# Patient Record
Sex: Female | Born: 1935 | Race: White | Hispanic: No | Smoking: Former smoker
Health system: Southern US, Community
[De-identification: ages and names within clinical notes are randomized; demographics above are authoritative.]

## PROBLEM LIST (undated history)

## (undated) DIAGNOSIS — E119 Type 2 diabetes mellitus without complications: Secondary | ICD-10-CM

## (undated) DIAGNOSIS — I739 Peripheral vascular disease, unspecified: Secondary | ICD-10-CM

## (undated) DIAGNOSIS — K219 Gastro-esophageal reflux disease without esophagitis: Secondary | ICD-10-CM

## (undated) DIAGNOSIS — G459 Transient cerebral ischemic attack, unspecified: Secondary | ICD-10-CM

## (undated) DIAGNOSIS — I10 Essential (primary) hypertension: Secondary | ICD-10-CM

## (undated) DIAGNOSIS — E785 Hyperlipidemia, unspecified: Secondary | ICD-10-CM

## (undated) DIAGNOSIS — N189 Chronic kidney disease, unspecified: Secondary | ICD-10-CM

## (undated) DIAGNOSIS — Z9989 Dependence on other enabling machines and devices: Secondary | ICD-10-CM

## (undated) DIAGNOSIS — M86671 Other chronic osteomyelitis, right ankle and foot: Secondary | ICD-10-CM

## (undated) DIAGNOSIS — G2581 Restless legs syndrome: Secondary | ICD-10-CM

## (undated) DIAGNOSIS — D509 Iron deficiency anemia, unspecified: Secondary | ICD-10-CM

## (undated) DIAGNOSIS — E559 Vitamin D deficiency, unspecified: Secondary | ICD-10-CM

## (undated) HISTORY — DX: Other chronic osteomyelitis, right ankle and foot: M86.671

## (undated) HISTORY — PX: ABDOMINAL HYSTERECTOMY: SHX81

## (undated) HISTORY — PX: ROTATOR CUFF REPAIR: SHX139

## (undated) HISTORY — PX: OTHER SURGICAL HISTORY: SHX169

## (undated) HISTORY — PX: CARPAL TUNNEL RELEASE: SHX101

## (undated) HISTORY — PX: DILATION AND CURETTAGE OF UTERUS: SHX78

## (undated) HISTORY — PX: COLONOSCOPY: SHX174

## (undated) HISTORY — PX: CERVICAL DISC SURGERY: SHX588

## (undated) HISTORY — PX: APPENDECTOMY: SHX54

## (undated) HISTORY — PX: CATARACT EXTRACTION, BILATERAL: SHX1313

---

## 2016-05-08 DIAGNOSIS — K219 Gastro-esophageal reflux disease without esophagitis: Secondary | ICD-10-CM | POA: Diagnosis not present

## 2016-05-08 DIAGNOSIS — G2581 Restless legs syndrome: Secondary | ICD-10-CM | POA: Diagnosis not present

## 2016-05-08 DIAGNOSIS — D509 Iron deficiency anemia, unspecified: Secondary | ICD-10-CM | POA: Diagnosis not present

## 2016-05-08 DIAGNOSIS — I10 Essential (primary) hypertension: Secondary | ICD-10-CM | POA: Diagnosis not present

## 2016-05-08 DIAGNOSIS — E1149 Type 2 diabetes mellitus with other diabetic neurological complication: Secondary | ICD-10-CM | POA: Diagnosis not present

## 2016-05-08 DIAGNOSIS — E785 Hyperlipidemia, unspecified: Secondary | ICD-10-CM | POA: Diagnosis not present

## 2016-05-08 DIAGNOSIS — I739 Peripheral vascular disease, unspecified: Secondary | ICD-10-CM | POA: Diagnosis not present

## 2016-05-13 DIAGNOSIS — E1042 Type 1 diabetes mellitus with diabetic polyneuropathy: Secondary | ICD-10-CM | POA: Diagnosis not present

## 2016-06-13 DIAGNOSIS — I129 Hypertensive chronic kidney disease with stage 1 through stage 4 chronic kidney disease, or unspecified chronic kidney disease: Secondary | ICD-10-CM | POA: Diagnosis not present

## 2016-06-13 DIAGNOSIS — E1151 Type 2 diabetes mellitus with diabetic peripheral angiopathy without gangrene: Secondary | ICD-10-CM | POA: Diagnosis not present

## 2016-06-13 DIAGNOSIS — E1140 Type 2 diabetes mellitus with diabetic neuropathy, unspecified: Secondary | ICD-10-CM | POA: Diagnosis not present

## 2016-06-13 DIAGNOSIS — E11621 Type 2 diabetes mellitus with foot ulcer: Secondary | ICD-10-CM | POA: Diagnosis not present

## 2016-06-13 DIAGNOSIS — L97521 Non-pressure chronic ulcer of other part of left foot limited to breakdown of skin: Secondary | ICD-10-CM | POA: Diagnosis not present

## 2016-06-16 DIAGNOSIS — L97521 Non-pressure chronic ulcer of other part of left foot limited to breakdown of skin: Secondary | ICD-10-CM | POA: Diagnosis not present

## 2016-06-16 DIAGNOSIS — E11621 Type 2 diabetes mellitus with foot ulcer: Secondary | ICD-10-CM | POA: Diagnosis not present

## 2016-06-16 DIAGNOSIS — E1140 Type 2 diabetes mellitus with diabetic neuropathy, unspecified: Secondary | ICD-10-CM | POA: Diagnosis not present

## 2016-06-16 DIAGNOSIS — I129 Hypertensive chronic kidney disease with stage 1 through stage 4 chronic kidney disease, or unspecified chronic kidney disease: Secondary | ICD-10-CM | POA: Diagnosis not present

## 2016-06-16 DIAGNOSIS — E1151 Type 2 diabetes mellitus with diabetic peripheral angiopathy without gangrene: Secondary | ICD-10-CM | POA: Diagnosis not present

## 2016-06-17 DIAGNOSIS — E1149 Type 2 diabetes mellitus with other diabetic neurological complication: Secondary | ICD-10-CM | POA: Diagnosis not present

## 2016-06-17 DIAGNOSIS — N189 Chronic kidney disease, unspecified: Secondary | ICD-10-CM | POA: Diagnosis not present

## 2016-06-17 DIAGNOSIS — G629 Polyneuropathy, unspecified: Secondary | ICD-10-CM | POA: Diagnosis not present

## 2016-06-17 DIAGNOSIS — Z79899 Other long term (current) drug therapy: Secondary | ICD-10-CM | POA: Diagnosis not present

## 2016-06-17 DIAGNOSIS — R296 Repeated falls: Secondary | ICD-10-CM | POA: Diagnosis not present

## 2016-06-17 DIAGNOSIS — L98499 Non-pressure chronic ulcer of skin of other sites with unspecified severity: Secondary | ICD-10-CM | POA: Diagnosis not present

## 2016-06-17 DIAGNOSIS — E785 Hyperlipidemia, unspecified: Secondary | ICD-10-CM | POA: Diagnosis not present

## 2016-06-19 DIAGNOSIS — E1140 Type 2 diabetes mellitus with diabetic neuropathy, unspecified: Secondary | ICD-10-CM | POA: Diagnosis not present

## 2016-06-19 DIAGNOSIS — L97521 Non-pressure chronic ulcer of other part of left foot limited to breakdown of skin: Secondary | ICD-10-CM | POA: Diagnosis not present

## 2016-06-19 DIAGNOSIS — I129 Hypertensive chronic kidney disease with stage 1 through stage 4 chronic kidney disease, or unspecified chronic kidney disease: Secondary | ICD-10-CM | POA: Diagnosis not present

## 2016-06-19 DIAGNOSIS — E1151 Type 2 diabetes mellitus with diabetic peripheral angiopathy without gangrene: Secondary | ICD-10-CM | POA: Diagnosis not present

## 2016-06-19 DIAGNOSIS — E11621 Type 2 diabetes mellitus with foot ulcer: Secondary | ICD-10-CM | POA: Diagnosis not present

## 2016-06-20 DIAGNOSIS — E1151 Type 2 diabetes mellitus with diabetic peripheral angiopathy without gangrene: Secondary | ICD-10-CM | POA: Diagnosis not present

## 2016-06-20 DIAGNOSIS — I129 Hypertensive chronic kidney disease with stage 1 through stage 4 chronic kidney disease, or unspecified chronic kidney disease: Secondary | ICD-10-CM | POA: Diagnosis not present

## 2016-06-20 DIAGNOSIS — E11621 Type 2 diabetes mellitus with foot ulcer: Secondary | ICD-10-CM | POA: Diagnosis not present

## 2016-06-20 DIAGNOSIS — L97521 Non-pressure chronic ulcer of other part of left foot limited to breakdown of skin: Secondary | ICD-10-CM | POA: Diagnosis not present

## 2016-06-20 DIAGNOSIS — E1140 Type 2 diabetes mellitus with diabetic neuropathy, unspecified: Secondary | ICD-10-CM | POA: Diagnosis not present

## 2016-06-23 DIAGNOSIS — L97521 Non-pressure chronic ulcer of other part of left foot limited to breakdown of skin: Secondary | ICD-10-CM | POA: Diagnosis not present

## 2016-06-23 DIAGNOSIS — E11621 Type 2 diabetes mellitus with foot ulcer: Secondary | ICD-10-CM | POA: Diagnosis not present

## 2016-06-23 DIAGNOSIS — E1151 Type 2 diabetes mellitus with diabetic peripheral angiopathy without gangrene: Secondary | ICD-10-CM | POA: Diagnosis not present

## 2016-06-23 DIAGNOSIS — E1140 Type 2 diabetes mellitus with diabetic neuropathy, unspecified: Secondary | ICD-10-CM | POA: Diagnosis not present

## 2016-06-23 DIAGNOSIS — I129 Hypertensive chronic kidney disease with stage 1 through stage 4 chronic kidney disease, or unspecified chronic kidney disease: Secondary | ICD-10-CM | POA: Diagnosis not present

## 2016-06-24 DIAGNOSIS — E1140 Type 2 diabetes mellitus with diabetic neuropathy, unspecified: Secondary | ICD-10-CM | POA: Diagnosis not present

## 2016-06-24 DIAGNOSIS — I129 Hypertensive chronic kidney disease with stage 1 through stage 4 chronic kidney disease, or unspecified chronic kidney disease: Secondary | ICD-10-CM | POA: Diagnosis not present

## 2016-06-24 DIAGNOSIS — E11621 Type 2 diabetes mellitus with foot ulcer: Secondary | ICD-10-CM | POA: Diagnosis not present

## 2016-06-24 DIAGNOSIS — E1151 Type 2 diabetes mellitus with diabetic peripheral angiopathy without gangrene: Secondary | ICD-10-CM | POA: Diagnosis not present

## 2016-06-24 DIAGNOSIS — L97521 Non-pressure chronic ulcer of other part of left foot limited to breakdown of skin: Secondary | ICD-10-CM | POA: Diagnosis not present

## 2016-06-26 DIAGNOSIS — M62838 Other muscle spasm: Secondary | ICD-10-CM | POA: Diagnosis not present

## 2016-06-26 DIAGNOSIS — I10 Essential (primary) hypertension: Secondary | ICD-10-CM | POA: Diagnosis not present

## 2016-06-26 DIAGNOSIS — L84 Corns and callosities: Secondary | ICD-10-CM | POA: Diagnosis not present

## 2016-06-27 DIAGNOSIS — E1151 Type 2 diabetes mellitus with diabetic peripheral angiopathy without gangrene: Secondary | ICD-10-CM | POA: Diagnosis not present

## 2016-06-27 DIAGNOSIS — L97521 Non-pressure chronic ulcer of other part of left foot limited to breakdown of skin: Secondary | ICD-10-CM | POA: Diagnosis not present

## 2016-06-27 DIAGNOSIS — E11621 Type 2 diabetes mellitus with foot ulcer: Secondary | ICD-10-CM | POA: Diagnosis not present

## 2016-06-27 DIAGNOSIS — E1140 Type 2 diabetes mellitus with diabetic neuropathy, unspecified: Secondary | ICD-10-CM | POA: Diagnosis not present

## 2016-06-27 DIAGNOSIS — I129 Hypertensive chronic kidney disease with stage 1 through stage 4 chronic kidney disease, or unspecified chronic kidney disease: Secondary | ICD-10-CM | POA: Diagnosis not present

## 2016-07-01 ENCOUNTER — Inpatient Hospital Stay (HOSPITAL_COMMUNITY)
Admission: EM | Admit: 2016-07-01 | Discharge: 2016-07-04 | DRG: 816 | Disposition: A | Discharge status: Home / Self Care | Payer: Commercial Managed Care - HMO | Attending: Internal Medicine | Admitting: Internal Medicine

## 2016-07-01 ENCOUNTER — Emergency Department (HOSPITAL_COMMUNITY): Payer: Commercial Managed Care - HMO

## 2016-07-01 ENCOUNTER — Encounter (HOSPITAL_COMMUNITY): Payer: Self-pay

## 2016-07-01 DIAGNOSIS — Z7982 Long term (current) use of aspirin: Secondary | ICD-10-CM

## 2016-07-01 DIAGNOSIS — R221 Localized swelling, mass and lump, neck: Secondary | ICD-10-CM | POA: Diagnosis not present

## 2016-07-01 DIAGNOSIS — K219 Gastro-esophageal reflux disease without esophagitis: Secondary | ICD-10-CM | POA: Diagnosis present

## 2016-07-01 DIAGNOSIS — E1151 Type 2 diabetes mellitus with diabetic peripheral angiopathy without gangrene: Secondary | ICD-10-CM | POA: Diagnosis present

## 2016-07-01 DIAGNOSIS — Z87891 Personal history of nicotine dependence: Secondary | ICD-10-CM | POA: Diagnosis not present

## 2016-07-01 DIAGNOSIS — N189 Chronic kidney disease, unspecified: Secondary | ICD-10-CM | POA: Diagnosis present

## 2016-07-01 DIAGNOSIS — Z794 Long term (current) use of insulin: Secondary | ICD-10-CM

## 2016-07-01 DIAGNOSIS — Z89412 Acquired absence of left great toe: Secondary | ICD-10-CM

## 2016-07-01 DIAGNOSIS — E785 Hyperlipidemia, unspecified: Secondary | ICD-10-CM | POA: Diagnosis not present

## 2016-07-01 DIAGNOSIS — R591 Generalized enlarged lymph nodes: Secondary | ICD-10-CM

## 2016-07-01 DIAGNOSIS — I10 Essential (primary) hypertension: Secondary | ICD-10-CM | POA: Diagnosis not present

## 2016-07-01 DIAGNOSIS — Z9842 Cataract extraction status, left eye: Secondary | ICD-10-CM | POA: Diagnosis not present

## 2016-07-01 DIAGNOSIS — Z8673 Personal history of transient ischemic attack (TIA), and cerebral infarction without residual deficits: Secondary | ICD-10-CM

## 2016-07-01 DIAGNOSIS — I129 Hypertensive chronic kidney disease with stage 1 through stage 4 chronic kidney disease, or unspecified chronic kidney disease: Secondary | ICD-10-CM | POA: Diagnosis present

## 2016-07-01 DIAGNOSIS — E1122 Type 2 diabetes mellitus with diabetic chronic kidney disease: Secondary | ICD-10-CM | POA: Diagnosis not present

## 2016-07-01 DIAGNOSIS — Z9841 Cataract extraction status, right eye: Secondary | ICD-10-CM

## 2016-07-01 DIAGNOSIS — R030 Elevated blood-pressure reading, without diagnosis of hypertension: Secondary | ICD-10-CM | POA: Diagnosis not present

## 2016-07-01 DIAGNOSIS — G2581 Restless legs syndrome: Secondary | ICD-10-CM | POA: Diagnosis not present

## 2016-07-01 DIAGNOSIS — Z66 Do not resuscitate: Secondary | ICD-10-CM | POA: Diagnosis present

## 2016-07-01 DIAGNOSIS — I889 Nonspecific lymphadenitis, unspecified: Principal | ICD-10-CM | POA: Diagnosis present

## 2016-07-01 DIAGNOSIS — K1120 Sialoadenitis, unspecified: Secondary | ICD-10-CM | POA: Diagnosis not present

## 2016-07-01 DIAGNOSIS — Z79899 Other long term (current) drug therapy: Secondary | ICD-10-CM

## 2016-07-01 DIAGNOSIS — E119 Type 2 diabetes mellitus without complications: Secondary | ICD-10-CM

## 2016-07-01 DIAGNOSIS — R590 Localized enlarged lymph nodes: Secondary | ICD-10-CM | POA: Diagnosis not present

## 2016-07-01 DIAGNOSIS — R229 Localized swelling, mass and lump, unspecified: Secondary | ICD-10-CM | POA: Diagnosis not present

## 2016-07-01 HISTORY — DX: Vitamin D deficiency, unspecified: E55.9

## 2016-07-01 HISTORY — DX: Restless legs syndrome: G25.81

## 2016-07-01 HISTORY — DX: Iron deficiency anemia, unspecified: D50.9

## 2016-07-01 HISTORY — DX: Chronic kidney disease, unspecified: N18.9

## 2016-07-01 HISTORY — DX: Transient cerebral ischemic attack, unspecified: G45.9

## 2016-07-01 HISTORY — DX: Gastro-esophageal reflux disease without esophagitis: K21.9

## 2016-07-01 HISTORY — DX: Type 2 diabetes mellitus without complications: E11.9

## 2016-07-01 HISTORY — DX: Hyperlipidemia, unspecified: E78.5

## 2016-07-01 HISTORY — DX: Essential (primary) hypertension: I10

## 2016-07-01 HISTORY — DX: Peripheral vascular disease, unspecified: I73.9

## 2016-07-01 HISTORY — DX: Dependence on other enabling machines and devices: Z99.89

## 2016-07-01 LAB — CBC WITH DIFFERENTIAL/PLATELET
Basophils Absolute: 0 10*3/uL (ref 0.0–0.1)
Basophils Relative: 0 %
Eosinophils Absolute: 0.1 10*3/uL (ref 0.0–0.7)
Eosinophils Relative: 1 %
HEMATOCRIT: 37.2 % (ref 36.0–46.0)
HEMOGLOBIN: 12.4 g/dL (ref 12.0–15.0)
LYMPHS ABS: 2.1 10*3/uL (ref 0.7–4.0)
Lymphocytes Relative: 20 %
MCH: 30 pg (ref 26.0–34.0)
MCHC: 33.3 g/dL (ref 30.0–36.0)
MCV: 90.1 fL (ref 78.0–100.0)
MONOS PCT: 9 %
Monocytes Absolute: 0.9 10*3/uL (ref 0.1–1.0)
NEUTROS ABS: 7.2 10*3/uL (ref 1.7–7.7)
NEUTROS PCT: 70 %
Platelets: 226 10*3/uL (ref 150–400)
RBC: 4.13 MIL/uL (ref 3.87–5.11)
RDW: 13.1 % (ref 11.5–15.5)
WBC: 10.4 10*3/uL (ref 4.0–10.5)

## 2016-07-01 LAB — BASIC METABOLIC PANEL
ANION GAP: 8 (ref 5–15)
BUN: 17 mg/dL (ref 6–20)
CHLORIDE: 102 mmol/L (ref 101–111)
CO2: 27 mmol/L (ref 22–32)
Calcium: 9 mg/dL (ref 8.9–10.3)
Creatinine, Ser: 1.18 mg/dL — ABNORMAL HIGH (ref 0.44–1.00)
GFR calc non Af Amer: 43 mL/min — ABNORMAL LOW (ref >60)
GFR, EST AFRICAN AMERICAN: 50 mL/min — AB (ref >60)
Glucose, Bld: 145 mg/dL — ABNORMAL HIGH (ref 65–99)
POTASSIUM: 3.8 mmol/L (ref 3.5–5.1)
Sodium: 137 mmol/L (ref 135–145)

## 2016-07-01 LAB — GLUCOSE, CAPILLARY: GLUCOSE-CAPILLARY: 174 mg/dL — AB (ref 65–99)

## 2016-07-01 MED ORDER — METOPROLOL TARTRATE 25 MG PO TABS
25.0000 mg | ORAL_TABLET | Freq: Two times a day (BID) | ORAL | Status: DC
  Administered 2016-07-02 – 2016-07-03 (×4): 25 mg via ORAL
  Filled 2016-07-01 (×4): qty 1

## 2016-07-01 MED ORDER — NAFCILLIN SODIUM 1 G IJ SOLR
1.5000 g | INTRAMUSCULAR | Status: DC
  Filled 2016-07-01 (×3): qty 2000

## 2016-07-01 MED ORDER — NAFCILLIN SODIUM 1 G IJ SOLR
1500.0000 mg | INTRAVENOUS | Status: DC
  Administered 2016-07-02 (×3): 1500 mg via INTRAVENOUS
  Filled 2016-07-01 (×7): qty 1500

## 2016-07-01 MED ORDER — OMEGA-3-ACID ETHYL ESTERS 1 G PO CAPS
1000.0000 mg | ORAL_CAPSULE | Freq: Three times a day (TID) | ORAL | Status: DC
  Administered 2016-07-02 – 2016-07-04 (×9): 1000 mg via ORAL
  Filled 2016-07-01 (×9): qty 1

## 2016-07-01 MED ORDER — SODIUM CHLORIDE 0.9 % IV SOLN
INTRAVENOUS | Status: DC
  Administered 2016-07-02 – 2016-07-04 (×2): via INTRAVENOUS

## 2016-07-01 MED ORDER — HYDROCODONE-ACETAMINOPHEN 5-325 MG PO TABS
1.0000 | ORAL_TABLET | ORAL | Status: DC | PRN
  Administered 2016-07-03 – 2016-07-04 (×2): 2 via ORAL
  Filled 2016-07-01 (×2): qty 2

## 2016-07-01 MED ORDER — METRONIDAZOLE IN NACL 5-0.79 MG/ML-% IV SOLN
500.0000 mg | Freq: Once | INTRAVENOUS | Status: AC
  Administered 2016-07-01: 500 mg via INTRAVENOUS
  Filled 2016-07-01: qty 100

## 2016-07-01 MED ORDER — METRONIDAZOLE IN NACL 5-0.79 MG/ML-% IV SOLN
500.0000 mg | Freq: Three times a day (TID) | INTRAVENOUS | Status: DC
  Administered 2016-07-02 (×2): 500 mg via INTRAVENOUS
  Filled 2016-07-01 (×2): qty 100

## 2016-07-01 MED ORDER — PANTOPRAZOLE SODIUM 40 MG PO TBEC
40.0000 mg | DELAYED_RELEASE_TABLET | Freq: Every day | ORAL | Status: DC
  Administered 2016-07-02 – 2016-07-04 (×4): 40 mg via ORAL
  Filled 2016-07-01 (×4): qty 1

## 2016-07-01 MED ORDER — ROPINIROLE HCL 1 MG PO TABS
2.0000 mg | ORAL_TABLET | Freq: Three times a day (TID) | ORAL | Status: DC
  Administered 2016-07-02 – 2016-07-04 (×7): 2 mg via ORAL
  Filled 2016-07-01 (×8): qty 2

## 2016-07-01 MED ORDER — INSULIN ASPART 100 UNIT/ML ~~LOC~~ SOLN
0.0000 [IU] | Freq: Three times a day (TID) | SUBCUTANEOUS | Status: DC
  Administered 2016-07-02: 2 [IU] via SUBCUTANEOUS
  Administered 2016-07-02 – 2016-07-03 (×2): 1 [IU] via SUBCUTANEOUS

## 2016-07-01 MED ORDER — FOLIC ACID 400 MCG PO TABS: 400.0000 ug | ORAL_TABLET | Freq: Every day | ORAL | Status: DC

## 2016-07-01 MED ORDER — HEPARIN SODIUM (PORCINE) 5000 UNIT/ML IJ SOLN
5000.0000 [IU] | Freq: Three times a day (TID) | INTRAMUSCULAR | Status: DC
  Administered 2016-07-02 – 2016-07-04 (×9): 5000 [IU] via SUBCUTANEOUS
  Filled 2016-07-01 (×7): qty 1

## 2016-07-01 MED ORDER — VITAMIN D 1000 UNITS PO TABS
1000.0000 [IU] | ORAL_TABLET | Freq: Every day | ORAL | Status: DC
  Administered 2016-07-02 – 2016-07-04 (×3): 1000 [IU] via ORAL
  Filled 2016-07-01 (×5): qty 1

## 2016-07-01 MED ORDER — VITAMIN E 180 MG (400 UNIT) PO CAPS
400.0000 [IU] | ORAL_CAPSULE | Freq: Every day | ORAL | Status: DC
  Administered 2016-07-02 – 2016-07-04 (×3): 400 [IU] via ORAL
  Filled 2016-07-01 (×4): qty 1

## 2016-07-01 MED ORDER — IOPAMIDOL (ISOVUE-370) INJECTION 76%: 75.0000 mL | Freq: Once | INTRAVENOUS | Status: DC | PRN

## 2016-07-01 MED ORDER — SIMVASTATIN 40 MG PO TABS
40.0000 mg | ORAL_TABLET | Freq: Every day | ORAL | Status: DC
  Administered 2016-07-02 – 2016-07-03 (×3): 40 mg via ORAL
  Filled 2016-07-01 (×3): qty 1

## 2016-07-01 MED ORDER — LOSARTAN POTASSIUM 50 MG PO TABS
100.0000 mg | ORAL_TABLET | Freq: Every day | ORAL | Status: DC
  Administered 2016-07-02 – 2016-07-04 (×3): 100 mg via ORAL
  Filled 2016-07-01 (×4): qty 2

## 2016-07-01 MED ORDER — RISAQUAD PO CAPS
2.0000 | ORAL_CAPSULE | Freq: Every day | ORAL | Status: DC
  Administered 2016-07-02 – 2016-07-04 (×3): 2 via ORAL
  Filled 2016-07-01 (×4): qty 2

## 2016-07-01 MED ORDER — CLINDAMYCIN PHOSPHATE 600 MG/50ML IV SOLN
600.0000 mg | Freq: Once | INTRAVENOUS | Status: AC
  Administered 2016-07-01: 600 mg via INTRAVENOUS
  Filled 2016-07-01: qty 50

## 2016-07-01 MED ORDER — ROPINIROLE HCL ER 6 MG PO TB24: 6.0000 mg | ORAL_TABLET | Freq: Every day | ORAL | Status: DC

## 2016-07-01 MED ORDER — FOLIC ACID 1 MG PO TABS
0.5000 mg | ORAL_TABLET | Freq: Every day | ORAL | Status: DC
  Administered 2016-07-02 – 2016-07-04 (×3): 0.5 mg via ORAL
  Filled 2016-07-01 (×4): qty 1

## 2016-07-01 MED ORDER — FERROUS SULFATE 325 (65 FE) MG PO TABS
325.0000 mg | ORAL_TABLET | Freq: Two times a day (BID) | ORAL | Status: DC
  Administered 2016-07-02 – 2016-07-04 (×6): 325 mg via ORAL
  Filled 2016-07-01 (×6): qty 1

## 2016-07-01 MED ORDER — TRAMADOL HCL 50 MG PO TABS
50.0000 mg | ORAL_TABLET | Freq: Three times a day (TID) | ORAL | Status: DC
  Administered 2016-07-02 – 2016-07-04 (×9): 50 mg via ORAL
  Filled 2016-07-01 (×9): qty 1

## 2016-07-01 MED ORDER — IOPAMIDOL (ISOVUE-300) INJECTION 61%
75.0000 mL | Freq: Once | INTRAVENOUS | Status: AC | PRN
  Administered 2016-07-01: 60 mL via INTRAVENOUS

## 2016-07-01 NOTE — ED Notes (Signed)
Gave report to [place], Therapist, sports on [address]

## 2016-07-01 NOTE — ED Provider Notes (Signed)
[facility] DEPT Provider Note   CSN: [id number] Arrival date & time: 07/01/16  1214     History   Chief Complaint Chief Complaint  Patient presents with  . Lymphadenopathy    HPI Sara Griffith is a 80 y.o. female.  The history is provided by the patient and medical records. No language interpreter was used.   Sara Griffith is a 80 y.o. female  with a PMH of DM, HTN, CKD, prior TIA, PVD, GERD and restless leg syndrome who was sent to Emergency Department by PCP for further evaluation of left neck swelling. Patient initially noticed swelling and pain under the left aspect of her jaw yesterday. Throughout the day, symptoms worsened and the skin overlying area of pain became red. Pain is worse with head movement left/right. She took tylenol yesterday which provided adequate pain relief. She denies difficulty breathing or swallowing. No fevers, sore throat, dental pain, posterior neck pain, shortness of breath, chest pain, abdominal pain, n/v or any other associated symptoms. No history of similar sxs.   Past Medical History:  Diagnosis Date  . CKD (chronic kidney disease)   . Diabetes mellitus without complication ([place])   . GERD (gastroesophageal reflux disease)   . HLD (hyperlipidemia)   . Hypertension   . IDA (iron deficiency anemia)   . PVD (peripheral vascular disease) ([place])   . RLS (restless legs syndrome)   . TIA (transient ischemic attack)   . Uses walker   . Vitamin D deficiency     There are no active problems to display for this patient.   Past Surgical History:  Procedure Laterality Date  . ABDOMINAL HYSTERECTOMY     with bladder tac  . APPENDECTOMY    . CARPAL TUNNEL RELEASE Right   . CATARACT EXTRACTION, BILATERAL    . CERVICAL DISC SURGERY    . COLONOSCOPY    . DILATION AND CURETTAGE OF UTERUS     x 2  . left great toe amputation    . ROTATOR CUFF REPAIR Left     OB History    No data available       Home Medications    Prior to Admission  medications   Medication Sig Start Date End Date Taking? Authorizing Provider  aspirin EC 81 MG tablet Take 81 mg by mouth daily.   Yes Historical Provider, MD  cholecalciferol (VITAMIN D) 1000 units tablet Take 1,000 Units by mouth daily.   Yes Historical Provider, MD  ferrous sulfate 325 (65 FE) MG tablet Take 325 mg by mouth 2 (two) times daily. 06/28/16  Yes Historical Provider, MD  folic acid (FOLVITE) A999333 MCG tablet Take 400 mcg by mouth daily.   Yes Historical Provider, MD  losartan (COZAAR) 100 MG tablet Take 100 mg by mouth daily. 05/30/16  Yes Historical Provider, MD  metoprolol tartrate (LOPRESSOR) 25 MG tablet Take 25 mg by mouth 2 (two) times daily with a meal. 05/30/16  Yes Historical Provider, MD  omega-3 acid ethyl esters (LOVAZA) 1 g capsule Take 1,000 mg by mouth 3 (three) times daily. 06/28/16  Yes Historical Provider, MD  omeprazole (PRILOSEC) 40 MG capsule Take 40 mg by mouth daily. 06/28/16  Yes Historical Provider, MD  Ropinirole HCl 6 MG TB24 Take 6 mg by mouth daily. 05/09/16  Yes Historical Provider, MD  simvastatin (ZOCOR) 40 MG tablet Take 40 mg by mouth every evening. 05/03/16  Yes Historical Provider, MD  TRADJENTA 5 MG TABS tablet Take 5 mg by mouth daily. 06/06/16  Yes Historical Provider, MD  traMADol (ULTRAM) 50 MG tablet Take 50 mg by mouth 3 (three) times daily. 06/05/16  Yes Historical Provider, MD  vitamin E 400 UNIT capsule Take 400 Units by mouth daily.   Yes Historical Provider, MD    Family History History reviewed. No pertinent family history.  Social History Social History  Substance Use Topics  . Smoking status: Former Smoker    Years: 30.00    Types: Cigarettes  . Smokeless tobacco: Former Systems developer    Types: Snuff    Quit date: 07/01/1954  . Alcohol use No     Allergies   Cymbalta [duloxetine hcl]; Librax [chlordiazepoxide-clidinium]; Lyrica [pregabalin]; and Naproxen   Review of Systems Review of Systems  Constitutional: Negative for fever.    HENT: Negative for congestion, sore throat and trouble swallowing.   Eyes: Negative for visual disturbance.  Respiratory: Negative for cough and shortness of breath.   Cardiovascular: Negative.   Gastrointestinal: Negative for abdominal pain, nausea and vomiting.  Genitourinary: Negative for dysuria.  Musculoskeletal: Positive for neck pain.  Skin: Positive for color change. Negative for wound.  Neurological: Negative for headaches.     Physical Exam Updated Vital Signs BP 193/80   Pulse 77   Temp 98 F (36.7 C) (Oral)   Resp 16   Ht 4\' 11"  (1.499 m)   Wt 62.6 kg   LMP  (LMP Unknown)   SpO2 96%   BMI 27.87 kg/m   Physical Exam  Constitutional: She is oriented to person, place, and time. She appears well-developed and well-nourished. No distress.  HENT:  Head: Normocephalic and atraumatic.  Nose: Nose normal.  Mouth/Throat: Oropharynx is clear and moist. No oropharyngeal exudate.  Neck:    Full ROM but pain with right/left movement.   Cardiovascular: Normal rate, regular rhythm and normal heart sounds.   No murmur heard. Pulmonary/Chest: Effort normal and breath sounds normal. No respiratory distress. She has no wheezes. She has no rales. She exhibits no tenderness.  Abdominal: Soft. She exhibits no distension. There is no tenderness.  Neurological: She is alert and oriented to person, place, and time.  Skin: Skin is warm and dry.  Nursing note and vitals reviewed.    ED Treatments / Results  Labs (all labs ordered are listed, but only abnormal results are displayed) Labs Reviewed  BASIC METABOLIC PANEL - Abnormal; Notable for the following:       Result Value   Glucose, Bld 145 (*)    Creatinine, Ser 1.18 (*)    GFR calc non Af Amer 43 (*)    GFR calc Af Amer 50 (*)    All other components within normal limits  CBC WITH DIFFERENTIAL/PLATELET  QUANTIFERON TB GOLD ASSAY (BLOOD)    EKG  EKG Interpretation None       Radiology Ct Soft Tissue Neck W  Contrast  Result Date: 07/01/2016 CLINICAL DATA:  80 year old diabetic hypertensive female with swollen left-sided lymph nodes since last week. Initial encounter. EXAM: CT NECK WITH CONTRAST TECHNIQUE: Multidetector CT imaging of the neck was performed using the standard protocol following the bolus administration of intravenous contrast. CONTRAST:  51mL ISOVUE-300 IOPAMIDOL (ISOVUE-300) INJECTION 61% COMPARISON:  None. FINDINGS: Pharynx and larynx: Asymmetric appearance of the pharynx may be secondary to tortuous left carotid artery impression upon the left aspect of the posterior pharynx rather primary mass although direct visualization would be necessary to exclude mucosa abnormality. Salivary glands: At the junction of the inferior aspect of the parotid  gland and the left sternocleidomastoid muscle is a 2 cm necrotic/ inflammatory process. Difficult to determine if this represents a primary parotid mass with invasion of the sternocleidomastoid muscle versus adenopathy with invasion of the sternocleidomastoid muscle and parotid gland. Thyroid: No thyroid mass. Lymph nodes: As above. Additionally, there are scattered small lymph nodes throughout the neck bilaterally slightly greater on the left. Vascular: Negative for acute abnormality.Atherosclerotic changes aorta, great vessels and carotid arteries which are ectatic. Narrowing without No hemodynamically significant stenosis of either carotid bifurcation. Limited intracranial: Negative for acute abnormality. Visualized orbits: Negative for acute abnormality. Mastoids and visualized paranasal sinuses: Clear. Skeleton: Posterior decompression C3-C7 cervical spondylotic changes with kyphosis centered at the C5-6 level. Upper chest: Coarse calcification right lung apex suggestive of prior granulomatous exposure. Mild pulmonary vascular prominence. Other: Stranding of fat planes diffusely which may represent presence of cellulitis. IMPRESSION: At the junction of  the inferior aspect of the left parotid gland and the left sternocleidomastoid muscle is a 2 cm necrotic/ inflammatory process. Difficult to determine if this represents a primary parotid mass with invasion of the sternocleidomastoid muscle versus adenopathy (necrotic metastatic lymphadenopathy versus infectious adenopathy) with invasion of the sternocleidomastoid muscle and parotid gland. As the patient has had prior granulomatous exposure as indicated by coarse calcification right upper lobe, tuberculosis or other granulomatous process is a possibility. Stranding of fat planes lower face/ neck may represent cellulitis or third spacing of fluid. Asymmetric appearance of the pharynx may be secondary to tortuous left carotid artery impression upon the left aspect of the posterior pharynx rather primary mass although direct visualization would be necessary to exclude mucosa abnormality. Postsurgical changes cervical spine with cervical spondylotic changes. Electronically Signed   By: Genia Del M.D.   On: 07/01/2016 14:59    Procedures Procedures (including critical care time)  Medications Ordered in ED Medications  clindamycin (CLEOCIN) IVPB 600 mg (not administered)  iopamidol (ISOVUE-300) 61 % injection 75 mL (60 mLs Intravenous Contrast Given 07/01/16 1424)     Initial Impression / Assessment and Plan / ED Course  I have reviewed the triage vital signs and the nursing notes.  Pertinent labs & imaging results that were available during my care of the patient were reviewed by me and considered in my medical decision making (see chart for details).  Clinical Course   Yesena Salvesen is a 80 y.o. female who presents to ED from PCP for further evaluation of left neck swelling. On exam, patient is afebrile with patent airway, clear lungs and no respiratory complaints. No fevers/chills. She does have submandibular swelling with a firm 2x2 nodule on the left. Labs reviewed. White count wdl. CT soft  tissue reviewed:  IMPRESSION: At the junction of the inferior aspect of the left parotid gland and the left sternocleidomastoid muscle is a 2 cm necrotic/ inflammatory process. Difficult to determine if this represents a primary parotid mass with invasion of the sternocleidomastoid muscle versus adenopathy (necrotic metastatic lymphadenopathy versus infectious adenopathy) with invasion of the sternocleidomastoid muscle and parotid gland. As the patient has had prior granulomatous exposure as indicated by coarse calcification right upper lobe, tuberculosis or other granulomatous process is a possibility.  Stranding of fat planes lower face/ neck may represent cellulitis or third spacing of fluid.  Asymmetric appearance of the pharynx may be secondary to tortuous left carotid artery impression upon the left aspect of the posterior pharynx rather primary mass although direct visualization would be necessary to exclude mucosa abnormality.  Consulted ENT, Dr. Janace Hoard, who has reviewed imaging. He recommends medical admission for IV antibiotics. Will start clina. ENT will see patient in consultation. Possibly will need needle biopsy.   Hospitalist consulted who will admit.   Patient seen by and discussed with Dr. Lita Mains who agrees with treatment plan.   Final Clinical Impressions(s) / ED Diagnoses   Final diagnoses:  None    New Prescriptions New Prescriptions   No medications on file     [facility] Jeany Seville, PA-C 07/01/16 1627    Julianne Rice, MD 07/04/16 [phone]

## 2016-07-01 NOTE — ED Notes (Signed)
Contacted [facility] for transfer

## 2016-07-01 NOTE — H&P (Signed)
TRH H&P   Patient Demographics:    Sara Griffith, is a 80 y.o. female  MRN: [id number]   DOB - May 08, 1936  Admit Date - 07/01/2016  Outpatient Primary MD for the patient is No primary care provider on file.  Referring MD/NP/PA: PA Wards  Patient coming from: Home  Chief Complaint  Patient presents with  . Lymphadenopathy      HPI:    Sara Griffith  is a 80 y.o. female, With past medical history of hypertension, diabetes mellitus, GERD, restless leg, she daily, TIA, PVD, RLS, sent by her PCP to evaluate left neck swelling, patient reports she noticed symptoms last week, which went to her PCP, where she thought it was a muscle spasm, where she sent her for PT, patient reports worsening of symptoms including worsening pain, swelling, erythema, as well reports feeling feverish, pain worsened by movement, she denies any problems swallowing or breathing, no sore throat, no dental pain, no neck pain, no chest pain, no cough, no hemoptysis. - in ED CT neck with contrast was obtained, significant for necrotizing mass between prostatic gland and sternocleidomastoid, possibly lymph node versus peritoneal infection, ED discussed with ENT Dr. Janace Hoard, who requested patient to be transferred to [facility] for further ENT evaluation and workup.    Review of systems:    In addition to the HPI above,  Reports feeling feverish last week. No Headache, No changes with Vision or hearing, No problems swallowing food or Liquids, No Chest pain, Cough or Shortness of Breath, No Abdominal pain, No Nausea or Vommitting, Bowel movements are regular, No Blood in stool or Urine, No dysuria, Reports left neck erythema, swelling and pain No new joints pains-aches,  No new weakness, tingling, numbness in any extremity, No recent weight gain or loss, No polyuria, polydypsia or polyphagia, No significant Mental  Stressors.  A full 10 point Review of Systems was done, except as stated above, all other Review of Systems were negative.   With Past History of the following :    Past Medical History:  Diagnosis Date  . CKD (chronic kidney disease)   . Diabetes mellitus without complication ([place])   . GERD (gastroesophageal reflux disease)   . HLD (hyperlipidemia)   . Hypertension   . IDA (iron deficiency anemia)   . PVD (peripheral vascular disease) ([place])   . RLS (restless legs syndrome)   . TIA (transient ischemic attack)   . Uses walker   . Vitamin D deficiency       Past Surgical History:  Procedure Laterality Date  . ABDOMINAL HYSTERECTOMY     with bladder tac  . APPENDECTOMY    . CARPAL TUNNEL RELEASE Right   . CATARACT EXTRACTION, BILATERAL    . CERVICAL DISC SURGERY    . COLONOSCOPY    . DILATION AND CURETTAGE OF UTERUS     x 2  . left  great toe amputation    . ROTATOR CUFF REPAIR Left       Social History:     Social History  Substance Use Topics  . Smoking status: Former Smoker    Years: 30.00    Types: Cigarettes  . Smokeless tobacco: Former Systems developer    Types: Snuff    Quit date: 07/01/1954  . Alcohol use No     Lives - Home  Mobility - with walker     Family History :    History reviewed. No pertinent family history.    Home Medications:   Prior to Admission medications   Medication Sig Start Date End Date Taking? Authorizing Provider  aspirin EC 81 MG tablet Take 81 mg by mouth daily.   Yes Historical Provider, MD  cholecalciferol (VITAMIN D) 1000 units tablet Take 1,000 Units by mouth daily.   Yes Historical Provider, MD  ferrous sulfate 325 (65 FE) MG tablet Take 325 mg by mouth 2 (two) times daily. 06/28/16  Yes Historical Provider, MD  folic acid (FOLVITE) A999333 MCG tablet Take 400 mcg by mouth daily.   Yes Historical Provider, MD  losartan (COZAAR) 100 MG tablet Take 100 mg by mouth daily. 05/30/16  Yes Historical Provider, MD  metoprolol tartrate  (LOPRESSOR) 25 MG tablet Take 25 mg by mouth 2 (two) times daily with a meal. 05/30/16  Yes Historical Provider, MD  omega-3 acid ethyl esters (LOVAZA) 1 g capsule Take 1,000 mg by mouth 3 (three) times daily. 06/28/16  Yes Historical Provider, MD  omeprazole (PRILOSEC) 40 MG capsule Take 40 mg by mouth daily. 06/28/16  Yes Historical Provider, MD  OVER THE COUNTER MEDICATION Place 1 drop into both eyes as needed (dry eyes). Over the counter dry eye drops.   Yes Historical Provider, MD  Ropinirole HCl 6 MG TB24 Take 6 mg by mouth daily. 05/09/16  Yes Historical Provider, MD  simvastatin (ZOCOR) 40 MG tablet Take 40 mg by mouth every evening. 05/03/16  Yes Historical Provider, MD  TRADJENTA 5 MG TABS tablet Take 5 mg by mouth daily. 06/06/16  Yes Historical Provider, MD  traMADol (ULTRAM) 50 MG tablet Take 50 mg by mouth 3 (three) times daily. 06/05/16  Yes Historical Provider, MD  vitamin E 400 UNIT capsule Take 400 Units by mouth daily.   Yes Historical Provider, MD     Allergies:     Allergies  Allergen Reactions  . Cymbalta [Duloxetine Hcl] Swelling  . Librax [Chlordiazepoxide-Clidinium] Swelling  . Lyrica [Pregabalin] Swelling  . Naproxen Swelling     Physical Exam:   Vitals  Blood pressure 193/80, pulse 77, temperature 98 F (36.7 C), temperature source Oral, resp. rate 16, height 4\' 11"  (1.499 m), weight 62.6 kg (138 lb), SpO2 96 %.   1. General Elderly female lying in bed in NAD,    2. Normal affect and insight, Not Suicidal or Homicidal, Awake Alert, Oriented X 3.  3. No F.N deficits, ALL C.Nerves Intact, Strength 5/5 all 4 extremities, Sensation intact all 4 extremities, Plantars down going.  4. Ears and Eyes appear Normal, Conjunctivae clear, PERRLA. Moist Oral Mucosa.  5. Patient with left upper neck swelling, significantly tender to palpation warmth and erythema, No Carotid Bruits.  6. Symmetrical Chest wall movement, Good air movement bilaterally, CTAB.  7. RRR, No  Gallops, Rubs or Murmurs, No Parasternal Heave, +1 edema bilaterally  8. Positive Bowel Sounds, Abdomen Soft, No tenderness, No organomegaly appriciated,No rebound -guarding or rigidity.  9.  No Cyanosis, Normal Skin Turgor, No Skin Rash or Bruise.  10. Good muscle tone,  joints appear normal , no effusions, Normal ROM.     Data Review:    CBC  Recent Labs Lab 07/01/16 1302  WBC 10.4  HGB 12.4  HCT 37.2  PLT 226  MCV 90.1  MCH 30.0  MCHC 33.3  RDW 13.1  LYMPHSABS 2.1  MONOABS 0.9  EOSABS 0.1  BASOSABS 0.0   ------------------------------------------------------------------------------------------------------------------  Chemistries   Recent Labs Lab 07/01/16 1302  NA 137  K 3.8  CL 102  CO2 27  GLUCOSE 145*  BUN 17  CREATININE 1.18*  CALCIUM 9.0   ------------------------------------------------------------------------------------------------------------------ estimated creatinine clearance is 31.6 mL/min (by C-G formula based on SCr of 1.18 mg/dL (H)). ------------------------------------------------------------------------------------------------------------------ No results for input(s): TSH, T4TOTAL, T3FREE, THYROIDAB in the last 72 hours.  Invalid input(s): FREET3  Coagulation profile No results for input(s): INR, PROTIME in the last 168 hours. ------------------------------------------------------------------------------------------------------------------- No results for input(s): DDIMER in the last 72 hours. -------------------------------------------------------------------------------------------------------------------  Cardiac Enzymes No results for input(s): CKMB, TROPONINI, MYOGLOBIN in the last 168 hours.  Invalid input(s): CK ------------------------------------------------------------------------------------------------------------------ No results found for:  BNP   ---------------------------------------------------------------------------------------------------------------  Urinalysis No results found for: COLORURINE, APPEARANCEUR, LABSPEC, PHURINE, GLUCOSEU, HGBUR, BILIRUBINUR, KETONESUR, PROTEINUR, UROBILINOGEN, NITRITE, LEUKOCYTESUR  ----------------------------------------------------------------------------------------------------------------   Imaging Results:    Ct Soft Tissue Neck W Contrast  Result Date: 07/01/2016 CLINICAL DATA:  80 year old diabetic hypertensive female with swollen left-sided lymph nodes since last week. Initial encounter. EXAM: CT NECK WITH CONTRAST TECHNIQUE: Multidetector CT imaging of the neck was performed using the standard protocol following the bolus administration of intravenous contrast. CONTRAST:  57mL ISOVUE-300 IOPAMIDOL (ISOVUE-300) INJECTION 61% COMPARISON:  None. FINDINGS: Pharynx and larynx: Asymmetric appearance of the pharynx may be secondary to tortuous left carotid artery impression upon the left aspect of the posterior pharynx rather primary mass although direct visualization would be necessary to exclude mucosa abnormality. Salivary glands: At the junction of the inferior aspect of the parotid gland and the left sternocleidomastoid muscle is a 2 cm necrotic/ inflammatory process. Difficult to determine if this represents a primary parotid mass with invasion of the sternocleidomastoid muscle versus adenopathy with invasion of the sternocleidomastoid muscle and parotid gland. Thyroid: No thyroid mass. Lymph nodes: As above. Additionally, there are scattered small lymph nodes throughout the neck bilaterally slightly greater on the left. Vascular: Negative for acute abnormality.Atherosclerotic changes aorta, great vessels and carotid arteries which are ectatic. Narrowing without No hemodynamically significant stenosis of either carotid bifurcation. Limited intracranial: Negative for acute abnormality.  Visualized orbits: Negative for acute abnormality. Mastoids and visualized paranasal sinuses: Clear. Skeleton: Posterior decompression C3-C7 cervical spondylotic changes with kyphosis centered at the C5-6 level. Upper chest: Coarse calcification right lung apex suggestive of prior granulomatous exposure. Mild pulmonary vascular prominence. Other: Stranding of fat planes diffusely which may represent presence of cellulitis. IMPRESSION: At the junction of the inferior aspect of the left parotid gland and the left sternocleidomastoid muscle is a 2 cm necrotic/ inflammatory process. Difficult to determine if this represents a primary parotid mass with invasion of the sternocleidomastoid muscle versus adenopathy (necrotic metastatic lymphadenopathy versus infectious adenopathy) with invasion of the sternocleidomastoid muscle and parotid gland. As the patient has had prior granulomatous exposure as indicated by coarse calcification right upper lobe, tuberculosis or other granulomatous process is a possibility. Stranding of fat planes lower face/ neck may represent cellulitis or third spacing of fluid. Asymmetric appearance of the pharynx may be secondary  to tortuous left carotid artery impression upon the left aspect of the posterior pharynx rather primary mass although direct visualization would be necessary to exclude mucosa abnormality. Postsurgical changes cervical spine with cervical spondylotic changes. Electronically Signed   By: Genia Del M.D.   On: 07/01/2016 14:59       Assessment & Plan:    Principal Problem:   Cervical lymphadenitis Active Problems:   RLS (restless legs syndrome)   Diabetes mellitus (HCC)   GERD (gastroesophageal reflux disease)   HLD (hyperlipidemia)   HTN (hypertension)  Left neck pain and swelling  - secondary to infectious versus metastatic lymphadenopathy, versus parotitis - Patient will be admitted to [facility], to be seen by ENT Dr. Janace Hoard - We'll start  on IVinsulin and Flagyl to cover for infectious process including proctitis/lymphadenitis - Further workup per ENT, may need biopsy - Patient with evidence of right upper lung granulomatous process, so this might be granulomatous adenopathy, will obtain QuantiFERON gold to rule out TB, especially she reports history of husband in multiple family members treated for TB.  Diabetes mellitus - Hold oral hypoglycemic agent and continue with insulin sliding scale  Hypertension - Continue with losartan and metoprolol  Hyperlipidemia - Continue with fish oil and simvastatin  GERD -Continue with PPI  History of TIA and PVD - Continue with aspirin and statin  DVT Prophylaxis Heparin -SCDs   AM Labs Ordered, also please review Full Orders  Family Communication: Admission, patients condition and plan of care including tests being ordered have been discussed with the patient and Daughter who indicate understanding and agree with the plan and Code Status.  Code Status DNR,Confirmed by patient and daughter at bedside  Likely DC to  Home  Condition GUARDED    Consults called: ED called ENT Dr Vassie Loll  Admission status: Observation  Time spent in minutes : 55 minutes   Clinton Dragone M.D on 07/01/2016 at 4:56 PM  Between 7am to 7pm - Pager - [phone]. After 7pm go to [email or web address] - password [facility]  [facility] - Office  [phone]

## 2016-07-01 NOTE — ED Notes (Signed)
Pt's daughter Darlyne Russian gave her phone number so she can be contacted about mother's care/transfer to [facility]. [phone]

## 2016-07-01 NOTE — ED Triage Notes (Signed)
Pt is a resident of [place].  She has noticed swollen LT sided lymph nodes to her neck since last Thursday.  She was transported to [facility] today and was sent her here for further eval.  EMS VS: 172/84, 64, 16, 97% RA, CBG 175.  EMS reports she is AxOx4 and she hasn't had her meds today and hasn't eaten anything today.

## 2016-07-02 DIAGNOSIS — Z794 Long term (current) use of insulin: Secondary | ICD-10-CM | POA: Diagnosis not present

## 2016-07-02 DIAGNOSIS — Z9841 Cataract extraction status, right eye: Secondary | ICD-10-CM | POA: Diagnosis not present

## 2016-07-02 DIAGNOSIS — Z8673 Personal history of transient ischemic attack (TIA), and cerebral infarction without residual deficits: Secondary | ICD-10-CM | POA: Diagnosis not present

## 2016-07-02 DIAGNOSIS — Z66 Do not resuscitate: Secondary | ICD-10-CM | POA: Diagnosis present

## 2016-07-02 DIAGNOSIS — Z89412 Acquired absence of left great toe: Secondary | ICD-10-CM | POA: Diagnosis not present

## 2016-07-02 DIAGNOSIS — I10 Essential (primary) hypertension: Secondary | ICD-10-CM | POA: Diagnosis not present

## 2016-07-02 DIAGNOSIS — E1151 Type 2 diabetes mellitus with diabetic peripheral angiopathy without gangrene: Secondary | ICD-10-CM | POA: Diagnosis present

## 2016-07-02 DIAGNOSIS — Z7982 Long term (current) use of aspirin: Secondary | ICD-10-CM | POA: Diagnosis not present

## 2016-07-02 DIAGNOSIS — E785 Hyperlipidemia, unspecified: Secondary | ICD-10-CM | POA: Diagnosis present

## 2016-07-02 DIAGNOSIS — Z87891 Personal history of nicotine dependence: Secondary | ICD-10-CM | POA: Diagnosis not present

## 2016-07-02 DIAGNOSIS — I889 Nonspecific lymphadenitis, unspecified: Secondary | ICD-10-CM | POA: Diagnosis present

## 2016-07-02 DIAGNOSIS — K219 Gastro-esophageal reflux disease without esophagitis: Secondary | ICD-10-CM | POA: Diagnosis present

## 2016-07-02 DIAGNOSIS — E119 Type 2 diabetes mellitus without complications: Secondary | ICD-10-CM | POA: Diagnosis not present

## 2016-07-02 DIAGNOSIS — Z79899 Other long term (current) drug therapy: Secondary | ICD-10-CM | POA: Diagnosis not present

## 2016-07-02 DIAGNOSIS — R221 Localized swelling, mass and lump, neck: Secondary | ICD-10-CM | POA: Diagnosis present

## 2016-07-02 DIAGNOSIS — Z9842 Cataract extraction status, left eye: Secondary | ICD-10-CM | POA: Diagnosis not present

## 2016-07-02 DIAGNOSIS — G2581 Restless legs syndrome: Secondary | ICD-10-CM | POA: Diagnosis present

## 2016-07-02 DIAGNOSIS — I129 Hypertensive chronic kidney disease with stage 1 through stage 4 chronic kidney disease, or unspecified chronic kidney disease: Secondary | ICD-10-CM | POA: Diagnosis present

## 2016-07-02 DIAGNOSIS — E1122 Type 2 diabetes mellitus with diabetic chronic kidney disease: Secondary | ICD-10-CM | POA: Diagnosis present

## 2016-07-02 DIAGNOSIS — N189 Chronic kidney disease, unspecified: Secondary | ICD-10-CM | POA: Diagnosis present

## 2016-07-02 LAB — GLUCOSE, CAPILLARY
GLUCOSE-CAPILLARY: 136 mg/dL — AB (ref 65–99)
GLUCOSE-CAPILLARY: 117 mg/dL — AB (ref 65–99)
Glucose-Capillary: 181 mg/dL — ABNORMAL HIGH (ref 65–99)
Glucose-Capillary: 127 mg/dL — ABNORMAL HIGH (ref 65–99)

## 2016-07-02 MED ORDER — SODIUM CHLORIDE 0.9 % IV SOLN
3.0000 g | Freq: Four times a day (QID) | INTRAVENOUS | Status: DC
  Administered 2016-07-02 – 2016-07-04 (×8): 3 g via INTRAVENOUS
  Filled 2016-07-02 (×10): qty 3

## 2016-07-02 MED ORDER — INSULIN ASPART 100 UNIT/ML ~~LOC~~ SOLN
0.0000 [IU] | Freq: Three times a day (TID) | SUBCUTANEOUS | Status: DC
  Administered 2016-07-03: 3 [IU] via SUBCUTANEOUS
  Administered 2016-07-03 – 2016-07-04 (×2): 1 [IU] via SUBCUTANEOUS
  Administered 2016-07-04: 3 [IU] via SUBCUTANEOUS

## 2016-07-02 NOTE — Care Management Obs Status (Signed)
[facility] NOTIFICATION   Patient Details  Name: Rosaria Thomsen MRN: [id number] Date of Birth: 1936/05/16   Medicare Observation Status Notification Given:  Yes    Lazariah Savard, Rory Percy, RN 07/02/2016, 3:31 PM

## 2016-07-02 NOTE — Progress Notes (Signed)
PROGRESS NOTE    Sara Griffith  [id number] DOB: 03-Sep-1935 DOA: 07/01/2016 PCP: No primary care provider on file.   Brief Narrative:  80 year old [place] history of hypertension, diabetes, restless leg syndrome, gastroesophageal reflux disease, peripheral vascular disease presented to the ED from PCPs office with left neck swelling with parotid gland enlargement. Concern for necrotizing mass per CT scan. Patient has been seen in consultation by Dr. Doyle Askew of ENT. Patient currently on IV antibiotics.   Assessment & Plan:   Principal Problem:   Cervical lymphadenitis Active Problems:   RLS (restless legs syndrome)   Diabetes mellitus (HCC)   GERD (gastroesophageal reflux disease)   HLD (hyperlipidemia)   HTN (hypertension)   Lymphadenitis  #1 cervical lymphadenitis/left parotid swelling/mass Patient presented with a cervical lymphadenitis/mass. Patient had a CT scan of the neck which showed a left parotid mass which is likely inflammatory. Patient denies any shortness of breath. No problems swallowing. Patient tolerating current diet. Patient has been assessed by ENT were recommending IV antibiotics and once symptoms have improved for discharge on oral antibiotics with close follow-up in the clinic for FNA and fibrotic exam of the pharynx and needle biopsy. QuantiFERON gold has been ordered and currently pending once negative then patient may be discharged. AFB sputum 3. Change IV nafcillin and Flagyl to IV Unasyn. On discharge will transition to either oral clindamycin no oral Augmentin.  #2 hypertension Continue current regimen of losartan and metoprolol.  #3 diabetes mellitus CBG of 145. Oral hypoglycemic agents on hold. Place on Sliding scale insulin.  #4 gastroesophageal reflux disease  PPI.  #5 history of TIA/PVD  Continue aspirin and statin. Recheck to modification.  #6 hyperlipidemia Continue fish oil and statin.   DVT prophylaxis: Heparin Code Status: Full Family  Communication: Updated patient and daughter at bedside. Disposition Plan: Home once quantiferon gold has resulted and is negative.   Consultants:   ENT: Dr. Janace Hoard 07/02/2016  Procedures:   CT soft tissue neck 07/01/2016  Antimicrobials:   IV Unasyn 07/02/2016  IV nafcillin 07/01/2016>>>>>> 07/02/2016  IV Flagyl 07/01/2016>>>>>> 07/02/2016   Subjective: Patient states she's feeling better. No chest pain. No shortness of breath.  Objective: Vitals:   07/01/16 2300 07/02/16 0510 07/02/16 0839 07/02/16 1100  BP: (!) 157/76 (!) 144/66  (!) 142/69  Pulse: 62 82 92 85  Resp: 16 16  17   Temp: 98.4 F (36.9 C) 98.4 F (36.9 C)  98.6 F (37 C)  TempSrc: Oral Oral  Oral  SpO2: 97% 96%  96%  Weight:      Height:        Intake/Output Summary (Last 24 hours) at 07/02/16 1239 Last data filed at 07/02/16 1000  Gross per 24 hour  Intake              600 ml  Output                0 ml  Net              600 ml   Filed Weights   07/01/16 1246  Weight: 62.6 kg (138 lb)    Examination:  General exam: Appears calm and comfortable  Respiratory system: Clear to auscultation. Respiratory effort normal. Cardiovascular system: S1 & S2 heard, RRR. No JVD, murmurs, rubs, gallops or clicks. No pedal edema. Gastrointestinal system: Abdomen is nondistended, soft and nontender. No organomegaly or masses felt. Normal bowel sounds heard. Central nervous system: Alert and oriented. No focal neurological deficits. Extremities: Symmetric 5  x 5 power. Skin: Left parotid is firm, enlarged with tenderness to palpation and some mobility.  Psychiatry: Judgement and insight appear normal. Mood & affect appropriate.     Data Reviewed: I have personally reviewed following labs and imaging studies  CBC:  Recent Labs Lab 07/01/16 1302  WBC 10.4  NEUTROABS 7.2  HGB 12.4  HCT 37.2  MCV 90.1  PLT A999333   Basic Metabolic Panel:  Recent Labs Lab 07/01/16 1302  NA 137  K 3.8  CL 102    CO2 27  GLUCOSE 145*  BUN 17  CREATININE 1.18*  CALCIUM 9.0   GFR: Estimated Creatinine Clearance: 31.6 mL/min (by C-G formula based on SCr of 1.18 mg/dL (H)). Liver Function Tests: No results for input(s): AST, ALT, ALKPHOS, BILITOT, PROT, ALBUMIN in the last 168 hours. No results for input(s): LIPASE, AMYLASE in the last 168 hours. No results for input(s): AMMONIA in the last 168 hours. Coagulation Profile: No results for input(s): INR, PROTIME in the last 168 hours. Cardiac Enzymes: No results for input(s): CKTOTAL, CKMB, CKMBINDEX, TROPONINI in the last 168 hours. BNP (last 3 results) No results for input(s): PROBNP in the last 8760 hours. HbA1C: No results for input(s): HGBA1C in the last 72 hours. CBG:  Recent Labs Lab 07/01/16 2101 07/02/16 0813 07/02/16 1215  GLUCAP 174* 181* 136*   Lipid Profile: No results for input(s): CHOL, HDL, LDLCALC, TRIG, CHOLHDL, LDLDIRECT in the last 72 hours. Thyroid Function Tests: No results for input(s): TSH, T4TOTAL, FREET4, T3FREE, THYROIDAB in the last 72 hours. Anemia Panel: No results for input(s): VITAMINB12, FOLATE, FERRITIN, TIBC, IRON, RETICCTPCT in the last 72 hours. Sepsis Labs: No results for input(s): PROCALCITON, LATICACIDVEN in the last 168 hours.  No results found for this or any previous visit (from the past 240 hour(s)).       Radiology Studies: Ct Soft Tissue Neck W Contrast  Result Date: 07/01/2016 CLINICAL DATA:  80 year old diabetic hypertensive female with swollen left-sided lymph nodes since last week. Initial encounter. EXAM: CT NECK WITH CONTRAST TECHNIQUE: Multidetector CT imaging of the neck was performed using the standard protocol following the bolus administration of intravenous contrast. CONTRAST:  17mL ISOVUE-300 IOPAMIDOL (ISOVUE-300) INJECTION 61% COMPARISON:  None. FINDINGS: Pharynx and larynx: Asymmetric appearance of the pharynx may be secondary to tortuous left carotid artery impression  upon the left aspect of the posterior pharynx rather primary mass although direct visualization would be necessary to exclude mucosa abnormality. Salivary glands: At the junction of the inferior aspect of the parotid gland and the left sternocleidomastoid muscle is a 2 cm necrotic/ inflammatory process. Difficult to determine if this represents a primary parotid mass with invasion of the sternocleidomastoid muscle versus adenopathy with invasion of the sternocleidomastoid muscle and parotid gland. Thyroid: No thyroid mass. Lymph nodes: As above. Additionally, there are scattered small lymph nodes throughout the neck bilaterally slightly greater on the left. Vascular: Negative for acute abnormality.Atherosclerotic changes aorta, great vessels and carotid arteries which are ectatic. Narrowing without No hemodynamically significant stenosis of either carotid bifurcation. Limited intracranial: Negative for acute abnormality. Visualized orbits: Negative for acute abnormality. Mastoids and visualized paranasal sinuses: Clear. Skeleton: Posterior decompression C3-C7 cervical spondylotic changes with kyphosis centered at the C5-6 level. Upper chest: Coarse calcification right lung apex suggestive of prior granulomatous exposure. Mild pulmonary vascular prominence. Other: Stranding of fat planes diffusely which may represent presence of cellulitis. IMPRESSION: At the junction of the inferior aspect of the left parotid gland and the left  sternocleidomastoid muscle is a 2 cm necrotic/ inflammatory process. Difficult to determine if this represents a primary parotid mass with invasion of the sternocleidomastoid muscle versus adenopathy (necrotic metastatic lymphadenopathy versus infectious adenopathy) with invasion of the sternocleidomastoid muscle and parotid gland. As the patient has had prior granulomatous exposure as indicated by coarse calcification right upper lobe, tuberculosis or other granulomatous process is a  possibility. Stranding of fat planes lower face/ neck may represent cellulitis or third spacing of fluid. Asymmetric appearance of the pharynx may be secondary to tortuous left carotid artery impression upon the left aspect of the posterior pharynx rather primary mass although direct visualization would be necessary to exclude mucosa abnormality. Postsurgical changes cervical spine with cervical spondylotic changes. Electronically Signed   By: Genia Del M.D.   On: 07/01/2016 14:59        Scheduled Meds: . acidophilus  2 capsule Oral Daily  . ampicillin-sulbactam (UNASYN) IV  3 g Intravenous Q6H  . cholecalciferol  1,000 Units Oral Daily  . ferrous sulfate  325 mg Oral BID  . folic acid  0.5 mg Oral Daily  . heparin  5,000 Units Subcutaneous Q8H  . insulin aspart  0-9 Units Subcutaneous TID WC  . losartan  100 mg Oral Daily  . metoprolol tartrate  25 mg Oral BID WC  . omega-3 acid ethyl esters  1,000 mg Oral TID  . pantoprazole  40 mg Oral Daily  . rOPINIRole  2 mg Oral TID  . simvastatin  40 mg Oral q1800  . traMADol  50 mg Oral TID  . vitamin E  400 Units Oral Daily   Continuous Infusions: . sodium chloride 50 mL/hr at 07/02/16 0030     LOS: 0 days    Time spent: 61 minutes    Preethi Scantlebury, MD [facility] Pager [phone]  If 7PM-7AM, please contact night-coverage [email or web address] Password [facility] 07/02/2016, 12:39 PM

## 2016-07-02 NOTE — Consult Note (Signed)
Reason for Consult:left neck swelling Referring Physician: hospitalist  Sara Griffith is an 80 y.o. female.  HPI: hx of 1-2 weeks of discomfort in the left neck area. She has not had this previous. She noticed increased erythema and pain and seen at [place]. She was admitted. She had CT scan with some left [parotid mass that was possibly inflammatory. She has no breathing problems. No swallowing issues. No sore throat.   Past Medical History:  Diagnosis Date  . CKD (chronic kidney disease)   . Diabetes mellitus without complication ([place])   . GERD (gastroesophageal reflux disease)   . HLD (hyperlipidemia)   . Hypertension   . IDA (iron deficiency anemia)   . PVD (peripheral vascular disease) ([place])   . RLS (restless legs syndrome)   . TIA (transient ischemic attack)   . Uses walker   . Vitamin D deficiency     Past Surgical History:  Procedure Laterality Date  . ABDOMINAL HYSTERECTOMY     with bladder tac  . APPENDECTOMY    . CARPAL TUNNEL RELEASE Right   . CATARACT EXTRACTION, BILATERAL    . CERVICAL DISC SURGERY    . COLONOSCOPY    . DILATION AND CURETTAGE OF UTERUS     x 2  . left great toe amputation    . ROTATOR CUFF REPAIR Left     History reviewed. No pertinent family history.  Social History:  reports that she has quit smoking. Her smoking use included Cigarettes. She quit after 30.00 years of use. She quit smokeless tobacco use about 62 years ago. Her smokeless tobacco use included Snuff. She reports that she does not drink alcohol or use drugs.  Allergies:  Allergies  Allergen Reactions  . Cymbalta [Duloxetine Hcl] Swelling  . Librax [Chlordiazepoxide-Clidinium] Swelling  . Lyrica [Pregabalin] Swelling  . Naproxen Swelling    Medications: I have reviewed the patient's current medications.  Results for orders placed or performed during the hospital encounter of 07/01/16 (from the past 48 hour(s))  Basic metabolic panel     Status: Abnormal   Collection  Time: 07/01/16  1:02 PM  Result Value Ref Range   Sodium 137 135 - 145 mmol/L   Potassium 3.8 3.5 - 5.1 mmol/L   Chloride 102 101 - 111 mmol/L   CO2 27 22 - 32 mmol/L   Glucose, Bld 145 (H) 65 - 99 mg/dL   BUN 17 6 - 20 mg/dL   Creatinine, Ser 1.18 (H) 0.44 - 1.00 mg/dL   Calcium 9.0 8.9 - 10.3 mg/dL   GFR calc non Af Amer 43 (L) >60 mL/min   GFR calc Af Amer 50 (L) >60 mL/min    Comment: (NOTE) The eGFR has been calculated using the CKD EPI equation. This calculation has not been validated in all clinical situations. eGFR's persistently <60 mL/min signify possible Chronic Kidney Disease.    Anion gap 8 5 - 15  CBC with Differential     Status: None   Collection Time: 07/01/16  1:02 PM  Result Value Ref Range   WBC 10.4 4.0 - 10.5 K/uL   RBC 4.13 3.87 - 5.11 MIL/uL   Hemoglobin 12.4 12.0 - 15.0 g/dL   HCT 37.2 36.0 - 46.0 %   MCV 90.1 78.0 - 100.0 fL   MCH 30.0 26.0 - 34.0 pg   MCHC 33.3 30.0 - 36.0 g/dL   RDW 13.1 11.5 - 15.5 %   Platelets 226 150 - 400 K/uL   Neutrophils Relative %  70 %   Neutro Abs 7.2 1.7 - 7.7 K/uL   Lymphocytes Relative 20 %   Lymphs Abs 2.1 0.7 - 4.0 K/uL   Monocytes Relative 9 %   Monocytes Absolute 0.9 0.1 - 1.0 K/uL   Eosinophils Relative 1 %   Eosinophils Absolute 0.1 0.0 - 0.7 K/uL   Basophils Relative 0 %   Basophils Absolute 0.0 0.0 - 0.1 K/uL  Glucose, capillary     Status: Abnormal   Collection Time: 07/01/16  9:01 PM  Result Value Ref Range   Glucose-Capillary 174 (H) 65 - 99 mg/dL    Ct Soft Tissue Neck W Contrast  Result Date: 07/01/2016 CLINICAL DATA:  80 year old diabetic hypertensive female with swollen left-sided lymph nodes since last week. Initial encounter. EXAM: CT NECK WITH CONTRAST TECHNIQUE: Multidetector CT imaging of the neck was performed using the standard protocol following the bolus administration of intravenous contrast. CONTRAST:  65m ISOVUE-300 IOPAMIDOL (ISOVUE-300) INJECTION 61% COMPARISON:  None. FINDINGS:  Pharynx and larynx: Asymmetric appearance of the pharynx may be secondary to tortuous left carotid artery impression upon the left aspect of the posterior pharynx rather primary mass although direct visualization would be necessary to exclude mucosa abnormality. Salivary glands: At the junction of the inferior aspect of the parotid gland and the left sternocleidomastoid muscle is a 2 cm necrotic/ inflammatory process. Difficult to determine if this represents a primary parotid mass with invasion of the sternocleidomastoid muscle versus adenopathy with invasion of the sternocleidomastoid muscle and parotid gland. Thyroid: No thyroid mass. Lymph nodes: As above. Additionally, there are scattered small lymph nodes throughout the neck bilaterally slightly greater on the left. Vascular: Negative for acute abnormality.Atherosclerotic changes aorta, great vessels and carotid arteries which are ectatic. Narrowing without No hemodynamically significant stenosis of either carotid bifurcation. Limited intracranial: Negative for acute abnormality. Visualized orbits: Negative for acute abnormality. Mastoids and visualized paranasal sinuses: Clear. Skeleton: Posterior decompression C3-C7 cervical spondylotic changes with kyphosis centered at the C5-6 level. Upper chest: Coarse calcification right lung apex suggestive of prior granulomatous exposure. Mild pulmonary vascular prominence. Other: Stranding of fat planes diffusely which may represent presence of cellulitis. IMPRESSION: At the junction of the inferior aspect of the left parotid gland and the left sternocleidomastoid muscle is a 2 cm necrotic/ inflammatory process. Difficult to determine if this represents a primary parotid mass with invasion of the sternocleidomastoid muscle versus adenopathy (necrotic metastatic lymphadenopathy versus infectious adenopathy) with invasion of the sternocleidomastoid muscle and parotid gland. As the patient has had prior granulomatous  exposure as indicated by coarse calcification right upper lobe, tuberculosis or other granulomatous process is a possibility. Stranding of fat planes lower face/ neck may represent cellulitis or third spacing of fluid. Asymmetric appearance of the pharynx may be secondary to tortuous left carotid artery impression upon the left aspect of the posterior pharynx rather primary mass although direct visualization would be necessary to exclude mucosa abnormality. Postsurgical changes cervical spine with cervical spondylotic changes. Electronically Signed   By: SGenia DelM.D.   On: 07/01/2016 14:59    Review of Systems  Constitutional: Negative.   HENT: Negative.   Eyes: Negative.   Skin: Negative.    Blood pressure (!) 144/66, pulse 82, temperature 98.4 F (36.9 C), temperature source Oral, resp. rate 16, height '4\' 11"'$  (1.499 m), weight 62.6 kg (138 lb), SpO2 96 %. Physical Exam  Constitutional: She appears well-developed and well-nourished.  HENT:  Head: Normocephalic and atraumatic.  Nose: Nose normal.  Left  parotid is firm and enlarged mostly in the tail portion. It is tender to palpation. It is mobile. No other adenopathy palpable. The left marginal mandibular nerve looks weak on the left with the lip turned inward.   Eyes: Conjunctivae are normal. Pupils are equal, round, and reactive to light.  Neck: Normal range of motion. Neck supple.    Assessment/Plan: Left parotid swelling- this possibly is a inflammed parotid with a mass present. She will get IV abx for today and once decreased symptoms discharge on po antibiotics. She has noticed for several months that her left lower lip has been weak. 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 She will need a FNA and will follow  up in my office to perform fibrotic exam of the pharynx and the needle biopsy. She has no pharyngeal swelling, breathing problem or swallowing issues. Follow up in 1 week. Discharge on clindamycin would be good choice.    Melissa Montane 07/02/2016, 7:58 AM

## 2016-07-02 NOTE — Progress Notes (Signed)
Pharmacy Antibiotic Note  Sara Griffith is a 80 y.o. female admitted on 07/01/2016 with left neck swelling with CT of the neck revealing a necrotizing mass between the prostatic gland and sternocleidomastoid. The patient was initially started on Nafcillin + Flagyl - now to be transitioned to Unasyn. ENT has been consulted and noted plans to likely change to po soon. Afebrile, WBC wnl, SCr 1.18, CrCl~30-35 ml/min.   Plan: 1. Start Unasyn 3g IV every 6 hours 2. Will continue to follow renal function, culture results, LOT, and antibiotic de-escalation plans   Height: 4\' 11"  (149.9 cm) Weight: 138 lb (62.6 kg) IBW/kg (Calculated) : 43.2  Temp (24hrs), Avg:98.4 F (36.9 C), Min:98 F (36.7 C), Max:98.6 F (37 C)   Recent Labs Lab 07/01/16 1302  WBC 10.4  CREATININE 1.18*    Estimated Creatinine Clearance: 31.6 mL/min (by C-G formula based on SCr of 1.18 mg/dL (H)).    Allergies  Allergen Reactions  . Cymbalta [Duloxetine Hcl] Swelling  . Librax [Chlordiazepoxide-Clidinium] Swelling  . Lyrica [Pregabalin] Swelling  . Naproxen Swelling    Antimicrobials this admission:  Nafcillin 10/31 >> 11/1 Flagyl 10/31 >> 11/1 Unasyn 11/1 >>  Dose adjustments this admission:  N/a  Microbiology results:  10/31 AFB x 3 >> 10/31 Quantiferon gold >>  Thank you for allowing pharmacy to be a part of this patient's care.  Thank you for allowing pharmacy to be a part of this patient's care.  Alycia Rossetti, PharmD, BCPS Clinical Pharmacist Pager: [phone] Clinical phone for 07/02/2016 from 7a-3:30p: [phone] If after 3:30p, please call main pharmacy at: [phone] 07/02/2016 11:32 AM

## 2016-07-03 DIAGNOSIS — G2581 Restless legs syndrome: Secondary | ICD-10-CM

## 2016-07-03 LAB — CBC
HCT: 37.8 % (ref 36.0–46.0)
HEMOGLOBIN: 12.2 g/dL (ref 12.0–15.0)
MCH: 29 pg (ref 26.0–34.0)
MCHC: 32.3 g/dL (ref 30.0–36.0)
MCV: 89.8 fL (ref 78.0–100.0)
PLATELETS: 243 10*3/uL (ref 150–400)
RBC: 4.21 MIL/uL (ref 3.87–5.11)
RDW: 13 % (ref 11.5–15.5)
WBC: 9.8 10*3/uL (ref 4.0–10.5)

## 2016-07-03 LAB — GLUCOSE, CAPILLARY
GLUCOSE-CAPILLARY: 146 mg/dL — AB (ref 65–99)
GLUCOSE-CAPILLARY: 148 mg/dL — AB (ref 65–99)
GLUCOSE-CAPILLARY: 166 mg/dL — AB (ref 65–99)
Glucose-Capillary: 228 mg/dL — ABNORMAL HIGH (ref 65–99)

## 2016-07-03 LAB — BASIC METABOLIC PANEL
ANION GAP: 8 (ref 5–15)
BUN: 12 mg/dL (ref 6–20)
CHLORIDE: 102 mmol/L (ref 101–111)
CO2: 27 mmol/L (ref 22–32)
Calcium: 8.3 mg/dL — ABNORMAL LOW (ref 8.9–10.3)
Creatinine, Ser: 1.2 mg/dL — ABNORMAL HIGH (ref 0.44–1.00)
GFR calc Af Amer: 49 mL/min — ABNORMAL LOW (ref >60)
GFR, EST NON AFRICAN AMERICAN: 42 mL/min — AB (ref >60)
GLUCOSE: 157 mg/dL — AB (ref 65–99)
POTASSIUM: 4.1 mmol/L (ref 3.5–5.1)
Sodium: 137 mmol/L (ref 135–145)

## 2016-07-03 MED ORDER — METOPROLOL TARTRATE 25 MG PO TABS
25.0000 mg | ORAL_TABLET | Freq: Once | ORAL | Status: AC
  Administered 2016-07-03: 25 mg via ORAL
  Filled 2016-07-03: qty 1

## 2016-07-03 MED ORDER — ONDANSETRON HCL 4 MG/2ML IJ SOLN
4.0000 mg | Freq: Four times a day (QID) | INTRAMUSCULAR | Status: DC | PRN
  Administered 2016-07-03: 4 mg via INTRAVENOUS
  Filled 2016-07-03: qty 2

## 2016-07-03 MED ORDER — METOPROLOL TARTRATE 50 MG PO TABS
50.0000 mg | ORAL_TABLET | Freq: Two times a day (BID) | ORAL | Status: DC
  Administered 2016-07-03 – 2016-07-04 (×2): 50 mg via ORAL
  Filled 2016-07-03 (×2): qty 1

## 2016-07-03 NOTE — Progress Notes (Signed)
PROGRESS NOTE    Sara Griffith  [id number] DOB: 08-08-1936 DOA: 07/01/2016 PCP: No primary care provider on file.   Brief Narrative:  80 year old [place] history of hypertension, diabetes, restless leg syndrome, gastroesophageal reflux disease, peripheral vascular disease presented to the ED from PCPs office with left neck swelling with parotid gland enlargement. Concern for necrotizing mass per CT scan. Patient has been seen in consultation by Dr. Doyle Askew of ENT. Patient currently on IV antibiotics.   Assessment & Plan:   Principal Problem:   Cervical lymphadenitis Active Problems:   RLS (restless legs syndrome)   Diabetes mellitus ([facility])   GERD (gastroesophageal reflux disease)   HLD (hyperlipidemia)   HTN (hypertension)   Lymphadenitis  #1 cervical lymphadenitis/left parotid swelling/mass Patient presented with a cervical lymphadenitis/mass. Patient had a CT scan of the neck which showed a left parotid mass which is likely inflammatory. Patient denies any shortness of breath. No problems swallowing. Patient tolerating current diet. Patient has been assessed by ENT were recommending IV antibiotics and once symptoms have improved for discharge on oral antibiotics with close follow-up in the clinic for FNA and fibrotic exam of the pharynx and needle biopsy. QuantiFERON gold has been ordered and currently pending once negative then patient may be discharged. AFB sputum 3. Changed IV nafcillin and Flagyl to IV Unasyn. On discharge will transition to either oral clindamycin or oral Augmentin.  #2 hypertension Continue current regimen of losartan and metoprolol.  #3 diabetes mellitus CBG of 145. Oral hypoglycemic agents on hold. Place on Sliding scale insulin.  #4 gastroesophageal reflux disease  PPI.  #5 history of TIA/PVD  Continue aspirin and statin. Recheck to modification.  #6 hyperlipidemia Continue fish oil and statin.   DVT prophylaxis: Heparin Code Status:  Full Family Communication: Updated patient and daughter at bedside. Disposition Plan: Home once quantiferon gold has resulted and is negative.   Consultants:   ENT: Dr. Janace Hoard 07/02/2016  Procedures:   CT soft tissue neck 07/01/2016  Antimicrobials:   IV Unasyn 07/02/2016  IV nafcillin 07/01/2016>>>>>> 07/02/2016  IV Flagyl 07/01/2016>>>>>> 07/02/2016   Subjective: Patient states she's feeling better. No chest pain. No shortness of breath.  Objective: Vitals:   07/02/16 2120 07/03/16 0728 07/03/16 1007 07/03/16 1711  BP: (!) 162/77 (!) 171/81 (!) 180/80 (!) 145/77  Pulse: 75 71 75 72  Resp: 18 20 19 18   Temp: 98.5 F (36.9 C) 97.9 F (36.6 C) 98 F (36.7 C) 98.5 F (36.9 C)  TempSrc:   Oral Oral  SpO2: 93% 94% 95% 95%  Weight: 61.2 kg (135 lb)     Height:        Intake/Output Summary (Last 24 hours) at 07/03/16 2104 Last data filed at 07/03/16 1800  Gross per 24 hour  Intake              620 ml  Output                0 ml  Net              620 ml   Filed Weights   07/01/16 1246 07/02/16 2120  Weight: 62.6 kg (138 lb) 61.2 kg (135 lb)    Examination:  General exam: Appears calm and comfortable  Respiratory system: Clear to auscultation. Respiratory effort normal. Cardiovascular system: S1 & S2 heard, RRR. No JVD, murmurs, rubs, gallops or clicks. No pedal edema. Gastrointestinal system: Abdomen is nondistended, soft and nontender. No organomegaly or masses felt. Normal bowel sounds heard.  Central nervous system: Alert and oriented. No focal neurological deficits. Extremities: Symmetric 5 x 5 power. Skin: Left parotid is firm, enlarged with tenderness to palpation and some mobility. Decreased erythema. Psychiatry: Judgement and insight appear normal. Mood & affect appropriate.     Data Reviewed: I have personally reviewed following labs and imaging studies  CBC:  Recent Labs Lab 07/01/16 1302 07/03/16 0620  WBC 10.4 9.8  NEUTROABS 7.2  --    HGB 12.4 12.2  HCT 37.2 37.8  MCV 90.1 89.8  PLT 226 0000000   Basic Metabolic Panel:  Recent Labs Lab 07/01/16 1302 07/03/16 0620  NA 137 137  K 3.8 4.1  CL 102 102  CO2 27 27  GLUCOSE 145* 157*  BUN 17 12  CREATININE 1.18* 1.20*  CALCIUM 9.0 8.3*   GFR: Estimated Creatinine Clearance: 30.7 mL/min (by C-G formula based on SCr of 1.2 mg/dL (H)). Liver Function Tests: No results for input(s): AST, ALT, ALKPHOS, BILITOT, PROT, ALBUMIN in the last 168 hours. No results for input(s): LIPASE, AMYLASE in the last 168 hours. No results for input(s): AMMONIA in the last 168 hours. Coagulation Profile: No results for input(s): INR, PROTIME in the last 168 hours. Cardiac Enzymes: No results for input(s): CKTOTAL, CKMB, CKMBINDEX, TROPONINI in the last 168 hours. BNP (last 3 results) No results for input(s): PROBNP in the last 8760 hours. HbA1C: No results for input(s): HGBA1C in the last 72 hours. CBG:  Recent Labs Lab 07/02/16 1714 07/02/16 2106 07/03/16 0725 07/03/16 1223 07/03/16 1649  GLUCAP 117* 127* 146* 228* 148*   Lipid Profile: No results for input(s): CHOL, HDL, LDLCALC, TRIG, CHOLHDL, LDLDIRECT in the last 72 hours. Thyroid Function Tests: No results for input(s): TSH, T4TOTAL, FREET4, T3FREE, THYROIDAB in the last 72 hours. Anemia Panel: No results for input(s): VITAMINB12, FOLATE, FERRITIN, TIBC, IRON, RETICCTPCT in the last 72 hours. Sepsis Labs: No results for input(s): PROCALCITON, LATICACIDVEN in the last 168 hours.  No results found for this or any previous visit (from the past 240 hour(s)).       Radiology Studies: No results found.      Scheduled Meds: . acidophilus  2 capsule Oral Daily  . ampicillin-sulbactam (UNASYN) IV  3 g Intravenous Q6H  . cholecalciferol  1,000 Units Oral Daily  . ferrous sulfate  325 mg Oral BID  . folic acid  0.5 mg Oral Daily  . heparin  5,000 Units Subcutaneous Q8H  . insulin aspart  0-9 Units Subcutaneous  TID WC  . insulin aspart  0-9 Units Subcutaneous TID WC  . losartan  100 mg Oral Daily  . metoprolol tartrate  50 mg Oral BID WC  . omega-3 acid ethyl esters  1,000 mg Oral TID  . pantoprazole  40 mg Oral Daily  . rOPINIRole  2 mg Oral TID  . simvastatin  40 mg Oral q1800  . traMADol  50 mg Oral TID  . vitamin E  400 Units Oral Daily   Continuous Infusions: . sodium chloride 50 mL/hr at 07/02/16 0030     LOS: 1 day    Time spent: 61 minutes    THOMPSON,DANIEL, MD [facility] Pager [phone]  If 7PM-7AM, please contact night-coverage [email or web address] Password [email or web address] 07/03/2016, 9:04 PM

## 2016-07-04 LAB — QUANTIFERON IN TUBE
QFT TB AG MINUS NIL VALUE: 0.2 [IU]/mL
QUANTIFERON MITOGEN VALUE: 8.46 [IU]/mL
QUANTIFERON TB AG VALUE: 0.22 IU/mL
QUANTIFERON TB GOLD: NEGATIVE
Quantiferon Nil Value: 0.02 IU/mL

## 2016-07-04 LAB — HEMOGLOBIN A1C
HEMOGLOBIN A1C: 7.9 % — AB (ref 4.8–5.6)
MEAN PLASMA GLUCOSE: 180 mg/dL

## 2016-07-04 LAB — GLUCOSE, CAPILLARY
GLUCOSE-CAPILLARY: 143 mg/dL — AB (ref 65–99)
Glucose-Capillary: 209 mg/dL — ABNORMAL HIGH (ref 65–99)

## 2016-07-04 LAB — QUANTIFERON TB GOLD ASSAY (BLOOD)

## 2016-07-04 MED ORDER — METOPROLOL TARTRATE 50 MG PO TABS: 50.0000 mg | ORAL_TABLET | Freq: Two times a day (BID) | ORAL | 2 refills | Status: AC

## 2016-07-04 MED ORDER — CLINDAMYCIN HCL 300 MG PO CAPS: 600.0000 mg | ORAL_CAPSULE | Freq: Three times a day (TID) | ORAL | Status: DC

## 2016-07-04 MED ORDER — CLINDAMYCIN HCL 150 MG PO CAPS: 450.0000 mg | ORAL_CAPSULE | Freq: Three times a day (TID) | ORAL | 0 refills | Status: AC

## 2016-07-04 MED ORDER — HYDROCODONE-ACETAMINOPHEN 5-325 MG PO TABS: 1.0000 | ORAL_TABLET | ORAL | 0 refills | Status: DC | PRN

## 2016-07-04 MED ORDER — FOLIC ACID 1 MG PO TABS: 0.5000 mg | ORAL_TABLET | Freq: Every day | ORAL | 1 refills | Status: DC

## 2016-07-04 MED ORDER — CLINDAMYCIN HCL 300 MG PO CAPS
450.0000 mg | ORAL_CAPSULE | Freq: Three times a day (TID) | ORAL | Status: DC
  Administered 2016-07-04: 450 mg via ORAL
  Filled 2016-07-04 (×2): qty 1

## 2016-07-04 NOTE — Progress Notes (Signed)
Pt was provided with discharge education. Family at bedside.   Pt d/c to assisted living- [facility].   Removed peripheral IV- catheter intact, site was clean, no bleeding.   Pt belongings gathered in bag and taken with pt.  Addressed the pt's questions and concerns about pending test results.   Antibiotic education was given to family and pt. Both demonstrated understanding of education.  Pt was escorted off the unit in wheelchair by staff member and family.  No concerns were voiced at the time.   Paulla Fore, RN

## 2016-07-04 NOTE — Discharge Summary (Signed)
Physician Discharge Summary  Sara Griffith [id number] DOB: [birth year] DOA: 07/01/2016  PCP: Tawanna Solo, MD  Admit date: 07/01/2016 Discharge date: 07/04/2016  Time spent: 60 minutes  Recommendations for Outpatient Follow-up:  1. Follow-up with Dr. Janace Hoard of ENT in one week, for further evaluation of neck mass and FNA with biopsy. 2. Follow-up with Tawanna Solo, MD in 2 weeks.   Discharge Diagnoses:  Principal Problem:   Cervical lymphadenitis Active Problems:   RLS (restless legs syndrome)   Diabetes mellitus (HCC)   GERD (gastroesophageal reflux disease)   HLD (hyperlipidemia)   HTN (hypertension)   Lymphadenitis   Discharge Condition: Stable and improved.  Diet recommendation: Carb modified  Filed Weights   07/01/16 1246 07/02/16 2120  Weight: 62.6 kg (138 lb) 61.2 kg (135 lb)    History of present illness:  Per Dr [facility]ron Labs Sara Griffith  is a 80 y.o. female, With past medical history of hypertension, diabetes mellitus, GERD, restless leg, she daily, TIA, PVD, RLS, sent by her PCP to evaluate left neck swelling, patient reported she noticed symptoms last week, which went to her PCP, where she thought it was a muscle spasm, where she sent her for PT, patient reported worsening of symptoms including worsening pain, swelling, erythema, as well reported feeling feverish, pain worsened by movement, she denied any problems swallowing or breathing, no sore throat, no dental pain, no neck pain, no chest pain, no cough, no hemoptysis. - in ED CT neck with contrast was obtained, significant for necrotizing mass between prostatic gland and sternocleidomastoid, possibly lymph node versus peritoneal infection, ED discussed with ENT Dr. Janace Hoard, who requested patient to be transferred to [facility] for further ENT evaluation and workup.  Hospital Course:  #1 cervical lymphadenitis/left parotid swelling/mass Patient presented with a cervical lymphadenitis/mass. Patient had a CT  scan of the neck which showed a left parotid mass which is likely inflammatory. Patient denied any shortness of breath. No problems swallowing. No stridor. Patient was assessed by ENT who were recommending IV antibiotics and once symptoms have improved for discharge on oral antibiotics with close follow-up in the clinic for FNA and fibrotic exam of the pharynx and needle biopsy. Patient was initially placed on IV nafcillin and IV Flagyl and subsequently antibiotics narrowed down to IV Unasyn which she tolerated. Patient did not have any respiratory symptoms. QuantiFERON gold was ordered and was pending at the time of discharge. AFB sputum was ordered however unable to be collected as patient did not have a productive cough. Patient improved clinically with improvement in neck pain and swelling and was subsequently discharged home on oral clindamycin to complete a course of antibiotic treatment. Patient is to follow-up with Dr. Janace Hoard of ENT in one week.  #2 hypertension Patient was maintained on home regimen of losartan and metoprolol.  #3 diabetes mellitus Patient's oral hypoglycemic agents were held during the hospitalization. Patient was maintained on a sliding scale insulin.  #4 gastroesophageal reflux disease  Patient was maintained on a PPI.  #5 history of TIA/PVD  Continued on home regimen of aspirin and statin.  #6 hyperlipidemia Continued on home regimen of fish oil and statin.   Procedures:  CT soft tissue neck 07/01/2016  Consultations:  ENT: Dr. Janace Hoard 07/02/2016  Discharge Exam: Vitals:   07/04/16 0437 07/04/16 0820  BP: 136/76 (!) 166/81  Pulse: 62 65  Resp: 18 18  Temp: 98.4 F (36.9 C) 97.8 F (36.6 C)    General: NAD Cardiovascular: RRR Respiratory: CTAB  Discharge  Instructions   Discharge Instructions    Diet Carb Modified    Complete by:  As directed    Increase activity slowly    Complete by:  As directed      Current Discharge Medication  List    START taking these medications   Details  clindamycin (CLEOCIN) 150 MG capsule Take 3 capsules (450 mg total) by mouth every 8 (eight) hours. Qty: 63 capsule, Refills: 0    HYDROcodone-acetaminophen (NORCO/VICODIN) 5-325 MG tablet Take 1-2 tablets by mouth every 4 (four) hours as needed for moderate pain. Qty: 15 tablet, Refills: 0      CONTINUE these medications which have CHANGED   Details  folic acid (FOLVITE) 1 MG tablet Take 0.5 tablets (0.5 mg total) by mouth daily. Qty: 30 tablet, Refills: 1    metoprolol tartrate (LOPRESSOR) 50 MG tablet Take 1 tablet (50 mg total) by mouth 2 (two) times daily with a meal. Qty: 60 tablet, Refills: 2      CONTINUE these medications which have NOT CHANGED   Details  aspirin EC 81 MG tablet Take 81 mg by mouth daily.    cholecalciferol (VITAMIN D) 1000 units tablet Take 1,000 Units by mouth daily.    ferrous sulfate 325 (65 FE) MG tablet Take 325 mg by mouth 2 (two) times daily. Refills: 0    losartan (COZAAR) 100 MG tablet Take 100 mg by mouth daily. Refills: 0    omega-3 acid ethyl esters (LOVAZA) 1 g capsule Take 1,000 mg by mouth 3 (three) times daily. Refills: 0    omeprazole (PRILOSEC) 40 MG capsule Take 40 mg by mouth daily. Refills: 0    OVER THE COUNTER MEDICATION Place 1 drop into both eyes as needed (dry eyes). Over the counter dry eye drops.    Ropinirole HCl 6 MG TB24 Take 6 mg by mouth daily. Refills: 1    simvastatin (ZOCOR) 40 MG tablet Take 40 mg by mouth every evening. Refills: 0    TRADJENTA 5 MG TABS tablet Take 5 mg by mouth daily. Refills: 0    traMADol (ULTRAM) 50 MG tablet Take 50 mg by mouth 3 (three) times daily. Refills: 0    vitamin E 400 UNIT capsule Take 400 Units by mouth daily.       Allergies  Allergen Reactions  . Cymbalta [Duloxetine Hcl] Swelling  . Librax [Chlordiazepoxide-Clidinium] Swelling  . Lyrica [Pregabalin] Swelling  . Naproxen Swelling   Follow-up Information     Melissa Montane, MD. Schedule an appointment as soon as possible for a visit in 1 week(s).   Specialty:  Otolaryngology Why:  f/u next week. Contact information: [address] [place] [address] [phone]        Tawanna Solo, MD. Schedule an appointment as soon as possible for a visit in 2 week(s).   Specialty:  Family Medicine Contact information: [place]  [address] [phone]            The results of significant diagnostics from this hospitalization (including imaging, microbiology, ancillary and laboratory) are listed below for reference.    Significant Diagnostic Studies: Ct Soft Tissue Neck W Contrast  Result Date: 07/01/2016 CLINICAL DATA:  80 year old diabetic hypertensive female with swollen left-sided lymph nodes since last week. Initial encounter. EXAM: CT NECK WITH CONTRAST TECHNIQUE: Multidetector CT imaging of the neck was performed using the standard protocol following the bolus administration of intravenous contrast. CONTRAST:  40mL ISOVUE-300 IOPAMIDOL (ISOVUE-300) INJECTION 61%  COMPARISON:  None. FINDINGS: Pharynx and larynx: Asymmetric appearance of the pharynx may be secondary to tortuous left carotid artery impression upon the left aspect of the posterior pharynx rather primary mass although direct visualization would be necessary to exclude mucosa abnormality. Salivary glands: At the junction of the inferior aspect of the parotid gland and the left sternocleidomastoid muscle is a 2 cm necrotic/ inflammatory process. Difficult to determine if this represents a primary parotid mass with invasion of the sternocleidomastoid muscle versus adenopathy with invasion of the sternocleidomastoid muscle and parotid gland. Thyroid: No thyroid mass. Lymph nodes: As above. Additionally, there are scattered small lymph nodes throughout the neck bilaterally slightly greater on the left. Vascular: Negative for acute  abnormality.Atherosclerotic changes aorta, great vessels and carotid arteries which are ectatic. Narrowing without No hemodynamically significant stenosis of either carotid bifurcation. Limited intracranial: Negative for acute abnormality. Visualized orbits: Negative for acute abnormality. Mastoids and visualized paranasal sinuses: Clear. Skeleton: Posterior decompression C3-C7 cervical spondylotic changes with kyphosis centered at the C5-6 level. Upper chest: Coarse calcification right lung apex suggestive of prior granulomatous exposure. Mild pulmonary vascular prominence. Other: Stranding of fat planes diffusely which may represent presence of cellulitis. IMPRESSION: At the junction of the inferior aspect of the left parotid gland and the left sternocleidomastoid muscle is a 2 cm necrotic/ inflammatory process. Difficult to determine if this represents a primary parotid mass with invasion of the sternocleidomastoid muscle versus adenopathy (necrotic metastatic lymphadenopathy versus infectious adenopathy) with invasion of the sternocleidomastoid muscle and parotid gland. As the patient has had prior granulomatous exposure as indicated by coarse calcification right upper lobe, tuberculosis or other granulomatous process is a possibility. Stranding of fat planes lower face/ neck may represent cellulitis or third spacing of fluid. Asymmetric appearance of the pharynx may be secondary to tortuous left carotid artery impression upon the left aspect of the posterior pharynx rather primary mass although direct visualization would be necessary to exclude mucosa abnormality. Postsurgical changes cervical spine with cervical spondylotic changes. Electronically Signed   By: Genia Del M.D.   On: 07/01/2016 14:59    Microbiology: No results found for this or any previous visit (from the past 240 hour(s)).   Labs: Basic Metabolic Panel:  Recent Labs Lab 07/01/16 1302 07/03/16 0620  NA 137 137  K 3.8 4.1  CL  102 102  CO2 27 27  GLUCOSE 145* 157*  BUN 17 12  CREATININE 1.18* 1.20*  CALCIUM 9.0 8.3*   Liver Function Tests: No results for input(s): AST, ALT, ALKPHOS, BILITOT, PROT, ALBUMIN in the last 168 hours. No results for input(s): LIPASE, AMYLASE in the last 168 hours. No results for input(s): AMMONIA in the last 168 hours. CBC:  Recent Labs Lab 07/01/16 1302 07/03/16 0620  WBC 10.4 9.8  NEUTROABS 7.2  --   HGB 12.4 12.2  HCT 37.2 37.8  MCV 90.1 89.8  PLT 226 243   Cardiac Enzymes: No results for input(s): CKTOTAL, CKMB, CKMBINDEX, TROPONINI in the last 168 hours. BNP: BNP (last 3 results) No results for input(s): BNP in the last 8760 hours.  ProBNP (last 3 results) No results for input(s): PROBNP in the last 8760 hours.  CBG:  Recent Labs Lab 07/03/16 1223 07/03/16 1649 07/03/16 2206 07/04/16 0757 07/04/16 1139  GLUCAP 228* 148* 166* 143* 209*       Signed:  THOMPSON,DANIEL MD.  [facility] 07/04/2016, 3:14 PM

## 2016-07-04 NOTE — Progress Notes (Signed)
Pharmacy Antibiotic Note  Sara Griffith is a 80 y.o. female admitted on 07/01/2016 with left neck swelling with CT of the neck revealing a necrotizing mass between the prostatic gland and sternocleidomastoid. The patient was initially started on Nafcillin + Flagyl - then transitioned to Unasyn on 11/1. ENT has been consulted and noted plans to likely change to po antibiotics at discharge. Discharge is pending Quantiferon gold results. Tmax/24h: 99.2, WBC wnl, SCr 1.2, CrCl~30-35 ml/min. Dose remains appropriate  Plan: 1. Continue Unasyn 3g IV every 6 hours 2. Will continue to follow renal function, culture results, LOT, and antibiotic de-escalation plans   Height: 4\' 11"  (149.9 cm) Weight: 135 lb (61.2 kg) IBW/kg (Calculated) : 43.2  Temp (24hrs), Avg:98.5 F (36.9 C), Min:97.8 F (36.6 C), Max:99.2 F (37.3 C)   Recent Labs Lab 07/01/16 1302 07/03/16 0620  WBC 10.4 9.8  CREATININE 1.18* 1.20*    Estimated Creatinine Clearance: 30.7 mL/min (by C-G formula based on SCr of 1.2 mg/dL (H)).    Allergies  Allergen Reactions  . Cymbalta [Duloxetine Hcl] Swelling  . Librax [Chlordiazepoxide-Clidinium] Swelling  . Lyrica [Pregabalin] Swelling  . Naproxen Swelling    Antimicrobials this admission:  Nafcillin 10/31 >> 11/1 Flagyl 10/31 >> 11/1 Unasyn 11/1 >>  Dose adjustments this admission:  N/a  Microbiology results:  10/31 AFB x 3 >> canceled 10/31 Quantiferon gold >> in process  Thank you for allowing pharmacy to be a part of this patient's care.  Alycia Rossetti, PharmD, BCPS Clinical Pharmacist Pager: [phone] Clinical phone for 07/04/2016 from 7a-3:30p: [phone] If after 3:30p, please call main pharmacy at: [phone] 07/04/2016 10:53 AM

## 2016-07-08 DIAGNOSIS — E1151 Type 2 diabetes mellitus with diabetic peripheral angiopathy without gangrene: Secondary | ICD-10-CM | POA: Diagnosis not present

## 2016-07-08 DIAGNOSIS — I129 Hypertensive chronic kidney disease with stage 1 through stage 4 chronic kidney disease, or unspecified chronic kidney disease: Secondary | ICD-10-CM | POA: Diagnosis not present

## 2016-07-08 DIAGNOSIS — L97521 Non-pressure chronic ulcer of other part of left foot limited to breakdown of skin: Secondary | ICD-10-CM | POA: Diagnosis not present

## 2016-07-08 DIAGNOSIS — E1140 Type 2 diabetes mellitus with diabetic neuropathy, unspecified: Secondary | ICD-10-CM | POA: Diagnosis not present

## 2016-07-08 DIAGNOSIS — E11621 Type 2 diabetes mellitus with foot ulcer: Secondary | ICD-10-CM | POA: Diagnosis not present

## 2016-07-08 DIAGNOSIS — E119 Type 2 diabetes mellitus without complications: Secondary | ICD-10-CM

## 2016-07-14 DIAGNOSIS — R221 Localized swelling, mass and lump, neck: Secondary | ICD-10-CM | POA: Diagnosis not present

## 2016-07-14 DIAGNOSIS — H93291 Other abnormal auditory perceptions, right ear: Secondary | ICD-10-CM | POA: Diagnosis not present

## 2016-07-14 DIAGNOSIS — D37030 Neoplasm of uncertain behavior of the parotid salivary glands: Secondary | ICD-10-CM | POA: Diagnosis not present

## 2016-07-14 DIAGNOSIS — R29810 Facial weakness: Secondary | ICD-10-CM | POA: Diagnosis not present

## 2016-07-14 DIAGNOSIS — K1120 Sialoadenitis, unspecified: Secondary | ICD-10-CM | POA: Diagnosis not present

## 2016-07-14 DIAGNOSIS — H6121 Impacted cerumen, right ear: Secondary | ICD-10-CM | POA: Diagnosis not present

## 2016-07-16 DIAGNOSIS — E1151 Type 2 diabetes mellitus with diabetic peripheral angiopathy without gangrene: Secondary | ICD-10-CM | POA: Diagnosis not present

## 2016-07-16 DIAGNOSIS — L97521 Non-pressure chronic ulcer of other part of left foot limited to breakdown of skin: Secondary | ICD-10-CM | POA: Diagnosis not present

## 2016-07-16 DIAGNOSIS — I129 Hypertensive chronic kidney disease with stage 1 through stage 4 chronic kidney disease, or unspecified chronic kidney disease: Secondary | ICD-10-CM | POA: Diagnosis not present

## 2016-07-16 DIAGNOSIS — E1140 Type 2 diabetes mellitus with diabetic neuropathy, unspecified: Secondary | ICD-10-CM | POA: Diagnosis not present

## 2016-07-16 DIAGNOSIS — E11621 Type 2 diabetes mellitus with foot ulcer: Secondary | ICD-10-CM | POA: Diagnosis not present

## 2016-07-17 DIAGNOSIS — E1151 Type 2 diabetes mellitus with diabetic peripheral angiopathy without gangrene: Secondary | ICD-10-CM | POA: Diagnosis not present

## 2016-07-17 DIAGNOSIS — E11621 Type 2 diabetes mellitus with foot ulcer: Secondary | ICD-10-CM | POA: Diagnosis not present

## 2016-07-17 DIAGNOSIS — L97521 Non-pressure chronic ulcer of other part of left foot limited to breakdown of skin: Secondary | ICD-10-CM | POA: Diagnosis not present

## 2016-07-17 DIAGNOSIS — I129 Hypertensive chronic kidney disease with stage 1 through stage 4 chronic kidney disease, or unspecified chronic kidney disease: Secondary | ICD-10-CM | POA: Diagnosis not present

## 2016-07-17 DIAGNOSIS — E1140 Type 2 diabetes mellitus with diabetic neuropathy, unspecified: Secondary | ICD-10-CM | POA: Diagnosis not present

## 2016-07-19 DIAGNOSIS — E1140 Type 2 diabetes mellitus with diabetic neuropathy, unspecified: Secondary | ICD-10-CM | POA: Diagnosis not present

## 2016-07-19 DIAGNOSIS — E11621 Type 2 diabetes mellitus with foot ulcer: Secondary | ICD-10-CM | POA: Diagnosis not present

## 2016-07-19 DIAGNOSIS — I129 Hypertensive chronic kidney disease with stage 1 through stage 4 chronic kidney disease, or unspecified chronic kidney disease: Secondary | ICD-10-CM | POA: Diagnosis not present

## 2016-07-19 DIAGNOSIS — L97521 Non-pressure chronic ulcer of other part of left foot limited to breakdown of skin: Secondary | ICD-10-CM | POA: Diagnosis not present

## 2016-07-19 DIAGNOSIS — E1151 Type 2 diabetes mellitus with diabetic peripheral angiopathy without gangrene: Secondary | ICD-10-CM | POA: Diagnosis not present

## 2016-07-21 DIAGNOSIS — E11621 Type 2 diabetes mellitus with foot ulcer: Secondary | ICD-10-CM | POA: Diagnosis not present

## 2016-07-21 DIAGNOSIS — E1140 Type 2 diabetes mellitus with diabetic neuropathy, unspecified: Secondary | ICD-10-CM | POA: Diagnosis not present

## 2016-07-21 DIAGNOSIS — E1151 Type 2 diabetes mellitus with diabetic peripheral angiopathy without gangrene: Secondary | ICD-10-CM | POA: Diagnosis not present

## 2016-07-21 DIAGNOSIS — I129 Hypertensive chronic kidney disease with stage 1 through stage 4 chronic kidney disease, or unspecified chronic kidney disease: Secondary | ICD-10-CM | POA: Diagnosis not present

## 2016-07-21 DIAGNOSIS — L97521 Non-pressure chronic ulcer of other part of left foot limited to breakdown of skin: Secondary | ICD-10-CM | POA: Diagnosis not present

## 2016-07-28 DIAGNOSIS — L97521 Non-pressure chronic ulcer of other part of left foot limited to breakdown of skin: Secondary | ICD-10-CM | POA: Diagnosis not present

## 2016-07-28 DIAGNOSIS — E1140 Type 2 diabetes mellitus with diabetic neuropathy, unspecified: Secondary | ICD-10-CM | POA: Diagnosis not present

## 2016-07-28 DIAGNOSIS — E1151 Type 2 diabetes mellitus with diabetic peripheral angiopathy without gangrene: Secondary | ICD-10-CM | POA: Diagnosis not present

## 2016-07-28 DIAGNOSIS — I129 Hypertensive chronic kidney disease with stage 1 through stage 4 chronic kidney disease, or unspecified chronic kidney disease: Secondary | ICD-10-CM | POA: Diagnosis not present

## 2016-07-28 DIAGNOSIS — E11621 Type 2 diabetes mellitus with foot ulcer: Secondary | ICD-10-CM | POA: Diagnosis not present

## 2016-07-29 DIAGNOSIS — K119 Disease of salivary gland, unspecified: Secondary | ICD-10-CM | POA: Diagnosis not present

## 2016-07-30 DIAGNOSIS — E1140 Type 2 diabetes mellitus with diabetic neuropathy, unspecified: Secondary | ICD-10-CM | POA: Diagnosis not present

## 2016-07-30 DIAGNOSIS — E11621 Type 2 diabetes mellitus with foot ulcer: Secondary | ICD-10-CM | POA: Diagnosis not present

## 2016-07-30 DIAGNOSIS — I129 Hypertensive chronic kidney disease with stage 1 through stage 4 chronic kidney disease, or unspecified chronic kidney disease: Secondary | ICD-10-CM | POA: Diagnosis not present

## 2016-07-30 DIAGNOSIS — E1151 Type 2 diabetes mellitus with diabetic peripheral angiopathy without gangrene: Secondary | ICD-10-CM | POA: Diagnosis not present

## 2016-07-30 DIAGNOSIS — L97521 Non-pressure chronic ulcer of other part of left foot limited to breakdown of skin: Secondary | ICD-10-CM | POA: Diagnosis not present

## 2016-07-31 DIAGNOSIS — E1140 Type 2 diabetes mellitus with diabetic neuropathy, unspecified: Secondary | ICD-10-CM | POA: Diagnosis not present

## 2016-07-31 DIAGNOSIS — E11621 Type 2 diabetes mellitus with foot ulcer: Secondary | ICD-10-CM | POA: Diagnosis not present

## 2016-07-31 DIAGNOSIS — I129 Hypertensive chronic kidney disease with stage 1 through stage 4 chronic kidney disease, or unspecified chronic kidney disease: Secondary | ICD-10-CM | POA: Diagnosis not present

## 2016-07-31 DIAGNOSIS — L97521 Non-pressure chronic ulcer of other part of left foot limited to breakdown of skin: Secondary | ICD-10-CM | POA: Diagnosis not present

## 2016-07-31 DIAGNOSIS — E1151 Type 2 diabetes mellitus with diabetic peripheral angiopathy without gangrene: Secondary | ICD-10-CM | POA: Diagnosis not present

## 2016-08-01 ENCOUNTER — Other Ambulatory Visit: Payer: Self-pay | Admitting: Otolaryngology

## 2016-08-01 DIAGNOSIS — K118 Other diseases of salivary glands: Secondary | ICD-10-CM

## 2016-08-05 DIAGNOSIS — I129 Hypertensive chronic kidney disease with stage 1 through stage 4 chronic kidney disease, or unspecified chronic kidney disease: Secondary | ICD-10-CM | POA: Diagnosis not present

## 2016-08-05 DIAGNOSIS — E11621 Type 2 diabetes mellitus with foot ulcer: Secondary | ICD-10-CM | POA: Diagnosis not present

## 2016-08-05 DIAGNOSIS — L97521 Non-pressure chronic ulcer of other part of left foot limited to breakdown of skin: Secondary | ICD-10-CM | POA: Diagnosis not present

## 2016-08-05 DIAGNOSIS — E1140 Type 2 diabetes mellitus with diabetic neuropathy, unspecified: Secondary | ICD-10-CM | POA: Diagnosis not present

## 2016-08-05 DIAGNOSIS — E1151 Type 2 diabetes mellitus with diabetic peripheral angiopathy without gangrene: Secondary | ICD-10-CM | POA: Diagnosis not present

## 2016-08-06 ENCOUNTER — Ambulatory Visit
Admission: RE | Admit: 2016-08-06 | Discharge: 2016-08-06 | Disposition: A | Discharge status: Home / Self Care | Payer: Commercial Managed Care - HMO | Source: Ambulatory Visit | Attending: Otolaryngology | Admitting: Otolaryngology

## 2016-08-06 DIAGNOSIS — K118 Other diseases of salivary glands: Secondary | ICD-10-CM

## 2016-08-06 DIAGNOSIS — R221 Localized swelling, mass and lump, neck: Secondary | ICD-10-CM | POA: Diagnosis not present

## 2016-08-06 MED ORDER — IOPAMIDOL (ISOVUE-300) INJECTION 61%
60.0000 mL | Freq: Once | INTRAVENOUS | Status: AC | PRN
  Administered 2016-08-06: 60 mL via INTRAVENOUS

## 2016-08-07 DIAGNOSIS — E1151 Type 2 diabetes mellitus with diabetic peripheral angiopathy without gangrene: Secondary | ICD-10-CM | POA: Diagnosis not present

## 2016-08-07 DIAGNOSIS — E11621 Type 2 diabetes mellitus with foot ulcer: Secondary | ICD-10-CM | POA: Diagnosis not present

## 2016-08-07 DIAGNOSIS — E1140 Type 2 diabetes mellitus with diabetic neuropathy, unspecified: Secondary | ICD-10-CM | POA: Diagnosis not present

## 2016-08-07 DIAGNOSIS — I129 Hypertensive chronic kidney disease with stage 1 through stage 4 chronic kidney disease, or unspecified chronic kidney disease: Secondary | ICD-10-CM | POA: Diagnosis not present

## 2016-08-07 DIAGNOSIS — L97521 Non-pressure chronic ulcer of other part of left foot limited to breakdown of skin: Secondary | ICD-10-CM | POA: Diagnosis not present

## 2016-08-12 DIAGNOSIS — L97521 Non-pressure chronic ulcer of other part of left foot limited to breakdown of skin: Secondary | ICD-10-CM | POA: Diagnosis not present

## 2016-08-12 DIAGNOSIS — E1151 Type 2 diabetes mellitus with diabetic peripheral angiopathy without gangrene: Secondary | ICD-10-CM | POA: Diagnosis not present

## 2016-08-12 DIAGNOSIS — E11621 Type 2 diabetes mellitus with foot ulcer: Secondary | ICD-10-CM | POA: Diagnosis not present

## 2016-08-12 DIAGNOSIS — L03129 Acute lymphangitis of unspecified part of limb: Secondary | ICD-10-CM | POA: Diagnosis not present

## 2016-08-12 DIAGNOSIS — E1140 Type 2 diabetes mellitus with diabetic neuropathy, unspecified: Secondary | ICD-10-CM | POA: Diagnosis not present

## 2016-08-14 DIAGNOSIS — E1140 Type 2 diabetes mellitus with diabetic neuropathy, unspecified: Secondary | ICD-10-CM | POA: Diagnosis not present

## 2016-08-14 DIAGNOSIS — L97521 Non-pressure chronic ulcer of other part of left foot limited to breakdown of skin: Secondary | ICD-10-CM | POA: Diagnosis not present

## 2016-08-14 DIAGNOSIS — E11621 Type 2 diabetes mellitus with foot ulcer: Secondary | ICD-10-CM | POA: Diagnosis not present

## 2016-08-14 DIAGNOSIS — E1151 Type 2 diabetes mellitus with diabetic peripheral angiopathy without gangrene: Secondary | ICD-10-CM | POA: Diagnosis not present

## 2016-08-14 DIAGNOSIS — L03129 Acute lymphangitis of unspecified part of limb: Secondary | ICD-10-CM | POA: Diagnosis not present

## 2016-08-19 DIAGNOSIS — L603 Nail dystrophy: Secondary | ICD-10-CM | POA: Diagnosis not present

## 2016-08-19 DIAGNOSIS — I739 Peripheral vascular disease, unspecified: Secondary | ICD-10-CM | POA: Diagnosis not present

## 2016-08-19 DIAGNOSIS — L97522 Non-pressure chronic ulcer of other part of left foot with fat layer exposed: Secondary | ICD-10-CM | POA: Diagnosis not present

## 2016-08-19 DIAGNOSIS — E1069 Type 1 diabetes mellitus with other specified complication: Secondary | ICD-10-CM | POA: Diagnosis not present

## 2016-08-21 DIAGNOSIS — S91119A Laceration without foreign body of unspecified toe without damage to nail, initial encounter: Secondary | ICD-10-CM | POA: Diagnosis not present

## 2016-08-21 DIAGNOSIS — L03129 Acute lymphangitis of unspecified part of limb: Secondary | ICD-10-CM | POA: Diagnosis not present

## 2016-08-21 DIAGNOSIS — E119 Type 2 diabetes mellitus without complications: Secondary | ICD-10-CM | POA: Diagnosis not present

## 2016-08-21 DIAGNOSIS — E1140 Type 2 diabetes mellitus with diabetic neuropathy, unspecified: Secondary | ICD-10-CM | POA: Diagnosis not present

## 2016-08-21 DIAGNOSIS — E11621 Type 2 diabetes mellitus with foot ulcer: Secondary | ICD-10-CM | POA: Diagnosis not present

## 2016-08-21 DIAGNOSIS — E1151 Type 2 diabetes mellitus with diabetic peripheral angiopathy without gangrene: Secondary | ICD-10-CM | POA: Diagnosis not present

## 2016-08-21 DIAGNOSIS — L97521 Non-pressure chronic ulcer of other part of left foot limited to breakdown of skin: Secondary | ICD-10-CM | POA: Diagnosis not present

## 2016-08-27 DIAGNOSIS — E1140 Type 2 diabetes mellitus with diabetic neuropathy, unspecified: Secondary | ICD-10-CM | POA: Diagnosis not present

## 2016-08-27 DIAGNOSIS — L97521 Non-pressure chronic ulcer of other part of left foot limited to breakdown of skin: Secondary | ICD-10-CM | POA: Diagnosis not present

## 2016-08-27 DIAGNOSIS — L03129 Acute lymphangitis of unspecified part of limb: Secondary | ICD-10-CM | POA: Diagnosis not present

## 2016-08-27 DIAGNOSIS — E11621 Type 2 diabetes mellitus with foot ulcer: Secondary | ICD-10-CM | POA: Diagnosis not present

## 2016-08-27 DIAGNOSIS — E1151 Type 2 diabetes mellitus with diabetic peripheral angiopathy without gangrene: Secondary | ICD-10-CM | POA: Diagnosis not present

## 2016-08-28 DIAGNOSIS — L97521 Non-pressure chronic ulcer of other part of left foot limited to breakdown of skin: Secondary | ICD-10-CM | POA: Diagnosis not present

## 2016-08-28 DIAGNOSIS — L03129 Acute lymphangitis of unspecified part of limb: Secondary | ICD-10-CM | POA: Diagnosis not present

## 2016-08-28 DIAGNOSIS — E11621 Type 2 diabetes mellitus with foot ulcer: Secondary | ICD-10-CM | POA: Diagnosis not present

## 2016-08-28 DIAGNOSIS — E1151 Type 2 diabetes mellitus with diabetic peripheral angiopathy without gangrene: Secondary | ICD-10-CM | POA: Diagnosis not present

## 2016-08-28 DIAGNOSIS — E1140 Type 2 diabetes mellitus with diabetic neuropathy, unspecified: Secondary | ICD-10-CM | POA: Diagnosis not present

## 2016-09-02 DIAGNOSIS — N189 Chronic kidney disease, unspecified: Secondary | ICD-10-CM | POA: Diagnosis not present

## 2016-09-02 DIAGNOSIS — E1151 Type 2 diabetes mellitus with diabetic peripheral angiopathy without gangrene: Secondary | ICD-10-CM | POA: Diagnosis not present

## 2016-09-02 DIAGNOSIS — L98499 Non-pressure chronic ulcer of skin of other sites with unspecified severity: Secondary | ICD-10-CM | POA: Diagnosis not present

## 2016-09-02 DIAGNOSIS — Z7984 Long term (current) use of oral hypoglycemic drugs: Secondary | ICD-10-CM | POA: Diagnosis not present

## 2016-09-02 DIAGNOSIS — E11621 Type 2 diabetes mellitus with foot ulcer: Secondary | ICD-10-CM | POA: Diagnosis not present

## 2016-09-02 DIAGNOSIS — E1140 Type 2 diabetes mellitus with diabetic neuropathy, unspecified: Secondary | ICD-10-CM | POA: Diagnosis not present

## 2016-09-02 DIAGNOSIS — L03129 Acute lymphangitis of unspecified part of limb: Secondary | ICD-10-CM | POA: Diagnosis not present

## 2016-09-02 DIAGNOSIS — L97521 Non-pressure chronic ulcer of other part of left foot limited to breakdown of skin: Secondary | ICD-10-CM | POA: Diagnosis not present

## 2016-09-02 DIAGNOSIS — E1149 Type 2 diabetes mellitus with other diabetic neurological complication: Secondary | ICD-10-CM | POA: Diagnosis not present

## 2016-09-02 DIAGNOSIS — Z79899 Other long term (current) drug therapy: Secondary | ICD-10-CM | POA: Diagnosis not present

## 2016-09-02 DIAGNOSIS — E785 Hyperlipidemia, unspecified: Secondary | ICD-10-CM | POA: Diagnosis not present

## 2016-09-02 DIAGNOSIS — G629 Polyneuropathy, unspecified: Secondary | ICD-10-CM | POA: Diagnosis not present

## 2016-09-05 DIAGNOSIS — L03129 Acute lymphangitis of unspecified part of limb: Secondary | ICD-10-CM | POA: Diagnosis not present

## 2016-09-05 DIAGNOSIS — E11621 Type 2 diabetes mellitus with foot ulcer: Secondary | ICD-10-CM | POA: Diagnosis not present

## 2016-09-05 DIAGNOSIS — E1140 Type 2 diabetes mellitus with diabetic neuropathy, unspecified: Secondary | ICD-10-CM | POA: Diagnosis not present

## 2016-09-05 DIAGNOSIS — E1151 Type 2 diabetes mellitus with diabetic peripheral angiopathy without gangrene: Secondary | ICD-10-CM | POA: Diagnosis not present

## 2016-09-05 DIAGNOSIS — L97521 Non-pressure chronic ulcer of other part of left foot limited to breakdown of skin: Secondary | ICD-10-CM | POA: Diagnosis not present

## 2016-09-08 DIAGNOSIS — N189 Chronic kidney disease, unspecified: Secondary | ICD-10-CM | POA: Diagnosis not present

## 2016-09-08 DIAGNOSIS — G2581 Restless legs syndrome: Secondary | ICD-10-CM | POA: Diagnosis not present

## 2016-09-08 DIAGNOSIS — E1149 Type 2 diabetes mellitus with other diabetic neurological complication: Secondary | ICD-10-CM | POA: Diagnosis not present

## 2016-09-08 DIAGNOSIS — I10 Essential (primary) hypertension: Secondary | ICD-10-CM | POA: Diagnosis not present

## 2016-09-08 DIAGNOSIS — Z7984 Long term (current) use of oral hypoglycemic drugs: Secondary | ICD-10-CM | POA: Diagnosis not present

## 2016-09-08 DIAGNOSIS — E785 Hyperlipidemia, unspecified: Secondary | ICD-10-CM | POA: Diagnosis not present

## 2016-09-08 DIAGNOSIS — K219 Gastro-esophageal reflux disease without esophagitis: Secondary | ICD-10-CM | POA: Diagnosis not present

## 2016-09-08 DIAGNOSIS — Z23 Encounter for immunization: Secondary | ICD-10-CM | POA: Diagnosis not present

## 2016-09-08 DIAGNOSIS — Z0000 Encounter for general adult medical examination without abnormal findings: Secondary | ICD-10-CM | POA: Diagnosis not present

## 2016-09-11 DIAGNOSIS — E1151 Type 2 diabetes mellitus with diabetic peripheral angiopathy without gangrene: Secondary | ICD-10-CM | POA: Diagnosis not present

## 2016-09-11 DIAGNOSIS — L03129 Acute lymphangitis of unspecified part of limb: Secondary | ICD-10-CM | POA: Diagnosis not present

## 2016-09-11 DIAGNOSIS — E1140 Type 2 diabetes mellitus with diabetic neuropathy, unspecified: Secondary | ICD-10-CM | POA: Diagnosis not present

## 2016-09-11 DIAGNOSIS — L97521 Non-pressure chronic ulcer of other part of left foot limited to breakdown of skin: Secondary | ICD-10-CM | POA: Diagnosis not present

## 2016-09-11 DIAGNOSIS — E11621 Type 2 diabetes mellitus with foot ulcer: Secondary | ICD-10-CM | POA: Diagnosis not present

## 2016-09-16 DIAGNOSIS — L97521 Non-pressure chronic ulcer of other part of left foot limited to breakdown of skin: Secondary | ICD-10-CM | POA: Diagnosis not present

## 2016-09-16 DIAGNOSIS — E11621 Type 2 diabetes mellitus with foot ulcer: Secondary | ICD-10-CM | POA: Diagnosis not present

## 2016-09-16 DIAGNOSIS — N189 Chronic kidney disease, unspecified: Secondary | ICD-10-CM | POA: Diagnosis not present

## 2016-09-16 DIAGNOSIS — E1151 Type 2 diabetes mellitus with diabetic peripheral angiopathy without gangrene: Secondary | ICD-10-CM | POA: Diagnosis not present

## 2016-09-16 DIAGNOSIS — E1142 Type 2 diabetes mellitus with diabetic polyneuropathy: Secondary | ICD-10-CM | POA: Diagnosis not present

## 2016-09-16 DIAGNOSIS — I129 Hypertensive chronic kidney disease with stage 1 through stage 4 chronic kidney disease, or unspecified chronic kidney disease: Secondary | ICD-10-CM | POA: Diagnosis not present

## 2016-09-19 DIAGNOSIS — L97521 Non-pressure chronic ulcer of other part of left foot limited to breakdown of skin: Secondary | ICD-10-CM | POA: Diagnosis not present

## 2016-09-19 DIAGNOSIS — I129 Hypertensive chronic kidney disease with stage 1 through stage 4 chronic kidney disease, or unspecified chronic kidney disease: Secondary | ICD-10-CM | POA: Diagnosis not present

## 2016-09-19 DIAGNOSIS — E1142 Type 2 diabetes mellitus with diabetic polyneuropathy: Secondary | ICD-10-CM | POA: Diagnosis not present

## 2016-09-19 DIAGNOSIS — E11621 Type 2 diabetes mellitus with foot ulcer: Secondary | ICD-10-CM | POA: Diagnosis not present

## 2016-09-19 DIAGNOSIS — E1151 Type 2 diabetes mellitus with diabetic peripheral angiopathy without gangrene: Secondary | ICD-10-CM | POA: Diagnosis not present

## 2016-09-19 DIAGNOSIS — N189 Chronic kidney disease, unspecified: Secondary | ICD-10-CM | POA: Diagnosis not present

## 2016-09-23 DIAGNOSIS — E1151 Type 2 diabetes mellitus with diabetic peripheral angiopathy without gangrene: Secondary | ICD-10-CM | POA: Diagnosis not present

## 2016-09-23 DIAGNOSIS — E1142 Type 2 diabetes mellitus with diabetic polyneuropathy: Secondary | ICD-10-CM | POA: Diagnosis not present

## 2016-09-23 DIAGNOSIS — I129 Hypertensive chronic kidney disease with stage 1 through stage 4 chronic kidney disease, or unspecified chronic kidney disease: Secondary | ICD-10-CM | POA: Diagnosis not present

## 2016-09-23 DIAGNOSIS — L97521 Non-pressure chronic ulcer of other part of left foot limited to breakdown of skin: Secondary | ICD-10-CM | POA: Diagnosis not present

## 2016-09-23 DIAGNOSIS — N189 Chronic kidney disease, unspecified: Secondary | ICD-10-CM | POA: Diagnosis not present

## 2016-09-23 DIAGNOSIS — E11621 Type 2 diabetes mellitus with foot ulcer: Secondary | ICD-10-CM | POA: Diagnosis not present

## 2016-09-24 DIAGNOSIS — E11621 Type 2 diabetes mellitus with foot ulcer: Secondary | ICD-10-CM | POA: Diagnosis not present

## 2016-09-24 DIAGNOSIS — N189 Chronic kidney disease, unspecified: Secondary | ICD-10-CM | POA: Diagnosis not present

## 2016-09-24 DIAGNOSIS — L97521 Non-pressure chronic ulcer of other part of left foot limited to breakdown of skin: Secondary | ICD-10-CM | POA: Diagnosis not present

## 2016-09-24 DIAGNOSIS — E1142 Type 2 diabetes mellitus with diabetic polyneuropathy: Secondary | ICD-10-CM | POA: Diagnosis not present

## 2016-09-24 DIAGNOSIS — I129 Hypertensive chronic kidney disease with stage 1 through stage 4 chronic kidney disease, or unspecified chronic kidney disease: Secondary | ICD-10-CM | POA: Diagnosis not present

## 2016-09-24 DIAGNOSIS — E1151 Type 2 diabetes mellitus with diabetic peripheral angiopathy without gangrene: Secondary | ICD-10-CM | POA: Diagnosis not present

## 2016-09-30 DIAGNOSIS — E11621 Type 2 diabetes mellitus with foot ulcer: Secondary | ICD-10-CM | POA: Diagnosis not present

## 2016-09-30 DIAGNOSIS — N189 Chronic kidney disease, unspecified: Secondary | ICD-10-CM | POA: Diagnosis not present

## 2016-09-30 DIAGNOSIS — L97521 Non-pressure chronic ulcer of other part of left foot limited to breakdown of skin: Secondary | ICD-10-CM | POA: Diagnosis not present

## 2016-09-30 DIAGNOSIS — E1142 Type 2 diabetes mellitus with diabetic polyneuropathy: Secondary | ICD-10-CM | POA: Diagnosis not present

## 2016-09-30 DIAGNOSIS — I129 Hypertensive chronic kidney disease with stage 1 through stage 4 chronic kidney disease, or unspecified chronic kidney disease: Secondary | ICD-10-CM | POA: Diagnosis not present

## 2016-09-30 DIAGNOSIS — E1151 Type 2 diabetes mellitus with diabetic peripheral angiopathy without gangrene: Secondary | ICD-10-CM | POA: Diagnosis not present

## 2016-10-01 DIAGNOSIS — N189 Chronic kidney disease, unspecified: Secondary | ICD-10-CM | POA: Diagnosis not present

## 2016-10-01 DIAGNOSIS — I129 Hypertensive chronic kidney disease with stage 1 through stage 4 chronic kidney disease, or unspecified chronic kidney disease: Secondary | ICD-10-CM | POA: Diagnosis not present

## 2016-10-01 DIAGNOSIS — E1151 Type 2 diabetes mellitus with diabetic peripheral angiopathy without gangrene: Secondary | ICD-10-CM | POA: Diagnosis not present

## 2016-10-01 DIAGNOSIS — E1142 Type 2 diabetes mellitus with diabetic polyneuropathy: Secondary | ICD-10-CM | POA: Diagnosis not present

## 2016-10-01 DIAGNOSIS — E11621 Type 2 diabetes mellitus with foot ulcer: Secondary | ICD-10-CM | POA: Diagnosis not present

## 2016-10-01 DIAGNOSIS — L97521 Non-pressure chronic ulcer of other part of left foot limited to breakdown of skin: Secondary | ICD-10-CM | POA: Diagnosis not present

## 2016-10-03 DIAGNOSIS — E1151 Type 2 diabetes mellitus with diabetic peripheral angiopathy without gangrene: Secondary | ICD-10-CM | POA: Diagnosis not present

## 2016-10-03 DIAGNOSIS — E1142 Type 2 diabetes mellitus with diabetic polyneuropathy: Secondary | ICD-10-CM | POA: Diagnosis not present

## 2016-10-03 DIAGNOSIS — I129 Hypertensive chronic kidney disease with stage 1 through stage 4 chronic kidney disease, or unspecified chronic kidney disease: Secondary | ICD-10-CM | POA: Diagnosis not present

## 2016-10-03 DIAGNOSIS — N189 Chronic kidney disease, unspecified: Secondary | ICD-10-CM | POA: Diagnosis not present

## 2016-10-03 DIAGNOSIS — E11621 Type 2 diabetes mellitus with foot ulcer: Secondary | ICD-10-CM | POA: Diagnosis not present

## 2016-10-03 DIAGNOSIS — L97521 Non-pressure chronic ulcer of other part of left foot limited to breakdown of skin: Secondary | ICD-10-CM | POA: Diagnosis not present

## 2016-10-06 DIAGNOSIS — I129 Hypertensive chronic kidney disease with stage 1 through stage 4 chronic kidney disease, or unspecified chronic kidney disease: Secondary | ICD-10-CM | POA: Diagnosis not present

## 2016-10-06 DIAGNOSIS — E1142 Type 2 diabetes mellitus with diabetic polyneuropathy: Secondary | ICD-10-CM | POA: Diagnosis not present

## 2016-10-06 DIAGNOSIS — N189 Chronic kidney disease, unspecified: Secondary | ICD-10-CM | POA: Diagnosis not present

## 2016-10-06 DIAGNOSIS — E1151 Type 2 diabetes mellitus with diabetic peripheral angiopathy without gangrene: Secondary | ICD-10-CM | POA: Diagnosis not present

## 2016-10-06 DIAGNOSIS — E11621 Type 2 diabetes mellitus with foot ulcer: Secondary | ICD-10-CM | POA: Diagnosis not present

## 2016-10-06 DIAGNOSIS — L97521 Non-pressure chronic ulcer of other part of left foot limited to breakdown of skin: Secondary | ICD-10-CM | POA: Diagnosis not present

## 2016-10-08 DIAGNOSIS — I129 Hypertensive chronic kidney disease with stage 1 through stage 4 chronic kidney disease, or unspecified chronic kidney disease: Secondary | ICD-10-CM | POA: Diagnosis not present

## 2016-10-08 DIAGNOSIS — E11621 Type 2 diabetes mellitus with foot ulcer: Secondary | ICD-10-CM | POA: Diagnosis not present

## 2016-10-08 DIAGNOSIS — L97521 Non-pressure chronic ulcer of other part of left foot limited to breakdown of skin: Secondary | ICD-10-CM | POA: Diagnosis not present

## 2016-10-08 DIAGNOSIS — E1142 Type 2 diabetes mellitus with diabetic polyneuropathy: Secondary | ICD-10-CM | POA: Diagnosis not present

## 2016-10-08 DIAGNOSIS — E1151 Type 2 diabetes mellitus with diabetic peripheral angiopathy without gangrene: Secondary | ICD-10-CM | POA: Diagnosis not present

## 2016-10-08 DIAGNOSIS — N189 Chronic kidney disease, unspecified: Secondary | ICD-10-CM | POA: Diagnosis not present

## 2016-10-09 DIAGNOSIS — N189 Chronic kidney disease, unspecified: Secondary | ICD-10-CM | POA: Diagnosis not present

## 2016-10-09 DIAGNOSIS — E1142 Type 2 diabetes mellitus with diabetic polyneuropathy: Secondary | ICD-10-CM | POA: Diagnosis not present

## 2016-10-09 DIAGNOSIS — E11621 Type 2 diabetes mellitus with foot ulcer: Secondary | ICD-10-CM | POA: Diagnosis not present

## 2016-10-09 DIAGNOSIS — E1151 Type 2 diabetes mellitus with diabetic peripheral angiopathy without gangrene: Secondary | ICD-10-CM | POA: Diagnosis not present

## 2016-10-09 DIAGNOSIS — L97521 Non-pressure chronic ulcer of other part of left foot limited to breakdown of skin: Secondary | ICD-10-CM | POA: Diagnosis not present

## 2016-10-09 DIAGNOSIS — I129 Hypertensive chronic kidney disease with stage 1 through stage 4 chronic kidney disease, or unspecified chronic kidney disease: Secondary | ICD-10-CM | POA: Diagnosis not present

## 2016-10-10 DIAGNOSIS — L97521 Non-pressure chronic ulcer of other part of left foot limited to breakdown of skin: Secondary | ICD-10-CM | POA: Diagnosis not present

## 2016-10-10 DIAGNOSIS — N189 Chronic kidney disease, unspecified: Secondary | ICD-10-CM | POA: Diagnosis not present

## 2016-10-10 DIAGNOSIS — E1142 Type 2 diabetes mellitus with diabetic polyneuropathy: Secondary | ICD-10-CM | POA: Diagnosis not present

## 2016-10-10 DIAGNOSIS — I129 Hypertensive chronic kidney disease with stage 1 through stage 4 chronic kidney disease, or unspecified chronic kidney disease: Secondary | ICD-10-CM | POA: Diagnosis not present

## 2016-10-10 DIAGNOSIS — E1151 Type 2 diabetes mellitus with diabetic peripheral angiopathy without gangrene: Secondary | ICD-10-CM | POA: Diagnosis not present

## 2016-10-10 DIAGNOSIS — E11621 Type 2 diabetes mellitus with foot ulcer: Secondary | ICD-10-CM | POA: Diagnosis not present

## 2016-10-14 DIAGNOSIS — I129 Hypertensive chronic kidney disease with stage 1 through stage 4 chronic kidney disease, or unspecified chronic kidney disease: Secondary | ICD-10-CM | POA: Diagnosis not present

## 2016-10-14 DIAGNOSIS — E11621 Type 2 diabetes mellitus with foot ulcer: Secondary | ICD-10-CM | POA: Diagnosis not present

## 2016-10-14 DIAGNOSIS — E1142 Type 2 diabetes mellitus with diabetic polyneuropathy: Secondary | ICD-10-CM | POA: Diagnosis not present

## 2016-10-14 DIAGNOSIS — E1151 Type 2 diabetes mellitus with diabetic peripheral angiopathy without gangrene: Secondary | ICD-10-CM | POA: Diagnosis not present

## 2016-10-14 DIAGNOSIS — N189 Chronic kidney disease, unspecified: Secondary | ICD-10-CM | POA: Diagnosis not present

## 2016-10-14 DIAGNOSIS — L97521 Non-pressure chronic ulcer of other part of left foot limited to breakdown of skin: Secondary | ICD-10-CM | POA: Diagnosis not present

## 2016-10-15 DIAGNOSIS — L97521 Non-pressure chronic ulcer of other part of left foot limited to breakdown of skin: Secondary | ICD-10-CM | POA: Diagnosis not present

## 2016-10-15 DIAGNOSIS — E11621 Type 2 diabetes mellitus with foot ulcer: Secondary | ICD-10-CM | POA: Diagnosis not present

## 2016-10-15 DIAGNOSIS — I129 Hypertensive chronic kidney disease with stage 1 through stage 4 chronic kidney disease, or unspecified chronic kidney disease: Secondary | ICD-10-CM | POA: Diagnosis not present

## 2016-10-15 DIAGNOSIS — E1142 Type 2 diabetes mellitus with diabetic polyneuropathy: Secondary | ICD-10-CM | POA: Diagnosis not present

## 2016-10-15 DIAGNOSIS — N189 Chronic kidney disease, unspecified: Secondary | ICD-10-CM | POA: Diagnosis not present

## 2016-10-15 DIAGNOSIS — E1151 Type 2 diabetes mellitus with diabetic peripheral angiopathy without gangrene: Secondary | ICD-10-CM | POA: Diagnosis not present

## 2016-10-17 DIAGNOSIS — E1142 Type 2 diabetes mellitus with diabetic polyneuropathy: Secondary | ICD-10-CM | POA: Diagnosis not present

## 2016-10-17 DIAGNOSIS — N189 Chronic kidney disease, unspecified: Secondary | ICD-10-CM | POA: Diagnosis not present

## 2016-10-17 DIAGNOSIS — I129 Hypertensive chronic kidney disease with stage 1 through stage 4 chronic kidney disease, or unspecified chronic kidney disease: Secondary | ICD-10-CM | POA: Diagnosis not present

## 2016-10-17 DIAGNOSIS — E1151 Type 2 diabetes mellitus with diabetic peripheral angiopathy without gangrene: Secondary | ICD-10-CM | POA: Diagnosis not present

## 2016-10-17 DIAGNOSIS — E11621 Type 2 diabetes mellitus with foot ulcer: Secondary | ICD-10-CM | POA: Diagnosis not present

## 2016-10-17 DIAGNOSIS — L97521 Non-pressure chronic ulcer of other part of left foot limited to breakdown of skin: Secondary | ICD-10-CM | POA: Diagnosis not present

## 2016-10-21 ENCOUNTER — Observation Stay (HOSPITAL_COMMUNITY)
Admission: EM | Admit: 2016-10-21 | Discharge: 2016-10-22 | Disposition: A | Discharge status: Home / Self Care | Payer: Medicare HMO | Attending: Cardiovascular Disease | Admitting: Cardiovascular Disease

## 2016-10-21 ENCOUNTER — Encounter (HOSPITAL_COMMUNITY): Payer: Self-pay

## 2016-10-21 ENCOUNTER — Emergency Department (HOSPITAL_COMMUNITY): Payer: Medicare HMO

## 2016-10-21 DIAGNOSIS — R079 Chest pain, unspecified: Secondary | ICD-10-CM

## 2016-10-21 DIAGNOSIS — I161 Hypertensive emergency: Secondary | ICD-10-CM

## 2016-10-21 DIAGNOSIS — K219 Gastro-esophageal reflux disease without esophagitis: Secondary | ICD-10-CM | POA: Diagnosis not present

## 2016-10-21 DIAGNOSIS — Z87891 Personal history of nicotine dependence: Secondary | ICD-10-CM | POA: Diagnosis not present

## 2016-10-21 DIAGNOSIS — G2581 Restless legs syndrome: Secondary | ICD-10-CM | POA: Insufficient documentation

## 2016-10-21 DIAGNOSIS — I491 Atrial premature depolarization: Secondary | ICD-10-CM | POA: Insufficient documentation

## 2016-10-21 DIAGNOSIS — Z794 Long term (current) use of insulin: Secondary | ICD-10-CM | POA: Insufficient documentation

## 2016-10-21 DIAGNOSIS — N189 Chronic kidney disease, unspecified: Secondary | ICD-10-CM | POA: Insufficient documentation

## 2016-10-21 DIAGNOSIS — E1122 Type 2 diabetes mellitus with diabetic chronic kidney disease: Secondary | ICD-10-CM | POA: Insufficient documentation

## 2016-10-21 DIAGNOSIS — I160 Hypertensive urgency: Secondary | ICD-10-CM | POA: Diagnosis not present

## 2016-10-21 DIAGNOSIS — E559 Vitamin D deficiency, unspecified: Secondary | ICD-10-CM | POA: Insufficient documentation

## 2016-10-21 DIAGNOSIS — Z8249 Family history of ischemic heart disease and other diseases of the circulatory system: Secondary | ICD-10-CM | POA: Insufficient documentation

## 2016-10-21 DIAGNOSIS — I129 Hypertensive chronic kidney disease with stage 1 through stage 4 chronic kidney disease, or unspecified chronic kidney disease: Secondary | ICD-10-CM | POA: Insufficient documentation

## 2016-10-21 DIAGNOSIS — X58XXXA Exposure to other specified factors, initial encounter: Secondary | ICD-10-CM | POA: Insufficient documentation

## 2016-10-21 DIAGNOSIS — Z9841 Cataract extraction status, right eye: Secondary | ICD-10-CM | POA: Diagnosis not present

## 2016-10-21 DIAGNOSIS — Z8673 Personal history of transient ischemic attack (TIA), and cerebral infarction without residual deficits: Secondary | ICD-10-CM | POA: Diagnosis not present

## 2016-10-21 DIAGNOSIS — R0789 Other chest pain: Secondary | ICD-10-CM | POA: Diagnosis present

## 2016-10-21 DIAGNOSIS — I4891 Unspecified atrial fibrillation: Secondary | ICD-10-CM | POA: Diagnosis not present

## 2016-10-21 DIAGNOSIS — Z809 Family history of malignant neoplasm, unspecified: Secondary | ICD-10-CM | POA: Insufficient documentation

## 2016-10-21 DIAGNOSIS — R531 Weakness: Secondary | ICD-10-CM | POA: Diagnosis not present

## 2016-10-21 DIAGNOSIS — I739 Peripheral vascular disease, unspecified: Secondary | ICD-10-CM | POA: Diagnosis not present

## 2016-10-21 DIAGNOSIS — E785 Hyperlipidemia, unspecified: Secondary | ICD-10-CM | POA: Diagnosis not present

## 2016-10-21 DIAGNOSIS — Z90710 Acquired absence of both cervix and uterus: Secondary | ICD-10-CM | POA: Diagnosis not present

## 2016-10-21 DIAGNOSIS — I889 Nonspecific lymphadenitis, unspecified: Secondary | ICD-10-CM | POA: Diagnosis not present

## 2016-10-21 DIAGNOSIS — Z888 Allergy status to other drugs, medicaments and biological substances status: Secondary | ICD-10-CM | POA: Insufficient documentation

## 2016-10-21 DIAGNOSIS — I10 Essential (primary) hypertension: Secondary | ICD-10-CM | POA: Diagnosis not present

## 2016-10-21 DIAGNOSIS — S91302A Unspecified open wound, left foot, initial encounter: Secondary | ICD-10-CM | POA: Insufficient documentation

## 2016-10-21 DIAGNOSIS — D509 Iron deficiency anemia, unspecified: Secondary | ICD-10-CM | POA: Insufficient documentation

## 2016-10-21 DIAGNOSIS — I700 Atherosclerosis of aorta: Secondary | ICD-10-CM | POA: Insufficient documentation

## 2016-10-21 DIAGNOSIS — Z79899 Other long term (current) drug therapy: Secondary | ICD-10-CM | POA: Insufficient documentation

## 2016-10-21 DIAGNOSIS — I480 Paroxysmal atrial fibrillation: Secondary | ICD-10-CM | POA: Diagnosis not present

## 2016-10-21 DIAGNOSIS — E1151 Type 2 diabetes mellitus with diabetic peripheral angiopathy without gangrene: Secondary | ICD-10-CM | POA: Insufficient documentation

## 2016-10-21 DIAGNOSIS — G629 Polyneuropathy, unspecified: Secondary | ICD-10-CM | POA: Diagnosis not present

## 2016-10-21 DIAGNOSIS — Z9842 Cataract extraction status, left eye: Secondary | ICD-10-CM | POA: Diagnosis not present

## 2016-10-21 DIAGNOSIS — E1149 Type 2 diabetes mellitus with other diabetic neurological complication: Secondary | ICD-10-CM | POA: Diagnosis not present

## 2016-10-21 DIAGNOSIS — Z89412 Acquired absence of left great toe: Secondary | ICD-10-CM | POA: Insufficient documentation

## 2016-10-21 LAB — CBC
HEMATOCRIT: 40.8 % (ref 36.0–46.0)
Hemoglobin: 13.2 g/dL (ref 12.0–15.0)
MCH: 28.8 pg (ref 26.0–34.0)
MCHC: 32.4 g/dL (ref 30.0–36.0)
MCV: 89.1 fL (ref 78.0–100.0)
PLATELETS: 245 10*3/uL (ref 150–400)
RBC: 4.58 MIL/uL (ref 3.87–5.11)
RDW: 13.5 % (ref 11.5–15.5)
WBC: 10.1 10*3/uL (ref 4.0–10.5)

## 2016-10-21 LAB — HEPATIC FUNCTION PANEL
ALBUMIN: 2.9 g/dL — AB (ref 3.5–5.0)
ALK PHOS: 66 U/L (ref 38–126)
ALT: 15 U/L (ref 14–54)
AST: 20 U/L (ref 15–41)
BILIRUBIN TOTAL: 0.5 mg/dL (ref 0.3–1.2)
Bilirubin, Direct: <0.1 mg/dL — ABNORMAL LOW (ref 0.1–0.5)
TOTAL PROTEIN: 6.7 g/dL (ref 6.5–8.1)

## 2016-10-21 LAB — URINALYSIS, ROUTINE W REFLEX MICROSCOPIC
Bacteria, UA: NONE SEEN
Bilirubin Urine: NEGATIVE
GLUCOSE, UA: NEGATIVE mg/dL
HGB URINE DIPSTICK: NEGATIVE
Ketones, ur: NEGATIVE mg/dL
Leukocytes, UA: NEGATIVE
Nitrite: NEGATIVE
PH: 6 (ref 5.0–8.0)
PROTEIN: 30 mg/dL — AB
Specific Gravity, Urine: 1.009 (ref 1.005–1.030)

## 2016-10-21 LAB — BASIC METABOLIC PANEL
Anion gap: 8 (ref 5–15)
BUN: 22 mg/dL — AB (ref 6–20)
CHLORIDE: 102 mmol/L (ref 101–111)
CO2: 27 mmol/L (ref 22–32)
CREATININE: 1.18 mg/dL — AB (ref 0.44–1.00)
Calcium: 9.6 mg/dL (ref 8.9–10.3)
GFR calc Af Amer: 50 mL/min — ABNORMAL LOW (ref >60)
GFR calc non Af Amer: 43 mL/min — ABNORMAL LOW (ref >60)
Glucose, Bld: 135 mg/dL — ABNORMAL HIGH (ref 65–99)
POTASSIUM: 4.3 mmol/L (ref 3.5–5.1)
SODIUM: 137 mmol/L (ref 135–145)

## 2016-10-21 LAB — I-STAT TROPONIN, ED: TROPONIN I, POC: 0 ng/mL (ref 0.00–0.08)

## 2016-10-21 LAB — GLUCOSE, CAPILLARY
GLUCOSE-CAPILLARY: 171 mg/dL — AB (ref 65–99)
Glucose-Capillary: 234 mg/dL — ABNORMAL HIGH (ref 65–99)

## 2016-10-21 LAB — PROTIME-INR
INR: 0.96
INR: 0.97
PROTHROMBIN TIME: 12.9 s (ref 11.4–15.2)
Prothrombin Time: 12.8 seconds (ref 11.4–15.2)

## 2016-10-21 LAB — TSH: TSH: 6.254 u[IU]/mL — ABNORMAL HIGH (ref 0.350–4.500)

## 2016-10-21 LAB — T4, FREE: FREE T4: 0.93 ng/dL (ref 0.61–1.12)

## 2016-10-21 LAB — TROPONIN I: Troponin I: <0.03 ng/mL (ref <0.03)

## 2016-10-21 LAB — MAGNESIUM: Magnesium: 1.6 mg/dL — ABNORMAL LOW (ref 1.7–2.4)

## 2016-10-21 LAB — BRAIN NATRIURETIC PEPTIDE: B Natriuretic Peptide: 295.2 pg/mL — ABNORMAL HIGH (ref 0.0–100.0)

## 2016-10-21 MED ORDER — INSULIN ASPART 100 UNIT/ML ~~LOC~~ SOLN
0.0000 [IU] | Freq: Three times a day (TID) | SUBCUTANEOUS | Status: DC
  Administered 2016-10-22: 1 [IU] via SUBCUTANEOUS

## 2016-10-21 MED ORDER — ASPIRIN EC 81 MG PO TBEC
81.0000 mg | DELAYED_RELEASE_TABLET | Freq: Every day | ORAL | Status: DC
  Administered 2016-10-22: 81 mg via ORAL
  Filled 2016-10-21: qty 1

## 2016-10-21 MED ORDER — SODIUM CHLORIDE 0.9% FLUSH
3.0000 mL | Freq: Two times a day (BID) | INTRAVENOUS | Status: DC
  Administered 2016-10-21: 3 mL via INTRAVENOUS

## 2016-10-21 MED ORDER — HYDROCODONE-ACETAMINOPHEN 5-325 MG PO TABS: 1.0000 | ORAL_TABLET | ORAL | Status: DC | PRN

## 2016-10-21 MED ORDER — ATORVASTATIN CALCIUM 20 MG PO TABS
20.0000 mg | ORAL_TABLET | Freq: Every day | ORAL | Status: DC
  Administered 2016-10-21: 20 mg via ORAL
  Filled 2016-10-21 (×2): qty 1

## 2016-10-21 MED ORDER — LOSARTAN POTASSIUM 50 MG PO TABS
100.0000 mg | ORAL_TABLET | Freq: Every day | ORAL | Status: DC
  Administered 2016-10-22: 100 mg via ORAL
  Filled 2016-10-21: qty 2

## 2016-10-21 MED ORDER — ASPIRIN EC 81 MG PO TBEC: 81.0000 mg | DELAYED_RELEASE_TABLET | Freq: Every day | ORAL | Status: DC

## 2016-10-21 MED ORDER — SIMVASTATIN 40 MG PO TABS: 40.0000 mg | ORAL_TABLET | Freq: Every evening | ORAL | Status: DC

## 2016-10-21 MED ORDER — METOPROLOL TARTRATE 50 MG PO TABS
50.0000 mg | ORAL_TABLET | Freq: Two times a day (BID) | ORAL | Status: DC
  Administered 2016-10-22 (×2): 50 mg via ORAL
  Filled 2016-10-21 (×2): qty 1

## 2016-10-21 MED ORDER — SODIUM CHLORIDE 0.9% FLUSH: 3.0000 mL | INTRAVENOUS | Status: DC | PRN

## 2016-10-21 MED ORDER — ASPIRIN 81 MG PO CHEW
324.0000 mg | CHEWABLE_TABLET | ORAL | Status: AC
  Filled 2016-10-21: qty 4

## 2016-10-21 MED ORDER — NITROGLYCERIN 0.4 MG SL SUBL
0.4000 mg | SUBLINGUAL_TABLET | SUBLINGUAL | Status: DC | PRN
Start: 2016-10-21 — End: 2016-10-22

## 2016-10-21 MED ORDER — HEPARIN SODIUM (PORCINE) 5000 UNIT/ML IJ SOLN
5000.0000 [IU] | Freq: Three times a day (TID) | INTRAMUSCULAR | Status: DC
  Administered 2016-10-21 – 2016-10-22 (×3): 5000 [IU] via SUBCUTANEOUS
  Filled 2016-10-21 (×3): qty 1

## 2016-10-21 MED ORDER — FERROUS SULFATE 325 (65 FE) MG PO TABS
325.0000 mg | ORAL_TABLET | Freq: Two times a day (BID) | ORAL | Status: DC
  Administered 2016-10-21 – 2016-10-22 (×2): 325 mg via ORAL
  Filled 2016-10-21 (×2): qty 1

## 2016-10-21 MED ORDER — TRAMADOL HCL 50 MG PO TABS
50.0000 mg | ORAL_TABLET | Freq: Three times a day (TID) | ORAL | Status: DC
  Administered 2016-10-21 – 2016-10-22 (×2): 50 mg via ORAL
  Filled 2016-10-21 (×2): qty 1

## 2016-10-21 MED ORDER — LINAGLIPTIN 5 MG PO TABS
5.0000 mg | ORAL_TABLET | Freq: Every day | ORAL | Status: DC
  Administered 2016-10-22: 5 mg via ORAL
  Filled 2016-10-21 (×2): qty 1

## 2016-10-21 MED ORDER — PANTOPRAZOLE SODIUM 40 MG PO TBEC
40.0000 mg | DELAYED_RELEASE_TABLET | Freq: Every day | ORAL | Status: DC
  Administered 2016-10-22: 40 mg via ORAL
  Filled 2016-10-21: qty 1

## 2016-10-21 MED ORDER — AMLODIPINE BESYLATE 5 MG PO TABS
5.0000 mg | ORAL_TABLET | Freq: Every day | ORAL | Status: DC
  Administered 2016-10-22: 5 mg via ORAL
  Filled 2016-10-21: qty 1

## 2016-10-21 MED ORDER — ACETAMINOPHEN 325 MG PO TABS: 650.0000 mg | ORAL_TABLET | ORAL | Status: DC | PRN

## 2016-10-21 MED ORDER — AMLODIPINE BESYLATE 5 MG PO TABS
2.5000 mg | ORAL_TABLET | Freq: Every day | ORAL | Status: DC
Start: 2016-10-21 — End: 2016-10-21

## 2016-10-21 MED ORDER — FOLIC ACID 1 MG PO TABS
0.5000 mg | ORAL_TABLET | Freq: Every day | ORAL | Status: DC
  Administered 2016-10-21 – 2016-10-22 (×2): 0.5 mg via ORAL
  Filled 2016-10-21 (×2): qty 1

## 2016-10-21 MED ORDER — VITAMIN D 1000 UNITS PO TABS
1000.0000 [IU] | ORAL_TABLET | Freq: Every day | ORAL | Status: DC
  Administered 2016-10-21 – 2016-10-22 (×2): 1000 [IU] via ORAL
  Filled 2016-10-21 (×2): qty 1

## 2016-10-21 MED ORDER — OMEGA-3-ACID ETHYL ESTERS 1 G PO CAPS
1000.0000 mg | ORAL_CAPSULE | Freq: Three times a day (TID) | ORAL | Status: DC
  Administered 2016-10-21 – 2016-10-22 (×2): 1000 mg via ORAL
  Filled 2016-10-21 (×2): qty 1

## 2016-10-21 MED ORDER — SODIUM CHLORIDE 0.9 % IV SOLN: 250.0000 mL | INTRAVENOUS | Status: DC | PRN

## 2016-10-21 MED ORDER — ASPIRIN 300 MG RE SUPP: 300.0000 mg | RECTAL | Status: AC

## 2016-10-21 MED ORDER — METOPROLOL TARTRATE 25 MG PO TABS
50.0000 mg | ORAL_TABLET | Freq: Once | ORAL | Status: AC
  Administered 2016-10-21: 50 mg via ORAL
  Filled 2016-10-21 (×2): qty 2

## 2016-10-21 MED ORDER — ROPINIROLE HCL ER 6 MG PO TB24: 6.0000 mg | ORAL_TABLET | Freq: Every day | ORAL | Status: DC

## 2016-10-21 MED ORDER — ONDANSETRON HCL 4 MG/2ML IJ SOLN: 4.0000 mg | Freq: Four times a day (QID) | INTRAMUSCULAR | Status: DC | PRN

## 2016-10-21 NOTE — ED Notes (Signed)
PT taken a meal. PT assisted to bedside toilet by Saralyn Pilar, ED tech

## 2016-10-21 NOTE — ED Triage Notes (Signed)
Patient sent from doctors office for new onset atrial fib. States that she went there for foot pain initially. Describes the fluttering as an intermittent discomfort, no shortness of breath

## 2016-10-21 NOTE — ED Provider Notes (Signed)
[facility] DEPT Provider Note   CSN: [id number] Arrival date & time: 10/21/16  1107     History   Chief Complaint No chief complaint on file.   HPI Nnenna Huppe is a 81 y.o. female with history of type II diabetes, hypertension and CKD that presents to the ED with Chest discomfort.  She first noticed the chest discomfort last night while fixing her bandage on her foot and again this morning.  She visited her primary care doctor this morning and was told she had new onset Afib and was sent to the ED.  She has associated symptoms of heart palpitations.  Patient denies shortness of breath, dizziness, nausea or vomiting.    HPI  Past Medical History:  Diagnosis Date  . CKD (chronic kidney disease)   . Diabetes mellitus without complication ([place])   . GERD (gastroesophageal reflux disease)   . HLD (hyperlipidemia)   . Hypertension   . IDA (iron deficiency anemia)   . PVD (peripheral vascular disease) ([place])   . RLS (restless legs syndrome)   . TIA (transient ischemic attack)   . Uses walker   . Vitamin D deficiency     Patient Active Problem List   Diagnosis Date Noted  . Type 2 diabetes mellitus without complication, without long-term current use of insulin ([place])   . RLS (restless legs syndrome) 07/01/2016  . Cervical lymphadenitis 07/01/2016  . Diabetes mellitus ([place]) 07/01/2016  . GERD (gastroesophageal reflux disease) 07/01/2016  . HLD (hyperlipidemia) 07/01/2016  . HTN (hypertension) 07/01/2016  . Lymphadenitis 07/01/2016    Past Surgical History:  Procedure Laterality Date  . ABDOMINAL HYSTERECTOMY     with bladder tac  . APPENDECTOMY    . CARPAL TUNNEL RELEASE Right   . CATARACT EXTRACTION, BILATERAL    . CERVICAL DISC SURGERY    . COLONOSCOPY    . DILATION AND CURETTAGE OF UTERUS     x 2  . left great toe amputation    . ROTATOR CUFF REPAIR Left     OB History    No data available       Home Medications    Prior to Admission medications     Medication Sig Start Date End Date Taking? Authorizing Provider  aspirin EC 81 MG tablet Take 81 mg by mouth daily.    Historical Provider, MD  cholecalciferol (VITAMIN D) 1000 units tablet Take 1,000 Units by mouth daily.    Historical Provider, MD  ferrous sulfate 325 (65 FE) MG tablet Take 325 mg by mouth 2 (two) times daily. 06/28/16   Historical Provider, MD  folic acid (FOLVITE) 1 MG tablet Take 0.5 tablets (0.5 mg total) by mouth daily. 07/05/16   Eugenie Filler, MD  HYDROcodone-acetaminophen (NORCO/VICODIN) 5-325 MG tablet Take 1-2 tablets by mouth every 4 (four) hours as needed for moderate pain. 07/04/16   Eugenie Filler, MD  losartan (COZAAR) 100 MG tablet Take 100 mg by mouth daily. 05/30/16   Historical Provider, MD  metoprolol tartrate (LOPRESSOR) 50 MG tablet Take 1 tablet (50 mg total) by mouth 2 (two) times daily with a meal. 07/04/16   Eugenie Filler, MD  omega-3 acid ethyl esters (LOVAZA) 1 g capsule Take 1,000 mg by mouth 3 (three) times daily. 06/28/16   Historical Provider, MD  omeprazole (PRILOSEC) 40 MG capsule Take 40 mg by mouth daily. 06/28/16   Historical Provider, MD  OVER THE COUNTER MEDICATION Place 1 drop into both eyes as needed (dry eyes). Over  the counter dry eye drops.    Historical Provider, MD  Ropinirole HCl 6 MG TB24 Take 6 mg by mouth daily. 05/09/16   Historical Provider, MD  simvastatin (ZOCOR) 40 MG tablet Take 40 mg by mouth every evening. 05/03/16   Historical Provider, MD  TRADJENTA 5 MG TABS tablet Take 5 mg by mouth daily. 06/06/16   Historical Provider, MD  traMADol (ULTRAM) 50 MG tablet Take 50 mg by mouth 3 (three) times daily. 06/05/16   Historical Provider, MD  vitamin E 400 UNIT capsule Take 400 Units by mouth daily.    Historical Provider, MD    Family History No family history on file.  Social History Social History  Substance Use Topics  . Smoking status: Former Smoker    Years: 30.00    Types: Cigarettes  . Smokeless tobacco:  Former Systems developer    Types: Snuff    Quit date: 07/01/1954  . Alcohol use No     Allergies   Cymbalta [duloxetine hcl]; Librax [chlordiazepoxide-clidinium]; Lyrica [pregabalin]; and Naproxen   Review of Systems Review of Systems  Respiratory: Negative for shortness of breath.   Cardiovascular: Positive for chest pain.  Gastrointestinal: Negative for abdominal pain.  Musculoskeletal: Negative for back pain.  Neurological: Negative for dizziness.     Physical Exam Updated Vital Signs BP (!) 186/107   Pulse 76   Temp 98 F (36.7 C) (Oral)   Resp 18   Ht 4\' 10"  (1.473 m)   Wt 61.1 kg   LMP  (LMP Unknown)   SpO2 98%   BMI 28.13 kg/m   Physical Exam  Constitutional: No distress.  Cardiovascular: Normal rate and normal heart sounds.  Exam reveals no gallop and no friction rub.   No murmur heard. Irregular rhythm  Pulmonary/Chest: Effort normal and breath sounds normal. No respiratory distress. She has no wheezes. She has no rales.  Abdominal: Soft. She exhibits no distension. There is no tenderness.  Skin: Skin is warm and dry.     ED Treatments / Results  Labs (all labs ordered are listed, but only abnormal results are displayed) Labs Reviewed  BASIC METABOLIC PANEL - Abnormal; Notable for the following:       Result Value   Glucose, Bld 135 (*)    BUN 22 (*)    Creatinine, Ser 1.18 (*)    GFR calc non Af Amer 43 (*)    GFR calc Af Amer 50 (*)    All other components within normal limits  URINALYSIS, ROUTINE W REFLEX MICROSCOPIC - Abnormal; Notable for the following:    Color, Urine STRAW (*)    Protein, ur 30 (*)    Squamous Epithelial / LPF 0-5 (*)    All other components within normal limits  BRAIN NATRIURETIC PEPTIDE - Abnormal; Notable for the following:    B Natriuretic Peptide 295.2 (*)    All other components within normal limits  CBC  PROTIME-INR  I-STAT TROPOININ, ED    EKG  EKG Interpretation  Date/Time:  Tuesday October 21 2016 11:10:53  EST Ventricular Rate:  70 PR Interval:  118 QRS Duration: 82 QT Interval:  370 QTC Calculation: 399 R Axis:   76 Text Interpretation:  Sinus rhythm with Premature atrial complexes Otherwise normal ECG No previous ECGs available Confirmed by [facility] MD, ERIN ([id number]) on 10/21/2016 12:06:22 PM       Radiology Dg Chest 2 View  Result Date: 10/21/2016 CLINICAL DATA:  Worsening weakness.  Atrial fibrillation. EXAM: CHEST  2 VIEW COMPARISON:  None. FINDINGS: The heart size and mediastinal contours are within normal limits. Aortic atherosclerosis. Both lungs are clear. No evidence of pleural effusion or pneumothorax. Mild thoracic spine degenerative changes noted. IMPRESSION: No active cardiopulmonary disease. Electronically Signed   By: Earle Gell M.D.   On: 10/21/2016 11:40    Procedures Procedures (including critical care time)  Medications Ordered in ED Medications - No data to display   Initial Impression / Assessment and Plan / ED Course  I have reviewed the triage vital signs and the nursing notes.  Pertinent labs & imaging results that were available during my care of the patient were reviewed by me and considered in my medical decision making (see chart for details).   Patient presents to the ED with chest discomfort and questionable new onset afib.  The ED EKG shows sinus rhythm with premature atrial complexes.  Patient is on metoprolol and losartan for blood pressure control.  Her heart rates are currently controlled.  Concerned about starting anticoagulation if this is not afib. Patient is also a high fall risk.  She has a healing ulcer on her left foot with previous great toe amputation and has fallen twice this week.   [facility].  Final Clinical Impressions(s) / ED Diagnoses   Final diagnoses:  None    New Prescriptions New Prescriptions   No medications on file     Valinda Party, DO 10/21/16 1600    Merrily Pew, MD 10/22/16 [phone]

## 2016-10-21 NOTE — ED Notes (Signed)
Pt daughter would like to be contacted for disposition. Darlyne Russian at [phone]

## 2016-10-21 NOTE — ED Notes (Signed)
Care handoff to Casey, RN 

## 2016-10-21 NOTE — Consult Note (Signed)
Reason for Consult: a fib   Referring Physician: DrMarland Kitchen [facility]- ER provider   PCP:  Tawanna Solo, MD  Primary Cardiologist: new  Sara Griffith is an 81 y.o. female.    Chief Complaint:  Sent from MD office with new onset of A fib.  HPI: 29 yof with no prior cardiac hx but + DM-2, HTN, TIA and CKD was seen at PCP and found to be in a fib.  She had presented with chest discomfort.  Pt was aware of heart palpitations.    She had been in her normal health until last week.  She had 2 falls due to weakness.  She has stayed in bed which is unusal for her.  She went to PCP today and EKG was noted a fib, but on my reading it is SR there is artifact but it is SR.  Pt does state that last pm she had chest pressure lt lateral and again this AM - both times lasted about 10 min and resolved with no associated symptoms.  Since she has ben in ER she has had one episode that lasted 5 min.  She does have HTN that her daughter tells me she has not had for years. Has been well contolled    On arrival here she had SR with PACs.  Troponin 0.00 BNP 295; Cr 1.18, CBC normal;  CXR with no acute process.   Currently stable except for BP.  Lives at [facility].  Past Medical History:  Diagnosis Date  . CKD (chronic kidney disease)   . Diabetes mellitus without complication ([place])   . GERD (gastroesophageal reflux disease)   . HLD (hyperlipidemia)   . Hypertension   . IDA (iron deficiency anemia)   . PVD (peripheral vascular disease) ([place])   . RLS (restless legs syndrome)   . TIA (transient ischemic attack)   . Uses walker   . Vitamin D deficiency     Past Surgical History:  Procedure Laterality Date  . ABDOMINAL HYSTERECTOMY     with bladder tac  . APPENDECTOMY    . CARPAL TUNNEL RELEASE Right   . CATARACT EXTRACTION, BILATERAL    . CERVICAL DISC SURGERY    . COLONOSCOPY    . DILATION AND CURETTAGE OF UTERUS     x 2  . left great toe amputation    . ROTATOR CUFF REPAIR  Left     Family History  Problem Relation Age of Onset  . Hypertension Mother   . Cancer Father   . Arrhythmia Sister     ppm  . Arrhythmia Brother     ppm   Social History:  reports that she has quit smoking. Her smoking use included Cigarettes. She quit after 30.00 years of use. She quit smokeless tobacco use about 62 years ago. Her smokeless tobacco use included Snuff. She reports that she does not drink alcohol or use drugs.  Allergies:  Allergies  Allergen Reactions  . Cymbalta [Duloxetine Hcl] Swelling  . Librax [Chlordiazepoxide-Clidinium] Swelling  . Lyrica [Pregabalin] Swelling  . Naproxen Swelling   OUTPATIENT medications: No current facility-administered medications on file prior to encounter.    Current Outpatient Prescriptions on File Prior to Encounter  Medication Sig Dispense Refill  . aspirin EC 81 MG tablet Take 81 mg by mouth daily.    . cholecalciferol (VITAMIN D) 1000 units tablet Take 1,000 Units by mouth daily.    . ferrous sulfate 325 (65 FE) MG  tablet Take 325 mg by mouth 2 (two) times daily.  0  . folic acid (FOLVITE) 1 MG tablet Take 0.5 tablets (0.5 mg total) by mouth daily. 30 tablet 1  . HYDROcodone-acetaminophen (NORCO/VICODIN) 5-325 MG tablet Take 1-2 tablets by mouth every 4 (four) hours as needed for moderate pain. 15 tablet 0  . losartan (COZAAR) 100 MG tablet Take 100 mg by mouth daily.  0  . metoprolol tartrate (LOPRESSOR) 50 MG tablet Take 1 tablet (50 mg total) by mouth 2 (two) times daily with a meal. 60 tablet 2  . omega-3 acid ethyl esters (LOVAZA) 1 g capsule Take 1,000 mg by mouth 3 (three) times daily.  0  . omeprazole (PRILOSEC) 40 MG capsule Take 40 mg by mouth daily.  0  . OVER THE COUNTER MEDICATION Place 1 drop into both eyes as needed (dry eyes). Over the counter dry eye drops.    . Ropinirole HCl 6 MG TB24 Take 6 mg by mouth daily.  1  . simvastatin (ZOCOR) 40 MG tablet Take 40 mg by mouth every evening.  0  . TRADJENTA 5 MG  TABS tablet Take 5 mg by mouth daily.  0  . traMADol (ULTRAM) 50 MG tablet Take 50 mg by mouth 3 (three) times daily.  0  . vitamin E 400 UNIT capsule Take 400 Units by mouth daily.        Results for orders placed or performed during the hospital encounter of 10/21/16 (from the past 48 hour(s))  Basic metabolic panel     Status: Abnormal   Collection Time: 10/21/16 11:15 AM  Result Value Ref Range   Sodium 137 135 - 145 mmol/L   Potassium 4.3 3.5 - 5.1 mmol/L   Chloride 102 101 - 111 mmol/L   CO2 27 22 - 32 mmol/L   Glucose, Bld 135 (H) 65 - 99 mg/dL   BUN 22 (H) 6 - 20 mg/dL   Creatinine, Ser 3.66 (H) 0.44 - 1.00 mg/dL   Calcium 9.6 8.9 - 29.4 mg/dL   GFR calc non Af Amer 43 (L) >60 mL/min   GFR calc Af Amer 50 (L) >60 mL/min    Comment: (NOTE) The eGFR has been calculated using the CKD EPI equation. This calculation has not been validated in all clinical situations. eGFR's persistently <60 mL/min signify possible Chronic Kidney Disease.    Anion gap 8 5 - 15  CBC     Status: None   Collection Time: 10/21/16 11:15 AM  Result Value Ref Range   WBC 10.1 4.0 - 10.5 K/uL   RBC 4.58 3.87 - 5.11 MIL/uL   Hemoglobin 13.2 12.0 - 15.0 g/dL   HCT 76.5 46.5 - 03.5 %   MCV 89.1 78.0 - 100.0 fL   MCH 28.8 26.0 - 34.0 pg   MCHC 32.4 30.0 - 36.0 g/dL   RDW 46.5 68.1 - 27.5 %   Platelets 245 150 - 400 K/uL  Protime-INR- (order if Patient is taking Coumadin / Warfarin)     Status: None   Collection Time: 10/21/16 11:15 AM  Result Value Ref Range   Prothrombin Time 12.8 11.4 - 15.2 seconds   INR 0.96   I-Stat Troponin, ED (not at [facility])     Status: None   Collection Time: 10/21/16 11:40 AM  Result Value Ref Range   Troponin i, poc 0.00 0.00 - 0.08 ng/mL   Comment 3            Comment: Due  to the release kinetics of cTnI, a negative result within the first hours of the onset of symptoms does not rule out myocardial infarction with certainty. If myocardial infarction is still  suspected, repeat the test at appropriate intervals.   Brain natriuretic peptide     Status: Abnormal   Collection Time: 10/21/16  2:37 PM  Result Value Ref Range   B Natriuretic Peptide 295.2 (H) 0.0 - 100.0 pg/mL  Urinalysis, Routine w reflex microscopic     Status: Abnormal   Collection Time: 10/21/16  3:11 PM  Result Value Ref Range   Color, Urine STRAW (A) YELLOW   APPearance CLEAR CLEAR   Specific Gravity, Urine 1.009 1.005 - 1.030   pH 6.0 5.0 - 8.0   Glucose, UA NEGATIVE NEGATIVE mg/dL   Hgb urine dipstick NEGATIVE NEGATIVE   Bilirubin Urine NEGATIVE NEGATIVE   Ketones, ur NEGATIVE NEGATIVE mg/dL   Protein, ur 30 (A) NEGATIVE mg/dL   Nitrite NEGATIVE NEGATIVE   Leukocytes, UA NEGATIVE NEGATIVE   RBC / HPF 0-5 0 - 5 RBC/hpf   WBC, UA 0-5 0 - 5 WBC/hpf   Bacteria, UA NONE SEEN NONE SEEN   Squamous Epithelial / LPF 0-5 (A) NONE SEEN   Dg Chest 2 View  Result Date: 10/21/2016 CLINICAL DATA:  Worsening weakness.  Atrial fibrillation. EXAM: CHEST  2 VIEW COMPARISON:  None. FINDINGS: The heart size and mediastinal contours are within normal limits. Aortic atherosclerosis. Both lungs are clear. No evidence of pleural effusion or pneumothorax. Mild thoracic spine degenerative changes noted. IMPRESSION: No active cardiopulmonary disease. Electronically Signed   By: Earle Gell M.D.   On: 10/21/2016 11:40    ROS: General:no colds or fevers, no weight changes, + weakness Skin:no rashes or ulcers, + bruise on Lt arm from fall, ulcer on Lt foot HEENT:no blurred vision, no congestion CV:see HPI PUL:see HPI GI:no diarrhea constipation or melena, no indigestion GU:no hematuria, no dysuria MS:no joint pain, no claudication, lt Gt toe amputation in [place] Neuro:no syncope, no lightheadedness Endo:+ diabetes, no thyroid disease   Blood pressure (!) 186/107, pulse 76, temperature 98 F (36.7 C), temperature source Oral, resp. rate 18, height '4\' 10"'$  (1.473 m), weight 134 lb 9.6 oz  (61.1 kg), SpO2 98 %.  Wt Readings from Last 3 Encounters:  10/21/16 134 lb 9.6 oz (61.1 kg)  07/02/16 135 lb (61.2 kg)    PE: General:Pleasant affect, NAD Skin:Warm and dry, brisk capillary refill, ulcer on Lt foot and bruise on Lt arm, Lt gt toe amputation HEENT:normocephalic, sclera clear, mucus membranes moist Neck:supple, no JVD, no bruits  Heart:S1S2 RRR without murmur, gallup, rub or click Lungs:clear without rales, rhonchi, or wheezes XNT:ZGYF, non tender, + BS, do not palpate liver spleen or masses Ext:no lower ext edema, 1+ pedal pulses, 2+ radial pulses Neuro:alert and oriented X 3, MAE, follows commands, + facial symmetry    Assessment/Plan Chest pain initial troponin neg but with her HTN would keep overnight to control BP and serial troponin.  Would hold heparin unless troponin elevated.  No prior cardiac hx  MD to see.  A fib - on EKG that is read as a fib it is SR, she is having freq PACs - with her elevated BP I have given her her pm dose of lopressor.  Monitor overnight for PAF.  Has hx of TIA but daughter stated it was due to HTN.  HTN poorly controlled giving lopressor 50 now this is time she takes at home.  DM-2 continue home meds  Lt foot wound.  Needs wound nurse consult pt has it followed by podiatrist, with hx of toe amputation and PVD.       Cecilie Kicks  Nurse Practitioner Certified [place] Pager [phone] or after 5pm or weekends call [phone] 10/21/2016, 5:18 PM  Changed to H&P - see tht note.   Sherren Mocha 10/21/2016 7:05 PM

## 2016-10-21 NOTE — H&P (Addendum)
Electrophysiology Editor: Isaiah Serge, NP (Nurse Practitioner) Status: Cosign Needed  _0 Hide copied text _1 Hover for attribution information         Reason for Consult: a fib   Referring Physician: DrMarland Kitchen [facility]- ER provider   PCP:  Tawanna Solo, MD  Primary Cardiologist: new  Sara Griffith is an 81 y.o. female.    Chief Complaint:  Sent from MD office with new onset of A fib.  HPI: 49 yof with no prior cardiac hx but + DM-2, HTN, TIA and CKD was seen at PCP and found to be in a fib.  She had presented with chest discomfort.  Pt was aware of heart palpitations.    She had been in her normal health until last week.  She had 2 falls due to weakness.  She has stayed in bed which is unusal for her.  She went to PCP today and EKG was noted a fib, but on my reading it is SR there is artifact but it is SR.  Pt does state that last pm she had chest pressure lt lateral and again this AM - both times lasted about 10 min and resolved with no associated symptoms.  Since she has ben in ER she has had one episode that lasted 5 min.  She does have HTN that her daughter tells me she has not had for years. Has been well contolled    On arrival here she had SR with PACs.  Troponin 0.00 BNP 295; Cr 1.18, CBC normal;  CXR with no acute process.   Currently stable except for BP.  Lives at [facility].      Past Medical History:  Diagnosis Date  . CKD (chronic kidney disease)   . Diabetes mellitus without complication ([place])   . GERD (gastroesophageal reflux disease)   . HLD (hyperlipidemia)   . Hypertension   . IDA (iron deficiency anemia)   . PVD (peripheral vascular disease) ([place])   . RLS (restless legs syndrome)   . TIA (transient ischemic attack)   . Uses walker   . Vitamin D deficiency          Past Surgical History:  Procedure Laterality Date  . ABDOMINAL HYSTERECTOMY     with bladder tac  . APPENDECTOMY    . CARPAL TUNNEL RELEASE  Right   . CATARACT EXTRACTION, BILATERAL    . CERVICAL DISC SURGERY    . COLONOSCOPY    . DILATION AND CURETTAGE OF UTERUS     x 2  . left great toe amputation    . ROTATOR CUFF REPAIR Left           Family History  Problem Relation Age of Onset  . Hypertension Mother   . Cancer Father   . Arrhythmia Sister     ppm  . Arrhythmia Brother     ppm   Social History:  reports that she has quit smoking. Her smoking use included Cigarettes. She quit after 30.00 years of use. She quit smokeless tobacco use about 62 years ago. Her smokeless tobacco use included Snuff. She reports that she does not drink alcohol or use drugs.  Allergies:      Allergies  Allergen Reactions  . Cymbalta [Duloxetine Hcl] Swelling  . Librax [Chlordiazepoxide-Clidinium] Swelling  . Lyrica [Pregabalin] Swelling  . Naproxen Swelling   OUTPATIENT medications: No current facility-administered medications on file prior to encounter.          Current Outpatient Prescriptions on File Prior to  Encounter  Medication Sig Dispense Refill  . aspirin EC 81 MG tablet Take 81 mg by mouth daily.    . cholecalciferol (VITAMIN D) 1000 units tablet Take 1,000 Units by mouth daily.    . ferrous sulfate 325 (65 FE) MG tablet Take 325 mg by mouth 2 (two) times daily.  0  . folic acid (FOLVITE) 1 MG tablet Take 0.5 tablets (0.5 mg total) by mouth daily. 30 tablet 1  . HYDROcodone-acetaminophen (NORCO/VICODIN) 5-325 MG tablet Take 1-2 tablets by mouth every 4 (four) hours as needed for moderate pain. 15 tablet 0  . losartan (COZAAR) 100 MG tablet Take 100 mg by mouth daily.  0  . metoprolol tartrate (LOPRESSOR) 50 MG tablet Take 1 tablet (50 mg total) by mouth 2 (two) times daily with a meal. 60 tablet 2  . omega-3 acid ethyl esters (LOVAZA) 1 g capsule Take 1,000 mg by mouth 3 (three) times daily.  0  . omeprazole (PRILOSEC) 40 MG capsule Take 40 mg by mouth daily.  0  . OVER THE COUNTER  MEDICATION Place 1 drop into both eyes as needed (dry eyes). Over the counter dry eye drops.    . Ropinirole HCl 6 MG TB24 Take 6 mg by mouth daily.  1  . simvastatin (ZOCOR) 40 MG tablet Take 40 mg by mouth every evening.  0  . TRADJENTA 5 MG TABS tablet Take 5 mg by mouth daily.  0  . traMADol (ULTRAM) 50 MG tablet Take 50 mg by mouth 3 (three) times daily.  0  . vitamin E 400 UNIT capsule Take 400 Units by mouth daily.              Results for orders placed or performed during the hospital encounter of 10/21/16 (from the past 48 hour(s))  Basic metabolic panel     Status: Abnormal   Collection Time: 10/21/16 11:15 AM  Result Value Ref Range   Sodium 137 135 - 145 mmol/L   Potassium 4.3 3.5 - 5.1 mmol/L   Chloride 102 101 - 111 mmol/L   CO2 27 22 - 32 mmol/L   Glucose, Bld 135 (H) 65 - 99 mg/dL   BUN 22 (H) 6 - 20 mg/dL   Creatinine, Ser 1.18 (H) 0.44 - 1.00 mg/dL   Calcium 9.6 8.9 - 10.3 mg/dL   GFR calc non Af Amer 43 (L) >60 mL/min   GFR calc Af Amer 50 (L) >60 mL/min    Comment: (NOTE) The eGFR has been calculated using the CKD EPI equation. This calculation has not been validated in all clinical situations. eGFR's persistently <60 mL/min signify possible Chronic Kidney Disease.    Anion gap 8 5 - 15  CBC     Status: None   Collection Time: 10/21/16 11:15 AM  Result Value Ref Range   WBC 10.1 4.0 - 10.5 K/uL   RBC 4.58 3.87 - 5.11 MIL/uL   Hemoglobin 13.2 12.0 - 15.0 g/dL   HCT 40.8 36.0 - 46.0 %   MCV 89.1 78.0 - 100.0 fL   MCH 28.8 26.0 - 34.0 pg   MCHC 32.4 30.0 - 36.0 g/dL   RDW 13.5 11.5 - 15.5 %   Platelets 245 150 - 400 K/uL  Protime-INR- (order if Patient is taking Coumadin / Warfarin)     Status: None   Collection Time: 10/21/16 11:15 AM  Result Value Ref Range   Prothrombin Time 12.8 11.4 - 15.2 seconds   INR 0.96  I-Stat Troponin, ED (not at [facility])     Status: None   Collection Time: 10/21/16 11:40 AM  Result  Value Ref Range   Troponin i, poc 0.00 0.00 - 0.08 ng/mL   Comment 3            Comment: Due to the release kinetics of cTnI, a negative result within the first hours of the onset of symptoms does not rule out myocardial infarction with certainty. If myocardial infarction is still suspected, repeat the test at appropriate intervals.   Brain natriuretic peptide     Status: Abnormal   Collection Time: 10/21/16  2:37 PM  Result Value Ref Range   B Natriuretic Peptide 295.2 (H) 0.0 - 100.0 pg/mL  Urinalysis, Routine w reflex microscopic     Status: Abnormal   Collection Time: 10/21/16  3:11 PM  Result Value Ref Range   Color, Urine STRAW (A) YELLOW   APPearance CLEAR CLEAR   Specific Gravity, Urine 1.009 1.005 - 1.030   pH 6.0 5.0 - 8.0   Glucose, UA NEGATIVE NEGATIVE mg/dL   Hgb urine dipstick NEGATIVE NEGATIVE   Bilirubin Urine NEGATIVE NEGATIVE   Ketones, ur NEGATIVE NEGATIVE mg/dL   Protein, ur 30 (A) NEGATIVE mg/dL   Nitrite NEGATIVE NEGATIVE   Leukocytes, UA NEGATIVE NEGATIVE   RBC / HPF 0-5 0 - 5 RBC/hpf   WBC, UA 0-5 0 - 5 WBC/hpf   Bacteria, UA NONE SEEN NONE SEEN   Squamous Epithelial / LPF 0-5 (A) NONE SEEN   Dg Chest 2 View  Result Date: 10/21/2016 CLINICAL DATA:  Worsening weakness.  Atrial fibrillation. EXAM: CHEST  2 VIEW COMPARISON:  None. FINDINGS: The heart size and mediastinal contours are within normal limits. Aortic atherosclerosis. Both lungs are clear. No evidence of pleural effusion or pneumothorax. Mild thoracic spine degenerative changes noted. IMPRESSION: No active cardiopulmonary disease. Electronically Signed   By: Earle Gell M.D.   On: 10/21/2016 11:40    ROS: General:no colds or fevers, no weight changes, + weakness Skin:no rashes or ulcers, + bruise on Lt arm from fall, ulcer on Lt foot HEENT:no blurred vision, no congestion CV:see HPI PUL:see HPI GI:no diarrhea constipation or melena, no indigestion GU:no hematuria,  no dysuria MS:no joint pain, no claudication, lt Gt toe amputation in [place] Neuro:no syncope, no lightheadedness Endo:+ diabetes, no thyroid disease   Blood pressure (!) 186/107, pulse 76, temperature 98 F (36.7 C), temperature source Oral, resp. rate 18, height _0  (1.473 m), weight 134 lb 9.6 oz (61.1 kg), SpO2 98 %.     Wt Readings from Last 3 Encounters:  10/21/16 134 lb 9.6 oz (61.1 kg)  07/02/16 135 lb (61.2 kg)    PE: General:Pleasant affect, NAD Skin:Warm and dry, brisk capillary refill, ulcer on Lt foot and bruise on Lt arm, Lt gt toe amputation HEENT:normocephalic, sclera clear, mucus membranes moist Neck:supple, no JVD, no bruits  Heart:S1S2 RRR without murmur, gallup, rub or click Lungs:clear without rales, rhonchi, or wheezes ENI:DPOE, non tender, + BS, do not palpate liver spleen or masses Ext:no lower ext edema, 1+ pedal pulses, 2+ radial pulses Neuro:alert and oriented X 3, MAE, follows commands, + facial symmetry    Assessment/Plan Chest pain initial troponin neg but with her HTN would keep overnight to control BP and serial troponin.  Would hold heparin unless troponin elevated.  No prior cardiac hx  MD to see.  A fib - on EKG that is read as a fib it is  SR, she is having freq PACs - with her elevated BP I have given her her pm dose of lopressor.  Monitor overnight for PAF.  Has hx of TIA but daughter stated it was due to HTN.  HTN poorly controlled giving lopressor 50 now this is time she takes at home.  DM-2 continue home meds  Lt foot wound.  Needs wound nurse consult pt has it followed by podiatrist, with hx of toe amputation and PVD.    Cecilie Kicks  Nurse Practitioner Certified [place] Pager [phone] or after 5pm or weekends call [phone] 10/21/2016, 5:18 PM          Patient seen, examined. Available data reviewed. Agree with findings, assessment, and plan as outlined by Cecilie Kicks, NP. The  patient is independently interviewed and examined. She is a delightful elderly woman in no distress. JVP is normal. There are no carotid bruits. Lungs are clear to auscultation bilaterally. Heart is regular rate and rhythm with no murmur or gallop. Abdomen is soft and nontender. There is no pretibial edema.  All available data is reviewed. I suspect the patient has had chest pressure related to hypertensive emergency. We will continue her on metoprolol and losartan. Will add amlodipine for better blood pressure control. As long as her blood pressure can be controlled overnight, I would anticipate moving forward with a Lexiscan Myoview stress test in the morning to evaluate for ischemic heart disease. Will cycle enzymes overnight.  I have reviewed the patient's EKG and there is no evidence of atrial fibrillation. She has had sinus rhythm. There is artifact present that gives an appearance of atrial fibrillation based on changes on her baseline. However, she clearly has P waves in sinus rhythm with PACs. There are no significant ST segment changes. Will continue home medications for diabetes control and will consult the wound care nurse to evaluate her left foot wound.  With a mildly elevated BNP, she should have an echo - likely will do as an outpatient as there are no clinical signs of CHF.  If the patient's stress test is low risk and blood pressure is controlled, it would be reasonable to discharge her home tomorrow afternoon.  Sherren Mocha, M.D. 10/21/2016 6:59 PM

## 2016-10-21 NOTE — ED Notes (Signed)
Attempted report x1. 

## 2016-10-21 NOTE — ED Notes (Signed)
Pt is hungry and wanted to know if they can eat

## 2016-10-21 NOTE — ED Notes (Signed)
ED Provider at bedside. 

## 2016-10-22 ENCOUNTER — Observation Stay (HOSPITAL_BASED_OUTPATIENT_CLINIC_OR_DEPARTMENT_OTHER): Payer: Medicare HMO

## 2016-10-22 DIAGNOSIS — E1151 Type 2 diabetes mellitus with diabetic peripheral angiopathy without gangrene: Secondary | ICD-10-CM | POA: Diagnosis not present

## 2016-10-22 DIAGNOSIS — I160 Hypertensive urgency: Secondary | ICD-10-CM | POA: Diagnosis not present

## 2016-10-22 DIAGNOSIS — G2581 Restless legs syndrome: Secondary | ICD-10-CM | POA: Diagnosis not present

## 2016-10-22 DIAGNOSIS — E785 Hyperlipidemia, unspecified: Secondary | ICD-10-CM | POA: Diagnosis not present

## 2016-10-22 DIAGNOSIS — R079 Chest pain, unspecified: Secondary | ICD-10-CM

## 2016-10-22 DIAGNOSIS — E1122 Type 2 diabetes mellitus with diabetic chronic kidney disease: Secondary | ICD-10-CM | POA: Diagnosis not present

## 2016-10-22 DIAGNOSIS — N189 Chronic kidney disease, unspecified: Secondary | ICD-10-CM | POA: Diagnosis not present

## 2016-10-22 DIAGNOSIS — I161 Hypertensive emergency: Secondary | ICD-10-CM | POA: Diagnosis not present

## 2016-10-22 DIAGNOSIS — K219 Gastro-esophageal reflux disease without esophagitis: Secondary | ICD-10-CM | POA: Diagnosis not present

## 2016-10-22 DIAGNOSIS — I129 Hypertensive chronic kidney disease with stage 1 through stage 4 chronic kidney disease, or unspecified chronic kidney disease: Secondary | ICD-10-CM | POA: Diagnosis not present

## 2016-10-22 DIAGNOSIS — D509 Iron deficiency anemia, unspecified: Secondary | ICD-10-CM | POA: Diagnosis not present

## 2016-10-22 LAB — GLUCOSE, CAPILLARY
GLUCOSE-CAPILLARY: 131 mg/dL — AB (ref 65–99)
Glucose-Capillary: 216 mg/dL — ABNORMAL HIGH (ref 65–99)

## 2016-10-22 LAB — TROPONIN I
Troponin I: <0.03 ng/mL (ref <0.03)
Troponin I: <0.03 ng/mL (ref <0.03)

## 2016-10-22 LAB — CBC
HCT: 38.3 % (ref 36.0–46.0)
Hemoglobin: 12.5 g/dL (ref 12.0–15.0)
MCH: 28.7 pg (ref 26.0–34.0)
MCHC: 32.6 g/dL (ref 30.0–36.0)
MCV: 87.8 fL (ref 78.0–100.0)
Platelets: 232 10*3/uL (ref 150–400)
RBC: 4.36 MIL/uL (ref 3.87–5.11)
RDW: 13 % (ref 11.5–15.5)
WBC: 8.6 10*3/uL (ref 4.0–10.5)

## 2016-10-22 LAB — LIPID PANEL
Cholesterol: 145 mg/dL (ref 0–200)
HDL: 40 mg/dL — AB (ref >40)
LDL CALC: 75 mg/dL (ref 0–99)
Total CHOL/HDL Ratio: 3.6 RATIO
Triglycerides: 151 mg/dL — ABNORMAL HIGH (ref <150)
VLDL: 30 mg/dL (ref 0–40)

## 2016-10-22 LAB — NM MYOCAR MULTI W/SPECT W/WALL MOTION / EF
CSEPED: 0 min
CSEPEW: 1 METS
CSEPHR: 63 %
Exercise duration (sec): 0 s
MPHR: 142 {beats}/min
Peak HR: 90 {beats}/min
RPE: 0
Rest HR: 61 {beats}/min

## 2016-10-22 LAB — BASIC METABOLIC PANEL
Anion gap: 10 (ref 5–15)
BUN: 19 mg/dL (ref 6–20)
CHLORIDE: 102 mmol/L (ref 101–111)
CO2: 29 mmol/L (ref 22–32)
CREATININE: 1.2 mg/dL — AB (ref 0.44–1.00)
Calcium: 9.2 mg/dL (ref 8.9–10.3)
GFR calc Af Amer: 49 mL/min — ABNORMAL LOW (ref >60)
GFR calc non Af Amer: 42 mL/min — ABNORMAL LOW (ref >60)
GLUCOSE: 127 mg/dL — AB (ref 65–99)
Potassium: 4 mmol/L (ref 3.5–5.1)
SODIUM: 141 mmol/L (ref 135–145)

## 2016-10-22 MED ORDER — REGADENOSON 0.4 MG/5ML IV SOLN
0.4000 mg | Freq: Once | INTRAVENOUS | Status: AC
  Administered 2016-10-22: 0.4 mg via INTRAVENOUS

## 2016-10-22 MED ORDER — AMLODIPINE BESYLATE 5 MG PO TABS: 5.0000 mg | ORAL_TABLET | Freq: Every day | ORAL | 5 refills | Status: DC

## 2016-10-22 MED ORDER — TECHNETIUM TC 99M TETROFOSMIN IV KIT
30.0000 | PACK | Freq: Once | INTRAVENOUS | Status: AC | PRN
  Administered 2016-10-22: 30 via INTRAVENOUS

## 2016-10-22 MED ORDER — TECHNETIUM TC 99M TETROFOSMIN IV KIT
10.0000 | PACK | Freq: Once | INTRAVENOUS | Status: AC | PRN
  Administered 2016-10-22: 10 via INTRAVENOUS

## 2016-10-22 MED ORDER — REGADENOSON 0.4 MG/5ML IV SOLN
INTRAVENOUS | Status: AC
  Filled 2016-10-22: qty 5
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
## 2016-10-22 NOTE — Discharge Summary (Signed)
Discharge Summary    Patient ID: Sara Griffith,  MRN: [id number], DOB/AGE: 79/27/1937 79 y.o.  Admit date: 10/21/2016 Discharge date: 10/22/2016  Primary Care Provider: Tawanna Solo Primary Cardiologist: Dr. Burt Knack   Discharge Diagnoses    Active Problems:   Chest pressure   Chest pain at rest   Hypertensive emergency   Allergies Allergies  Allergen Reactions  . Cymbalta [Duloxetine Hcl] Swelling  . Librax [Chlordiazepoxide-Clidinium] Swelling  . Lyrica [Pregabalin] Swelling  . Naproxen Swelling    Diagnostic Studies/Procedures    NST 10/22/16   FINDINGS: Perfusion: No decreased activity in the left ventricle on stress imaging to suggest reversible ischemia or infarction.  Wall Motion: Normal left ventricular wall motion. No left ventricular dilation.  Left Ventricular Ejection Fraction: 83 %  End diastolic volume 45 ml  End systolic volume 8 ml  IMPRESSION: 1. No reversible ischemia or infarction.  2. Normal left ventricular wall motion.  3. Left ventricular ejection fraction 83%  4. Non invasive risk stratification*: Low  History of Present Illness     81 y/o female with no prior cardiac hx but + DM-2, HTN, TIA and CKD who was admitted for chest pain and HTN urgency. There was ? regarding new onset atrial fibrillation, however EKGs were reviewed and it was determined that her underlying rhythm was actually SR with PACs. No afib was observed during her hospitalization.   Hospital Course     Pt was admitted to telemetry. Her medical regimen was adjusted. Amlodipine was added to her metoprolol and losartan and her BP improved. Her CP resolved with improvement in BP. Cardiac enzymes were cycled and negative x 3. She underwent a NST that was low risk for ischemia. It was felt that her CP was 2/2 to HTN urgency. She was last seen by Dr. Burt Knack who determined she was stable for discharge home. She will f/u with an APP in 1-2 weeks.   Consultants:  none  Discharge Vitals Blood pressure (!) 163/69, pulse 77, temperature 97.6 F (36.4 C), temperature source Oral, resp. rate 16, height 5' (1.524 m), weight 132 lb (59.9 kg), SpO2 99 %.  Filed Weights   10/21/16 1113 10/21/16 1845 10/22/16 0323  Weight: 134 lb 9.6 oz (61.1 kg) 131 lb 4.8 oz (59.6 kg) 132 lb (59.9 kg)    Labs & Radiologic Studies    CBC  Recent Labs  10/21/16 1115 10/22/16 0600  WBC 10.1 8.6  HGB 13.2 12.5  HCT 40.8 38.3  MCV 89.1 87.8  PLT 245 [id number]   Basic Metabolic Panel  Recent Labs  10/21/16 1115 10/21/16 1903 10/22/16 0600  NA 137  --  141  K 4.3  --  4.0  CL 102  --  102  CO2 27  --  29  GLUCOSE 135*  --  127*  BUN 22*  --  19  CREATININE 1.18*  --  1.20*  CALCIUM 9.6  --  9.2  MG  --  1.6*  --    Liver Function Tests  Recent Labs  10/21/16 1903  AST 20  ALT 15  ALKPHOS 66  BILITOT 0.5  PROT 6.7  ALBUMIN 2.9*   No results for input(s): LIPASE, AMYLASE in the last 72 hours. Cardiac Enzymes  Recent Labs  10/21/16 1903 10/22/16 0011 10/22/16 0600  TROPONINI <0.03 <0.03 <0.03   BNP Invalid input(s): POCBNP D-Dimer No results for input(s): DDIMER in the last 72 hours. Hemoglobin A1C No results for input(s): HGBA1C in  the last 72 hours. Fasting Lipid Panel  Recent Labs  10/22/16 0600  CHOL 145  HDL 40*  LDLCALC 75  TRIG 151*  CHOLHDL 3.6   Thyroid Function Tests  Recent Labs  10/21/16 1903  TSH 6.254*   _____________  Dg Chest 2 View  Result Date: 10/21/2016 CLINICAL DATA:  Worsening weakness.  Atrial fibrillation. EXAM: CHEST  2 VIEW COMPARISON:  None. FINDINGS: The heart size and mediastinal contours are within normal limits. Aortic atherosclerosis. Both lungs are clear. No evidence of pleural effusion or pneumothorax. Mild thoracic spine degenerative changes noted. IMPRESSION: No active cardiopulmonary disease. Electronically Signed   By: Earle Gell M.D.   On: 10/21/2016 11:40   Nm Myocar Multi W/spect  W/wall Motion / Ef  Result Date: 10/22/2016 CLINICAL DATA:  Chest pain, weakness EXAM: MYOCARDIAL IMAGING WITH SPECT (REST AND PHARMACOLOGIC-STRESS) GATED LEFT VENTRICULAR WALL MOTION STUDY LEFT VENTRICULAR EJECTION FRACTION TECHNIQUE: Standard myocardial SPECT imaging was performed after resting intravenous injection of 10 mCi Tc-7m tetrofosmin. Subsequently, intravenous infusion of Lexiscan was performed under the supervision of the Cardiology staff. At peak effect of the drug, 30 mCi Tc-45m tetrofosmin was injected intravenously and standard myocardial SPECT imaging was performed. Quantitative gated imaging was also performed to evaluate left ventricular wall motion, and estimate left ventricular ejection fraction. COMPARISON:  None. FINDINGS: Perfusion: No decreased activity in the left ventricle on stress imaging to suggest reversible ischemia or infarction. Wall Motion: Normal left ventricular wall motion. No left ventricular dilation. Left Ventricular Ejection Fraction: 83 % End diastolic volume 45 ml End systolic volume 8 ml IMPRESSION: 1. No reversible ischemia or infarction. 2. Normal left ventricular wall motion. 3. Left ventricular ejection fraction 83% 4. Non invasive risk stratification*: Low *2012 Appropriate Use Criteria for Coronary Revascularization Focused Update: J Am Coll Cardiol. N6492421. http://content.airportbarriers.com.aspx?articleid=1201161 Electronically Signed   By: Lucrezia Europe M.D.   On: 10/22/2016 17:12   Disposition   Pt is being discharged home today in good condition.  Follow-up Plans & Appointments     Discharge Instructions    Diet - low sodium heart healthy    Complete by:  As directed    Increase activity slowly    Complete by:  As directed       Discharge Medications   Current Discharge Medication List    START taking these medications   Details  amLODipine (NORVASC) 5 MG tablet Take 1 tablet (5 mg total) by mouth daily. Qty: 30 tablet,  Refills: 5      CONTINUE these medications which have NOT CHANGED   Details  aspirin EC 81 MG tablet Take 81 mg by mouth daily.    cholecalciferol (VITAMIN D) 1000 units tablet Take 1,000 Units by mouth daily.    ferrous sulfate 325 (65 FE) MG tablet Take 325 mg by mouth 2 (two) times daily. Refills: 0    folic acid (FOLVITE) 1 MG tablet Take 0.5 tablets (0.5 mg total) by mouth daily. Qty: 30 tablet, Refills: 1    losartan (COZAAR) 100 MG tablet Take 100 mg by mouth daily. Refills: 0    metoprolol tartrate (LOPRESSOR) 50 MG tablet Take 1 tablet (50 mg total) by mouth 2 (two) times daily with a meal. Qty: 60 tablet, Refills: 2    omega-3 acid ethyl esters (LOVAZA) 1 g capsule Take 1 g by mouth 3 (three) times daily.  Refills: 0    omeprazole (PRILOSEC) 40 MG capsule Take 40 mg by mouth daily. Refills: 0  Ropinirole HCl 6 MG TB24 Take 6 mg by mouth at bedtime.  Refills: 1    simvastatin (ZOCOR) 40 MG tablet Take 40 mg by mouth at bedtime.  Refills: 0    TRADJENTA 5 MG TABS tablet Take 5 mg by mouth daily. Refills: 0    traMADol (ULTRAM) 50 MG tablet Take 50 mg by mouth 3 (three) times daily. Refills: 0    vitamin E 400 UNIT capsule Take 400 Units by mouth daily.    HYDROcodone-acetaminophen (NORCO/VICODIN) 5-325 MG tablet Take 1-2 tablets by mouth every 4 (four) hours as needed for moderate pain. Qty: 15 tablet, Refills: 0    OVER THE COUNTER MEDICATION Place 1 drop into both eyes as needed (dry eyes). Over the counter dry eye drops.           Outstanding Labs/Studies   Outpatient 2D echo   Duration of Discharge Encounter   Greater than 30 minutes including physician time.  Signed, Lyda Jester PA-C 10/22/2016, 5:42 PM

## 2016-10-22 NOTE — Progress Notes (Signed)
Progress Note  Patient Name: Sara Griffith Date of Encounter: 10/22/2016  Primary Cardiologist: new Dr. Burt Knack   Subjective   No complaints except weakness  Inpatient Medications    Scheduled Meds: . amLODipine  5 mg Oral Daily  . aspirin  324 mg Oral NOW   Or  . aspirin  300 mg Rectal NOW  . aspirin EC  81 mg Oral Daily  . atorvastatin  20 mg Oral q1800  . cholecalciferol  1,000 Units Oral Daily  . ferrous sulfate  325 mg Oral BID  . folic acid  0.5 mg Oral Daily  . heparin  5,000 Units Subcutaneous Q8H  . insulin aspart  0-9 Units Subcutaneous TID WC  . linagliptin  5 mg Oral Daily  . losartan  100 mg Oral Daily  . metoprolol  50 mg Oral BID WC  . omega-3 acid ethyl esters  1,000 mg Oral TID  . pantoprazole  40 mg Oral Daily  . regadenoson      . Ropinirole HCl  6 mg Oral Daily  . sodium chloride flush  3 mL Intravenous Q12H  . traMADol  50 mg Oral TID   Continuous Infusions:  PRN Meds: sodium chloride, acetaminophen, HYDROcodone-acetaminophen, nitroGLYCERIN, ondansetron (ZOFRAN) IV, sodium chloride flush   Vital Signs    Vitals:   10/22/16 1316 10/22/16 1319 10/22/16 1321 10/22/16 1323  BP: (!) 149/71 (!) 161/76 (!) 163/75 (!) 157/77  Pulse: 83 86 85 84  Resp:      Temp:      TempSrc:      SpO2:      Weight:      Height:        Intake/Output Summary (Last 24 hours) at 10/22/16 1324 Last data filed at 10/22/16 1300  Gross per 24 hour  Intake                0 ml  Output             1000 ml  Net            -1000 ml   Filed Weights   10/21/16 1113 10/21/16 1845 10/22/16 0323  Weight: 134 lb 9.6 oz (61.1 kg) 131 lb 4.8 oz (59.6 kg) 132 lb (59.9 kg)    Telemetry    SR - Personally Reviewed  ECG    SR with PACs - Personally Reviewed  Physical Exam   GEN: No acute distress.   Neck: No JVD Cardiac: RRR, no murmurs, rubs, or gallops.  Respiratory: Clear to auscultation bilaterally. GI: Soft, nontender, non-distended  MS: No edema; No  deformity. Neuro:  Nonfocal  Psych: Normal affect   Labs    Chemistry Recent Labs Lab 10/21/16 1115 10/21/16 1903 10/22/16 0600  NA 137  --  141  K 4.3  --  4.0  CL 102  --  102  CO2 27  --  29  GLUCOSE 135*  --  127*  BUN 22*  --  19  CREATININE 1.18*  --  1.20*  CALCIUM 9.6  --  9.2  PROT  --  6.7  --   ALBUMIN  --  2.9*  --   AST  --  20  --   ALT  --  15  --   ALKPHOS  --  66  --   BILITOT  --  0.5  --   GFRNONAA 43*  --  42*  GFRAA 50*  --  49*  ANIONGAP 8  --  10     Hematology Recent Labs Lab 10/21/16 1115 10/22/16 0600  WBC 10.1 8.6  RBC 4.58 4.36  HGB 13.2 12.5  HCT 40.8 38.3  MCV 89.1 87.8  MCH 28.8 28.7  MCHC 32.4 32.6  RDW 13.5 13.0  PLT 245 232    Cardiac Enzymes Recent Labs Lab 10/21/16 1903 10/22/16 0011 10/22/16 0600  TROPONINI <0.03 <0.03 <0.03    Recent Labs Lab 10/21/16 1140  TROPIPOC 0.00     BNP Recent Labs Lab 10/21/16 1437  BNP 295.2*     DDimer No results for input(s): DDIMER in the last 168 hours.   Radiology    Dg Chest 2 View  Result Date: 10/21/2016 CLINICAL DATA:  Worsening weakness.  Atrial fibrillation. EXAM: CHEST  2 VIEW COMPARISON:  None. FINDINGS: The heart size and mediastinal contours are within normal limits. Aortic atherosclerosis. Both lungs are clear. No evidence of pleural effusion or pneumothorax. Mild thoracic spine degenerative changes noted. IMPRESSION: No active cardiopulmonary disease. Electronically Signed   By: Earle Gell M.D.   On: 10/21/2016 11:40    Cardiac Studies   nuc pending  Patient Profile     81 y.o. female with HTN and no cardiac hx with PACs, no a fib and uncontrolled HTN and significant weakness admitted.  No chest pain.   Assessment & Plan    Chest pain initial troponin neg but with her HTN elevation kept overnight to control BP , troponins all neg. No prior cardiac hx MD to see.  nuc today and Echo as outpt for HTN unless + Nuc.    A fib - on EKG that is read  as a fib it is SR, she is having freq PACs - with her elevated BP I have given her her pm dose of lopressor. Monitor overnight for PAF. Has hx of TIA but daughter stated it was due to HTN.  HTN poorly controlled giving lopressor 50 now this is time she takes at home. We added amlodipine as well   DM-2 continue home meds  Lt foot wound.  wound nurse consult has been obtained. pt has it followed by podiatrist, with hx of toe amputation and PVD.   Signed, Cecilie Kicks, NP  10/22/2016, 1:24 PM    Please see my separate note. Agree with note as outlined.   Sherren Mocha 10/22/2016 6:45 PM

## 2016-10-22 NOTE — Progress Notes (Signed)
10/22/2016 6:55 PM Discharge AVS meds taken today and those due this evening reviewed.  Follow-up appointments and when to call md reviewed.  D/C IV and TELE.  Questions and concerns addressed.   D/C home per orders. [place]

## 2016-10-22 NOTE — Progress Notes (Signed)
10/21/2016 1845 Received pt to 2W02 from ED.  Pt is A&O and is without C/O.  Tele monitor applied and CCMD notified.  Oriented to room, call light and bed.  Call bell in reach. [place]ney [place]

## 2016-10-22 NOTE — Progress Notes (Signed)
    Subjective:  No further chest pain or shortness of breath  Objective:  Vital Signs in the last 24 hours: Temp:  [97.5 F (36.4 C)-98 F (36.7 C)] 97.6 F (36.4 C) (02/21 1443) Pulse Rate:  [61-86] 77 (02/21 1556) Resp:  [16-25] 16 (02/21 1443) BP: (148-204)/(65-90) 163/69 (02/21 1555) SpO2:  [97 %-99 %] 99 % (02/21 1443) Weight:  [131 lb 4.8 oz (59.6 kg)-132 lb (59.9 kg)] 132 lb (59.9 kg) (02/21 0323)  Intake/Output from previous day: No intake/output data recorded.  Physical Exam: Pt is alert and oriented, NAD HEENT: normal Neck: JVP - normal Lungs: CTA bilaterally CV: RRR without murmur or gallop Abd: soft, NT, Positive BS, no hepatomegaly Ext: no C/C/E, distal pulses intact and equal Skin: warm/dry no rash   Lab Results:  Recent Labs  10/21/16 1115 10/22/16 0600  WBC 10.1 8.6  HGB 13.2 12.5  PLT 245 232    Recent Labs  10/21/16 1115 10/22/16 0600  NA 137 141  K 4.3 4.0  CL 102 102  CO2 27 29  GLUCOSE 135* 127*  BUN 22* 19  CREATININE 1.18* 1.20*    Recent Labs  10/22/16 0011 10/22/16 0600  TROPONINI <0.03 <0.03    Cardiac Studies: Nuclear stress test 10/22/2016:  IMPRESSION: 1. No reversible ischemia or infarction.  2. Normal left ventricular wall motion.  3. Left ventricular ejection fraction 83%  4. Non invasive risk stratification*: Low  Tele: Normal sinus rhythm  Assessment/Plan:  1. Hypertensive emergency: Blood pressure better controlled after addition of amlodipine. Continue combination of metoprolol, losartan, and amlodipine. The patient is currently asymptomatic and stable for discharge.  2. Chest pain at rest: Likely related to problem #1. Nuclear stress scan reviewed with no ischemia. Continue medical management of hypertension and other cardiovascular risk factors.  Disposition: The patient appears stable for discharge. She can follow-up with her primary care physician for ongoing management of hypertension and  diabetes.   Sherren Mocha, M.D. 10/22/2016, 5:51 PM

## 2016-10-22 NOTE — Care Management Obs Status (Signed)
[facility] NOTIFICATION   Patient Details  Name: Sara Griffith MRN: [id number] Date of Birth: 20-Apr-1936   Medicare Observation Status Notification Given:  Yes    Carles Collet, RN 10/22/2016, 5:44 PM

## 2016-10-23 LAB — HEMOGLOBIN A1C
Hgb A1c MFr Bld: 8 % — ABNORMAL HIGH (ref 4.8–5.6)
Mean Plasma Glucose: 183

## 2016-10-24 DIAGNOSIS — E1151 Type 2 diabetes mellitus with diabetic peripheral angiopathy without gangrene: Secondary | ICD-10-CM | POA: Diagnosis not present

## 2016-10-24 DIAGNOSIS — E11621 Type 2 diabetes mellitus with foot ulcer: Secondary | ICD-10-CM | POA: Diagnosis not present

## 2016-10-24 DIAGNOSIS — L97521 Non-pressure chronic ulcer of other part of left foot limited to breakdown of skin: Secondary | ICD-10-CM | POA: Diagnosis not present

## 2016-10-24 DIAGNOSIS — N189 Chronic kidney disease, unspecified: Secondary | ICD-10-CM | POA: Diagnosis not present

## 2016-10-24 DIAGNOSIS — I129 Hypertensive chronic kidney disease with stage 1 through stage 4 chronic kidney disease, or unspecified chronic kidney disease: Secondary | ICD-10-CM | POA: Diagnosis not present

## 2016-10-24 DIAGNOSIS — E1142 Type 2 diabetes mellitus with diabetic polyneuropathy: Secondary | ICD-10-CM | POA: Diagnosis not present

## 2016-10-27 DIAGNOSIS — E1142 Type 2 diabetes mellitus with diabetic polyneuropathy: Secondary | ICD-10-CM | POA: Diagnosis not present

## 2016-10-27 DIAGNOSIS — N189 Chronic kidney disease, unspecified: Secondary | ICD-10-CM | POA: Diagnosis not present

## 2016-10-27 DIAGNOSIS — L97521 Non-pressure chronic ulcer of other part of left foot limited to breakdown of skin: Secondary | ICD-10-CM | POA: Diagnosis not present

## 2016-10-27 DIAGNOSIS — I129 Hypertensive chronic kidney disease with stage 1 through stage 4 chronic kidney disease, or unspecified chronic kidney disease: Secondary | ICD-10-CM | POA: Diagnosis not present

## 2016-10-27 DIAGNOSIS — E1151 Type 2 diabetes mellitus with diabetic peripheral angiopathy without gangrene: Secondary | ICD-10-CM | POA: Diagnosis not present

## 2016-10-27 DIAGNOSIS — E11621 Type 2 diabetes mellitus with foot ulcer: Secondary | ICD-10-CM | POA: Diagnosis not present

## 2016-10-29 DIAGNOSIS — E11621 Type 2 diabetes mellitus with foot ulcer: Secondary | ICD-10-CM | POA: Diagnosis not present

## 2016-10-29 DIAGNOSIS — L97521 Non-pressure chronic ulcer of other part of left foot limited to breakdown of skin: Secondary | ICD-10-CM | POA: Diagnosis not present

## 2016-10-29 DIAGNOSIS — E1142 Type 2 diabetes mellitus with diabetic polyneuropathy: Secondary | ICD-10-CM | POA: Diagnosis not present

## 2016-10-29 DIAGNOSIS — E1151 Type 2 diabetes mellitus with diabetic peripheral angiopathy without gangrene: Secondary | ICD-10-CM | POA: Diagnosis not present

## 2016-10-29 DIAGNOSIS — N189 Chronic kidney disease, unspecified: Secondary | ICD-10-CM | POA: Diagnosis not present

## 2016-10-29 DIAGNOSIS — I129 Hypertensive chronic kidney disease with stage 1 through stage 4 chronic kidney disease, or unspecified chronic kidney disease: Secondary | ICD-10-CM | POA: Diagnosis not present

## 2016-10-30 DIAGNOSIS — E1142 Type 2 diabetes mellitus with diabetic polyneuropathy: Secondary | ICD-10-CM | POA: Diagnosis not present

## 2016-10-30 DIAGNOSIS — E1151 Type 2 diabetes mellitus with diabetic peripheral angiopathy without gangrene: Secondary | ICD-10-CM | POA: Diagnosis not present

## 2016-10-30 DIAGNOSIS — L97521 Non-pressure chronic ulcer of other part of left foot limited to breakdown of skin: Secondary | ICD-10-CM | POA: Diagnosis not present

## 2016-10-30 DIAGNOSIS — N189 Chronic kidney disease, unspecified: Secondary | ICD-10-CM | POA: Diagnosis not present

## 2016-10-30 DIAGNOSIS — I129 Hypertensive chronic kidney disease with stage 1 through stage 4 chronic kidney disease, or unspecified chronic kidney disease: Secondary | ICD-10-CM | POA: Diagnosis not present

## 2016-10-30 DIAGNOSIS — E11621 Type 2 diabetes mellitus with foot ulcer: Secondary | ICD-10-CM | POA: Diagnosis not present

## 2016-10-31 DIAGNOSIS — N189 Chronic kidney disease, unspecified: Secondary | ICD-10-CM | POA: Diagnosis not present

## 2016-10-31 DIAGNOSIS — E11621 Type 2 diabetes mellitus with foot ulcer: Secondary | ICD-10-CM | POA: Diagnosis not present

## 2016-10-31 DIAGNOSIS — I129 Hypertensive chronic kidney disease with stage 1 through stage 4 chronic kidney disease, or unspecified chronic kidney disease: Secondary | ICD-10-CM | POA: Diagnosis not present

## 2016-10-31 DIAGNOSIS — L97521 Non-pressure chronic ulcer of other part of left foot limited to breakdown of skin: Secondary | ICD-10-CM | POA: Diagnosis not present

## 2016-10-31 DIAGNOSIS — E1151 Type 2 diabetes mellitus with diabetic peripheral angiopathy without gangrene: Secondary | ICD-10-CM | POA: Diagnosis not present

## 2016-10-31 DIAGNOSIS — E1142 Type 2 diabetes mellitus with diabetic polyneuropathy: Secondary | ICD-10-CM | POA: Diagnosis not present

## 2016-11-03 DIAGNOSIS — L97521 Non-pressure chronic ulcer of other part of left foot limited to breakdown of skin: Secondary | ICD-10-CM | POA: Diagnosis not present

## 2016-11-03 DIAGNOSIS — E1151 Type 2 diabetes mellitus with diabetic peripheral angiopathy without gangrene: Secondary | ICD-10-CM | POA: Diagnosis not present

## 2016-11-03 DIAGNOSIS — I129 Hypertensive chronic kidney disease with stage 1 through stage 4 chronic kidney disease, or unspecified chronic kidney disease: Secondary | ICD-10-CM | POA: Diagnosis not present

## 2016-11-03 DIAGNOSIS — N189 Chronic kidney disease, unspecified: Secondary | ICD-10-CM | POA: Diagnosis not present

## 2016-11-03 DIAGNOSIS — E11621 Type 2 diabetes mellitus with foot ulcer: Secondary | ICD-10-CM | POA: Diagnosis not present

## 2016-11-03 DIAGNOSIS — E1142 Type 2 diabetes mellitus with diabetic polyneuropathy: Secondary | ICD-10-CM | POA: Diagnosis not present

## 2016-11-04 DIAGNOSIS — E11621 Type 2 diabetes mellitus with foot ulcer: Secondary | ICD-10-CM | POA: Diagnosis not present

## 2016-11-04 DIAGNOSIS — E1151 Type 2 diabetes mellitus with diabetic peripheral angiopathy without gangrene: Secondary | ICD-10-CM | POA: Diagnosis not present

## 2016-11-04 DIAGNOSIS — N189 Chronic kidney disease, unspecified: Secondary | ICD-10-CM | POA: Diagnosis not present

## 2016-11-04 DIAGNOSIS — E1142 Type 2 diabetes mellitus with diabetic polyneuropathy: Secondary | ICD-10-CM | POA: Diagnosis not present

## 2016-11-04 DIAGNOSIS — L97521 Non-pressure chronic ulcer of other part of left foot limited to breakdown of skin: Secondary | ICD-10-CM | POA: Diagnosis not present

## 2016-11-04 DIAGNOSIS — I129 Hypertensive chronic kidney disease with stage 1 through stage 4 chronic kidney disease, or unspecified chronic kidney disease: Secondary | ICD-10-CM | POA: Diagnosis not present

## 2016-11-05 ENCOUNTER — Ambulatory Visit: Payer: Medicare HMO | Admitting: Physician Assistant

## 2016-11-05 DIAGNOSIS — N189 Chronic kidney disease, unspecified: Secondary | ICD-10-CM | POA: Diagnosis not present

## 2016-11-05 DIAGNOSIS — E1151 Type 2 diabetes mellitus with diabetic peripheral angiopathy without gangrene: Secondary | ICD-10-CM | POA: Diagnosis not present

## 2016-11-05 DIAGNOSIS — L97521 Non-pressure chronic ulcer of other part of left foot limited to breakdown of skin: Secondary | ICD-10-CM | POA: Diagnosis not present

## 2016-11-05 DIAGNOSIS — E11621 Type 2 diabetes mellitus with foot ulcer: Secondary | ICD-10-CM | POA: Diagnosis not present

## 2016-11-05 DIAGNOSIS — I129 Hypertensive chronic kidney disease with stage 1 through stage 4 chronic kidney disease, or unspecified chronic kidney disease: Secondary | ICD-10-CM | POA: Diagnosis not present

## 2016-11-05 DIAGNOSIS — E1142 Type 2 diabetes mellitus with diabetic polyneuropathy: Secondary | ICD-10-CM | POA: Diagnosis not present

## 2016-11-06 DIAGNOSIS — I129 Hypertensive chronic kidney disease with stage 1 through stage 4 chronic kidney disease, or unspecified chronic kidney disease: Secondary | ICD-10-CM | POA: Diagnosis not present

## 2016-11-06 DIAGNOSIS — E11621 Type 2 diabetes mellitus with foot ulcer: Secondary | ICD-10-CM | POA: Diagnosis not present

## 2016-11-06 DIAGNOSIS — N189 Chronic kidney disease, unspecified: Secondary | ICD-10-CM | POA: Diagnosis not present

## 2016-11-06 DIAGNOSIS — L97521 Non-pressure chronic ulcer of other part of left foot limited to breakdown of skin: Secondary | ICD-10-CM | POA: Diagnosis not present

## 2016-11-06 DIAGNOSIS — E1142 Type 2 diabetes mellitus with diabetic polyneuropathy: Secondary | ICD-10-CM | POA: Diagnosis not present

## 2016-11-06 DIAGNOSIS — E1151 Type 2 diabetes mellitus with diabetic peripheral angiopathy without gangrene: Secondary | ICD-10-CM | POA: Diagnosis not present

## 2016-11-07 DIAGNOSIS — I129 Hypertensive chronic kidney disease with stage 1 through stage 4 chronic kidney disease, or unspecified chronic kidney disease: Secondary | ICD-10-CM | POA: Diagnosis not present

## 2016-11-07 DIAGNOSIS — E1142 Type 2 diabetes mellitus with diabetic polyneuropathy: Secondary | ICD-10-CM | POA: Diagnosis not present

## 2016-11-07 DIAGNOSIS — E11621 Type 2 diabetes mellitus with foot ulcer: Secondary | ICD-10-CM | POA: Diagnosis not present

## 2016-11-07 DIAGNOSIS — N189 Chronic kidney disease, unspecified: Secondary | ICD-10-CM | POA: Diagnosis not present

## 2016-11-07 DIAGNOSIS — E1151 Type 2 diabetes mellitus with diabetic peripheral angiopathy without gangrene: Secondary | ICD-10-CM | POA: Diagnosis not present

## 2016-11-07 DIAGNOSIS — L97521 Non-pressure chronic ulcer of other part of left foot limited to breakdown of skin: Secondary | ICD-10-CM | POA: Diagnosis not present

## 2016-11-10 ENCOUNTER — Ambulatory Visit: Payer: Medicare HMO | Admitting: Physician Assistant

## 2016-11-11 DIAGNOSIS — E11621 Type 2 diabetes mellitus with foot ulcer: Secondary | ICD-10-CM | POA: Diagnosis not present

## 2016-11-11 DIAGNOSIS — E1151 Type 2 diabetes mellitus with diabetic peripheral angiopathy without gangrene: Secondary | ICD-10-CM | POA: Diagnosis not present

## 2016-11-11 DIAGNOSIS — I129 Hypertensive chronic kidney disease with stage 1 through stage 4 chronic kidney disease, or unspecified chronic kidney disease: Secondary | ICD-10-CM | POA: Diagnosis not present

## 2016-11-11 DIAGNOSIS — N189 Chronic kidney disease, unspecified: Secondary | ICD-10-CM | POA: Diagnosis not present

## 2016-11-11 DIAGNOSIS — L97521 Non-pressure chronic ulcer of other part of left foot limited to breakdown of skin: Secondary | ICD-10-CM | POA: Diagnosis not present

## 2016-11-11 DIAGNOSIS — E1142 Type 2 diabetes mellitus with diabetic polyneuropathy: Secondary | ICD-10-CM | POA: Diagnosis not present

## 2016-11-13 DIAGNOSIS — E11621 Type 2 diabetes mellitus with foot ulcer: Secondary | ICD-10-CM | POA: Diagnosis not present

## 2016-11-13 DIAGNOSIS — E1142 Type 2 diabetes mellitus with diabetic polyneuropathy: Secondary | ICD-10-CM | POA: Diagnosis not present

## 2016-11-13 DIAGNOSIS — E1151 Type 2 diabetes mellitus with diabetic peripheral angiopathy without gangrene: Secondary | ICD-10-CM | POA: Diagnosis not present

## 2016-11-13 DIAGNOSIS — N189 Chronic kidney disease, unspecified: Secondary | ICD-10-CM | POA: Diagnosis not present

## 2016-11-13 DIAGNOSIS — I129 Hypertensive chronic kidney disease with stage 1 through stage 4 chronic kidney disease, or unspecified chronic kidney disease: Secondary | ICD-10-CM | POA: Diagnosis not present

## 2016-11-13 DIAGNOSIS — L97521 Non-pressure chronic ulcer of other part of left foot limited to breakdown of skin: Secondary | ICD-10-CM | POA: Diagnosis not present

## 2016-11-14 DIAGNOSIS — E1142 Type 2 diabetes mellitus with diabetic polyneuropathy: Secondary | ICD-10-CM | POA: Diagnosis not present

## 2016-11-14 DIAGNOSIS — E11621 Type 2 diabetes mellitus with foot ulcer: Secondary | ICD-10-CM | POA: Diagnosis not present

## 2016-11-14 DIAGNOSIS — I129 Hypertensive chronic kidney disease with stage 1 through stage 4 chronic kidney disease, or unspecified chronic kidney disease: Secondary | ICD-10-CM | POA: Diagnosis not present

## 2016-11-14 DIAGNOSIS — N189 Chronic kidney disease, unspecified: Secondary | ICD-10-CM | POA: Diagnosis not present

## 2016-11-14 DIAGNOSIS — L97521 Non-pressure chronic ulcer of other part of left foot limited to breakdown of skin: Secondary | ICD-10-CM | POA: Diagnosis not present

## 2016-11-14 DIAGNOSIS — E1151 Type 2 diabetes mellitus with diabetic peripheral angiopathy without gangrene: Secondary | ICD-10-CM | POA: Diagnosis not present

## 2016-11-15 DIAGNOSIS — E1122 Type 2 diabetes mellitus with diabetic chronic kidney disease: Secondary | ICD-10-CM | POA: Diagnosis not present

## 2016-11-15 DIAGNOSIS — E1151 Type 2 diabetes mellitus with diabetic peripheral angiopathy without gangrene: Secondary | ICD-10-CM | POA: Diagnosis not present

## 2016-11-15 DIAGNOSIS — I129 Hypertensive chronic kidney disease with stage 1 through stage 4 chronic kidney disease, or unspecified chronic kidney disease: Secondary | ICD-10-CM | POA: Diagnosis not present

## 2016-11-15 DIAGNOSIS — L97521 Non-pressure chronic ulcer of other part of left foot limited to breakdown of skin: Secondary | ICD-10-CM | POA: Diagnosis not present

## 2016-11-15 DIAGNOSIS — N189 Chronic kidney disease, unspecified: Secondary | ICD-10-CM | POA: Diagnosis not present

## 2016-11-15 DIAGNOSIS — E11621 Type 2 diabetes mellitus with foot ulcer: Secondary | ICD-10-CM | POA: Diagnosis not present

## 2016-11-18 DIAGNOSIS — L97521 Non-pressure chronic ulcer of other part of left foot limited to breakdown of skin: Secondary | ICD-10-CM | POA: Diagnosis not present

## 2016-11-18 DIAGNOSIS — I129 Hypertensive chronic kidney disease with stage 1 through stage 4 chronic kidney disease, or unspecified chronic kidney disease: Secondary | ICD-10-CM | POA: Diagnosis not present

## 2016-11-18 DIAGNOSIS — E11621 Type 2 diabetes mellitus with foot ulcer: Secondary | ICD-10-CM | POA: Diagnosis not present

## 2016-11-18 DIAGNOSIS — E1151 Type 2 diabetes mellitus with diabetic peripheral angiopathy without gangrene: Secondary | ICD-10-CM | POA: Diagnosis not present

## 2016-11-18 DIAGNOSIS — N189 Chronic kidney disease, unspecified: Secondary | ICD-10-CM | POA: Diagnosis not present

## 2016-11-18 DIAGNOSIS — E1122 Type 2 diabetes mellitus with diabetic chronic kidney disease: Secondary | ICD-10-CM | POA: Diagnosis not present

## 2016-11-20 DIAGNOSIS — I129 Hypertensive chronic kidney disease with stage 1 through stage 4 chronic kidney disease, or unspecified chronic kidney disease: Secondary | ICD-10-CM | POA: Diagnosis not present

## 2016-11-20 DIAGNOSIS — N189 Chronic kidney disease, unspecified: Secondary | ICD-10-CM | POA: Diagnosis not present

## 2016-11-20 DIAGNOSIS — E1151 Type 2 diabetes mellitus with diabetic peripheral angiopathy without gangrene: Secondary | ICD-10-CM | POA: Diagnosis not present

## 2016-11-20 DIAGNOSIS — E1122 Type 2 diabetes mellitus with diabetic chronic kidney disease: Secondary | ICD-10-CM | POA: Diagnosis not present

## 2016-11-20 DIAGNOSIS — E11621 Type 2 diabetes mellitus with foot ulcer: Secondary | ICD-10-CM | POA: Diagnosis not present

## 2016-11-20 DIAGNOSIS — L97521 Non-pressure chronic ulcer of other part of left foot limited to breakdown of skin: Secondary | ICD-10-CM | POA: Diagnosis not present

## 2016-11-21 DIAGNOSIS — I129 Hypertensive chronic kidney disease with stage 1 through stage 4 chronic kidney disease, or unspecified chronic kidney disease: Secondary | ICD-10-CM | POA: Diagnosis not present

## 2016-11-21 DIAGNOSIS — L97521 Non-pressure chronic ulcer of other part of left foot limited to breakdown of skin: Secondary | ICD-10-CM | POA: Diagnosis not present

## 2016-11-21 DIAGNOSIS — N189 Chronic kidney disease, unspecified: Secondary | ICD-10-CM | POA: Diagnosis not present

## 2016-11-21 DIAGNOSIS — E1151 Type 2 diabetes mellitus with diabetic peripheral angiopathy without gangrene: Secondary | ICD-10-CM | POA: Diagnosis not present

## 2016-11-21 DIAGNOSIS — E1122 Type 2 diabetes mellitus with diabetic chronic kidney disease: Secondary | ICD-10-CM | POA: Diagnosis not present

## 2016-11-21 DIAGNOSIS — E11621 Type 2 diabetes mellitus with foot ulcer: Secondary | ICD-10-CM | POA: Diagnosis not present

## 2016-11-24 DIAGNOSIS — N189 Chronic kidney disease, unspecified: Secondary | ICD-10-CM | POA: Diagnosis not present

## 2016-11-24 DIAGNOSIS — E1122 Type 2 diabetes mellitus with diabetic chronic kidney disease: Secondary | ICD-10-CM | POA: Diagnosis not present

## 2016-11-24 DIAGNOSIS — E11621 Type 2 diabetes mellitus with foot ulcer: Secondary | ICD-10-CM | POA: Diagnosis not present

## 2016-11-24 DIAGNOSIS — L97521 Non-pressure chronic ulcer of other part of left foot limited to breakdown of skin: Secondary | ICD-10-CM | POA: Diagnosis not present

## 2016-11-24 DIAGNOSIS — E1151 Type 2 diabetes mellitus with diabetic peripheral angiopathy without gangrene: Secondary | ICD-10-CM | POA: Diagnosis not present

## 2016-11-24 DIAGNOSIS — I129 Hypertensive chronic kidney disease with stage 1 through stage 4 chronic kidney disease, or unspecified chronic kidney disease: Secondary | ICD-10-CM | POA: Diagnosis not present

## 2016-11-25 DIAGNOSIS — I739 Peripheral vascular disease, unspecified: Secondary | ICD-10-CM | POA: Diagnosis not present

## 2016-11-25 DIAGNOSIS — L97522 Non-pressure chronic ulcer of other part of left foot with fat layer exposed: Secondary | ICD-10-CM | POA: Diagnosis not present

## 2016-11-25 DIAGNOSIS — E1151 Type 2 diabetes mellitus with diabetic peripheral angiopathy without gangrene: Secondary | ICD-10-CM | POA: Diagnosis not present

## 2016-11-25 DIAGNOSIS — M2041 Other hammer toe(s) (acquired), right foot: Secondary | ICD-10-CM | POA: Diagnosis not present

## 2016-11-25 DIAGNOSIS — L603 Nail dystrophy: Secondary | ICD-10-CM | POA: Diagnosis not present

## 2016-11-25 DIAGNOSIS — M2042 Other hammer toe(s) (acquired), left foot: Secondary | ICD-10-CM | POA: Diagnosis not present

## 2016-11-25 DIAGNOSIS — L84 Corns and callosities: Secondary | ICD-10-CM | POA: Diagnosis not present

## 2016-11-25 NOTE — Progress Notes (Signed)
Cardiology Office Note    Date:  11/27/2016   ID:  Sara Griffith, DOB 1935-09-28, MRN [id number]  PCP:  Sara Solo, MD  Cardiologist:  Dr. Burt Knack  Chief Complaint: Hospital follow for cp and hypertensive urgency  History of Present Illness:   Sara Griffith is a 81 y.o. female  with hx of DM-2, HTN, TIA, PVD, GERD and CKD Presents for hospital follow-up  Admitted February 2018 for chest pain and hypertensive urgency. There was ? regarding new onset atrial fibrillation, however EKGs were reviewed and it was determined that her underlying rhythm was actually SR with PACs. No afib was observed during her hospitalization. Amlodipine was added to her metoprolol and losartan and her BP improved. Her CP resolved with improvement in BP. Cardiac enzymes were cycled and negative x 3. She underwent a NST that was low risk for ischemia. It was felt that her CP was 2/2 to HTN urgency.  Here today for follow up. Blood pressure has been normal at home. No further episode of chest pain or dyspnea. Walking  with walker without any issue. The patient denies nausea, vomiting, fever, chest pain, palpitations, shortness of breath, orthopnea, PND, dizziness, syncope, cough, congestion, abdominal pain, hematochezia, melena, lower extremity edema.    Past Medical History:  Diagnosis Date  . CKD (chronic kidney disease)   . Diabetes mellitus without complication ([place])   . GERD (gastroesophageal reflux disease)   . HLD (hyperlipidemia)   . Hypertension   . IDA (iron deficiency anemia)   . PVD (peripheral vascular disease) ([place])   . RLS (restless legs syndrome)   . TIA (transient ischemic attack)   . Uses walker   . Vitamin D deficiency     Past Surgical History:  Procedure Laterality Date  . ABDOMINAL HYSTERECTOMY     with bladder tac  . APPENDECTOMY    . CARPAL TUNNEL RELEASE Right   . CATARACT EXTRACTION, BILATERAL    . CERVICAL DISC SURGERY    . COLONOSCOPY    . DILATION AND CURETTAGE OF  UTERUS     x 2  . left great toe amputation    . ROTATOR CUFF REPAIR Left     Current Medications: Prior to Admission medications   Medication Sig Start Date End Date Taking? Authorizing Provider  amLODipine (NORVASC) 5 MG tablet Take 1 tablet (5 mg total) by mouth daily. 10/23/16   Brittainy Erie Noe, PA-C  aspirin EC 81 MG tablet Take 81 mg by mouth daily.    Historical Provider, MD  cholecalciferol (VITAMIN D) 1000 units tablet Take 1,000 Units by mouth daily.    Historical Provider, MD  ferrous sulfate 325 (65 FE) MG tablet Take 325 mg by mouth 2 (two) times daily. 06/28/16   Historical Provider, MD  folic acid (FOLVITE) 1 MG tablet Take 0.5 tablets (0.5 mg total) by mouth daily. Patient taking differently: Take 1 mg by mouth daily.  07/05/16   Eugenie Filler, MD  HYDROcodone-acetaminophen (NORCO/VICODIN) 5-325 MG tablet Take 1-2 tablets by mouth every 4 (four) hours as needed for moderate pain. Patient not taking: Reported on 10/22/2016 07/04/16   Eugenie Filler, MD  losartan (COZAAR) 100 MG tablet Take 100 mg by mouth daily. 05/30/16   Historical Provider, MD  metoprolol tartrate (LOPRESSOR) 50 MG tablet Take 1 tablet (50 mg total) by mouth 2 (two) times daily with a meal. 07/04/16   Eugenie Filler, MD  omega-3 acid ethyl esters (LOVAZA) 1 g capsule Take 1  g by mouth 3 (three) times daily.  06/28/16   Historical Provider, MD  omeprazole (PRILOSEC) 40 MG capsule Take 40 mg by mouth daily. 06/28/16   Historical Provider, MD  OVER THE COUNTER MEDICATION Place 1 drop into both eyes as needed (dry eyes). Over the counter dry eye drops.    Historical Provider, MD  Ropinirole HCl 6 MG TB24 Take 6 mg by mouth at bedtime.  05/09/16   Historical Provider, MD  simvastatin (ZOCOR) 40 MG tablet Take 40 mg by mouth at bedtime.  05/03/16   Historical Provider, MD  TRADJENTA 5 MG TABS tablet Take 5 mg by mouth daily. 06/06/16   Historical Provider, MD  traMADol (ULTRAM) 50 MG tablet Take 50 mg by mouth  3 (three) times daily. 06/05/16   Historical Provider, MD  vitamin E 400 UNIT capsule Take 400 Units by mouth daily.    Historical Provider, MD    Allergies:   Cymbalta [duloxetine hcl]; Librax [chlordiazepoxide-clidinium]; Lyrica [pregabalin]; and Naproxen   Social History   Social History  . Marital status: Widowed    Spouse name: N/A  . Number of children: N/A  . Years of education: N/A   Social History Main Topics  . Smoking status: Former Smoker    Years: 30.00    Types: Cigarettes  . Smokeless tobacco: Former Systems developer    Types: Snuff    Quit date: 07/01/1954  . Alcohol use No  . Drug use: No  . Sexual activity: Not Asked   Other Topics Concern  . None   Social History Narrative  . None     Family History:  The patient's family history includes Arrhythmia in her brother and sister; Cancer in her father; Hypertension in her mother.   ROS:   Please see the history of present illness.    ROS All other systems reviewed and are negative.   PHYSICAL EXAM:   VS:  BP 120/62   Pulse 65   Ht 4\' 11"  (1.499 m)   Wt 133 lb 1.9 oz (60.4 kg)   LMP  (LMP Unknown)   SpO2 96%   BMI 26.89 kg/m    GEN: Well nourished, well developed, in no acute distress  HEENT: normal  Neck: no JVD, carotid bruits, or masses Cardiac: RRR; no murmurs, rubs, or gallops,no edema  Respiratory:  clear to auscultation bilaterally, normal work of breathing GI: soft, nontender, nondistended, + BS MS: no deformity or atrophy  Skin: warm and dry, no rash Neuro:  Alert and Oriented x 3, Strength and sensation are intact Psych: euthymic mood, full affect  Wt Readings from Last 3 Encounters:  11/27/16 133 lb 1.9 oz (60.4 kg)  10/22/16 132 lb (59.9 kg)  07/02/16 135 lb (61.2 kg)      Studies/Labs Reviewed:   EKG:  EKG is ordered today.  The ekg ordered today demonstrates NSR at rate of 65 bpm.   Recent Labs: 10/21/2016: ALT 15; B Natriuretic Peptide 295.2; Magnesium 1.6; TSH 6.254 10/22/2016: BUN  19; Creatinine, Ser 1.20; Hemoglobin 12.5; Platelets 232; Potassium 4.0; Sodium 141   Lipid Panel    Component Value Date/Time   CHOL 145 10/22/2016 0600   TRIG 151 (H) 10/22/2016 0600   HDL 40 (L) 10/22/2016 0600   CHOLHDL 3.6 10/22/2016 0600   VLDL 30 10/22/2016 0600   LDLCALC 75 10/22/2016 0600    Additional studies/ records that were reviewed today include:  NST 10/22/16   FINDINGS: Perfusion: No decreased activity in the  left ventricle on stress imaging to suggest reversible ischemia or infarction.  Wall Motion: Normal left ventricular wall motion. No left ventricular dilation.  Left Ventricular Ejection Fraction: 83 %  End diastolic volume 45 ml  End systolic volume 8 ml  IMPRESSION: 1. No reversible ischemia or infarction.  2. Normal left ventricular wall motion.  3. Left ventricular ejection fraction 83%  4. Non invasive risk stratification*: Low    ASSESSMENT & PLAN:    1. HTN - Stable and well controlled. Continue current medications.   2. Chest pain - Resolved with normalization of BP. No recurrence pain. Normal stress test during admission. EKG without acute changes today.     Medication Adjustments/Labs and Tests Ordered: Current medicines are reviewed at length with the patient today.  Concerns regarding medicines are outlined above.  Medication changes, Labs and Tests ordered today are listed in the Patient Instructions below. Patient Instructions  Medication Instructions:  Your physician recommends that you continue on your current medications as directed. Please refer to the Current Medication list given to you today.   Labwork: None ordered  Testing/Procedures: None ordered  Follow-Up: Your physician wants you to follow-up in: 6 months with Dr. Burt Knack. You will receive a reminder letter in the mail two months in advance. If you don't receive a letter, please call our office to schedule the follow-up appointment.   Any  Other Special Instructions Will Be Listed Below (If Applicable).     If you need a refill on your cardiac medications before your next appointment, please call your pharmacy.     Sara Griffith, [place]  11/27/2016 1:58 PM    [facility] [address], [place], [place]  [address] Phone: [phone]; Fax: [phone]

## 2016-11-26 DIAGNOSIS — E11621 Type 2 diabetes mellitus with foot ulcer: Secondary | ICD-10-CM | POA: Diagnosis not present

## 2016-11-26 DIAGNOSIS — E1122 Type 2 diabetes mellitus with diabetic chronic kidney disease: Secondary | ICD-10-CM | POA: Diagnosis not present

## 2016-11-26 DIAGNOSIS — N189 Chronic kidney disease, unspecified: Secondary | ICD-10-CM | POA: Diagnosis not present

## 2016-11-26 DIAGNOSIS — E1151 Type 2 diabetes mellitus with diabetic peripheral angiopathy without gangrene: Secondary | ICD-10-CM | POA: Diagnosis not present

## 2016-11-26 DIAGNOSIS — L97521 Non-pressure chronic ulcer of other part of left foot limited to breakdown of skin: Secondary | ICD-10-CM | POA: Diagnosis not present

## 2016-11-26 DIAGNOSIS — I129 Hypertensive chronic kidney disease with stage 1 through stage 4 chronic kidney disease, or unspecified chronic kidney disease: Secondary | ICD-10-CM | POA: Diagnosis not present

## 2016-11-27 ENCOUNTER — Encounter: Payer: Self-pay | Admitting: Physician Assistant

## 2016-11-27 ENCOUNTER — Ambulatory Visit (INDEPENDENT_AMBULATORY_CARE_PROVIDER_SITE_OTHER): Payer: Medicare HMO | Admitting: Physician Assistant

## 2016-11-27 VITALS — BP 120/62 | HR 65 | Ht 59.0 in | Wt 133.1 lb

## 2016-11-27 DIAGNOSIS — I129 Hypertensive chronic kidney disease with stage 1 through stage 4 chronic kidney disease, or unspecified chronic kidney disease: Secondary | ICD-10-CM | POA: Diagnosis not present

## 2016-11-27 DIAGNOSIS — I10 Essential (primary) hypertension: Secondary | ICD-10-CM | POA: Diagnosis not present

## 2016-11-27 DIAGNOSIS — R0789 Other chest pain: Secondary | ICD-10-CM | POA: Diagnosis not present

## 2016-11-27 DIAGNOSIS — N189 Chronic kidney disease, unspecified: Secondary | ICD-10-CM | POA: Diagnosis not present

## 2016-11-27 DIAGNOSIS — L97521 Non-pressure chronic ulcer of other part of left foot limited to breakdown of skin: Secondary | ICD-10-CM | POA: Diagnosis not present

## 2016-11-27 DIAGNOSIS — E1151 Type 2 diabetes mellitus with diabetic peripheral angiopathy without gangrene: Secondary | ICD-10-CM | POA: Diagnosis not present

## 2016-11-27 DIAGNOSIS — E11621 Type 2 diabetes mellitus with foot ulcer: Secondary | ICD-10-CM | POA: Diagnosis not present

## 2016-11-27 DIAGNOSIS — E1122 Type 2 diabetes mellitus with diabetic chronic kidney disease: Secondary | ICD-10-CM | POA: Diagnosis not present

## 2016-11-27 NOTE — Patient Instructions (Signed)
Medication Instructions:  Your physician recommends that you continue on your current medications as directed. Please refer to the Current Medication list given to you today.   Labwork: None ordered  Testing/Procedures: None ordered  Follow-Up: Your physician wants you to follow-up in: 6 months with Dr.Cooper You will receive a reminder letter in the mail two months in advance. If you don't receive a letter, please call our office to schedule the follow-up appointment.    Any Other Special Instructions Will Be Listed Below (If Applicable).     If you need a refill on your cardiac medications before your next appointment, please call your pharmacy.   

## 2016-11-28 DIAGNOSIS — I129 Hypertensive chronic kidney disease with stage 1 through stage 4 chronic kidney disease, or unspecified chronic kidney disease: Secondary | ICD-10-CM | POA: Diagnosis not present

## 2016-11-28 DIAGNOSIS — E11621 Type 2 diabetes mellitus with foot ulcer: Secondary | ICD-10-CM | POA: Diagnosis not present

## 2016-11-28 DIAGNOSIS — N189 Chronic kidney disease, unspecified: Secondary | ICD-10-CM | POA: Diagnosis not present

## 2016-11-28 DIAGNOSIS — E1151 Type 2 diabetes mellitus with diabetic peripheral angiopathy without gangrene: Secondary | ICD-10-CM | POA: Diagnosis not present

## 2016-11-28 DIAGNOSIS — E1122 Type 2 diabetes mellitus with diabetic chronic kidney disease: Secondary | ICD-10-CM | POA: Diagnosis not present

## 2016-11-28 DIAGNOSIS — L97521 Non-pressure chronic ulcer of other part of left foot limited to breakdown of skin: Secondary | ICD-10-CM | POA: Diagnosis not present

## 2016-12-01 DIAGNOSIS — L97521 Non-pressure chronic ulcer of other part of left foot limited to breakdown of skin: Secondary | ICD-10-CM | POA: Diagnosis not present

## 2016-12-01 DIAGNOSIS — E1122 Type 2 diabetes mellitus with diabetic chronic kidney disease: Secondary | ICD-10-CM | POA: Diagnosis not present

## 2016-12-01 DIAGNOSIS — N189 Chronic kidney disease, unspecified: Secondary | ICD-10-CM | POA: Diagnosis not present

## 2016-12-01 DIAGNOSIS — I129 Hypertensive chronic kidney disease with stage 1 through stage 4 chronic kidney disease, or unspecified chronic kidney disease: Secondary | ICD-10-CM | POA: Diagnosis not present

## 2016-12-01 DIAGNOSIS — E11621 Type 2 diabetes mellitus with foot ulcer: Secondary | ICD-10-CM | POA: Diagnosis not present

## 2016-12-01 DIAGNOSIS — E1151 Type 2 diabetes mellitus with diabetic peripheral angiopathy without gangrene: Secondary | ICD-10-CM | POA: Diagnosis not present

## 2016-12-02 DIAGNOSIS — I129 Hypertensive chronic kidney disease with stage 1 through stage 4 chronic kidney disease, or unspecified chronic kidney disease: Secondary | ICD-10-CM | POA: Diagnosis not present

## 2016-12-02 DIAGNOSIS — E11621 Type 2 diabetes mellitus with foot ulcer: Secondary | ICD-10-CM | POA: Diagnosis not present

## 2016-12-02 DIAGNOSIS — E1151 Type 2 diabetes mellitus with diabetic peripheral angiopathy without gangrene: Secondary | ICD-10-CM | POA: Diagnosis not present

## 2016-12-02 DIAGNOSIS — N189 Chronic kidney disease, unspecified: Secondary | ICD-10-CM | POA: Diagnosis not present

## 2016-12-02 DIAGNOSIS — L97522 Non-pressure chronic ulcer of other part of left foot with fat layer exposed: Secondary | ICD-10-CM | POA: Diagnosis not present

## 2016-12-02 DIAGNOSIS — L97521 Non-pressure chronic ulcer of other part of left foot limited to breakdown of skin: Secondary | ICD-10-CM | POA: Diagnosis not present

## 2016-12-02 DIAGNOSIS — E1122 Type 2 diabetes mellitus with diabetic chronic kidney disease: Secondary | ICD-10-CM | POA: Diagnosis not present

## 2016-12-03 DIAGNOSIS — E11621 Type 2 diabetes mellitus with foot ulcer: Secondary | ICD-10-CM | POA: Diagnosis not present

## 2016-12-03 DIAGNOSIS — N189 Chronic kidney disease, unspecified: Secondary | ICD-10-CM | POA: Diagnosis not present

## 2016-12-03 DIAGNOSIS — E1151 Type 2 diabetes mellitus with diabetic peripheral angiopathy without gangrene: Secondary | ICD-10-CM | POA: Diagnosis not present

## 2016-12-03 DIAGNOSIS — E1122 Type 2 diabetes mellitus with diabetic chronic kidney disease: Secondary | ICD-10-CM | POA: Diagnosis not present

## 2016-12-03 DIAGNOSIS — I129 Hypertensive chronic kidney disease with stage 1 through stage 4 chronic kidney disease, or unspecified chronic kidney disease: Secondary | ICD-10-CM | POA: Diagnosis not present

## 2016-12-03 DIAGNOSIS — L97521 Non-pressure chronic ulcer of other part of left foot limited to breakdown of skin: Secondary | ICD-10-CM | POA: Diagnosis not present

## 2016-12-04 DIAGNOSIS — E1151 Type 2 diabetes mellitus with diabetic peripheral angiopathy without gangrene: Secondary | ICD-10-CM | POA: Diagnosis not present

## 2016-12-04 DIAGNOSIS — E1122 Type 2 diabetes mellitus with diabetic chronic kidney disease: Secondary | ICD-10-CM | POA: Diagnosis not present

## 2016-12-04 DIAGNOSIS — L97521 Non-pressure chronic ulcer of other part of left foot limited to breakdown of skin: Secondary | ICD-10-CM | POA: Diagnosis not present

## 2016-12-04 DIAGNOSIS — I129 Hypertensive chronic kidney disease with stage 1 through stage 4 chronic kidney disease, or unspecified chronic kidney disease: Secondary | ICD-10-CM | POA: Diagnosis not present

## 2016-12-04 DIAGNOSIS — N189 Chronic kidney disease, unspecified: Secondary | ICD-10-CM | POA: Diagnosis not present

## 2016-12-04 DIAGNOSIS — E11621 Type 2 diabetes mellitus with foot ulcer: Secondary | ICD-10-CM | POA: Diagnosis not present

## 2016-12-05 DIAGNOSIS — E1151 Type 2 diabetes mellitus with diabetic peripheral angiopathy without gangrene: Secondary | ICD-10-CM | POA: Diagnosis not present

## 2016-12-05 DIAGNOSIS — E1122 Type 2 diabetes mellitus with diabetic chronic kidney disease: Secondary | ICD-10-CM | POA: Diagnosis not present

## 2016-12-05 DIAGNOSIS — E11621 Type 2 diabetes mellitus with foot ulcer: Secondary | ICD-10-CM | POA: Diagnosis not present

## 2016-12-05 DIAGNOSIS — N189 Chronic kidney disease, unspecified: Secondary | ICD-10-CM | POA: Diagnosis not present

## 2016-12-05 DIAGNOSIS — I129 Hypertensive chronic kidney disease with stage 1 through stage 4 chronic kidney disease, or unspecified chronic kidney disease: Secondary | ICD-10-CM | POA: Diagnosis not present

## 2016-12-05 DIAGNOSIS — L97521 Non-pressure chronic ulcer of other part of left foot limited to breakdown of skin: Secondary | ICD-10-CM | POA: Diagnosis not present

## 2016-12-08 DIAGNOSIS — E1151 Type 2 diabetes mellitus with diabetic peripheral angiopathy without gangrene: Secondary | ICD-10-CM | POA: Diagnosis not present

## 2016-12-08 DIAGNOSIS — E11621 Type 2 diabetes mellitus with foot ulcer: Secondary | ICD-10-CM | POA: Diagnosis not present

## 2016-12-08 DIAGNOSIS — N189 Chronic kidney disease, unspecified: Secondary | ICD-10-CM | POA: Diagnosis not present

## 2016-12-08 DIAGNOSIS — E1122 Type 2 diabetes mellitus with diabetic chronic kidney disease: Secondary | ICD-10-CM | POA: Diagnosis not present

## 2016-12-08 DIAGNOSIS — I129 Hypertensive chronic kidney disease with stage 1 through stage 4 chronic kidney disease, or unspecified chronic kidney disease: Secondary | ICD-10-CM | POA: Diagnosis not present

## 2016-12-08 DIAGNOSIS — L97521 Non-pressure chronic ulcer of other part of left foot limited to breakdown of skin: Secondary | ICD-10-CM | POA: Diagnosis not present

## 2016-12-09 DIAGNOSIS — N189 Chronic kidney disease, unspecified: Secondary | ICD-10-CM | POA: Diagnosis not present

## 2016-12-09 DIAGNOSIS — E1151 Type 2 diabetes mellitus with diabetic peripheral angiopathy without gangrene: Secondary | ICD-10-CM | POA: Diagnosis not present

## 2016-12-09 DIAGNOSIS — E11621 Type 2 diabetes mellitus with foot ulcer: Secondary | ICD-10-CM | POA: Diagnosis not present

## 2016-12-09 DIAGNOSIS — I129 Hypertensive chronic kidney disease with stage 1 through stage 4 chronic kidney disease, or unspecified chronic kidney disease: Secondary | ICD-10-CM | POA: Diagnosis not present

## 2016-12-09 DIAGNOSIS — L97521 Non-pressure chronic ulcer of other part of left foot limited to breakdown of skin: Secondary | ICD-10-CM | POA: Diagnosis not present

## 2016-12-09 DIAGNOSIS — E1122 Type 2 diabetes mellitus with diabetic chronic kidney disease: Secondary | ICD-10-CM | POA: Diagnosis not present

## 2016-12-10 DIAGNOSIS — E1151 Type 2 diabetes mellitus with diabetic peripheral angiopathy without gangrene: Secondary | ICD-10-CM | POA: Diagnosis not present

## 2016-12-10 DIAGNOSIS — L97521 Non-pressure chronic ulcer of other part of left foot limited to breakdown of skin: Secondary | ICD-10-CM | POA: Diagnosis not present

## 2016-12-10 DIAGNOSIS — E11621 Type 2 diabetes mellitus with foot ulcer: Secondary | ICD-10-CM | POA: Diagnosis not present

## 2016-12-10 DIAGNOSIS — N189 Chronic kidney disease, unspecified: Secondary | ICD-10-CM | POA: Diagnosis not present

## 2016-12-10 DIAGNOSIS — E1122 Type 2 diabetes mellitus with diabetic chronic kidney disease: Secondary | ICD-10-CM | POA: Diagnosis not present

## 2016-12-10 DIAGNOSIS — I129 Hypertensive chronic kidney disease with stage 1 through stage 4 chronic kidney disease, or unspecified chronic kidney disease: Secondary | ICD-10-CM | POA: Diagnosis not present

## 2016-12-11 DIAGNOSIS — E11621 Type 2 diabetes mellitus with foot ulcer: Secondary | ICD-10-CM | POA: Diagnosis not present

## 2016-12-11 DIAGNOSIS — L97521 Non-pressure chronic ulcer of other part of left foot limited to breakdown of skin: Secondary | ICD-10-CM | POA: Diagnosis not present

## 2016-12-11 DIAGNOSIS — N189 Chronic kidney disease, unspecified: Secondary | ICD-10-CM | POA: Diagnosis not present

## 2016-12-11 DIAGNOSIS — E1122 Type 2 diabetes mellitus with diabetic chronic kidney disease: Secondary | ICD-10-CM | POA: Diagnosis not present

## 2016-12-11 DIAGNOSIS — I129 Hypertensive chronic kidney disease with stage 1 through stage 4 chronic kidney disease, or unspecified chronic kidney disease: Secondary | ICD-10-CM | POA: Diagnosis not present

## 2016-12-11 DIAGNOSIS — E1151 Type 2 diabetes mellitus with diabetic peripheral angiopathy without gangrene: Secondary | ICD-10-CM | POA: Diagnosis not present

## 2016-12-12 DIAGNOSIS — L97521 Non-pressure chronic ulcer of other part of left foot limited to breakdown of skin: Secondary | ICD-10-CM | POA: Diagnosis not present

## 2016-12-12 DIAGNOSIS — E11621 Type 2 diabetes mellitus with foot ulcer: Secondary | ICD-10-CM | POA: Diagnosis not present

## 2016-12-12 DIAGNOSIS — E1122 Type 2 diabetes mellitus with diabetic chronic kidney disease: Secondary | ICD-10-CM | POA: Diagnosis not present

## 2016-12-12 DIAGNOSIS — I129 Hypertensive chronic kidney disease with stage 1 through stage 4 chronic kidney disease, or unspecified chronic kidney disease: Secondary | ICD-10-CM | POA: Diagnosis not present

## 2016-12-12 DIAGNOSIS — N189 Chronic kidney disease, unspecified: Secondary | ICD-10-CM | POA: Diagnosis not present

## 2016-12-12 DIAGNOSIS — E1151 Type 2 diabetes mellitus with diabetic peripheral angiopathy without gangrene: Secondary | ICD-10-CM | POA: Diagnosis not present

## 2016-12-16 DIAGNOSIS — N189 Chronic kidney disease, unspecified: Secondary | ICD-10-CM | POA: Diagnosis not present

## 2016-12-16 DIAGNOSIS — E1151 Type 2 diabetes mellitus with diabetic peripheral angiopathy without gangrene: Secondary | ICD-10-CM | POA: Diagnosis not present

## 2016-12-16 DIAGNOSIS — E1122 Type 2 diabetes mellitus with diabetic chronic kidney disease: Secondary | ICD-10-CM | POA: Diagnosis not present

## 2016-12-16 DIAGNOSIS — L97521 Non-pressure chronic ulcer of other part of left foot limited to breakdown of skin: Secondary | ICD-10-CM | POA: Diagnosis not present

## 2016-12-16 DIAGNOSIS — E11621 Type 2 diabetes mellitus with foot ulcer: Secondary | ICD-10-CM | POA: Diagnosis not present

## 2016-12-16 DIAGNOSIS — I129 Hypertensive chronic kidney disease with stage 1 through stage 4 chronic kidney disease, or unspecified chronic kidney disease: Secondary | ICD-10-CM | POA: Diagnosis not present

## 2016-12-18 DIAGNOSIS — L97521 Non-pressure chronic ulcer of other part of left foot limited to breakdown of skin: Secondary | ICD-10-CM | POA: Diagnosis not present

## 2016-12-18 DIAGNOSIS — I129 Hypertensive chronic kidney disease with stage 1 through stage 4 chronic kidney disease, or unspecified chronic kidney disease: Secondary | ICD-10-CM | POA: Diagnosis not present

## 2016-12-18 DIAGNOSIS — N189 Chronic kidney disease, unspecified: Secondary | ICD-10-CM | POA: Diagnosis not present

## 2016-12-18 DIAGNOSIS — E1122 Type 2 diabetes mellitus with diabetic chronic kidney disease: Secondary | ICD-10-CM | POA: Diagnosis not present

## 2016-12-18 DIAGNOSIS — E1151 Type 2 diabetes mellitus with diabetic peripheral angiopathy without gangrene: Secondary | ICD-10-CM | POA: Diagnosis not present

## 2016-12-18 DIAGNOSIS — E11621 Type 2 diabetes mellitus with foot ulcer: Secondary | ICD-10-CM | POA: Diagnosis not present

## 2016-12-19 DIAGNOSIS — E11621 Type 2 diabetes mellitus with foot ulcer: Secondary | ICD-10-CM | POA: Diagnosis not present

## 2016-12-19 DIAGNOSIS — I129 Hypertensive chronic kidney disease with stage 1 through stage 4 chronic kidney disease, or unspecified chronic kidney disease: Secondary | ICD-10-CM | POA: Diagnosis not present

## 2016-12-19 DIAGNOSIS — E1151 Type 2 diabetes mellitus with diabetic peripheral angiopathy without gangrene: Secondary | ICD-10-CM | POA: Diagnosis not present

## 2016-12-19 DIAGNOSIS — L97521 Non-pressure chronic ulcer of other part of left foot limited to breakdown of skin: Secondary | ICD-10-CM | POA: Diagnosis not present

## 2016-12-19 DIAGNOSIS — N189 Chronic kidney disease, unspecified: Secondary | ICD-10-CM | POA: Diagnosis not present

## 2016-12-19 DIAGNOSIS — E1122 Type 2 diabetes mellitus with diabetic chronic kidney disease: Secondary | ICD-10-CM | POA: Diagnosis not present

## 2016-12-23 DIAGNOSIS — L97521 Non-pressure chronic ulcer of other part of left foot limited to breakdown of skin: Secondary | ICD-10-CM | POA: Diagnosis not present

## 2016-12-23 DIAGNOSIS — I129 Hypertensive chronic kidney disease with stage 1 through stage 4 chronic kidney disease, or unspecified chronic kidney disease: Secondary | ICD-10-CM | POA: Diagnosis not present

## 2016-12-23 DIAGNOSIS — E1151 Type 2 diabetes mellitus with diabetic peripheral angiopathy without gangrene: Secondary | ICD-10-CM | POA: Diagnosis not present

## 2016-12-23 DIAGNOSIS — E1122 Type 2 diabetes mellitus with diabetic chronic kidney disease: Secondary | ICD-10-CM | POA: Diagnosis not present

## 2016-12-23 DIAGNOSIS — E11621 Type 2 diabetes mellitus with foot ulcer: Secondary | ICD-10-CM | POA: Diagnosis not present

## 2016-12-23 DIAGNOSIS — N189 Chronic kidney disease, unspecified: Secondary | ICD-10-CM | POA: Diagnosis not present

## 2016-12-24 DIAGNOSIS — E1122 Type 2 diabetes mellitus with diabetic chronic kidney disease: Secondary | ICD-10-CM | POA: Diagnosis not present

## 2016-12-24 DIAGNOSIS — L97521 Non-pressure chronic ulcer of other part of left foot limited to breakdown of skin: Secondary | ICD-10-CM | POA: Diagnosis not present

## 2016-12-24 DIAGNOSIS — E1151 Type 2 diabetes mellitus with diabetic peripheral angiopathy without gangrene: Secondary | ICD-10-CM | POA: Diagnosis not present

## 2016-12-24 DIAGNOSIS — I129 Hypertensive chronic kidney disease with stage 1 through stage 4 chronic kidney disease, or unspecified chronic kidney disease: Secondary | ICD-10-CM | POA: Diagnosis not present

## 2016-12-24 DIAGNOSIS — N189 Chronic kidney disease, unspecified: Secondary | ICD-10-CM | POA: Diagnosis not present

## 2016-12-24 DIAGNOSIS — E11621 Type 2 diabetes mellitus with foot ulcer: Secondary | ICD-10-CM | POA: Diagnosis not present

## 2016-12-25 DIAGNOSIS — E1122 Type 2 diabetes mellitus with diabetic chronic kidney disease: Secondary | ICD-10-CM | POA: Diagnosis not present

## 2016-12-25 DIAGNOSIS — E1151 Type 2 diabetes mellitus with diabetic peripheral angiopathy without gangrene: Secondary | ICD-10-CM | POA: Diagnosis not present

## 2016-12-25 DIAGNOSIS — I129 Hypertensive chronic kidney disease with stage 1 through stage 4 chronic kidney disease, or unspecified chronic kidney disease: Secondary | ICD-10-CM | POA: Diagnosis not present

## 2016-12-25 DIAGNOSIS — L97521 Non-pressure chronic ulcer of other part of left foot limited to breakdown of skin: Secondary | ICD-10-CM | POA: Diagnosis not present

## 2016-12-25 DIAGNOSIS — N189 Chronic kidney disease, unspecified: Secondary | ICD-10-CM | POA: Diagnosis not present

## 2016-12-25 DIAGNOSIS — E11621 Type 2 diabetes mellitus with foot ulcer: Secondary | ICD-10-CM | POA: Diagnosis not present

## 2016-12-30 DIAGNOSIS — E1151 Type 2 diabetes mellitus with diabetic peripheral angiopathy without gangrene: Secondary | ICD-10-CM | POA: Diagnosis not present

## 2016-12-30 DIAGNOSIS — L97521 Non-pressure chronic ulcer of other part of left foot limited to breakdown of skin: Secondary | ICD-10-CM | POA: Diagnosis not present

## 2016-12-30 DIAGNOSIS — E1122 Type 2 diabetes mellitus with diabetic chronic kidney disease: Secondary | ICD-10-CM | POA: Diagnosis not present

## 2016-12-30 DIAGNOSIS — N189 Chronic kidney disease, unspecified: Secondary | ICD-10-CM | POA: Diagnosis not present

## 2016-12-30 DIAGNOSIS — E11621 Type 2 diabetes mellitus with foot ulcer: Secondary | ICD-10-CM | POA: Diagnosis not present

## 2016-12-30 DIAGNOSIS — I129 Hypertensive chronic kidney disease with stage 1 through stage 4 chronic kidney disease, or unspecified chronic kidney disease: Secondary | ICD-10-CM | POA: Diagnosis not present

## 2017-01-01 DIAGNOSIS — N189 Chronic kidney disease, unspecified: Secondary | ICD-10-CM | POA: Diagnosis not present

## 2017-01-01 DIAGNOSIS — I129 Hypertensive chronic kidney disease with stage 1 through stage 4 chronic kidney disease, or unspecified chronic kidney disease: Secondary | ICD-10-CM | POA: Diagnosis not present

## 2017-01-01 DIAGNOSIS — E11621 Type 2 diabetes mellitus with foot ulcer: Secondary | ICD-10-CM | POA: Diagnosis not present

## 2017-01-01 DIAGNOSIS — E1122 Type 2 diabetes mellitus with diabetic chronic kidney disease: Secondary | ICD-10-CM | POA: Diagnosis not present

## 2017-01-01 DIAGNOSIS — E1151 Type 2 diabetes mellitus with diabetic peripheral angiopathy without gangrene: Secondary | ICD-10-CM | POA: Diagnosis not present

## 2017-01-01 DIAGNOSIS — L97521 Non-pressure chronic ulcer of other part of left foot limited to breakdown of skin: Secondary | ICD-10-CM | POA: Diagnosis not present

## 2017-01-02 DIAGNOSIS — E1122 Type 2 diabetes mellitus with diabetic chronic kidney disease: Secondary | ICD-10-CM | POA: Diagnosis not present

## 2017-01-02 DIAGNOSIS — I129 Hypertensive chronic kidney disease with stage 1 through stage 4 chronic kidney disease, or unspecified chronic kidney disease: Secondary | ICD-10-CM | POA: Diagnosis not present

## 2017-01-02 DIAGNOSIS — L97521 Non-pressure chronic ulcer of other part of left foot limited to breakdown of skin: Secondary | ICD-10-CM | POA: Diagnosis not present

## 2017-01-02 DIAGNOSIS — E11621 Type 2 diabetes mellitus with foot ulcer: Secondary | ICD-10-CM | POA: Diagnosis not present

## 2017-01-02 DIAGNOSIS — N189 Chronic kidney disease, unspecified: Secondary | ICD-10-CM | POA: Diagnosis not present

## 2017-01-02 DIAGNOSIS — E1151 Type 2 diabetes mellitus with diabetic peripheral angiopathy without gangrene: Secondary | ICD-10-CM | POA: Diagnosis not present

## 2017-01-06 DIAGNOSIS — E11621 Type 2 diabetes mellitus with foot ulcer: Secondary | ICD-10-CM | POA: Diagnosis not present

## 2017-01-06 DIAGNOSIS — N189 Chronic kidney disease, unspecified: Secondary | ICD-10-CM | POA: Diagnosis not present

## 2017-01-06 DIAGNOSIS — L97521 Non-pressure chronic ulcer of other part of left foot limited to breakdown of skin: Secondary | ICD-10-CM | POA: Diagnosis not present

## 2017-01-06 DIAGNOSIS — E1151 Type 2 diabetes mellitus with diabetic peripheral angiopathy without gangrene: Secondary | ICD-10-CM | POA: Diagnosis not present

## 2017-01-06 DIAGNOSIS — I129 Hypertensive chronic kidney disease with stage 1 through stage 4 chronic kidney disease, or unspecified chronic kidney disease: Secondary | ICD-10-CM | POA: Diagnosis not present

## 2017-01-06 DIAGNOSIS — E1122 Type 2 diabetes mellitus with diabetic chronic kidney disease: Secondary | ICD-10-CM | POA: Diagnosis not present

## 2017-01-08 DIAGNOSIS — L97521 Non-pressure chronic ulcer of other part of left foot limited to breakdown of skin: Secondary | ICD-10-CM | POA: Diagnosis not present

## 2017-01-08 DIAGNOSIS — I129 Hypertensive chronic kidney disease with stage 1 through stage 4 chronic kidney disease, or unspecified chronic kidney disease: Secondary | ICD-10-CM | POA: Diagnosis not present

## 2017-01-08 DIAGNOSIS — N189 Chronic kidney disease, unspecified: Secondary | ICD-10-CM | POA: Diagnosis not present

## 2017-01-08 DIAGNOSIS — E11621 Type 2 diabetes mellitus with foot ulcer: Secondary | ICD-10-CM | POA: Diagnosis not present

## 2017-01-08 DIAGNOSIS — E1122 Type 2 diabetes mellitus with diabetic chronic kidney disease: Secondary | ICD-10-CM | POA: Diagnosis not present

## 2017-01-08 DIAGNOSIS — S80212A Abrasion, left knee, initial encounter: Secondary | ICD-10-CM | POA: Diagnosis not present

## 2017-01-08 DIAGNOSIS — L309 Dermatitis, unspecified: Secondary | ICD-10-CM | POA: Diagnosis not present

## 2017-01-08 DIAGNOSIS — E1151 Type 2 diabetes mellitus with diabetic peripheral angiopathy without gangrene: Secondary | ICD-10-CM | POA: Diagnosis not present

## 2017-01-09 DIAGNOSIS — I129 Hypertensive chronic kidney disease with stage 1 through stage 4 chronic kidney disease, or unspecified chronic kidney disease: Secondary | ICD-10-CM | POA: Diagnosis not present

## 2017-01-09 DIAGNOSIS — N189 Chronic kidney disease, unspecified: Secondary | ICD-10-CM | POA: Diagnosis not present

## 2017-01-09 DIAGNOSIS — E1151 Type 2 diabetes mellitus with diabetic peripheral angiopathy without gangrene: Secondary | ICD-10-CM | POA: Diagnosis not present

## 2017-01-09 DIAGNOSIS — E1122 Type 2 diabetes mellitus with diabetic chronic kidney disease: Secondary | ICD-10-CM | POA: Diagnosis not present

## 2017-01-09 DIAGNOSIS — E11621 Type 2 diabetes mellitus with foot ulcer: Secondary | ICD-10-CM | POA: Diagnosis not present

## 2017-01-09 DIAGNOSIS — L97521 Non-pressure chronic ulcer of other part of left foot limited to breakdown of skin: Secondary | ICD-10-CM | POA: Diagnosis not present

## 2017-01-13 DIAGNOSIS — I129 Hypertensive chronic kidney disease with stage 1 through stage 4 chronic kidney disease, or unspecified chronic kidney disease: Secondary | ICD-10-CM | POA: Diagnosis not present

## 2017-01-13 DIAGNOSIS — E1122 Type 2 diabetes mellitus with diabetic chronic kidney disease: Secondary | ICD-10-CM | POA: Diagnosis not present

## 2017-01-13 DIAGNOSIS — E11621 Type 2 diabetes mellitus with foot ulcer: Secondary | ICD-10-CM | POA: Diagnosis not present

## 2017-01-13 DIAGNOSIS — N189 Chronic kidney disease, unspecified: Secondary | ICD-10-CM | POA: Diagnosis not present

## 2017-01-13 DIAGNOSIS — L97522 Non-pressure chronic ulcer of other part of left foot with fat layer exposed: Secondary | ICD-10-CM | POA: Diagnosis not present

## 2017-01-13 DIAGNOSIS — E1151 Type 2 diabetes mellitus with diabetic peripheral angiopathy without gangrene: Secondary | ICD-10-CM | POA: Diagnosis not present

## 2017-01-13 DIAGNOSIS — L97521 Non-pressure chronic ulcer of other part of left foot limited to breakdown of skin: Secondary | ICD-10-CM | POA: Diagnosis not present

## 2017-01-14 DIAGNOSIS — E1151 Type 2 diabetes mellitus with diabetic peripheral angiopathy without gangrene: Secondary | ICD-10-CM | POA: Diagnosis not present

## 2017-01-14 DIAGNOSIS — S80911D Unspecified superficial injury of right knee, subsequent encounter: Secondary | ICD-10-CM | POA: Diagnosis not present

## 2017-01-14 DIAGNOSIS — L89892 Pressure ulcer of other site, stage 2: Secondary | ICD-10-CM | POA: Diagnosis not present

## 2017-01-14 DIAGNOSIS — I129 Hypertensive chronic kidney disease with stage 1 through stage 4 chronic kidney disease, or unspecified chronic kidney disease: Secondary | ICD-10-CM | POA: Diagnosis not present

## 2017-01-14 DIAGNOSIS — E1122 Type 2 diabetes mellitus with diabetic chronic kidney disease: Secondary | ICD-10-CM | POA: Diagnosis not present

## 2017-01-14 DIAGNOSIS — S80912D Unspecified superficial injury of left knee, subsequent encounter: Secondary | ICD-10-CM | POA: Diagnosis not present

## 2017-01-16 DIAGNOSIS — E1151 Type 2 diabetes mellitus with diabetic peripheral angiopathy without gangrene: Secondary | ICD-10-CM | POA: Diagnosis not present

## 2017-01-16 DIAGNOSIS — S80911D Unspecified superficial injury of right knee, subsequent encounter: Secondary | ICD-10-CM | POA: Diagnosis not present

## 2017-01-16 DIAGNOSIS — I129 Hypertensive chronic kidney disease with stage 1 through stage 4 chronic kidney disease, or unspecified chronic kidney disease: Secondary | ICD-10-CM | POA: Diagnosis not present

## 2017-01-16 DIAGNOSIS — E1122 Type 2 diabetes mellitus with diabetic chronic kidney disease: Secondary | ICD-10-CM | POA: Diagnosis not present

## 2017-01-16 DIAGNOSIS — L89892 Pressure ulcer of other site, stage 2: Secondary | ICD-10-CM | POA: Diagnosis not present

## 2017-01-16 DIAGNOSIS — S80912D Unspecified superficial injury of left knee, subsequent encounter: Secondary | ICD-10-CM | POA: Diagnosis not present

## 2017-01-20 DIAGNOSIS — S80912D Unspecified superficial injury of left knee, subsequent encounter: Secondary | ICD-10-CM | POA: Diagnosis not present

## 2017-01-20 DIAGNOSIS — S80911D Unspecified superficial injury of right knee, subsequent encounter: Secondary | ICD-10-CM | POA: Diagnosis not present

## 2017-01-20 DIAGNOSIS — L89892 Pressure ulcer of other site, stage 2: Secondary | ICD-10-CM | POA: Diagnosis not present

## 2017-01-20 DIAGNOSIS — I129 Hypertensive chronic kidney disease with stage 1 through stage 4 chronic kidney disease, or unspecified chronic kidney disease: Secondary | ICD-10-CM | POA: Diagnosis not present

## 2017-01-20 DIAGNOSIS — E1122 Type 2 diabetes mellitus with diabetic chronic kidney disease: Secondary | ICD-10-CM | POA: Diagnosis not present

## 2017-01-20 DIAGNOSIS — E1151 Type 2 diabetes mellitus with diabetic peripheral angiopathy without gangrene: Secondary | ICD-10-CM | POA: Diagnosis not present

## 2017-01-22 DIAGNOSIS — S80911D Unspecified superficial injury of right knee, subsequent encounter: Secondary | ICD-10-CM | POA: Diagnosis not present

## 2017-01-22 DIAGNOSIS — E1122 Type 2 diabetes mellitus with diabetic chronic kidney disease: Secondary | ICD-10-CM | POA: Diagnosis not present

## 2017-01-22 DIAGNOSIS — S80912D Unspecified superficial injury of left knee, subsequent encounter: Secondary | ICD-10-CM | POA: Diagnosis not present

## 2017-01-22 DIAGNOSIS — L89892 Pressure ulcer of other site, stage 2: Secondary | ICD-10-CM | POA: Diagnosis not present

## 2017-01-22 DIAGNOSIS — I129 Hypertensive chronic kidney disease with stage 1 through stage 4 chronic kidney disease, or unspecified chronic kidney disease: Secondary | ICD-10-CM | POA: Diagnosis not present

## 2017-01-22 DIAGNOSIS — E1151 Type 2 diabetes mellitus with diabetic peripheral angiopathy without gangrene: Secondary | ICD-10-CM | POA: Diagnosis not present

## 2017-01-23 DIAGNOSIS — L89892 Pressure ulcer of other site, stage 2: Secondary | ICD-10-CM | POA: Diagnosis not present

## 2017-01-23 DIAGNOSIS — S80912D Unspecified superficial injury of left knee, subsequent encounter: Secondary | ICD-10-CM | POA: Diagnosis not present

## 2017-01-23 DIAGNOSIS — E1151 Type 2 diabetes mellitus with diabetic peripheral angiopathy without gangrene: Secondary | ICD-10-CM | POA: Diagnosis not present

## 2017-01-23 DIAGNOSIS — E1122 Type 2 diabetes mellitus with diabetic chronic kidney disease: Secondary | ICD-10-CM | POA: Diagnosis not present

## 2017-01-23 DIAGNOSIS — I129 Hypertensive chronic kidney disease with stage 1 through stage 4 chronic kidney disease, or unspecified chronic kidney disease: Secondary | ICD-10-CM | POA: Diagnosis not present

## 2017-01-23 DIAGNOSIS — S80911D Unspecified superficial injury of right knee, subsequent encounter: Secondary | ICD-10-CM | POA: Diagnosis not present

## 2017-01-27 DIAGNOSIS — S80912D Unspecified superficial injury of left knee, subsequent encounter: Secondary | ICD-10-CM | POA: Diagnosis not present

## 2017-01-27 DIAGNOSIS — E1122 Type 2 diabetes mellitus with diabetic chronic kidney disease: Secondary | ICD-10-CM | POA: Diagnosis not present

## 2017-01-27 DIAGNOSIS — I129 Hypertensive chronic kidney disease with stage 1 through stage 4 chronic kidney disease, or unspecified chronic kidney disease: Secondary | ICD-10-CM | POA: Diagnosis not present

## 2017-01-27 DIAGNOSIS — S80911D Unspecified superficial injury of right knee, subsequent encounter: Secondary | ICD-10-CM | POA: Diagnosis not present

## 2017-01-27 DIAGNOSIS — E1151 Type 2 diabetes mellitus with diabetic peripheral angiopathy without gangrene: Secondary | ICD-10-CM | POA: Diagnosis not present

## 2017-01-27 DIAGNOSIS — L89892 Pressure ulcer of other site, stage 2: Secondary | ICD-10-CM | POA: Diagnosis not present

## 2017-01-29 DIAGNOSIS — E1122 Type 2 diabetes mellitus with diabetic chronic kidney disease: Secondary | ICD-10-CM | POA: Diagnosis not present

## 2017-01-29 DIAGNOSIS — I129 Hypertensive chronic kidney disease with stage 1 through stage 4 chronic kidney disease, or unspecified chronic kidney disease: Secondary | ICD-10-CM | POA: Diagnosis not present

## 2017-01-29 DIAGNOSIS — E1151 Type 2 diabetes mellitus with diabetic peripheral angiopathy without gangrene: Secondary | ICD-10-CM | POA: Diagnosis not present

## 2017-01-29 DIAGNOSIS — S80912D Unspecified superficial injury of left knee, subsequent encounter: Secondary | ICD-10-CM | POA: Diagnosis not present

## 2017-01-29 DIAGNOSIS — L89892 Pressure ulcer of other site, stage 2: Secondary | ICD-10-CM | POA: Diagnosis not present

## 2017-01-29 DIAGNOSIS — S80911D Unspecified superficial injury of right knee, subsequent encounter: Secondary | ICD-10-CM | POA: Diagnosis not present

## 2017-02-03 DIAGNOSIS — S80911D Unspecified superficial injury of right knee, subsequent encounter: Secondary | ICD-10-CM | POA: Diagnosis not present

## 2017-02-03 DIAGNOSIS — E1151 Type 2 diabetes mellitus with diabetic peripheral angiopathy without gangrene: Secondary | ICD-10-CM | POA: Diagnosis not present

## 2017-02-03 DIAGNOSIS — S80912D Unspecified superficial injury of left knee, subsequent encounter: Secondary | ICD-10-CM | POA: Diagnosis not present

## 2017-02-03 DIAGNOSIS — E1122 Type 2 diabetes mellitus with diabetic chronic kidney disease: Secondary | ICD-10-CM | POA: Diagnosis not present

## 2017-02-03 DIAGNOSIS — I129 Hypertensive chronic kidney disease with stage 1 through stage 4 chronic kidney disease, or unspecified chronic kidney disease: Secondary | ICD-10-CM | POA: Diagnosis not present

## 2017-02-03 DIAGNOSIS — L89892 Pressure ulcer of other site, stage 2: Secondary | ICD-10-CM | POA: Diagnosis not present

## 2017-02-04 DIAGNOSIS — E1151 Type 2 diabetes mellitus with diabetic peripheral angiopathy without gangrene: Secondary | ICD-10-CM | POA: Diagnosis not present

## 2017-02-04 DIAGNOSIS — E1122 Type 2 diabetes mellitus with diabetic chronic kidney disease: Secondary | ICD-10-CM | POA: Diagnosis not present

## 2017-02-04 DIAGNOSIS — S80912D Unspecified superficial injury of left knee, subsequent encounter: Secondary | ICD-10-CM | POA: Diagnosis not present

## 2017-02-04 DIAGNOSIS — I129 Hypertensive chronic kidney disease with stage 1 through stage 4 chronic kidney disease, or unspecified chronic kidney disease: Secondary | ICD-10-CM | POA: Diagnosis not present

## 2017-02-04 DIAGNOSIS — S80911D Unspecified superficial injury of right knee, subsequent encounter: Secondary | ICD-10-CM | POA: Diagnosis not present

## 2017-02-04 DIAGNOSIS — L89892 Pressure ulcer of other site, stage 2: Secondary | ICD-10-CM | POA: Diagnosis not present

## 2017-02-08 DIAGNOSIS — E1140 Type 2 diabetes mellitus with diabetic neuropathy, unspecified: Secondary | ICD-10-CM | POA: Diagnosis not present

## 2017-02-08 DIAGNOSIS — E119 Type 2 diabetes mellitus without complications: Secondary | ICD-10-CM | POA: Diagnosis not present

## 2017-02-08 DIAGNOSIS — Z8631 Personal history of diabetic foot ulcer: Secondary | ICD-10-CM | POA: Diagnosis not present

## 2017-02-08 DIAGNOSIS — L84 Corns and callosities: Secondary | ICD-10-CM | POA: Diagnosis not present

## 2017-02-08 DIAGNOSIS — E1159 Type 2 diabetes mellitus with other circulatory complications: Secondary | ICD-10-CM | POA: Diagnosis not present

## 2017-02-12 DIAGNOSIS — S80912D Unspecified superficial injury of left knee, subsequent encounter: Secondary | ICD-10-CM | POA: Diagnosis not present

## 2017-02-12 DIAGNOSIS — L89892 Pressure ulcer of other site, stage 2: Secondary | ICD-10-CM | POA: Diagnosis not present

## 2017-02-12 DIAGNOSIS — E1122 Type 2 diabetes mellitus with diabetic chronic kidney disease: Secondary | ICD-10-CM | POA: Diagnosis not present

## 2017-02-12 DIAGNOSIS — S80911D Unspecified superficial injury of right knee, subsequent encounter: Secondary | ICD-10-CM | POA: Diagnosis not present

## 2017-02-12 DIAGNOSIS — I129 Hypertensive chronic kidney disease with stage 1 through stage 4 chronic kidney disease, or unspecified chronic kidney disease: Secondary | ICD-10-CM | POA: Diagnosis not present

## 2017-02-12 DIAGNOSIS — E1151 Type 2 diabetes mellitus with diabetic peripheral angiopathy without gangrene: Secondary | ICD-10-CM | POA: Diagnosis not present

## 2017-02-17 DIAGNOSIS — E1122 Type 2 diabetes mellitus with diabetic chronic kidney disease: Secondary | ICD-10-CM | POA: Diagnosis not present

## 2017-02-17 DIAGNOSIS — E1151 Type 2 diabetes mellitus with diabetic peripheral angiopathy without gangrene: Secondary | ICD-10-CM | POA: Diagnosis not present

## 2017-02-17 DIAGNOSIS — S80912D Unspecified superficial injury of left knee, subsequent encounter: Secondary | ICD-10-CM | POA: Diagnosis not present

## 2017-02-17 DIAGNOSIS — S80911D Unspecified superficial injury of right knee, subsequent encounter: Secondary | ICD-10-CM | POA: Diagnosis not present

## 2017-02-17 DIAGNOSIS — I129 Hypertensive chronic kidney disease with stage 1 through stage 4 chronic kidney disease, or unspecified chronic kidney disease: Secondary | ICD-10-CM | POA: Diagnosis not present

## 2017-02-17 DIAGNOSIS — L89892 Pressure ulcer of other site, stage 2: Secondary | ICD-10-CM | POA: Diagnosis not present

## 2017-02-20 DIAGNOSIS — S80912D Unspecified superficial injury of left knee, subsequent encounter: Secondary | ICD-10-CM | POA: Diagnosis not present

## 2017-02-20 DIAGNOSIS — S80911D Unspecified superficial injury of right knee, subsequent encounter: Secondary | ICD-10-CM | POA: Diagnosis not present

## 2017-02-20 DIAGNOSIS — I129 Hypertensive chronic kidney disease with stage 1 through stage 4 chronic kidney disease, or unspecified chronic kidney disease: Secondary | ICD-10-CM | POA: Diagnosis not present

## 2017-02-20 DIAGNOSIS — E1122 Type 2 diabetes mellitus with diabetic chronic kidney disease: Secondary | ICD-10-CM | POA: Diagnosis not present

## 2017-02-20 DIAGNOSIS — E1151 Type 2 diabetes mellitus with diabetic peripheral angiopathy without gangrene: Secondary | ICD-10-CM | POA: Diagnosis not present

## 2017-02-20 DIAGNOSIS — L89892 Pressure ulcer of other site, stage 2: Secondary | ICD-10-CM | POA: Diagnosis not present

## 2017-02-24 DIAGNOSIS — L97522 Non-pressure chronic ulcer of other part of left foot with fat layer exposed: Secondary | ICD-10-CM | POA: Diagnosis not present

## 2017-02-24 DIAGNOSIS — M2041 Other hammer toe(s) (acquired), right foot: Secondary | ICD-10-CM | POA: Diagnosis not present

## 2017-02-24 DIAGNOSIS — M2042 Other hammer toe(s) (acquired), left foot: Secondary | ICD-10-CM | POA: Diagnosis not present

## 2017-03-03 DIAGNOSIS — E1122 Type 2 diabetes mellitus with diabetic chronic kidney disease: Secondary | ICD-10-CM | POA: Diagnosis not present

## 2017-03-03 DIAGNOSIS — L89892 Pressure ulcer of other site, stage 2: Secondary | ICD-10-CM | POA: Diagnosis not present

## 2017-03-03 DIAGNOSIS — I129 Hypertensive chronic kidney disease with stage 1 through stage 4 chronic kidney disease, or unspecified chronic kidney disease: Secondary | ICD-10-CM | POA: Diagnosis not present

## 2017-03-03 DIAGNOSIS — E1151 Type 2 diabetes mellitus with diabetic peripheral angiopathy without gangrene: Secondary | ICD-10-CM | POA: Diagnosis not present

## 2017-03-03 DIAGNOSIS — S80912D Unspecified superficial injury of left knee, subsequent encounter: Secondary | ICD-10-CM | POA: Diagnosis not present

## 2017-03-03 DIAGNOSIS — S80911D Unspecified superficial injury of right knee, subsequent encounter: Secondary | ICD-10-CM | POA: Diagnosis not present

## 2017-03-10 DIAGNOSIS — E1122 Type 2 diabetes mellitus with diabetic chronic kidney disease: Secondary | ICD-10-CM | POA: Diagnosis not present

## 2017-03-10 DIAGNOSIS — E1151 Type 2 diabetes mellitus with diabetic peripheral angiopathy without gangrene: Secondary | ICD-10-CM | POA: Diagnosis not present

## 2017-03-10 DIAGNOSIS — I129 Hypertensive chronic kidney disease with stage 1 through stage 4 chronic kidney disease, or unspecified chronic kidney disease: Secondary | ICD-10-CM | POA: Diagnosis not present

## 2017-03-10 DIAGNOSIS — S80912D Unspecified superficial injury of left knee, subsequent encounter: Secondary | ICD-10-CM | POA: Diagnosis not present

## 2017-03-10 DIAGNOSIS — S80911D Unspecified superficial injury of right knee, subsequent encounter: Secondary | ICD-10-CM | POA: Diagnosis not present

## 2017-03-10 DIAGNOSIS — L89892 Pressure ulcer of other site, stage 2: Secondary | ICD-10-CM | POA: Diagnosis not present

## 2017-03-12 DIAGNOSIS — G2581 Restless legs syndrome: Secondary | ICD-10-CM | POA: Diagnosis not present

## 2017-03-12 DIAGNOSIS — G629 Polyneuropathy, unspecified: Secondary | ICD-10-CM | POA: Diagnosis not present

## 2017-03-12 DIAGNOSIS — E1149 Type 2 diabetes mellitus with other diabetic neurological complication: Secondary | ICD-10-CM | POA: Diagnosis not present

## 2017-03-12 DIAGNOSIS — I10 Essential (primary) hypertension: Secondary | ICD-10-CM | POA: Diagnosis not present

## 2017-03-12 DIAGNOSIS — Z79899 Other long term (current) drug therapy: Secondary | ICD-10-CM | POA: Diagnosis not present

## 2017-03-12 DIAGNOSIS — N189 Chronic kidney disease, unspecified: Secondary | ICD-10-CM | POA: Diagnosis not present

## 2017-03-12 DIAGNOSIS — E785 Hyperlipidemia, unspecified: Secondary | ICD-10-CM | POA: Diagnosis not present

## 2017-03-12 DIAGNOSIS — K219 Gastro-esophageal reflux disease without esophagitis: Secondary | ICD-10-CM | POA: Diagnosis not present

## 2017-03-12 DIAGNOSIS — L98499 Non-pressure chronic ulcer of skin of other sites with unspecified severity: Secondary | ICD-10-CM | POA: Diagnosis not present

## 2017-03-15 DIAGNOSIS — L97529 Non-pressure chronic ulcer of other part of left foot with unspecified severity: Secondary | ICD-10-CM | POA: Diagnosis not present

## 2017-03-15 DIAGNOSIS — E1151 Type 2 diabetes mellitus with diabetic peripheral angiopathy without gangrene: Secondary | ICD-10-CM | POA: Diagnosis not present

## 2017-03-15 DIAGNOSIS — N189 Chronic kidney disease, unspecified: Secondary | ICD-10-CM | POA: Diagnosis not present

## 2017-03-15 DIAGNOSIS — E11621 Type 2 diabetes mellitus with foot ulcer: Secondary | ICD-10-CM | POA: Diagnosis not present

## 2017-03-15 DIAGNOSIS — I129 Hypertensive chronic kidney disease with stage 1 through stage 4 chronic kidney disease, or unspecified chronic kidney disease: Secondary | ICD-10-CM | POA: Diagnosis not present

## 2017-03-15 DIAGNOSIS — E1122 Type 2 diabetes mellitus with diabetic chronic kidney disease: Secondary | ICD-10-CM | POA: Diagnosis not present

## 2017-03-19 DIAGNOSIS — E1151 Type 2 diabetes mellitus with diabetic peripheral angiopathy without gangrene: Secondary | ICD-10-CM | POA: Diagnosis not present

## 2017-03-19 DIAGNOSIS — I129 Hypertensive chronic kidney disease with stage 1 through stage 4 chronic kidney disease, or unspecified chronic kidney disease: Secondary | ICD-10-CM | POA: Diagnosis not present

## 2017-03-19 DIAGNOSIS — E1122 Type 2 diabetes mellitus with diabetic chronic kidney disease: Secondary | ICD-10-CM | POA: Diagnosis not present

## 2017-03-19 DIAGNOSIS — E11621 Type 2 diabetes mellitus with foot ulcer: Secondary | ICD-10-CM | POA: Diagnosis not present

## 2017-03-19 DIAGNOSIS — L97529 Non-pressure chronic ulcer of other part of left foot with unspecified severity: Secondary | ICD-10-CM | POA: Diagnosis not present

## 2017-03-19 DIAGNOSIS — N189 Chronic kidney disease, unspecified: Secondary | ICD-10-CM | POA: Diagnosis not present

## 2017-03-24 DIAGNOSIS — L97522 Non-pressure chronic ulcer of other part of left foot with fat layer exposed: Secondary | ICD-10-CM | POA: Diagnosis not present

## 2017-03-24 DIAGNOSIS — L97822 Non-pressure chronic ulcer of other part of left lower leg with fat layer exposed: Secondary | ICD-10-CM | POA: Diagnosis not present

## 2017-03-24 DIAGNOSIS — L03116 Cellulitis of left lower limb: Secondary | ICD-10-CM | POA: Diagnosis not present

## 2017-03-26 DIAGNOSIS — I129 Hypertensive chronic kidney disease with stage 1 through stage 4 chronic kidney disease, or unspecified chronic kidney disease: Secondary | ICD-10-CM | POA: Diagnosis not present

## 2017-03-26 DIAGNOSIS — E11621 Type 2 diabetes mellitus with foot ulcer: Secondary | ICD-10-CM | POA: Diagnosis not present

## 2017-03-26 DIAGNOSIS — L97529 Non-pressure chronic ulcer of other part of left foot with unspecified severity: Secondary | ICD-10-CM | POA: Diagnosis not present

## 2017-03-26 DIAGNOSIS — E1151 Type 2 diabetes mellitus with diabetic peripheral angiopathy without gangrene: Secondary | ICD-10-CM | POA: Diagnosis not present

## 2017-03-26 DIAGNOSIS — N189 Chronic kidney disease, unspecified: Secondary | ICD-10-CM | POA: Diagnosis not present

## 2017-03-26 DIAGNOSIS — E1122 Type 2 diabetes mellitus with diabetic chronic kidney disease: Secondary | ICD-10-CM | POA: Diagnosis not present

## 2017-03-31 DIAGNOSIS — E11621 Type 2 diabetes mellitus with foot ulcer: Secondary | ICD-10-CM | POA: Diagnosis not present

## 2017-03-31 DIAGNOSIS — I129 Hypertensive chronic kidney disease with stage 1 through stage 4 chronic kidney disease, or unspecified chronic kidney disease: Secondary | ICD-10-CM | POA: Diagnosis not present

## 2017-03-31 DIAGNOSIS — E1151 Type 2 diabetes mellitus with diabetic peripheral angiopathy without gangrene: Secondary | ICD-10-CM | POA: Diagnosis not present

## 2017-03-31 DIAGNOSIS — L97529 Non-pressure chronic ulcer of other part of left foot with unspecified severity: Secondary | ICD-10-CM | POA: Diagnosis not present

## 2017-03-31 DIAGNOSIS — N189 Chronic kidney disease, unspecified: Secondary | ICD-10-CM | POA: Diagnosis not present

## 2017-03-31 DIAGNOSIS — E1122 Type 2 diabetes mellitus with diabetic chronic kidney disease: Secondary | ICD-10-CM | POA: Diagnosis not present

## 2017-04-01 ENCOUNTER — Other Ambulatory Visit: Payer: Self-pay | Admitting: Cardiology

## 2017-04-07 DIAGNOSIS — I129 Hypertensive chronic kidney disease with stage 1 through stage 4 chronic kidney disease, or unspecified chronic kidney disease: Secondary | ICD-10-CM | POA: Diagnosis not present

## 2017-04-07 DIAGNOSIS — E1122 Type 2 diabetes mellitus with diabetic chronic kidney disease: Secondary | ICD-10-CM | POA: Diagnosis not present

## 2017-04-07 DIAGNOSIS — L97529 Non-pressure chronic ulcer of other part of left foot with unspecified severity: Secondary | ICD-10-CM | POA: Diagnosis not present

## 2017-04-07 DIAGNOSIS — E1151 Type 2 diabetes mellitus with diabetic peripheral angiopathy without gangrene: Secondary | ICD-10-CM | POA: Diagnosis not present

## 2017-04-07 DIAGNOSIS — E11621 Type 2 diabetes mellitus with foot ulcer: Secondary | ICD-10-CM | POA: Diagnosis not present

## 2017-04-07 DIAGNOSIS — N189 Chronic kidney disease, unspecified: Secondary | ICD-10-CM | POA: Diagnosis not present

## 2017-04-10 DIAGNOSIS — L97529 Non-pressure chronic ulcer of other part of left foot with unspecified severity: Secondary | ICD-10-CM | POA: Diagnosis not present

## 2017-04-10 DIAGNOSIS — I129 Hypertensive chronic kidney disease with stage 1 through stage 4 chronic kidney disease, or unspecified chronic kidney disease: Secondary | ICD-10-CM | POA: Diagnosis not present

## 2017-04-10 DIAGNOSIS — E1122 Type 2 diabetes mellitus with diabetic chronic kidney disease: Secondary | ICD-10-CM | POA: Diagnosis not present

## 2017-04-10 DIAGNOSIS — E11621 Type 2 diabetes mellitus with foot ulcer: Secondary | ICD-10-CM | POA: Diagnosis not present

## 2017-04-10 DIAGNOSIS — E1151 Type 2 diabetes mellitus with diabetic peripheral angiopathy without gangrene: Secondary | ICD-10-CM | POA: Diagnosis not present

## 2017-04-10 DIAGNOSIS — N189 Chronic kidney disease, unspecified: Secondary | ICD-10-CM | POA: Diagnosis not present

## 2017-04-22 DIAGNOSIS — L02619 Cutaneous abscess of unspecified foot: Secondary | ICD-10-CM | POA: Diagnosis not present

## 2017-04-25 DIAGNOSIS — E1140 Type 2 diabetes mellitus with diabetic neuropathy, unspecified: Secondary | ICD-10-CM | POA: Diagnosis not present

## 2017-04-25 DIAGNOSIS — N189 Chronic kidney disease, unspecified: Secondary | ICD-10-CM | POA: Diagnosis not present

## 2017-04-25 DIAGNOSIS — E1151 Type 2 diabetes mellitus with diabetic peripheral angiopathy without gangrene: Secondary | ICD-10-CM | POA: Diagnosis not present

## 2017-04-25 DIAGNOSIS — I129 Hypertensive chronic kidney disease with stage 1 through stage 4 chronic kidney disease, or unspecified chronic kidney disease: Secondary | ICD-10-CM | POA: Diagnosis not present

## 2017-04-25 DIAGNOSIS — L02612 Cutaneous abscess of left foot: Secondary | ICD-10-CM | POA: Diagnosis not present

## 2017-04-25 DIAGNOSIS — E1122 Type 2 diabetes mellitus with diabetic chronic kidney disease: Secondary | ICD-10-CM | POA: Diagnosis not present

## 2017-04-28 DIAGNOSIS — M2042 Other hammer toe(s) (acquired), left foot: Secondary | ICD-10-CM | POA: Diagnosis not present

## 2017-04-28 DIAGNOSIS — L97522 Non-pressure chronic ulcer of other part of left foot with fat layer exposed: Secondary | ICD-10-CM | POA: Diagnosis not present

## 2017-04-29 DIAGNOSIS — I129 Hypertensive chronic kidney disease with stage 1 through stage 4 chronic kidney disease, or unspecified chronic kidney disease: Secondary | ICD-10-CM | POA: Diagnosis not present

## 2017-04-29 DIAGNOSIS — E1122 Type 2 diabetes mellitus with diabetic chronic kidney disease: Secondary | ICD-10-CM | POA: Diagnosis not present

## 2017-04-29 DIAGNOSIS — L02612 Cutaneous abscess of left foot: Secondary | ICD-10-CM | POA: Diagnosis not present

## 2017-04-29 DIAGNOSIS — N189 Chronic kidney disease, unspecified: Secondary | ICD-10-CM | POA: Diagnosis not present

## 2017-04-29 DIAGNOSIS — E1151 Type 2 diabetes mellitus with diabetic peripheral angiopathy without gangrene: Secondary | ICD-10-CM | POA: Diagnosis not present

## 2017-04-29 DIAGNOSIS — E1140 Type 2 diabetes mellitus with diabetic neuropathy, unspecified: Secondary | ICD-10-CM | POA: Diagnosis not present

## 2017-04-30 DIAGNOSIS — E1151 Type 2 diabetes mellitus with diabetic peripheral angiopathy without gangrene: Secondary | ICD-10-CM | POA: Diagnosis not present

## 2017-04-30 DIAGNOSIS — E1122 Type 2 diabetes mellitus with diabetic chronic kidney disease: Secondary | ICD-10-CM | POA: Diagnosis not present

## 2017-04-30 DIAGNOSIS — E1140 Type 2 diabetes mellitus with diabetic neuropathy, unspecified: Secondary | ICD-10-CM | POA: Diagnosis not present

## 2017-04-30 DIAGNOSIS — N189 Chronic kidney disease, unspecified: Secondary | ICD-10-CM | POA: Diagnosis not present

## 2017-04-30 DIAGNOSIS — I129 Hypertensive chronic kidney disease with stage 1 through stage 4 chronic kidney disease, or unspecified chronic kidney disease: Secondary | ICD-10-CM | POA: Diagnosis not present

## 2017-04-30 DIAGNOSIS — L02612 Cutaneous abscess of left foot: Secondary | ICD-10-CM | POA: Diagnosis not present

## 2017-05-05 DIAGNOSIS — L02612 Cutaneous abscess of left foot: Secondary | ICD-10-CM | POA: Diagnosis not present

## 2017-05-05 DIAGNOSIS — N189 Chronic kidney disease, unspecified: Secondary | ICD-10-CM | POA: Diagnosis not present

## 2017-05-05 DIAGNOSIS — E1122 Type 2 diabetes mellitus with diabetic chronic kidney disease: Secondary | ICD-10-CM | POA: Diagnosis not present

## 2017-05-05 DIAGNOSIS — E1140 Type 2 diabetes mellitus with diabetic neuropathy, unspecified: Secondary | ICD-10-CM | POA: Diagnosis not present

## 2017-05-05 DIAGNOSIS — E1151 Type 2 diabetes mellitus with diabetic peripheral angiopathy without gangrene: Secondary | ICD-10-CM | POA: Diagnosis not present

## 2017-05-05 DIAGNOSIS — I129 Hypertensive chronic kidney disease with stage 1 through stage 4 chronic kidney disease, or unspecified chronic kidney disease: Secondary | ICD-10-CM | POA: Diagnosis not present

## 2017-05-08 DIAGNOSIS — L02612 Cutaneous abscess of left foot: Secondary | ICD-10-CM | POA: Diagnosis not present

## 2017-05-08 DIAGNOSIS — E1151 Type 2 diabetes mellitus with diabetic peripheral angiopathy without gangrene: Secondary | ICD-10-CM | POA: Diagnosis not present

## 2017-05-08 DIAGNOSIS — E1122 Type 2 diabetes mellitus with diabetic chronic kidney disease: Secondary | ICD-10-CM | POA: Diagnosis not present

## 2017-05-08 DIAGNOSIS — E1140 Type 2 diabetes mellitus with diabetic neuropathy, unspecified: Secondary | ICD-10-CM | POA: Diagnosis not present

## 2017-05-08 DIAGNOSIS — N189 Chronic kidney disease, unspecified: Secondary | ICD-10-CM | POA: Diagnosis not present

## 2017-05-08 DIAGNOSIS — I129 Hypertensive chronic kidney disease with stage 1 through stage 4 chronic kidney disease, or unspecified chronic kidney disease: Secondary | ICD-10-CM | POA: Diagnosis not present

## 2017-05-11 DIAGNOSIS — L02612 Cutaneous abscess of left foot: Secondary | ICD-10-CM | POA: Diagnosis not present

## 2017-05-11 DIAGNOSIS — E1122 Type 2 diabetes mellitus with diabetic chronic kidney disease: Secondary | ICD-10-CM | POA: Diagnosis not present

## 2017-05-11 DIAGNOSIS — E1140 Type 2 diabetes mellitus with diabetic neuropathy, unspecified: Secondary | ICD-10-CM | POA: Diagnosis not present

## 2017-05-11 DIAGNOSIS — N189 Chronic kidney disease, unspecified: Secondary | ICD-10-CM | POA: Diagnosis not present

## 2017-05-11 DIAGNOSIS — I129 Hypertensive chronic kidney disease with stage 1 through stage 4 chronic kidney disease, or unspecified chronic kidney disease: Secondary | ICD-10-CM | POA: Diagnosis not present

## 2017-05-11 DIAGNOSIS — E1151 Type 2 diabetes mellitus with diabetic peripheral angiopathy without gangrene: Secondary | ICD-10-CM | POA: Diagnosis not present

## 2017-05-12 DIAGNOSIS — L97522 Non-pressure chronic ulcer of other part of left foot with fat layer exposed: Secondary | ICD-10-CM | POA: Diagnosis not present

## 2017-05-12 DIAGNOSIS — M2041 Other hammer toe(s) (acquired), right foot: Secondary | ICD-10-CM | POA: Diagnosis not present

## 2017-05-12 DIAGNOSIS — M2042 Other hammer toe(s) (acquired), left foot: Secondary | ICD-10-CM | POA: Diagnosis not present

## 2017-05-14 DIAGNOSIS — E1122 Type 2 diabetes mellitus with diabetic chronic kidney disease: Secondary | ICD-10-CM | POA: Diagnosis not present

## 2017-05-14 DIAGNOSIS — I129 Hypertensive chronic kidney disease with stage 1 through stage 4 chronic kidney disease, or unspecified chronic kidney disease: Secondary | ICD-10-CM | POA: Diagnosis not present

## 2017-05-14 DIAGNOSIS — E1151 Type 2 diabetes mellitus with diabetic peripheral angiopathy without gangrene: Secondary | ICD-10-CM | POA: Diagnosis not present

## 2017-05-14 DIAGNOSIS — N189 Chronic kidney disease, unspecified: Secondary | ICD-10-CM | POA: Diagnosis not present

## 2017-05-14 DIAGNOSIS — E1140 Type 2 diabetes mellitus with diabetic neuropathy, unspecified: Secondary | ICD-10-CM | POA: Diagnosis not present

## 2017-05-14 DIAGNOSIS — L02612 Cutaneous abscess of left foot: Secondary | ICD-10-CM | POA: Diagnosis not present

## 2017-05-18 DIAGNOSIS — E1151 Type 2 diabetes mellitus with diabetic peripheral angiopathy without gangrene: Secondary | ICD-10-CM | POA: Diagnosis not present

## 2017-05-18 DIAGNOSIS — I129 Hypertensive chronic kidney disease with stage 1 through stage 4 chronic kidney disease, or unspecified chronic kidney disease: Secondary | ICD-10-CM | POA: Diagnosis not present

## 2017-05-18 DIAGNOSIS — E1122 Type 2 diabetes mellitus with diabetic chronic kidney disease: Secondary | ICD-10-CM | POA: Diagnosis not present

## 2017-05-18 DIAGNOSIS — L02612 Cutaneous abscess of left foot: Secondary | ICD-10-CM | POA: Diagnosis not present

## 2017-05-18 DIAGNOSIS — E1140 Type 2 diabetes mellitus with diabetic neuropathy, unspecified: Secondary | ICD-10-CM | POA: Diagnosis not present

## 2017-05-18 DIAGNOSIS — N189 Chronic kidney disease, unspecified: Secondary | ICD-10-CM | POA: Diagnosis not present

## 2017-05-21 DIAGNOSIS — E1151 Type 2 diabetes mellitus with diabetic peripheral angiopathy without gangrene: Secondary | ICD-10-CM | POA: Diagnosis not present

## 2017-05-21 DIAGNOSIS — E1122 Type 2 diabetes mellitus with diabetic chronic kidney disease: Secondary | ICD-10-CM | POA: Diagnosis not present

## 2017-05-21 DIAGNOSIS — L02612 Cutaneous abscess of left foot: Secondary | ICD-10-CM | POA: Diagnosis not present

## 2017-05-21 DIAGNOSIS — I129 Hypertensive chronic kidney disease with stage 1 through stage 4 chronic kidney disease, or unspecified chronic kidney disease: Secondary | ICD-10-CM | POA: Diagnosis not present

## 2017-05-21 DIAGNOSIS — E1140 Type 2 diabetes mellitus with diabetic neuropathy, unspecified: Secondary | ICD-10-CM | POA: Diagnosis not present

## 2017-05-21 DIAGNOSIS — N189 Chronic kidney disease, unspecified: Secondary | ICD-10-CM | POA: Diagnosis not present

## 2017-05-25 DIAGNOSIS — L02612 Cutaneous abscess of left foot: Secondary | ICD-10-CM | POA: Diagnosis not present

## 2017-05-25 DIAGNOSIS — E1140 Type 2 diabetes mellitus with diabetic neuropathy, unspecified: Secondary | ICD-10-CM | POA: Diagnosis not present

## 2017-05-25 DIAGNOSIS — N189 Chronic kidney disease, unspecified: Secondary | ICD-10-CM | POA: Diagnosis not present

## 2017-05-25 DIAGNOSIS — E1151 Type 2 diabetes mellitus with diabetic peripheral angiopathy without gangrene: Secondary | ICD-10-CM | POA: Diagnosis not present

## 2017-05-25 DIAGNOSIS — E1122 Type 2 diabetes mellitus with diabetic chronic kidney disease: Secondary | ICD-10-CM | POA: Diagnosis not present

## 2017-05-25 DIAGNOSIS — I129 Hypertensive chronic kidney disease with stage 1 through stage 4 chronic kidney disease, or unspecified chronic kidney disease: Secondary | ICD-10-CM | POA: Diagnosis not present

## 2017-05-26 DIAGNOSIS — L97522 Non-pressure chronic ulcer of other part of left foot with fat layer exposed: Secondary | ICD-10-CM | POA: Diagnosis not present

## 2017-05-26 DIAGNOSIS — M2042 Other hammer toe(s) (acquired), left foot: Secondary | ICD-10-CM | POA: Diagnosis not present

## 2017-05-26 DIAGNOSIS — M2041 Other hammer toe(s) (acquired), right foot: Secondary | ICD-10-CM | POA: Diagnosis not present

## 2017-05-29 DIAGNOSIS — N189 Chronic kidney disease, unspecified: Secondary | ICD-10-CM | POA: Diagnosis not present

## 2017-05-29 DIAGNOSIS — E1140 Type 2 diabetes mellitus with diabetic neuropathy, unspecified: Secondary | ICD-10-CM | POA: Diagnosis not present

## 2017-05-29 DIAGNOSIS — E1122 Type 2 diabetes mellitus with diabetic chronic kidney disease: Secondary | ICD-10-CM | POA: Diagnosis not present

## 2017-05-29 DIAGNOSIS — L02612 Cutaneous abscess of left foot: Secondary | ICD-10-CM | POA: Diagnosis not present

## 2017-05-29 DIAGNOSIS — I129 Hypertensive chronic kidney disease with stage 1 through stage 4 chronic kidney disease, or unspecified chronic kidney disease: Secondary | ICD-10-CM | POA: Diagnosis not present

## 2017-05-29 DIAGNOSIS — E1151 Type 2 diabetes mellitus with diabetic peripheral angiopathy without gangrene: Secondary | ICD-10-CM | POA: Diagnosis not present

## 2017-06-01 DIAGNOSIS — E1122 Type 2 diabetes mellitus with diabetic chronic kidney disease: Secondary | ICD-10-CM | POA: Diagnosis not present

## 2017-06-01 DIAGNOSIS — L02612 Cutaneous abscess of left foot: Secondary | ICD-10-CM | POA: Diagnosis not present

## 2017-06-01 DIAGNOSIS — N189 Chronic kidney disease, unspecified: Secondary | ICD-10-CM | POA: Diagnosis not present

## 2017-06-01 DIAGNOSIS — E1151 Type 2 diabetes mellitus with diabetic peripheral angiopathy without gangrene: Secondary | ICD-10-CM | POA: Diagnosis not present

## 2017-06-01 DIAGNOSIS — E1140 Type 2 diabetes mellitus with diabetic neuropathy, unspecified: Secondary | ICD-10-CM | POA: Diagnosis not present

## 2017-06-01 DIAGNOSIS — I129 Hypertensive chronic kidney disease with stage 1 through stage 4 chronic kidney disease, or unspecified chronic kidney disease: Secondary | ICD-10-CM | POA: Diagnosis not present

## 2017-06-04 DIAGNOSIS — E1122 Type 2 diabetes mellitus with diabetic chronic kidney disease: Secondary | ICD-10-CM | POA: Diagnosis not present

## 2017-06-04 DIAGNOSIS — N189 Chronic kidney disease, unspecified: Secondary | ICD-10-CM | POA: Diagnosis not present

## 2017-06-04 DIAGNOSIS — E1140 Type 2 diabetes mellitus with diabetic neuropathy, unspecified: Secondary | ICD-10-CM | POA: Diagnosis not present

## 2017-06-04 DIAGNOSIS — I129 Hypertensive chronic kidney disease with stage 1 through stage 4 chronic kidney disease, or unspecified chronic kidney disease: Secondary | ICD-10-CM | POA: Diagnosis not present

## 2017-06-04 DIAGNOSIS — L02612 Cutaneous abscess of left foot: Secondary | ICD-10-CM | POA: Diagnosis not present

## 2017-06-04 DIAGNOSIS — E1151 Type 2 diabetes mellitus with diabetic peripheral angiopathy without gangrene: Secondary | ICD-10-CM | POA: Diagnosis not present

## 2017-06-08 DIAGNOSIS — L02612 Cutaneous abscess of left foot: Secondary | ICD-10-CM | POA: Diagnosis not present

## 2017-06-08 DIAGNOSIS — E1140 Type 2 diabetes mellitus with diabetic neuropathy, unspecified: Secondary | ICD-10-CM | POA: Diagnosis not present

## 2017-06-08 DIAGNOSIS — N189 Chronic kidney disease, unspecified: Secondary | ICD-10-CM | POA: Diagnosis not present

## 2017-06-08 DIAGNOSIS — E1151 Type 2 diabetes mellitus with diabetic peripheral angiopathy without gangrene: Secondary | ICD-10-CM | POA: Diagnosis not present

## 2017-06-08 DIAGNOSIS — E1122 Type 2 diabetes mellitus with diabetic chronic kidney disease: Secondary | ICD-10-CM | POA: Diagnosis not present

## 2017-06-08 DIAGNOSIS — I129 Hypertensive chronic kidney disease with stage 1 through stage 4 chronic kidney disease, or unspecified chronic kidney disease: Secondary | ICD-10-CM | POA: Diagnosis not present

## 2017-06-11 DIAGNOSIS — E1151 Type 2 diabetes mellitus with diabetic peripheral angiopathy without gangrene: Secondary | ICD-10-CM | POA: Diagnosis not present

## 2017-06-11 DIAGNOSIS — E1122 Type 2 diabetes mellitus with diabetic chronic kidney disease: Secondary | ICD-10-CM | POA: Diagnosis not present

## 2017-06-11 DIAGNOSIS — L02612 Cutaneous abscess of left foot: Secondary | ICD-10-CM | POA: Diagnosis not present

## 2017-06-11 DIAGNOSIS — N189 Chronic kidney disease, unspecified: Secondary | ICD-10-CM | POA: Diagnosis not present

## 2017-06-11 DIAGNOSIS — E1140 Type 2 diabetes mellitus with diabetic neuropathy, unspecified: Secondary | ICD-10-CM | POA: Diagnosis not present

## 2017-06-11 DIAGNOSIS — I129 Hypertensive chronic kidney disease with stage 1 through stage 4 chronic kidney disease, or unspecified chronic kidney disease: Secondary | ICD-10-CM | POA: Diagnosis not present

## 2017-06-15 DIAGNOSIS — E1122 Type 2 diabetes mellitus with diabetic chronic kidney disease: Secondary | ICD-10-CM | POA: Diagnosis not present

## 2017-06-15 DIAGNOSIS — L02612 Cutaneous abscess of left foot: Secondary | ICD-10-CM | POA: Diagnosis not present

## 2017-06-15 DIAGNOSIS — E1151 Type 2 diabetes mellitus with diabetic peripheral angiopathy without gangrene: Secondary | ICD-10-CM | POA: Diagnosis not present

## 2017-06-15 DIAGNOSIS — E1140 Type 2 diabetes mellitus with diabetic neuropathy, unspecified: Secondary | ICD-10-CM | POA: Diagnosis not present

## 2017-06-15 DIAGNOSIS — N189 Chronic kidney disease, unspecified: Secondary | ICD-10-CM | POA: Diagnosis not present

## 2017-06-15 DIAGNOSIS — I129 Hypertensive chronic kidney disease with stage 1 through stage 4 chronic kidney disease, or unspecified chronic kidney disease: Secondary | ICD-10-CM | POA: Diagnosis not present

## 2017-06-18 DIAGNOSIS — N189 Chronic kidney disease, unspecified: Secondary | ICD-10-CM | POA: Diagnosis not present

## 2017-06-18 DIAGNOSIS — E1140 Type 2 diabetes mellitus with diabetic neuropathy, unspecified: Secondary | ICD-10-CM | POA: Diagnosis not present

## 2017-06-18 DIAGNOSIS — I129 Hypertensive chronic kidney disease with stage 1 through stage 4 chronic kidney disease, or unspecified chronic kidney disease: Secondary | ICD-10-CM | POA: Diagnosis not present

## 2017-06-18 DIAGNOSIS — E1122 Type 2 diabetes mellitus with diabetic chronic kidney disease: Secondary | ICD-10-CM | POA: Diagnosis not present

## 2017-06-18 DIAGNOSIS — L02612 Cutaneous abscess of left foot: Secondary | ICD-10-CM | POA: Diagnosis not present

## 2017-06-18 DIAGNOSIS — E1151 Type 2 diabetes mellitus with diabetic peripheral angiopathy without gangrene: Secondary | ICD-10-CM | POA: Diagnosis not present

## 2017-06-22 DIAGNOSIS — E1151 Type 2 diabetes mellitus with diabetic peripheral angiopathy without gangrene: Secondary | ICD-10-CM | POA: Diagnosis not present

## 2017-06-22 DIAGNOSIS — L02612 Cutaneous abscess of left foot: Secondary | ICD-10-CM | POA: Diagnosis not present

## 2017-06-22 DIAGNOSIS — N189 Chronic kidney disease, unspecified: Secondary | ICD-10-CM | POA: Diagnosis not present

## 2017-06-22 DIAGNOSIS — E1122 Type 2 diabetes mellitus with diabetic chronic kidney disease: Secondary | ICD-10-CM | POA: Diagnosis not present

## 2017-06-22 DIAGNOSIS — E1140 Type 2 diabetes mellitus with diabetic neuropathy, unspecified: Secondary | ICD-10-CM | POA: Diagnosis not present

## 2017-06-22 DIAGNOSIS — I129 Hypertensive chronic kidney disease with stage 1 through stage 4 chronic kidney disease, or unspecified chronic kidney disease: Secondary | ICD-10-CM | POA: Diagnosis not present

## 2017-06-23 DIAGNOSIS — M25572 Pain in left ankle and joints of left foot: Secondary | ICD-10-CM | POA: Diagnosis not present

## 2017-06-23 DIAGNOSIS — L97522 Non-pressure chronic ulcer of other part of left foot with fat layer exposed: Secondary | ICD-10-CM | POA: Diagnosis not present

## 2017-06-23 DIAGNOSIS — M2042 Other hammer toe(s) (acquired), left foot: Secondary | ICD-10-CM | POA: Diagnosis not present

## 2017-06-23 DIAGNOSIS — M2041 Other hammer toe(s) (acquired), right foot: Secondary | ICD-10-CM | POA: Diagnosis not present

## 2017-06-24 DIAGNOSIS — Z48817 Encounter for surgical aftercare following surgery on the skin and subcutaneous tissue: Secondary | ICD-10-CM | POA: Diagnosis not present

## 2017-06-24 DIAGNOSIS — E1151 Type 2 diabetes mellitus with diabetic peripheral angiopathy without gangrene: Secondary | ICD-10-CM | POA: Diagnosis not present

## 2017-06-24 DIAGNOSIS — N189 Chronic kidney disease, unspecified: Secondary | ICD-10-CM | POA: Diagnosis not present

## 2017-06-24 DIAGNOSIS — E1142 Type 2 diabetes mellitus with diabetic polyneuropathy: Secondary | ICD-10-CM | POA: Diagnosis not present

## 2017-06-24 DIAGNOSIS — I129 Hypertensive chronic kidney disease with stage 1 through stage 4 chronic kidney disease, or unspecified chronic kidney disease: Secondary | ICD-10-CM | POA: Diagnosis not present

## 2017-06-24 DIAGNOSIS — E1122 Type 2 diabetes mellitus with diabetic chronic kidney disease: Secondary | ICD-10-CM | POA: Diagnosis not present

## 2017-06-25 DIAGNOSIS — E1151 Type 2 diabetes mellitus with diabetic peripheral angiopathy without gangrene: Secondary | ICD-10-CM | POA: Diagnosis not present

## 2017-06-25 DIAGNOSIS — N189 Chronic kidney disease, unspecified: Secondary | ICD-10-CM | POA: Diagnosis not present

## 2017-06-25 DIAGNOSIS — E1142 Type 2 diabetes mellitus with diabetic polyneuropathy: Secondary | ICD-10-CM | POA: Diagnosis not present

## 2017-06-25 DIAGNOSIS — I129 Hypertensive chronic kidney disease with stage 1 through stage 4 chronic kidney disease, or unspecified chronic kidney disease: Secondary | ICD-10-CM | POA: Diagnosis not present

## 2017-06-25 DIAGNOSIS — Z48817 Encounter for surgical aftercare following surgery on the skin and subcutaneous tissue: Secondary | ICD-10-CM | POA: Diagnosis not present

## 2017-06-25 DIAGNOSIS — E1122 Type 2 diabetes mellitus with diabetic chronic kidney disease: Secondary | ICD-10-CM | POA: Diagnosis not present

## 2017-06-29 DIAGNOSIS — N189 Chronic kidney disease, unspecified: Secondary | ICD-10-CM | POA: Diagnosis not present

## 2017-06-29 DIAGNOSIS — Z48817 Encounter for surgical aftercare following surgery on the skin and subcutaneous tissue: Secondary | ICD-10-CM | POA: Diagnosis not present

## 2017-06-29 DIAGNOSIS — I129 Hypertensive chronic kidney disease with stage 1 through stage 4 chronic kidney disease, or unspecified chronic kidney disease: Secondary | ICD-10-CM | POA: Diagnosis not present

## 2017-06-29 DIAGNOSIS — E1122 Type 2 diabetes mellitus with diabetic chronic kidney disease: Secondary | ICD-10-CM | POA: Diagnosis not present

## 2017-06-29 DIAGNOSIS — E1151 Type 2 diabetes mellitus with diabetic peripheral angiopathy without gangrene: Secondary | ICD-10-CM | POA: Diagnosis not present

## 2017-06-29 DIAGNOSIS — E1142 Type 2 diabetes mellitus with diabetic polyneuropathy: Secondary | ICD-10-CM | POA: Diagnosis not present

## 2017-07-03 DIAGNOSIS — N189 Chronic kidney disease, unspecified: Secondary | ICD-10-CM | POA: Diagnosis not present

## 2017-07-03 DIAGNOSIS — E1151 Type 2 diabetes mellitus with diabetic peripheral angiopathy without gangrene: Secondary | ICD-10-CM | POA: Diagnosis not present

## 2017-07-03 DIAGNOSIS — I129 Hypertensive chronic kidney disease with stage 1 through stage 4 chronic kidney disease, or unspecified chronic kidney disease: Secondary | ICD-10-CM | POA: Diagnosis not present

## 2017-07-03 DIAGNOSIS — Z48817 Encounter for surgical aftercare following surgery on the skin and subcutaneous tissue: Secondary | ICD-10-CM | POA: Diagnosis not present

## 2017-07-03 DIAGNOSIS — E1122 Type 2 diabetes mellitus with diabetic chronic kidney disease: Secondary | ICD-10-CM | POA: Diagnosis not present

## 2017-07-03 DIAGNOSIS — E1142 Type 2 diabetes mellitus with diabetic polyneuropathy: Secondary | ICD-10-CM | POA: Diagnosis not present

## 2017-07-06 DIAGNOSIS — E1151 Type 2 diabetes mellitus with diabetic peripheral angiopathy without gangrene: Secondary | ICD-10-CM | POA: Diagnosis not present

## 2017-07-06 DIAGNOSIS — E1122 Type 2 diabetes mellitus with diabetic chronic kidney disease: Secondary | ICD-10-CM | POA: Diagnosis not present

## 2017-07-06 DIAGNOSIS — N189 Chronic kidney disease, unspecified: Secondary | ICD-10-CM | POA: Diagnosis not present

## 2017-07-06 DIAGNOSIS — Z48817 Encounter for surgical aftercare following surgery on the skin and subcutaneous tissue: Secondary | ICD-10-CM | POA: Diagnosis not present

## 2017-07-06 DIAGNOSIS — I129 Hypertensive chronic kidney disease with stage 1 through stage 4 chronic kidney disease, or unspecified chronic kidney disease: Secondary | ICD-10-CM | POA: Diagnosis not present

## 2017-07-06 DIAGNOSIS — E1142 Type 2 diabetes mellitus with diabetic polyneuropathy: Secondary | ICD-10-CM | POA: Diagnosis not present

## 2017-07-07 DIAGNOSIS — L97522 Non-pressure chronic ulcer of other part of left foot with fat layer exposed: Secondary | ICD-10-CM | POA: Diagnosis not present

## 2017-07-07 DIAGNOSIS — L03115 Cellulitis of right lower limb: Secondary | ICD-10-CM | POA: Diagnosis not present

## 2017-07-07 DIAGNOSIS — L97512 Non-pressure chronic ulcer of other part of right foot with fat layer exposed: Secondary | ICD-10-CM | POA: Diagnosis not present

## 2017-07-09 DIAGNOSIS — E1151 Type 2 diabetes mellitus with diabetic peripheral angiopathy without gangrene: Secondary | ICD-10-CM | POA: Diagnosis not present

## 2017-07-09 DIAGNOSIS — N189 Chronic kidney disease, unspecified: Secondary | ICD-10-CM | POA: Diagnosis not present

## 2017-07-09 DIAGNOSIS — Z48817 Encounter for surgical aftercare following surgery on the skin and subcutaneous tissue: Secondary | ICD-10-CM | POA: Diagnosis not present

## 2017-07-09 DIAGNOSIS — E1142 Type 2 diabetes mellitus with diabetic polyneuropathy: Secondary | ICD-10-CM | POA: Diagnosis not present

## 2017-07-09 DIAGNOSIS — I129 Hypertensive chronic kidney disease with stage 1 through stage 4 chronic kidney disease, or unspecified chronic kidney disease: Secondary | ICD-10-CM | POA: Diagnosis not present

## 2017-07-09 DIAGNOSIS — E1122 Type 2 diabetes mellitus with diabetic chronic kidney disease: Secondary | ICD-10-CM | POA: Diagnosis not present

## 2017-07-13 DIAGNOSIS — E1142 Type 2 diabetes mellitus with diabetic polyneuropathy: Secondary | ICD-10-CM | POA: Diagnosis not present

## 2017-07-13 DIAGNOSIS — I129 Hypertensive chronic kidney disease with stage 1 through stage 4 chronic kidney disease, or unspecified chronic kidney disease: Secondary | ICD-10-CM | POA: Diagnosis not present

## 2017-07-13 DIAGNOSIS — Z48817 Encounter for surgical aftercare following surgery on the skin and subcutaneous tissue: Secondary | ICD-10-CM | POA: Diagnosis not present

## 2017-07-13 DIAGNOSIS — E1151 Type 2 diabetes mellitus with diabetic peripheral angiopathy without gangrene: Secondary | ICD-10-CM | POA: Diagnosis not present

## 2017-07-13 DIAGNOSIS — E1122 Type 2 diabetes mellitus with diabetic chronic kidney disease: Secondary | ICD-10-CM | POA: Diagnosis not present

## 2017-07-13 DIAGNOSIS — N189 Chronic kidney disease, unspecified: Secondary | ICD-10-CM | POA: Diagnosis not present

## 2017-07-16 DIAGNOSIS — E1151 Type 2 diabetes mellitus with diabetic peripheral angiopathy without gangrene: Secondary | ICD-10-CM | POA: Diagnosis not present

## 2017-07-16 DIAGNOSIS — E1122 Type 2 diabetes mellitus with diabetic chronic kidney disease: Secondary | ICD-10-CM | POA: Diagnosis not present

## 2017-07-16 DIAGNOSIS — N189 Chronic kidney disease, unspecified: Secondary | ICD-10-CM | POA: Diagnosis not present

## 2017-07-16 DIAGNOSIS — Z48817 Encounter for surgical aftercare following surgery on the skin and subcutaneous tissue: Secondary | ICD-10-CM | POA: Diagnosis not present

## 2017-07-16 DIAGNOSIS — E1142 Type 2 diabetes mellitus with diabetic polyneuropathy: Secondary | ICD-10-CM | POA: Diagnosis not present

## 2017-07-16 DIAGNOSIS — I129 Hypertensive chronic kidney disease with stage 1 through stage 4 chronic kidney disease, or unspecified chronic kidney disease: Secondary | ICD-10-CM | POA: Diagnosis not present

## 2017-07-20 DIAGNOSIS — N189 Chronic kidney disease, unspecified: Secondary | ICD-10-CM | POA: Diagnosis not present

## 2017-07-20 DIAGNOSIS — Z48817 Encounter for surgical aftercare following surgery on the skin and subcutaneous tissue: Secondary | ICD-10-CM | POA: Diagnosis not present

## 2017-07-20 DIAGNOSIS — E1151 Type 2 diabetes mellitus with diabetic peripheral angiopathy without gangrene: Secondary | ICD-10-CM | POA: Diagnosis not present

## 2017-07-20 DIAGNOSIS — I129 Hypertensive chronic kidney disease with stage 1 through stage 4 chronic kidney disease, or unspecified chronic kidney disease: Secondary | ICD-10-CM | POA: Diagnosis not present

## 2017-07-20 DIAGNOSIS — E1142 Type 2 diabetes mellitus with diabetic polyneuropathy: Secondary | ICD-10-CM | POA: Diagnosis not present

## 2017-07-20 DIAGNOSIS — E1122 Type 2 diabetes mellitus with diabetic chronic kidney disease: Secondary | ICD-10-CM | POA: Diagnosis not present

## 2017-07-21 DIAGNOSIS — L97512 Non-pressure chronic ulcer of other part of right foot with fat layer exposed: Secondary | ICD-10-CM | POA: Diagnosis not present

## 2017-07-21 DIAGNOSIS — I890 Lymphedema, not elsewhere classified: Secondary | ICD-10-CM | POA: Diagnosis not present

## 2017-07-21 DIAGNOSIS — L97522 Non-pressure chronic ulcer of other part of left foot with fat layer exposed: Secondary | ICD-10-CM | POA: Diagnosis not present

## 2017-07-22 DIAGNOSIS — R600 Localized edema: Secondary | ICD-10-CM | POA: Diagnosis not present

## 2017-07-24 DIAGNOSIS — S06361A Traumatic hemorrhage of cerebrum, unspecified, with loss of consciousness of 30 minutes or less, initial encounter: Secondary | ICD-10-CM | POA: Diagnosis not present

## 2017-07-24 DIAGNOSIS — S0101XA Laceration without foreign body of scalp, initial encounter: Secondary | ICD-10-CM | POA: Diagnosis not present

## 2017-07-24 DIAGNOSIS — Y92009 Unspecified place in unspecified non-institutional (private) residence as the place of occurrence of the external cause: Secondary | ICD-10-CM | POA: Diagnosis not present

## 2017-07-24 DIAGNOSIS — R55 Syncope and collapse: Secondary | ICD-10-CM | POA: Diagnosis not present

## 2017-07-24 DIAGNOSIS — R402362 Coma scale, best motor response, obeys commands, at arrival to emergency department: Secondary | ICD-10-CM | POA: Diagnosis not present

## 2017-07-24 DIAGNOSIS — Z79899 Other long term (current) drug therapy: Secondary | ICD-10-CM | POA: Diagnosis not present

## 2017-07-24 DIAGNOSIS — R402142 Coma scale, eyes open, spontaneous, at arrival to emergency department: Secondary | ICD-10-CM | POA: Diagnosis not present

## 2017-07-24 DIAGNOSIS — S066X0D Traumatic subarachnoid hemorrhage without loss of consciousness, subsequent encounter: Secondary | ICD-10-CM | POA: Diagnosis not present

## 2017-07-24 DIAGNOSIS — S069X1A Unspecified intracranial injury with loss of consciousness of 30 minutes or less, initial encounter: Secondary | ICD-10-CM | POA: Diagnosis not present

## 2017-07-24 DIAGNOSIS — S0990XA Unspecified injury of head, initial encounter: Secondary | ICD-10-CM | POA: Diagnosis not present

## 2017-07-24 DIAGNOSIS — Z9181 History of falling: Secondary | ICD-10-CM | POA: Diagnosis not present

## 2017-07-24 DIAGNOSIS — W228XXA Striking against or struck by other objects, initial encounter: Secondary | ICD-10-CM | POA: Diagnosis not present

## 2017-07-24 DIAGNOSIS — S199XXA Unspecified injury of neck, initial encounter: Secondary | ICD-10-CM | POA: Diagnosis not present

## 2017-07-24 DIAGNOSIS — S0003XA Contusion of scalp, initial encounter: Secondary | ICD-10-CM | POA: Diagnosis not present

## 2017-07-24 DIAGNOSIS — I6523 Occlusion and stenosis of bilateral carotid arteries: Secondary | ICD-10-CM | POA: Diagnosis not present

## 2017-07-24 DIAGNOSIS — S066X1A Traumatic subarachnoid hemorrhage with loss of consciousness of 30 minutes or less, initial encounter: Secondary | ICD-10-CM | POA: Diagnosis not present

## 2017-07-24 DIAGNOSIS — Z23 Encounter for immunization: Secondary | ICD-10-CM | POA: Diagnosis not present

## 2017-07-24 DIAGNOSIS — I629 Nontraumatic intracranial hemorrhage, unspecified: Secondary | ICD-10-CM | POA: Diagnosis not present

## 2017-07-24 DIAGNOSIS — R402252 Coma scale, best verbal response, oriented, at arrival to emergency department: Secondary | ICD-10-CM | POA: Diagnosis not present

## 2017-07-24 DIAGNOSIS — I083 Combined rheumatic disorders of mitral, aortic and tricuspid valves: Secondary | ICD-10-CM | POA: Diagnosis not present

## 2017-07-24 DIAGNOSIS — R079 Chest pain, unspecified: Secondary | ICD-10-CM | POA: Diagnosis not present

## 2017-07-24 DIAGNOSIS — S0181XA Laceration without foreign body of other part of head, initial encounter: Secondary | ICD-10-CM | POA: Diagnosis not present

## 2017-07-24 DIAGNOSIS — M7989 Other specified soft tissue disorders: Secondary | ICD-10-CM | POA: Diagnosis not present

## 2017-07-24 DIAGNOSIS — W19XXXA Unspecified fall, initial encounter: Secondary | ICD-10-CM | POA: Diagnosis not present

## 2017-07-24 DIAGNOSIS — W1839XA Other fall on same level, initial encounter: Secondary | ICD-10-CM | POA: Diagnosis not present

## 2017-07-27 DIAGNOSIS — E1122 Type 2 diabetes mellitus with diabetic chronic kidney disease: Secondary | ICD-10-CM | POA: Diagnosis not present

## 2017-07-27 DIAGNOSIS — I129 Hypertensive chronic kidney disease with stage 1 through stage 4 chronic kidney disease, or unspecified chronic kidney disease: Secondary | ICD-10-CM | POA: Diagnosis not present

## 2017-07-27 DIAGNOSIS — N189 Chronic kidney disease, unspecified: Secondary | ICD-10-CM | POA: Diagnosis not present

## 2017-07-27 DIAGNOSIS — E1142 Type 2 diabetes mellitus with diabetic polyneuropathy: Secondary | ICD-10-CM | POA: Diagnosis not present

## 2017-07-27 DIAGNOSIS — E1151 Type 2 diabetes mellitus with diabetic peripheral angiopathy without gangrene: Secondary | ICD-10-CM | POA: Diagnosis not present

## 2017-07-27 DIAGNOSIS — Z48817 Encounter for surgical aftercare following surgery on the skin and subcutaneous tissue: Secondary | ICD-10-CM | POA: Diagnosis not present

## 2017-07-31 ENCOUNTER — Other Ambulatory Visit: Payer: Self-pay

## 2017-07-31 ENCOUNTER — Encounter (HOSPITAL_COMMUNITY): Payer: Self-pay

## 2017-07-31 ENCOUNTER — Emergency Department (HOSPITAL_COMMUNITY): Payer: Medicare HMO

## 2017-07-31 ENCOUNTER — Inpatient Hospital Stay (HOSPITAL_COMMUNITY)
Admission: EM | Admit: 2017-07-31 | Discharge: 2017-08-11 | DRG: 982 | Disposition: A | Discharge status: Skilled Nursing Facility | Payer: Medicare HMO | Attending: Internal Medicine | Admitting: Internal Medicine

## 2017-07-31 DIAGNOSIS — M7989 Other specified soft tissue disorders: Secondary | ICD-10-CM | POA: Diagnosis not present

## 2017-07-31 DIAGNOSIS — E559 Vitamin D deficiency, unspecified: Secondary | ICD-10-CM | POA: Diagnosis present

## 2017-07-31 DIAGNOSIS — I10 Essential (primary) hypertension: Secondary | ICD-10-CM | POA: Diagnosis present

## 2017-07-31 DIAGNOSIS — Z4789 Encounter for other orthopedic aftercare: Secondary | ICD-10-CM | POA: Diagnosis not present

## 2017-07-31 DIAGNOSIS — R05 Cough: Secondary | ICD-10-CM | POA: Diagnosis not present

## 2017-07-31 DIAGNOSIS — K219 Gastro-esophageal reflux disease without esophagitis: Secondary | ICD-10-CM | POA: Diagnosis present

## 2017-07-31 DIAGNOSIS — Z87891 Personal history of nicotine dependence: Secondary | ICD-10-CM

## 2017-07-31 DIAGNOSIS — Z8673 Personal history of transient ischemic attack (TIA), and cerebral infarction without residual deficits: Secondary | ICD-10-CM

## 2017-07-31 DIAGNOSIS — W19XXXD Unspecified fall, subsequent encounter: Secondary | ICD-10-CM | POA: Diagnosis present

## 2017-07-31 DIAGNOSIS — M79672 Pain in left foot: Secondary | ICD-10-CM | POA: Diagnosis not present

## 2017-07-31 DIAGNOSIS — S3690XA Unspecified injury of unspecified intra-abdominal organ, initial encounter: Secondary | ICD-10-CM | POA: Diagnosis not present

## 2017-07-31 DIAGNOSIS — Z8249 Family history of ischemic heart disease and other diseases of the circulatory system: Secondary | ICD-10-CM

## 2017-07-31 DIAGNOSIS — G2581 Restless legs syndrome: Secondary | ICD-10-CM | POA: Diagnosis present

## 2017-07-31 DIAGNOSIS — L97509 Non-pressure chronic ulcer of other part of unspecified foot with unspecified severity: Secondary | ICD-10-CM | POA: Diagnosis not present

## 2017-07-31 DIAGNOSIS — S06309D Unspecified focal traumatic brain injury with loss of consciousness of unspecified duration, subsequent encounter: Secondary | ICD-10-CM

## 2017-07-31 DIAGNOSIS — Z8679 Personal history of other diseases of the circulatory system: Secondary | ICD-10-CM | POA: Diagnosis not present

## 2017-07-31 DIAGNOSIS — R0989 Other specified symptoms and signs involving the circulatory and respiratory systems: Secondary | ICD-10-CM

## 2017-07-31 DIAGNOSIS — M86671 Other chronic osteomyelitis, right ankle and foot: Secondary | ICD-10-CM | POA: Diagnosis present

## 2017-07-31 DIAGNOSIS — S80212A Abrasion, left knee, initial encounter: Secondary | ICD-10-CM | POA: Diagnosis present

## 2017-07-31 DIAGNOSIS — S80211A Abrasion, right knee, initial encounter: Secondary | ICD-10-CM | POA: Diagnosis present

## 2017-07-31 DIAGNOSIS — M25552 Pain in left hip: Secondary | ICD-10-CM | POA: Diagnosis not present

## 2017-07-31 DIAGNOSIS — R531 Weakness: Secondary | ICD-10-CM

## 2017-07-31 DIAGNOSIS — N183 Chronic kidney disease, stage 3 (moderate): Secondary | ICD-10-CM | POA: Diagnosis present

## 2017-07-31 DIAGNOSIS — W19XXXA Unspecified fall, initial encounter: Secondary | ICD-10-CM | POA: Diagnosis not present

## 2017-07-31 DIAGNOSIS — Z89412 Acquired absence of left great toe: Secondary | ICD-10-CM

## 2017-07-31 DIAGNOSIS — E11621 Type 2 diabetes mellitus with foot ulcer: Secondary | ICD-10-CM | POA: Diagnosis present

## 2017-07-31 DIAGNOSIS — L97519 Non-pressure chronic ulcer of other part of right foot with unspecified severity: Secondary | ICD-10-CM

## 2017-07-31 DIAGNOSIS — E1121 Type 2 diabetes mellitus with diabetic nephropathy: Secondary | ICD-10-CM | POA: Diagnosis not present

## 2017-07-31 DIAGNOSIS — L97529 Non-pressure chronic ulcer of other part of left foot with unspecified severity: Secondary | ICD-10-CM | POA: Diagnosis present

## 2017-07-31 DIAGNOSIS — M6282 Rhabdomyolysis: Secondary | ICD-10-CM | POA: Diagnosis present

## 2017-07-31 DIAGNOSIS — N179 Acute kidney failure, unspecified: Secondary | ICD-10-CM | POA: Diagnosis present

## 2017-07-31 DIAGNOSIS — E1142 Type 2 diabetes mellitus with diabetic polyneuropathy: Secondary | ICD-10-CM | POA: Diagnosis not present

## 2017-07-31 DIAGNOSIS — W07XXXA Fall from chair, initial encounter: Secondary | ICD-10-CM | POA: Diagnosis present

## 2017-07-31 DIAGNOSIS — E1151 Type 2 diabetes mellitus with diabetic peripheral angiopathy without gangrene: Secondary | ICD-10-CM | POA: Diagnosis present

## 2017-07-31 DIAGNOSIS — G629 Polyneuropathy, unspecified: Secondary | ICD-10-CM | POA: Diagnosis present

## 2017-07-31 DIAGNOSIS — S0101XD Laceration without foreign body of scalp, subsequent encounter: Secondary | ICD-10-CM

## 2017-07-31 DIAGNOSIS — R1032 Left lower quadrant pain: Secondary | ICD-10-CM | POA: Diagnosis present

## 2017-07-31 DIAGNOSIS — E860 Dehydration: Secondary | ICD-10-CM | POA: Diagnosis present

## 2017-07-31 DIAGNOSIS — L97516 Non-pressure chronic ulcer of other part of right foot with bone involvement without evidence of necrosis: Secondary | ICD-10-CM | POA: Diagnosis present

## 2017-07-31 DIAGNOSIS — S79912A Unspecified injury of left hip, initial encounter: Secondary | ICD-10-CM | POA: Diagnosis not present

## 2017-07-31 DIAGNOSIS — M79671 Pain in right foot: Secondary | ICD-10-CM | POA: Diagnosis not present

## 2017-07-31 DIAGNOSIS — E785 Hyperlipidemia, unspecified: Secondary | ICD-10-CM | POA: Diagnosis present

## 2017-07-31 DIAGNOSIS — R9431 Abnormal electrocardiogram [ECG] [EKG]: Secondary | ICD-10-CM | POA: Diagnosis not present

## 2017-07-31 DIAGNOSIS — E1169 Type 2 diabetes mellitus with other specified complication: Secondary | ICD-10-CM | POA: Diagnosis present

## 2017-07-31 DIAGNOSIS — Z7984 Long term (current) use of oral hypoglycemic drugs: Secondary | ICD-10-CM

## 2017-07-31 DIAGNOSIS — E1122 Type 2 diabetes mellitus with diabetic chronic kidney disease: Secondary | ICD-10-CM | POA: Diagnosis present

## 2017-07-31 DIAGNOSIS — I6200 Nontraumatic subdural hemorrhage, unspecified: Secondary | ICD-10-CM | POA: Diagnosis not present

## 2017-07-31 DIAGNOSIS — Z66 Do not resuscitate: Secondary | ICD-10-CM | POA: Diagnosis present

## 2017-07-31 DIAGNOSIS — Y92009 Unspecified place in unspecified non-institutional (private) residence as the place of occurrence of the external cause: Secondary | ICD-10-CM | POA: Diagnosis not present

## 2017-07-31 DIAGNOSIS — N189 Chronic kidney disease, unspecified: Secondary | ICD-10-CM | POA: Diagnosis not present

## 2017-07-31 DIAGNOSIS — M6281 Muscle weakness (generalized): Secondary | ICD-10-CM | POA: Diagnosis not present

## 2017-07-31 DIAGNOSIS — R100 Acute abdomen: Secondary | ICD-10-CM | POA: Diagnosis not present

## 2017-07-31 DIAGNOSIS — I129 Hypertensive chronic kidney disease with stage 1 through stage 4 chronic kidney disease, or unspecified chronic kidney disease: Secondary | ICD-10-CM | POA: Diagnosis present

## 2017-07-31 DIAGNOSIS — Z48817 Encounter for surgical aftercare following surgery on the skin and subcutaneous tissue: Secondary | ICD-10-CM | POA: Diagnosis not present

## 2017-07-31 DIAGNOSIS — R41841 Cognitive communication deficit: Secondary | ICD-10-CM | POA: Diagnosis not present

## 2017-07-31 DIAGNOSIS — R296 Repeated falls: Secondary | ICD-10-CM | POA: Diagnosis present

## 2017-07-31 DIAGNOSIS — E1129 Type 2 diabetes mellitus with other diabetic kidney complication: Secondary | ICD-10-CM | POA: Diagnosis present

## 2017-07-31 DIAGNOSIS — M86171 Other acute osteomyelitis, right ankle and foot: Secondary | ICD-10-CM | POA: Diagnosis not present

## 2017-07-31 DIAGNOSIS — T796XXA Traumatic ischemia of muscle, initial encounter: Secondary | ICD-10-CM | POA: Diagnosis not present

## 2017-07-31 DIAGNOSIS — R4182 Altered mental status, unspecified: Secondary | ICD-10-CM | POA: Diagnosis not present

## 2017-07-31 DIAGNOSIS — I629 Nontraumatic intracranial hemorrhage, unspecified: Secondary | ICD-10-CM | POA: Diagnosis not present

## 2017-07-31 DIAGNOSIS — R059 Cough, unspecified: Secondary | ICD-10-CM

## 2017-07-31 DIAGNOSIS — R278 Other lack of coordination: Secondary | ICD-10-CM | POA: Diagnosis not present

## 2017-07-31 DIAGNOSIS — R2689 Other abnormalities of gait and mobility: Secondary | ICD-10-CM | POA: Diagnosis not present

## 2017-07-31 LAB — LIPASE, BLOOD: Lipase: 36 U/L (ref 11–51)

## 2017-07-31 LAB — COMPREHENSIVE METABOLIC PANEL WITH GFR
ALT: 23 U/L (ref 14–54)
AST: 51 U/L — ABNORMAL HIGH (ref 15–41)
Albumin: 3.3 g/dL — ABNORMAL LOW (ref 3.5–5.0)
Alkaline Phosphatase: 81 U/L (ref 38–126)
Anion gap: 11 (ref 5–15)
BUN: 28 mg/dL — ABNORMAL HIGH (ref 6–20)
CO2: 21 mmol/L — ABNORMAL LOW (ref 22–32)
Calcium: 9.2 mg/dL (ref 8.9–10.3)
Chloride: 102 mmol/L (ref 101–111)
Creatinine, Ser: 1.84 mg/dL — ABNORMAL HIGH (ref 0.44–1.00)
GFR calc Af Amer: 29 mL/min — ABNORMAL LOW
GFR calc non Af Amer: 25 mL/min — ABNORMAL LOW
Glucose, Bld: 123 mg/dL — ABNORMAL HIGH (ref 65–99)
Potassium: 4.5 mmol/L (ref 3.5–5.1)
Sodium: 134 mmol/L — ABNORMAL LOW (ref 135–145)
Total Bilirubin: 0.4 mg/dL (ref 0.3–1.2)
Total Protein: 7.9 g/dL (ref 6.5–8.1)

## 2017-07-31 LAB — URINALYSIS, ROUTINE W REFLEX MICROSCOPIC
Bilirubin Urine: NEGATIVE
Glucose, UA: NEGATIVE mg/dL
Hgb urine dipstick: NEGATIVE
Ketones, ur: NEGATIVE mg/dL
Leukocytes, UA: NEGATIVE
Nitrite: NEGATIVE
Protein, ur: 30 mg/dL — AB
Specific Gravity, Urine: 1.017 (ref 1.005–1.030)
pH: 6 (ref 5.0–8.0)

## 2017-07-31 LAB — CBC
HCT: 33.7 % — ABNORMAL LOW (ref 36.0–46.0)
Hemoglobin: 11.4 g/dL — ABNORMAL LOW (ref 12.0–15.0)
MCH: 30 pg (ref 26.0–34.0)
MCHC: 33.8 g/dL (ref 30.0–36.0)
MCV: 88.7 fL (ref 78.0–100.0)
Platelets: 299 10*3/uL (ref 150–400)
RBC: 3.8 MIL/uL — ABNORMAL LOW (ref 3.87–5.11)
RDW: 13.6 % (ref 11.5–15.5)
WBC: 11.8 10*3/uL — ABNORMAL HIGH (ref 4.0–10.5)
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
## 2017-07-31 NOTE — ED Triage Notes (Signed)
Per EMS- Patient c/o abdominal cramping since this AM in bilateral lower quadrants. Patient states she was having increased pain and attempted to get off of the cough and slid from the couch to the floor. Patient remained on the floor all day because pain was too much.

## 2017-08-01 ENCOUNTER — Inpatient Hospital Stay (HOSPITAL_COMMUNITY): Payer: Medicare HMO

## 2017-08-01 DIAGNOSIS — L97516 Non-pressure chronic ulcer of other part of right foot with bone involvement without evidence of necrosis: Secondary | ICD-10-CM | POA: Diagnosis present

## 2017-08-01 DIAGNOSIS — G2581 Restless legs syndrome: Secondary | ICD-10-CM

## 2017-08-01 DIAGNOSIS — E1151 Type 2 diabetes mellitus with diabetic peripheral angiopathy without gangrene: Secondary | ICD-10-CM | POA: Diagnosis present

## 2017-08-01 DIAGNOSIS — E785 Hyperlipidemia, unspecified: Secondary | ICD-10-CM | POA: Diagnosis present

## 2017-08-01 DIAGNOSIS — L97509 Non-pressure chronic ulcer of other part of unspecified foot with unspecified severity: Secondary | ICD-10-CM

## 2017-08-01 DIAGNOSIS — Z8679 Personal history of other diseases of the circulatory system: Secondary | ICD-10-CM | POA: Diagnosis not present

## 2017-08-01 DIAGNOSIS — R1032 Left lower quadrant pain: Secondary | ICD-10-CM | POA: Diagnosis not present

## 2017-08-01 DIAGNOSIS — N179 Acute kidney failure, unspecified: Secondary | ICD-10-CM | POA: Diagnosis present

## 2017-08-01 DIAGNOSIS — M86171 Other acute osteomyelitis, right ankle and foot: Secondary | ICD-10-CM | POA: Diagnosis not present

## 2017-08-01 DIAGNOSIS — E11621 Type 2 diabetes mellitus with foot ulcer: Secondary | ICD-10-CM | POA: Diagnosis present

## 2017-08-01 DIAGNOSIS — M86671 Other chronic osteomyelitis, right ankle and foot: Secondary | ICD-10-CM | POA: Diagnosis present

## 2017-08-01 DIAGNOSIS — K219 Gastro-esophageal reflux disease without esophagitis: Secondary | ICD-10-CM

## 2017-08-01 DIAGNOSIS — E1122 Type 2 diabetes mellitus with diabetic chronic kidney disease: Secondary | ICD-10-CM | POA: Diagnosis present

## 2017-08-01 DIAGNOSIS — M6282 Rhabdomyolysis: Secondary | ICD-10-CM | POA: Diagnosis present

## 2017-08-01 DIAGNOSIS — Y92009 Unspecified place in unspecified non-institutional (private) residence as the place of occurrence of the external cause: Secondary | ICD-10-CM | POA: Diagnosis not present

## 2017-08-01 DIAGNOSIS — W19XXXA Unspecified fall, initial encounter: Secondary | ICD-10-CM | POA: Diagnosis not present

## 2017-08-01 DIAGNOSIS — G629 Polyneuropathy, unspecified: Secondary | ICD-10-CM | POA: Diagnosis present

## 2017-08-01 DIAGNOSIS — N183 Chronic kidney disease, stage 3 (moderate): Secondary | ICD-10-CM

## 2017-08-01 DIAGNOSIS — L97519 Non-pressure chronic ulcer of other part of right foot with unspecified severity: Secondary | ICD-10-CM

## 2017-08-01 DIAGNOSIS — I129 Hypertensive chronic kidney disease with stage 1 through stage 4 chronic kidney disease, or unspecified chronic kidney disease: Secondary | ICD-10-CM | POA: Diagnosis present

## 2017-08-01 DIAGNOSIS — T796XXA Traumatic ischemia of muscle, initial encounter: Secondary | ICD-10-CM | POA: Diagnosis not present

## 2017-08-01 DIAGNOSIS — L97529 Non-pressure chronic ulcer of other part of left foot with unspecified severity: Secondary | ICD-10-CM | POA: Diagnosis present

## 2017-08-01 DIAGNOSIS — S80212A Abrasion, left knee, initial encounter: Secondary | ICD-10-CM | POA: Diagnosis present

## 2017-08-01 DIAGNOSIS — E1129 Type 2 diabetes mellitus with other diabetic kidney complication: Secondary | ICD-10-CM | POA: Diagnosis present

## 2017-08-01 DIAGNOSIS — E1169 Type 2 diabetes mellitus with other specified complication: Secondary | ICD-10-CM | POA: Diagnosis present

## 2017-08-01 DIAGNOSIS — N170 Acute kidney failure with tubular necrosis: Secondary | ICD-10-CM

## 2017-08-01 DIAGNOSIS — W07XXXA Fall from chair, initial encounter: Secondary | ICD-10-CM | POA: Diagnosis present

## 2017-08-01 DIAGNOSIS — Z8673 Personal history of transient ischemic attack (TIA), and cerebral infarction without residual deficits: Secondary | ICD-10-CM | POA: Diagnosis not present

## 2017-08-01 DIAGNOSIS — Z89412 Acquired absence of left great toe: Secondary | ICD-10-CM | POA: Diagnosis not present

## 2017-08-01 DIAGNOSIS — E1121 Type 2 diabetes mellitus with diabetic nephropathy: Secondary | ICD-10-CM | POA: Diagnosis not present

## 2017-08-01 DIAGNOSIS — E860 Dehydration: Secondary | ICD-10-CM | POA: Diagnosis present

## 2017-08-01 DIAGNOSIS — S0101XD Laceration without foreign body of scalp, subsequent encounter: Secondary | ICD-10-CM | POA: Diagnosis not present

## 2017-08-01 DIAGNOSIS — Z66 Do not resuscitate: Secondary | ICD-10-CM | POA: Diagnosis present

## 2017-08-01 DIAGNOSIS — S06309D Unspecified focal traumatic brain injury with loss of consciousness of unspecified duration, subsequent encounter: Secondary | ICD-10-CM | POA: Diagnosis not present

## 2017-08-01 DIAGNOSIS — R531 Weakness: Secondary | ICD-10-CM | POA: Diagnosis present

## 2017-08-01 DIAGNOSIS — S80211A Abrasion, right knee, initial encounter: Secondary | ICD-10-CM | POA: Diagnosis present

## 2017-08-01 DIAGNOSIS — W19XXXD Unspecified fall, subsequent encounter: Secondary | ICD-10-CM | POA: Diagnosis present

## 2017-08-01 DIAGNOSIS — R296 Repeated falls: Secondary | ICD-10-CM | POA: Diagnosis present

## 2017-08-01 LAB — CBC
HCT: 28.9 % — ABNORMAL LOW (ref 36.0–46.0)
HEMOGLOBIN: 9.5 g/dL — AB (ref 12.0–15.0)
MCH: 29.5 pg (ref 26.0–34.0)
MCHC: 32.9 g/dL (ref 30.0–36.0)
MCV: 89.8 fL (ref 78.0–100.0)
PLATELETS: 320 10*3/uL (ref 150–400)
RBC: 3.22 MIL/uL — ABNORMAL LOW (ref 3.87–5.11)
RDW: 13.9 % (ref 11.5–15.5)
WBC: 8.8 10*3/uL (ref 4.0–10.5)

## 2017-08-01 LAB — BASIC METABOLIC PANEL
ANION GAP: 7 (ref 5–15)
BUN: 24 mg/dL — AB (ref 6–20)
CALCIUM: 8.3 mg/dL — AB (ref 8.9–10.3)
CO2: 20 mmol/L — AB (ref 22–32)
CREATININE: 1.87 mg/dL — AB (ref 0.44–1.00)
Chloride: 108 mmol/L (ref 101–111)
GFR calc Af Amer: 28 mL/min — ABNORMAL LOW (ref >60)
GFR, EST NON AFRICAN AMERICAN: 24 mL/min — AB (ref >60)
GLUCOSE: 120 mg/dL — AB (ref 65–99)
Potassium: 5 mmol/L (ref 3.5–5.1)
Sodium: 135 mmol/L (ref 135–145)

## 2017-08-01 LAB — CK
CK TOTAL: 2296 U/L — AB (ref 38–234)
CK TOTAL: 1794 U/L — AB (ref 38–234)

## 2017-08-01 LAB — GLUCOSE, CAPILLARY
GLUCOSE-CAPILLARY: 104 mg/dL — AB (ref 65–99)
GLUCOSE-CAPILLARY: 163 mg/dL — AB (ref 65–99)
Glucose-Capillary: 141 mg/dL — ABNORMAL HIGH (ref 65–99)
Glucose-Capillary: 122 mg/dL — ABNORMAL HIGH (ref 65–99)

## 2017-08-01 MED ORDER — HYDRALAZINE HCL 20 MG/ML IJ SOLN: 5.0000 mg | INTRAMUSCULAR | Status: DC | PRN

## 2017-08-01 MED ORDER — METOPROLOL TARTRATE 50 MG PO TABS
50.0000 mg | ORAL_TABLET | Freq: Two times a day (BID) | ORAL | Status: DC
  Administered 2017-08-01 – 2017-08-11 (×20): 50 mg via ORAL
  Filled 2017-08-01: qty 2
  Filled 2017-08-01: qty 1
  Filled 2017-08-01: qty 4
  Filled 2017-08-01: qty 1
  Filled 2017-08-01: qty 2
  Filled 2017-08-01 (×3): qty 1
  Filled 2017-08-01: qty 2
  Filled 2017-08-01 (×4): qty 1
  Filled 2017-08-01: qty 2
  Filled 2017-08-01: qty 1
  Filled 2017-08-01: qty 2
  Filled 2017-08-01 (×3): qty 1
  Filled 2017-08-01: qty 2

## 2017-08-01 MED ORDER — PSYLLIUM 95 % PO PACK
1.0000 | PACK | Freq: Every day | ORAL | Status: DC | PRN
  Filled 2017-08-01: qty 1

## 2017-08-01 MED ORDER — FOLIC ACID 1 MG PO TABS
0.5000 mg | ORAL_TABLET | Freq: Every day | ORAL | Status: DC
  Administered 2017-08-01 – 2017-08-11 (×11): 0.5 mg via ORAL
  Filled 2017-08-01 (×11): qty 1

## 2017-08-01 MED ORDER — SODIUM CHLORIDE 0.9 % IV SOLN: INTRAVENOUS | Status: DC

## 2017-08-01 MED ORDER — VITAMIN E 180 MG (400 UNIT) PO CAPS
400.0000 [IU] | ORAL_CAPSULE | Freq: Every day | ORAL | Status: DC
  Administered 2017-08-01 – 2017-08-11 (×11): 400 [IU] via ORAL
  Filled 2017-08-01 (×11): qty 1

## 2017-08-01 MED ORDER — INSULIN ASPART 100 UNIT/ML ~~LOC~~ SOLN
0.0000 [IU] | Freq: Every day | SUBCUTANEOUS | Status: DC
Start: 2017-08-01 — End: 2017-08-11
  Administered 2017-08-09: 3 [IU] via SUBCUTANEOUS

## 2017-08-01 MED ORDER — AMLODIPINE BESYLATE 5 MG PO TABS
5.0000 mg | ORAL_TABLET | Freq: Every day | ORAL | Status: DC
  Administered 2017-08-02 – 2017-08-11 (×10): 5 mg via ORAL
  Filled 2017-08-01 (×11): qty 1

## 2017-08-01 MED ORDER — ATORVASTATIN CALCIUM 40 MG PO TABS
40.0000 mg | ORAL_TABLET | Freq: Every day | ORAL | Status: DC
  Administered 2017-08-01 – 2017-08-11 (×11): 40 mg via ORAL
  Filled 2017-08-01 (×11): qty 1

## 2017-08-01 MED ORDER — LEVETIRACETAM 500 MG PO TABS
500.0000 mg | ORAL_TABLET | Freq: Two times a day (BID) | ORAL | Status: DC
  Administered 2017-08-01 – 2017-08-11 (×22): 500 mg via ORAL
  Filled 2017-08-01 (×22): qty 1

## 2017-08-01 MED ORDER — SODIUM CHLORIDE 0.9 % IV BOLUS (SEPSIS)
500.0000 mL | Freq: Once | INTRAVENOUS | Status: AC
  Administered 2017-08-01: 500 mL via INTRAVENOUS

## 2017-08-01 MED ORDER — ONDANSETRON HCL 4 MG/2ML IJ SOLN: 4.0000 mg | Freq: Four times a day (QID) | INTRAMUSCULAR | Status: DC | PRN

## 2017-08-01 MED ORDER — PRAMIPEXOLE DIHYDROCHLORIDE 0.25 MG PO TABS
0.5000 mg | ORAL_TABLET | Freq: Every day | ORAL | Status: DC
  Administered 2017-08-01 – 2017-08-10 (×10): 0.5 mg via ORAL
  Filled 2017-08-01 (×12): qty 2

## 2017-08-01 MED ORDER — OXYCODONE HCL 5 MG PO TABS
5.0000 mg | ORAL_TABLET | Freq: Four times a day (QID) | ORAL | Status: DC | PRN
  Administered 2017-08-02 – 2017-08-06 (×3): 5 mg via ORAL
  Filled 2017-08-01 (×4): qty 1

## 2017-08-01 MED ORDER — INSULIN ASPART 100 UNIT/ML ~~LOC~~ SOLN
0.0000 [IU] | Freq: Three times a day (TID) | SUBCUTANEOUS | Status: DC
  Administered 2017-08-01 – 2017-08-02 (×3): 1 [IU] via SUBCUTANEOUS
  Administered 2017-08-02: 2 [IU] via SUBCUTANEOUS
  Administered 2017-08-03 – 2017-08-04 (×4): 1 [IU] via SUBCUTANEOUS
  Administered 2017-08-04 – 2017-08-05 (×4): 2 [IU] via SUBCUTANEOUS
  Administered 2017-08-06 (×2): 1 [IU] via SUBCUTANEOUS
  Administered 2017-08-06: 5 [IU] via SUBCUTANEOUS
  Administered 2017-08-07 (×2): 1 [IU] via SUBCUTANEOUS
  Administered 2017-08-07 – 2017-08-08 (×2): 2 [IU] via SUBCUTANEOUS
  Administered 2017-08-08: 3 [IU] via SUBCUTANEOUS
  Administered 2017-08-08 – 2017-08-09 (×3): 2 [IU] via SUBCUTANEOUS
  Administered 2017-08-09: 1 [IU] via SUBCUTANEOUS
  Administered 2017-08-10: 2 [IU] via SUBCUTANEOUS
  Administered 2017-08-10: 3 [IU] via SUBCUTANEOUS
  Administered 2017-08-10 – 2017-08-11 (×2): 2 [IU] via SUBCUTANEOUS
  Administered 2017-08-11: 3 [IU] via SUBCUTANEOUS

## 2017-08-01 MED ORDER — VITAMIN D 1000 UNITS PO TABS
1000.0000 [IU] | ORAL_TABLET | Freq: Every day | ORAL | Status: DC
  Administered 2017-08-01 – 2017-08-11 (×11): 1000 [IU] via ORAL
  Filled 2017-08-01 (×11): qty 1

## 2017-08-01 MED ORDER — PANTOPRAZOLE SODIUM 40 MG PO TBEC
40.0000 mg | DELAYED_RELEASE_TABLET | Freq: Every day | ORAL | Status: DC
  Administered 2017-08-01 – 2017-08-11 (×11): 40 mg via ORAL
  Filled 2017-08-01 (×11): qty 1

## 2017-08-01 MED ORDER — SODIUM CHLORIDE 0.9 % IV SOLN
INTRAVENOUS | Status: AC
  Administered 2017-08-01 – 2017-08-04 (×10): via INTRAVENOUS

## 2017-08-01 MED ORDER — OMEGA-3-ACID ETHYL ESTERS 1 G PO CAPS
1.0000 g | ORAL_CAPSULE | Freq: Three times a day (TID) | ORAL | Status: DC
  Administered 2017-08-01 – 2017-08-11 (×30): 1 g via ORAL
  Filled 2017-08-01 (×30): qty 1

## 2017-08-01 MED ORDER — ONDANSETRON HCL 4 MG PO TABS: 4.0000 mg | ORAL_TABLET | Freq: Four times a day (QID) | ORAL | Status: DC | PRN

## 2017-08-01 MED ORDER — ZOLPIDEM TARTRATE 5 MG PO TABS
5.0000 mg | ORAL_TABLET | Freq: Every evening | ORAL | Status: DC | PRN
  Administered 2017-08-05: 5 mg via ORAL
  Filled 2017-08-01 (×2): qty 1

## 2017-08-01 MED ORDER — COLLAGENASE 250 UNIT/GM EX OINT
1.0000 "application " | TOPICAL_OINTMENT | Freq: Every day | CUTANEOUS | Status: DC
  Administered 2017-08-01 – 2017-08-03 (×3): 1 via TOPICAL
  Filled 2017-08-01: qty 90

## 2017-08-01 NOTE — ED Notes (Signed)
Assigned 1502 @ 7:14 call report @ 7:34

## 2017-08-01 NOTE — Evaluation (Signed)
Physical Therapy Evaluation Patient Details Name: Sara Griffith MRN: [id number] DOB: Mar 06, 1936 Today's Date: 08/01/2017   History of Present Illness   Sara Griffith is a 81 y.o. female with medical history significant of hypertension, hyperlipidemia, diabetes mellitus, TIA, GERD, RLS, PVD, CKD-III, recent fall which caused small ICH, who presents with fall and left groin pain.   Clinical Impression  Pt admitted with above diagnosis. Pt currently with functional limitations due to the deficits listed below (see PT Problem List). Pt's groin pain began after fall last week and likely contributed to falling yesterday. Pain begins in her back and radiates into R groin. Min A for mobility at this point. Pt continues to be a high fall risk, unsafe to be home alone. Recommend SNF for more assistance.  Pt will benefit from skilled PT to increase their independence and safety with mobility to allow discharge to the venue listed below.       Follow Up Recommendations SNF;Supervision/Assistance - 24 hour    Equipment Recommendations  None recommended by PT    Recommendations for Other Services       Precautions / Restrictions Precautions Precautions: Fall Precaution Comments: pt's balance very affected by changes in medication and she has fallen each time she has had med changes Restrictions Weight Bearing Restrictions: No      Mobility  Bed Mobility Overal bed mobility: Needs Assistance Bed Mobility: Supine to Sit     Supine to sit: Min assist     General bed mobility comments: min A for LE's off bed as well as use of rail for rolling. mIn A for elevation of trunk to sitting  Transfers Overall transfer level: Needs assistance Equipment used: Rolling walker (2 wheeled) Transfers: Sit to/from Stand Sit to Stand: Min assist         General transfer comment: min A to steady, vc's for hand placement with RW  Ambulation/Gait Ambulation/Gait assistance: Min assist Ambulation Distance  (Feet): 60 Feet Assistive device: Rolling walker (2 wheeled) Gait Pattern/deviations: Shuffle;Step-through pattern;Trunk flexed Gait velocity: decreased Gait velocity interpretation: <1.8 ft/sec, indicative of risk for recurrent falls General Gait Details: vc's for forward gaze but lifting her head causes neck pain so pt avoids.   Stairs            Wheelchair Mobility    Modified Rankin (Stroke Patients Only)       Balance Overall balance assessment: Needs assistance Sitting-balance support: Single extremity supported Sitting balance-Leahy Scale: Fair     Standing balance support: Bilateral upper extremity supported Standing balance-Leahy Scale: Poor                               Pertinent Vitals/Pain Pain Assessment: Faces Faces Pain Scale: Hurts little more Pain Location: Left groin and back (since fall last week) Pain Descriptors / Indicators: Aching;Sharp Pain Intervention(s): Limited activity within patient's tolerance;Monitored during session    Home Living Family/patient expects to be discharged to:: Private residence Living Arrangements: Alone Available Help at Discharge: Personal care attendant;Available PRN/intermittently Type of Home: Independent living facility Home Access: Level entry     Home Layout: One level Home Equipment: Walker - 2 wheels Additional Comments: pt lives at [facility]    Prior Function Level of Independence: Independent with assistive device(s)         Comments: uses RW for all mobility     Hand Dominance        Extremity/Trunk Assessment  Upper Extremity Assessment Upper Extremity Assessment: Generalized weakness    Lower Extremity Assessment Lower Extremity Assessment: Generalized weakness;LLE deficits/detail LLE Deficits / Details: pt has increased pain with L hip flexion and abduction. Her pain starts in her back and wraps around to the groin region. Felt better when she was ambulating.   LLE Sensation: history of peripheral neuropathy    Cervical / Trunk Assessment Cervical / Trunk Assessment: Kyphotic  Communication   Communication: No difficulties  Cognition Arousal/Alertness: Awake/alert Behavior During Therapy: WFL for tasks assessed/performed Overall Cognitive Status: History of cognitive impairments - at baseline                                 General Comments: pt's daughter reports that she doesn't think her mom has been remembering to take meds at appropriate time, she cannot give me a clear history, and she crawled past several pull cords after she fell yesterday and did not think to pull them.       General Comments General comments (skin integrity, edema, etc.): pt with 2 falls in past week and has further IC bleeding from first fall, not safe to be alone at this point    Exercises General Exercises - Lower Extremity Ankle Circles/Pumps: AROM;Both;10 reps;Supine;Seated Quad Sets: AROM;Both;10 reps;Supine Heel Slides: AROM;Both;10 reps;Supine Hip ABduction/ADduction: AAROM;Left;5 reps;Supine Straight Leg Raises: AAROM;Left;5 reps;Supine   Assessment/Plan    PT Assessment Patient needs continued PT services  PT Problem List Decreased strength;Decreased activity tolerance;Decreased balance;Decreased mobility;Decreased cognition;Decreased knowledge of precautions;Pain;Decreased skin integrity;Impaired sensation       PT Treatment Interventions DME instruction;Gait training;Functional mobility training;Therapeutic activities;Therapeutic exercise;Balance training;Patient/family education    PT Goals (Current goals can be found in the Care Plan section)  Acute Rehab PT Goals Patient Stated Goal: return to [facility] PT Goal Formulation: With patient Time For Goal Achievement: 08/15/17 Potential to Achieve Goals: Good    Frequency Min 3X/week   Barriers to discharge Decreased caregiver support intermittent caregiving     Co-evaluation               AM-PAC PT "6 Clicks" Daily Activity  Outcome Measure Difficulty turning over in bed (including adjusting bedclothes, sheets and blankets)?: A Little Difficulty moving from lying on back to sitting on the side of the bed? : Unable Difficulty sitting down on and standing up from a chair with arms (e.g., wheelchair, bedside commode, etc,.)?: Unable Help needed moving to and from a bed to chair (including a wheelchair)?: A Little Help needed walking in hospital room?: A Little Help needed climbing 3-5 steps with a railing? : A Lot 6 Click Score: 13    End of Session Equipment Utilized During Treatment: Gait belt Activity Tolerance: Patient tolerated treatment well Patient left: in chair;with call bell/phone within reach;with family/visitor present Nurse Communication: Mobility status PT Visit Diagnosis: Unsteadiness on feet (R26.81);Pain;History of falling (Z91.81);Muscle weakness (generalized) (M62.81) Pain - Right/Left: Left Pain - part of body: Hip    Time: 4034-7425 PT Time Calculation (min) (ACUTE ONLY): 45 min   Charges:   PT Evaluation $PT Eval Moderate Complexity: 1 Mod PT Treatments $Gait Training: 8-22 mins $Therapeutic Activity: 8-22 mins   PT G Codes:        Leighton Roach, PT  Acute Rehab Services  [place] 08/01/2017, 12:53 PM

## 2017-08-01 NOTE — ED Notes (Signed)
Report given to [id number].

## 2017-08-01 NOTE — Progress Notes (Signed)
PROGRESS NOTE  Sara Griffith CZY:[id number] DOB: [birth year] DOA: 07/31/2017 PCP: Kathyrn Lass, MD  HPI/Recap of past 24 hours:  Denies pain, sitting up in chair  Assessment/Plan: Principal Problem:   Fall Active Problems:   RLS (restless legs syndrome)   GERD (gastroesophageal reflux disease)   HLD (hyperlipidemia)   HTN (hypertension)   Type II diabetes mellitus with renal manifestations ([place])   Left groin pain   Rhabdomyolysis   History of intracranial hemorrhage   Acute renal failure superimposed on stage 3 chronic kidney disease ([place])   Foot ulcer ([place])   Frequent falls ( recent hospitalized for ICH from a fall)  Rhabdomyolysis:  -she fell at her apartment, and lay on the floor for a day -Ck 2296 -IVF -repeat CK in AM  AoCKD-III:  -Baseline Cre is 1.2, pt's Cre is 1.84 and BUN 28 on admission.  -Likely due to prerenal secondary to rhabdo, dehydration, continuation of ARB - IVF as above - Follow up renal function by BMP - Hold cozaar, renal dosing meds  noninsulin dependent Type II diabetes mellitus with renal manifestations ([name]): Last A1c 8.0 on 10/21/16, poorly controled. Patient is taking Tradjenta at home -SSI    Bilateral diabetic foot ulcers Right toe ulcers concerning for underline infection, she reports recently finished  A course of abx for this Mri foot ABI  Code Status: DNR  Family Communication: patient   Disposition Plan: patient prefers go back to independent living at discharge, not sure if safe, will ask PT eval   Consultants:  none  Procedures:  none  Antibiotics:  *   Objective: BP 128/83 (BP Location: Right Arm)   Pulse 82   Temp 97.9 F (36.6 C) (Oral)   Resp 20   Ht 4\' 11"  (1.499 m)   Wt 61.2 kg (135 lb)   LMP  (LMP Unknown)   SpO2 99%   BMI 27.27 kg/m   Intake/Output Summary (Last 24 hours) at 08/01/2017 1226 Last data filed at 08/01/2017 0825 Gross per 24 hour  Intake 500 ml  Output -  Net 500 ml    Filed Weights   07/31/17 1718  Weight: 61.2 kg (135 lb)    Exam: Patient is examined daily including today on 08/01/2017, exams remain the same as of yesterday except that has changed    General:  NAD  Cardiovascular: RRR  Respiratory: CTABL  Abdomen: Soft/ND/NT, positive BS  Musculoskeletal: right big toe with ulceration plantar surface, edematous and erythema entire right big toe. Right foot first MTP planter surface ulcer with scan drainage, no odor, decreased sensation both feet. Left foot deformity  Neuro: alert, oriented   Data Reviewed: Basic Metabolic Panel: Recent Labs  Lab 07/31/17 1919 08/01/17 0500  NA 134* 135  K 4.5 5.0  CL 102 108  CO2 21* 20*  GLUCOSE 123* 120*  BUN 28* 24*  CREATININE 1.84* 1.87*  CALCIUM 9.2 8.3*   Liver Function Tests: Recent Labs  Lab 07/31/17 1919  AST 51*  ALT 23  ALKPHOS 81  BILITOT 0.4  PROT 7.9  ALBUMIN 3.3*   Recent Labs  Lab 07/31/17 1919  LIPASE 36   No results for input(s): AMMONIA in the last 168 hours. CBC: Recent Labs  Lab 07/31/17 1919 08/01/17 0500  WBC 11.8* 8.8  HGB 11.4* 9.5*  HCT 33.7* 28.9*  MCV 88.7 89.8  PLT 299 320   Cardiac Enzymes:   Recent Labs  Lab 07/31/17 1919 08/01/17 0500  CKTOTAL 2,296* 1,794*  BNP (last 3 results) Recent Labs    10/21/16 1437  BNP 295.2*    ProBNP (last 3 results) No results for input(s): PROBNP in the last 8760 hours.  CBG: Recent Labs  Lab 08/01/17 0832 08/01/17 1207  GLUCAP 104* 141*    No results found for this or any previous visit (from the past 240 hour(s)).   Studies: Ct Head Wo Contrast  Result Date: 08/01/2017 CLINICAL DATA:  Fall, ataxia EXAM: CT HEAD WITHOUT CONTRAST TECHNIQUE: Contiguous axial images were obtained from the base of the skull through the vertex without intravenous contrast. COMPARISON:  None. FINDINGS: Brain: No acute territorial infarction. Small amount of extra-axial inter hemispheric hemorrhage  anteriorly. No significant mass effect. Moderate small vessel ischemic changes of the white matter. Moderate atrophy. Old lacunar infarct in the right basal ganglia. Vascular: No hyperdense vessel.  Carotid artery calcification. Skull: No fracture Sinuses/Orbits: Mucosal thickening in the ethmoid sinus. No acute orbital abnormality Other: Left posterior scalp swelling with cutaneous stable IMPRESSION: 1. Small amount of inter hemispheric extra-axial hemorrhage. No midline shift or significant mass effect 2. Atrophy and small vessel ischemic changes of the white matter Critical Value/emergent results were called by telephone at the time of interpretation on 08/01/2017 at 3:06 am to Dr. Tomi Bamberger , who verbally acknowledged these results. Electronically Signed   By: Donavan Foil M.D.   On: 08/01/2017 03:06   Ct Hip Left Wo Contrast  Result Date: 08/01/2017 CLINICAL DATA:  Left hip pain after fall. EXAM: CT OF THE LEFT HIP WITHOUT CONTRAST TECHNIQUE: Multidetector CT imaging of the left hip was performed according to the standard protocol. Multiplanar CT image reconstructions were also generated. COMPARISON:  Radiographs yesterday. FINDINGS: Bones/Joint/Cartilage No fracture, cortical margins are intact. Mild osteoarthritis with joint space narrowing and spurring. No hip joint effusion. Pubic rami are intact. Ligaments Suboptimally assessed by CT. Muscles and Tendons Minimal greater trochanteric enthesopathy. No intramuscular hematoma. Soft tissues No confluent soft tissue hematoma. Prominent left groin lymph nodes are likely reactive. Vascular calcifications are seen. IMPRESSION: 1. No fracture of the left hip. 2. Mild osteoarthritis. Electronically Signed   By: Jeb Levering M.D.   On: 08/01/2017 03:15   Dg Hip Unilat W Or Wo Pelvis 2-3 Views Left  Result Date: 08/01/2017 CLINICAL DATA:  Left hip pain after fall at home 1 week ago. EXAM: DG HIP (WITH OR WITHOUT PELVIS) 2-3V LEFT COMPARISON:  None. FINDINGS:  The cortical margins of the bony pelvis and left hip are intact. No fracture. Pubic symphysis and sacroiliac joints are congruent. Both femoral heads are well-seated in the respective acetabula. IMPRESSION: No fracture of the pelvis or left hip. Electronically Signed   By: Jeb Levering M.D.   On: 08/01/2017 00:30    Scheduled Meds: . amLODipine  5 mg Oral Daily  . atorvastatin  40 mg Oral Daily  . cholecalciferol  1,000 Units Oral Daily  . collagenase  1 application Topical Daily  . folic acid  0.5 mg Oral Daily  . insulin aspart  0-5 Units Subcutaneous QHS  . insulin aspart  0-9 Units Subcutaneous TID WC  . levETIRAcetam  500 mg Oral BID  . metoprolol tartrate  50 mg Oral BID WC  . omega-3 acid ethyl esters  1 g Oral TID  . pantoprazole  40 mg Oral Daily  . pramipexole  0.5 mg Oral QHS  . vitamin E  400 Units Oral Daily    Continuous Infusions: . sodium chloride 125 mL/hr at  08/01/17 1042     Time spent: 35mins from 1pm to 1:35pm I have personally reviewed and interpreted on  08/01/2017 daily labs, imagings as discussed above under date review session and assessment and plans.  I reviewed all nursing notes, pharmacy notes,  vitals, pertinent old records  I have discussed plan of care as described above with RN , patient and family on 08/01/2017   Florencia Reasons MD, PhD  [facility] Pager [phone]. If 7PM-7AM, please contact night-coverage at [email or web address], password [facility] 08/01/2017, 12:26 PM  LOS: 0 days

## 2017-08-01 NOTE — ED Notes (Signed)
ED TO INPATIENT HANDOFF REPORT  Name/Age/Gender Sara Griffith 81 y.o. female  Code Status    Code Status Orders  (From admission, onward)        Start     Ordered   08/01/17 0127  Do not attempt resuscitation (DNR)  Continuous    Question Answer Comment  In the event of cardiac or respiratory ARREST Do not call a "code blue"   In the event of cardiac or respiratory ARREST Do not perform Intubation, CPR, defibrillation or ACLS   In the event of cardiac or respiratory ARREST Use medication by any route, position, wound care, and other measures to relive pain and suffering. May use oxygen, suction and manual treatment of airway obstruction as needed for comfort.      08/01/17 0127    Code Status History    Date Active Date Inactive Code Status Order ID Comments User Context   10/21/2016 17:57 10/22/2016 22:00 Full Code [id number]  Isaiah Serge, NP ED   07/01/2016 19:39 07/04/2016 19:39 Full Code [id number]  ElgergawySilver Huguenin, MD ED   07/01/2016 17:26 07/01/2016 19:39 DNR [id number]  Elgergawy, Silver Huguenin, MD ED    Advance Directive Documentation     Most Recent Value  Type of Advance Directive  Healthcare Power of Attorney, Living will  Pre-existing out of facility DNR order (yellow form or pink MOST form)  No data  "MOST" Form in Place?  No data      Home/SNF/Other Home  Chief Complaint Abdominal Pain  Level of Care/Admitting Diagnosis ED Disposition    ED Disposition Condition Comment   Admit  Hospital Area: [facility] [[id number]]  Level of Care: Med-Surg [16]  Diagnosis: Fall [290176]  Admitting Physician: Ivor Costa Medina  Attending Physician: Ivor Costa [phone]  Estimated length of stay: past midnight tomorrow  Certification:: I certify this patient will need inpatient services for at least 2 midnights  PT Class (Do Not Modify): Inpatient [101]  PT Acc Code (Do Not Modify): Private [1]       Medical History Past Medical History:  Diagnosis  Date  . CKD (chronic kidney disease)   . Diabetes mellitus without complication ([place])   . GERD (gastroesophageal reflux disease)   . HLD (hyperlipidemia)   . Hypertension   . IDA (iron deficiency anemia)   . PVD (peripheral vascular disease) ([place])   . RLS (restless legs syndrome)   . TIA (transient ischemic attack)   . Uses walker   . Vitamin D deficiency     Allergies Allergies  Allergen Reactions  . Cymbalta [Duloxetine Hcl] Swelling  . Librax [Chlordiazepoxide-Clidinium] Swelling  . Lyrica [Pregabalin] Swelling  . Naproxen Swelling    IV Location/Drains/Wounds Patient Lines/Drains/Airways Status   Active Line/Drains/Airways    Name:   Placement date:   Placement time:   Site:   Days:   Peripheral IV 08/01/17 Right Forearm   08/01/17    0102    Forearm   less than 1   Wound / Incision (Open or Dehisced) 10/21/16 Other (Comment) Foot Left   10/21/16    2000    Foot   284   Wound / Incision (Open or Dehisced) 10/21/16 Other (Comment);Non-pressure wound Toe (Comment  which one) Right   10/21/16    2000    Toe (Comment  which one)   284          Labs/Imaging Results for orders placed or performed during the hospital encounter of 07/31/17 (  from the past 48 hour(s))  Lipase, blood     Status: None   Collection Time: 07/31/17  7:19 PM  Result Value Ref Range   Lipase 36 11 - 51 U/L  Comprehensive metabolic panel     Status: Abnormal   Collection Time: 07/31/17  7:19 PM  Result Value Ref Range   Sodium 134 (L) 135 - 145 mmol/L   Potassium 4.5 3.5 - 5.1 mmol/L   Chloride 102 101 - 111 mmol/L   CO2 21 (L) 22 - 32 mmol/L   Glucose, Bld 123 (H) 65 - 99 mg/dL   BUN 28 (H) 6 - 20 mg/dL   Creatinine, Ser 1.84 (H) 0.44 - 1.00 mg/dL   Calcium 9.2 8.9 - 10.3 mg/dL   Total Protein 7.9 6.5 - 8.1 g/dL   Albumin 3.3 (L) 3.5 - 5.0 g/dL   AST 51 (H) 15 - 41 U/L   ALT 23 14 - 54 U/L   Alkaline Phosphatase 81 38 - 126 U/L   Total Bilirubin 0.4 0.3 - 1.2 mg/dL   GFR calc non Af Amer  25 (L) >60 mL/min   GFR calc Af Amer 29 (L) >60 mL/min    Comment: (NOTE) The eGFR has been calculated using the CKD EPI equation. This calculation has not been validated in all clinical situations. eGFR's persistently <60 mL/min signify possible Chronic Kidney Disease.    Anion gap 11 5 - 15  CBC     Status: Abnormal   Collection Time: 07/31/17  7:19 PM  Result Value Ref Range   WBC 11.8 (H) 4.0 - 10.5 K/uL   RBC 3.80 (L) 3.87 - 5.11 MIL/uL   Hemoglobin 11.4 (L) 12.0 - 15.0 g/dL   HCT 33.7 (L) 36.0 - 46.0 %   MCV 88.7 78.0 - 100.0 fL   MCH 30.0 26.0 - 34.0 pg   MCHC 33.8 30.0 - 36.0 g/dL   RDW 13.6 11.5 - 15.5 %   Platelets 299 150 - 400 K/uL  CK     Status: Abnormal   Collection Time: 07/31/17  7:19 PM  Result Value Ref Range   Total CK 2,296 (H) 38 - 234 U/L  Urinalysis, Routine w reflex microscopic     Status: Abnormal   Collection Time: 07/31/17  7:30 PM  Result Value Ref Range   Color, Urine YELLOW YELLOW   APPearance CLEAR CLEAR   Specific Gravity, Urine 1.017 1.005 - 1.030   pH 6.0 5.0 - 8.0   Glucose, UA NEGATIVE NEGATIVE mg/dL   Hgb urine dipstick NEGATIVE NEGATIVE   Bilirubin Urine NEGATIVE NEGATIVE   Ketones, ur NEGATIVE NEGATIVE mg/dL   Protein, ur 30 (A) NEGATIVE mg/dL   Nitrite NEGATIVE NEGATIVE   Leukocytes, UA NEGATIVE NEGATIVE   RBC / HPF 0-5 0 - 5 RBC/hpf   WBC, UA 0-5 0 - 5 WBC/hpf   Bacteria, UA RARE (A) NONE SEEN   Squamous Epithelial / LPF 0-5 (A) NONE SEEN  CK     Status: Abnormal   Collection Time: 08/01/17  5:00 AM  Result Value Ref Range   Total CK 1,794 (H) 38 - 234 U/L  Basic metabolic panel     Status: Abnormal   Collection Time: 08/01/17  5:00 AM  Result Value Ref Range   Sodium 135 135 - 145 mmol/L   Potassium 5.0 3.5 - 5.1 mmol/L   Chloride 108 101 - 111 mmol/L   CO2 20 (L) 22 - 32 mmol/L   Glucose,  Bld 120 (H) 65 - 99 mg/dL   BUN 24 (H) 6 - 20 mg/dL   Creatinine, Ser 1.87 (H) 0.44 - 1.00 mg/dL   Calcium 8.3 (L) 8.9 - 10.3  mg/dL   GFR calc non Af Amer 24 (L) >60 mL/min   GFR calc Af Amer 28 (L) >60 mL/min    Comment: (NOTE) The eGFR has been calculated using the CKD EPI equation. This calculation has not been validated in all clinical situations. eGFR's persistently <60 mL/min signify possible Chronic Kidney Disease.    Anion gap 7 5 - 15  CBC     Status: Abnormal   Collection Time: 08/01/17  5:00 AM  Result Value Ref Range   WBC 8.8 4.0 - 10.5 K/uL   RBC 3.22 (L) 3.87 - 5.11 MIL/uL   Hemoglobin 9.5 (L) 12.0 - 15.0 g/dL   HCT 28.9 (L) 36.0 - 46.0 %   MCV 89.8 78.0 - 100.0 fL   MCH 29.5 26.0 - 34.0 pg   MCHC 32.9 30.0 - 36.0 g/dL   RDW 13.9 11.5 - 15.5 %   Platelets 320 150 - 400 K/uL   Ct Head Wo Contrast  Result Date: 08/01/2017 CLINICAL DATA:  Fall, ataxia EXAM: CT HEAD WITHOUT CONTRAST TECHNIQUE: Contiguous axial images were obtained from the base of the skull through the vertex without intravenous contrast. COMPARISON:  None. FINDINGS: Brain: No acute territorial infarction. Small amount of extra-axial inter hemispheric hemorrhage anteriorly. No significant mass effect. Moderate small vessel ischemic changes of the white matter. Moderate atrophy. Old lacunar infarct in the right basal ganglia. Vascular: No hyperdense vessel.  Carotid artery calcification. Skull: No fracture Sinuses/Orbits: Mucosal thickening in the ethmoid sinus. No acute orbital abnormality Other: Left posterior scalp swelling with cutaneous stable IMPRESSION: 1. Small amount of inter hemispheric extra-axial hemorrhage. No midline shift or significant mass effect 2. Atrophy and small vessel ischemic changes of the white matter Critical Value/emergent results were called by telephone at the time of interpretation on 08/01/2017 at 3:06 am to Dr. Tomi Bamberger , who verbally acknowledged these results. Electronically Signed   By: Donavan Foil M.D.   On: 08/01/2017 03:06   Ct Hip Left Wo Contrast  Result Date: 08/01/2017 CLINICAL DATA:  Left hip  pain after fall. EXAM: CT OF THE LEFT HIP WITHOUT CONTRAST TECHNIQUE: Multidetector CT imaging of the left hip was performed according to the standard protocol. Multiplanar CT image reconstructions were also generated. COMPARISON:  Radiographs yesterday. FINDINGS: Bones/Joint/Cartilage No fracture, cortical margins are intact. Mild osteoarthritis with joint space narrowing and spurring. No hip joint effusion. Pubic rami are intact. Ligaments Suboptimally assessed by CT. Muscles and Tendons Minimal greater trochanteric enthesopathy. No intramuscular hematoma. Soft tissues No confluent soft tissue hematoma. Prominent left groin lymph nodes are likely reactive. Vascular calcifications are seen. IMPRESSION: 1. No fracture of the left hip. 2. Mild osteoarthritis. Electronically Signed   By: Jeb Levering M.D.   On: 08/01/2017 03:15   Dg Hip Unilat W Or Wo Pelvis 2-3 Views Left  Result Date: 08/01/2017 CLINICAL DATA:  Left hip pain after fall at home 1 week ago. EXAM: DG HIP (WITH OR WITHOUT PELVIS) 2-3V LEFT COMPARISON:  None. FINDINGS: The cortical margins of the bony pelvis and left hip are intact. No fracture. Pubic symphysis and sacroiliac joints are congruent. Both femoral heads are well-seated in the respective acetabula. IMPRESSION: No fracture of the pelvis or left hip. Electronically Signed   By: Fonnie Birkenhead.D.  On: 08/01/2017 00:30    Pending Labs Unresulted Labs (From admission, onward)   None      Vitals/Pain Today's Vitals   08/01/17 0500 08/01/17 0530 08/01/17 0600 08/01/17 0700  BP: 132/64 (!) 119/58 (!) 140/56 119/64  Pulse:   92 76  Resp: '20 19 20 '$ (!) 23  Temp:   98.2 F (36.8 C)   TempSrc:   Oral   SpO2:   96%   Weight:      Height:      PainSc:        Isolation Precautions No active isolations  Medications Medications  0.9 %  sodium chloride infusion ( Intravenous New Bag/Given 08/01/17 0204)  amLODipine (NORVASC) tablet 5 mg (not administered)  atorvastatin  (LIPITOR) tablet 40 mg (not administered)  cholecalciferol (VITAMIN D) tablet 1,000 Units (not administered)  collagenase (SANTYL) ointment 1 application (not administered)  folic acid (FOLVITE) tablet 0.5 mg (not administered)  levETIRAcetam (KEPPRA) tablet 500 mg (500 mg Oral Given 08/01/17 0130)  metoprolol tartrate (LOPRESSOR) tablet 50 mg (not administered)  omega-3 acid ethyl esters (LOVAZA) capsule 1 g (not administered)  pantoprazole (PROTONIX) EC tablet 40 mg (not administered)  pramipexole (MIRAPEX) tablet 0.5 mg (0.5 mg Oral Not Given 08/01/17 0130)  psyllium (HYDROCIL/METAMUCIL) packet 1 packet (not administered)  vitamin E capsule 400 Units (not administered)  oxyCODONE (Oxy IR/ROXICODONE) immediate release tablet 5 mg (not administered)  insulin aspart (novoLOG) injection 0-9 Units (not administered)  insulin aspart (novoLOG) injection 0-5 Units (0 Units Subcutaneous Not Given 08/01/17 0130)  ondansetron (ZOFRAN) tablet 4 mg (not administered)    Or  ondansetron (ZOFRAN) injection 4 mg (not administered)  hydrALAZINE (APRESOLINE) injection 5 mg (not administered)  zolpidem (AMBIEN) tablet 5 mg (not administered)  sodium chloride 0.9 % bolus 500 mL (0 mLs Intravenous Stopped 08/01/17 0203)    Mobility manual wheelchair

## 2017-08-01 NOTE — ED Notes (Addendum)
Per Dr. Blaine Hamper, "DO NOT REMOVE STAPLES"----- pt has recent scalp laceration repair with staple closure.

## 2017-08-01 NOTE — H&P (Signed)
History and Physical    Shaiann Mcmanamon IRC:[id number] DOB: 03-13-1936 DOA: 07/31/2017  Referring MD/NP/PA:   PCP: Kathyrn Lass, MD   Patient coming from:  The patient is coming from independent living facility.  At baseline, pt is independent for most of ADL.  Chief Complaint: fall and left groin pain  HPI: Nohea Senk is a 81 y.o. female with medical history significant of hypertension, hyperlipidemia, diabetes mellitus, TIA, GERD, RLS, PVD, CKD-III, recent fall which caused small ICH, who presents with fall and left groin pain.   Pt states that she slid off the couch when she was trying to go to the bathroom around 7 AM. She could not get up until she was found by the home health nurse who visited her this afternoon. She states that she has pain in the left groin area which is constant, sharp, 5 out of 10 in severity, nonradiating. Patient denies LOC. She does not have unilateral weakness, numbness or tingling to extremities, no facial droop, slurred speech, vision change or hearing loss. Patient states that she has bilateral lower extremity tingling due to peripheral neuropathy, which has not changed. Patient does not have chest pain, shortness breath, cough, nausea, vomiting, diarrhea or abdominal pain. No symptoms of UTI. She states that she has bilateral foot ulcer, and complicated 2 rounds of antibiotics recently. Currently no fever or chills.   Of note, patient fell November 23 while visiting family and was admitted to the hospital in [place] where she had a scalp laceration and small ICH. She was admitted to the ICU and discharged back to her facility.  Daughter states her mother's mental status seems baseline.    ED Course: pt was found to have WBC 11.8, lipase 36, CK 2296, negative urinalysis, worsening renal function, temperature normal, no tachycardia, no tachypnea, oxygen saturation 96% on room air. CT of left hip is negative for bony fracture. Patient is admitted to [facility] bed as  inpatient.  CT-head showed 1. Small amount of inter hemispheric extra-axial hemorrhage. Nomidline shift or significant mass effect 2. Atrophy and small vessel ischemic changes of the white matter  Review of Systems:   General: no fevers, chills, no body weight gain, has poor appetite, has fatigue HEENT: no blurry vision, hearing changes or sore throat Respiratory: no dyspnea, coughing, wheezing CV: no chest pain, no palpitations GI: no nausea, vomiting, abdominal pain, diarrhea, constipation GU: no dysuria, burning on urination, increased urinary frequency, hematuria  Ext: no leg edema Neuro: no unilateral weakness, numbness, or tingling, no vision change or hearing loss. had fall. Skin: had laceration of scalp occipital area, with staples in place. MSK: has left groin pain. Heme: No easy bruising.  Travel history: No recent long distant travel.  Allergy:  Allergies  Allergen Reactions  . Cymbalta [Duloxetine Hcl] Swelling  . Librax [Chlordiazepoxide-Clidinium] Swelling  . Lyrica [Pregabalin] Swelling  . Naproxen Swelling    Past Medical History:  Diagnosis Date  . CKD (chronic kidney disease)   . Diabetes mellitus without complication ([place])   . GERD (gastroesophageal reflux disease)   . HLD (hyperlipidemia)   . Hypertension   . IDA (iron deficiency anemia)   . PVD (peripheral vascular disease) ([place])   . RLS (restless legs syndrome)   . TIA (transient ischemic attack)   . Uses walker   . Vitamin D deficiency     Past Surgical History:  Procedure Laterality Date  . ABDOMINAL HYSTERECTOMY     with bladder tac  . APPENDECTOMY    .  CARPAL TUNNEL RELEASE Right   . CATARACT EXTRACTION, BILATERAL    . CERVICAL DISC SURGERY    . COLONOSCOPY    . DILATION AND CURETTAGE OF UTERUS     x 2  . left great toe amputation    . ROTATOR CUFF REPAIR Left     Social History:  reports that she has quit smoking. Her smoking use included cigarettes. She quit after 30.00 years of  use. She quit smokeless tobacco use about 63 years ago. Her smokeless tobacco use included snuff. She reports that she does not drink alcohol or use drugs.  Family History:  Family History  Problem Relation Age of Onset  . Hypertension Mother   . Cancer Father   . Arrhythmia Sister        ppm  . Arrhythmia Brother        ppm     Prior to Admission medications   Medication Sig Start Date End Date Taking? Authorizing Provider  acetaminophen (TYLENOL) 325 MG tablet Take 650 mg by mouth every 6 (six) hours as needed for mild pain, moderate pain or headache.   Yes [provider]  amLODipine (NORVASC) 5 MG tablet TAKE 1 TABLET (5 MG TOTAL) BY MOUTH DAILY. 04/01/17  Yes Bhagat, Bhavinkumar, PA  atorvastatin (LIPITOR) 40 MG tablet Take 40 mg by mouth daily.   Yes [provider]  cholecalciferol (VITAMIN D) 1000 units tablet Take 1,000 Units by mouth daily.   Yes [provider]  collagenase (SANTYL) ointment Apply 1 application topically daily.   Yes [provider]  folic acid (FOLVITE) 1 MG tablet Take 0.5 mg by mouth daily.   Yes [provider]  levETIRAcetam (KEPPRA) 500 MG tablet Take 500 mg by mouth 2 (two) times daily.   Yes [provider]  linagliptin (TRADJENTA) 5 MG TABS tablet Take 5 mg by mouth daily.   Yes [provider]  losartan (COZAAR) 100 MG tablet Take 100 mg by mouth daily. 05/30/16  Yes [provider]  metoprolol tartrate (LOPRESSOR) 50 MG tablet Take 1 tablet (50 mg total) by mouth 2 (two) times daily with a meal. 07/04/16  Yes Eugenie Filler, MD  omega-3 acid ethyl esters (LOVAZA) 1 g capsule Take 1 g by mouth 3 (three) times daily.  06/28/16  Yes [provider]  omeprazole (PRILOSEC) 40 MG capsule Take 40 mg by mouth daily. 06/28/16  Yes [provider]  OVER THE COUNTER MEDICATION Place 1 drop into both eyes as needed (dry eyes). Over the counter dry eye drops.   Yes [provider]  pramipexole (MIRAPEX) 0.5 MG tablet Take 0.5 mg by mouth at bedtime.   Yes [provider]  psyllium (METAMUCIL) 58.6 % packet Take 1 packet by mouth daily as needed (constipation).   Yes [provider]  traMADol (ULTRAM) 50 MG tablet Take 50 mg by mouth 3 (three) times daily. 06/05/16  Yes [provider]  vitamin E 400 UNIT capsule Take 400 Units by mouth daily.   Yes [provider]    Physical Exam: Vitals:   07/31/17 1718 07/31/17 2342 08/01/17 0131  BP: (!) 142/52 (!) 149/48 128/65  Pulse: 74 75 88  Resp: 18 17 17   Temp: 98.3 F (36.8 C)    TempSrc: Oral    SpO2: 98% 96% 98%  Weight: 61.2 kg (135 lb)    Height: 4\' 11"  (1.499 m)     General: Not in acute distress HEENT:  Eyes: PERRL, EOMI, no scleral icterus.       ENT: No discharge from the ears and nose, no pharynx injection, no tonsillar enlargement.        Neck: No JVD, no bruit, no mass felt. Heme: No neck lymph node enlargement. Cardiac: S1/S2, RRR, No murmurs, No gallops or rubs. Respiratory: No rales, wheezing, rhonchi or rubs. GI: Soft, nondistended, nontender, no rebound pain, no organomegaly, BS present. GU: No hematuria Ext: No pitting leg edema bilaterally. 2+DP/PT pulse bilaterally. Pt has ulcer in the front plantar area of left foot and ulcer in right great toe. Musculoskeletal: No joint deformities, No joint redness or warmth, no limitation of ROM in spin. Skin: No rashes.  had laceration of scalp occipital area, with staples in place. Neuro: Alert, oriented X3, cranial nerves II-XII grossly intact, moves all extremities normally.  Psych: Patient is not psychotic, no suicidal or hemocidal ideation.  Labs on Admission: I have personally reviewed following labs and imaging studies  CBC: Recent Labs  Lab 07/31/17 1919  WBC 11.8*  HGB 11.4*  HCT 33.7*  MCV 88.7  PLT 825   Basic Metabolic Panel: Recent Labs  Lab 07/31/17 1919  NA 134*  K 4.5    CL 102  CO2 21*  GLUCOSE 123*  BUN 28*  CREATININE 1.84*  CALCIUM 9.2   GFR: Estimated Creatinine Clearance: 19.7 mL/min (A) (by C-G formula based on SCr of 1.84 mg/dL (H)). Liver Function Tests: Recent Labs  Lab 07/31/17 1919  AST 51*  ALT 23  ALKPHOS 81  BILITOT 0.4  PROT 7.9  ALBUMIN 3.3*   Recent Labs  Lab 07/31/17 1919  LIPASE 36   No results for input(s): AMMONIA in the last 168 hours. Coagulation Profile: No results for input(s): INR, PROTIME in the last 168 hours. Cardiac Enzymes: Recent Labs  Lab 07/31/17 1919  CKTOTAL 2,296*   BNP (last 3 results) No results for input(s): PROBNP in the last 8760 hours. HbA1C: No results for input(s): HGBA1C in the last 72 hours. CBG: No results for input(s): GLUCAP in the last 168 hours. Lipid Profile: No results for input(s): CHOL, HDL, LDLCALC, TRIG, CHOLHDL, LDLDIRECT in the last 72 hours. Thyroid Function Tests: No results for input(s): TSH, T4TOTAL, FREET4, T3FREE, THYROIDAB in the last 72 hours. Anemia Panel: No results for input(s): VITAMINB12, FOLATE, FERRITIN, TIBC, IRON, RETICCTPCT in the last 72 hours. Urine analysis:    Component Value Date/Time   COLORURINE YELLOW 07/31/2017 1930   APPEARANCEUR CLEAR 07/31/2017 1930   LABSPEC 1.017 07/31/2017 1930   PHURINE 6.0 07/31/2017 1930   GLUCOSEU NEGATIVE 07/31/2017 1930   HGBUR NEGATIVE 07/31/2017 [place] NEGATIVE 07/31/2017 [place] NEGATIVE 07/31/2017 1930   PROTEINUR 30 (A) 07/31/2017 1930   NITRITE NEGATIVE 07/31/2017 1930   LEUKOCYTESUR NEGATIVE 07/31/2017 1930   Sepsis Labs: @LABRCNTIP (procalcitonin:4,lacticidven:4) )No results found for this or any previous visit (from the past 240 hour(s)).   Radiological Exams on Admission: Ct Head Wo Contrast  Result Date: 08/01/2017 CLINICAL DATA:  Fall, ataxia EXAM: CT HEAD WITHOUT CONTRAST TECHNIQUE: Contiguous axial images were obtained from the base of the skull through the vertex  without intravenous contrast. COMPARISON:  None. FINDINGS: Brain: No acute territorial infarction. Small amount of extra-axial inter hemispheric hemorrhage anteriorly. No significant mass effect. Moderate small vessel ischemic changes of the white matter. Moderate atrophy. Old lacunar infarct in the right basal ganglia. Vascular: No hyperdense vessel.  Carotid artery calcification. Skull: No fracture Sinuses/Orbits:  Mucosal thickening in the ethmoid sinus. No acute orbital abnormality Other: Left posterior scalp swelling with cutaneous stable IMPRESSION: 1. Small amount of inter hemispheric extra-axial hemorrhage. No midline shift or significant mass effect 2. Atrophy and small vessel ischemic changes of the white matter Critical Value/emergent results were called by telephone at the time of interpretation on 08/01/2017 at 3:06 am to Dr. Tomi Bamberger , who verbally acknowledged these results. Electronically Signed   By: Donavan Foil M.D.   On: 08/01/2017 03:06   Ct Hip Left Wo Contrast  Result Date: 08/01/2017 CLINICAL DATA:  Left hip pain after fall. EXAM: CT OF THE LEFT HIP WITHOUT CONTRAST TECHNIQUE: Multidetector CT imaging of the left hip was performed according to the standard protocol. Multiplanar CT image reconstructions were also generated. COMPARISON:  Radiographs yesterday. FINDINGS: Bones/Joint/Cartilage No fracture, cortical margins are intact. Mild osteoarthritis with joint space narrowing and spurring. No hip joint effusion. Pubic rami are intact. Ligaments Suboptimally assessed by CT. Muscles and Tendons Minimal greater trochanteric enthesopathy. No intramuscular hematoma. Soft tissues No confluent soft tissue hematoma. Prominent left groin lymph nodes are likely reactive. Vascular calcifications are seen. IMPRESSION: 1. No fracture of the left hip. 2. Mild osteoarthritis. Electronically Signed   By: Jeb Levering M.D.   On: 08/01/2017 03:15   Dg Hip Unilat W Or Wo Pelvis 2-3 Views Left  Result  Date: 08/01/2017 CLINICAL DATA:  Left hip pain after fall at home 1 week ago. EXAM: DG HIP (WITH OR WITHOUT PELVIS) 2-3V LEFT COMPARISON:  None. FINDINGS: The cortical margins of the bony pelvis and left hip are intact. No fracture. Pubic symphysis and sacroiliac joints are congruent. Both femoral heads are well-seated in the respective acetabula. IMPRESSION: No fracture of the pelvis or left hip. Electronically Signed   By: Jeb Levering M.D.   On: 08/01/2017 00:30     EKG:   Not done in ED, will get one.   Assessment/Plan Principal Problem:   Fall Active Problems:   RLS (restless legs syndrome)   GERD (gastroesophageal reflux disease)   HLD (hyperlipidemia)   HTN (hypertension)   Type II diabetes mellitus with renal manifestations (HCC)   Left groin pain   Rhabdomyolysis   History of intracranial hemorrhage   Acute renal failure superimposed on stage 3 chronic kidney disease ([place])   Foot ulcer ([place])   Fall: CT head showed small amount of inter hemispheric extra-axial hemorrhage which is likely from recent fall. No midline shift or significant mass effect.   patient's mental status is normal. No focal neurologic findings. -Admit to MedSurg bed as inpatient -Fall precaution -PT OT  Recent ICH: -continue Keppra for seizure ppx  Left groin pain: likely due to muscle straining. CT of left hip is negative for fracture. -When necessary oxycodone for pain  GERD: -Protonix  RLS (restless legs syndrome): -Pramipexole  HLD: -lipitor  HTN:  -Continue home medications: Amlodipine, metoprolol - hold Cozaar due to worsening renal function -IV hydralazine prn  Type II diabetes mellitus with renal manifestations ([place]): Last A1c 8.0 on 10/21/16, poorly controled. Patient is taking Tradjenta at home -SSI  Rhabdomyolysis: Ck 2296 -IVF: 500 cc NS, then 125 cc/h -repeat CK in AM  AoCKD-III: Baseline Cre is 1.2, pt's Cre is 1.84 and BUN 28 on admission. Likely due to prerenal  secondary to dehydration and continuation of ARB - IVF as above - Follow up renal function by BMP - Hold cozarr  Foot ulcer ([place]): completred 2 course of Abx recently -  wound care consult   DVT ppx: SCD Code Status: DNR (I discussed with patient in the presence of her daughter who is her POA, and explained the meaning of CODE STATUS. Patient wants to be DNR) Family Communication: Yes, patient's daughter at bed side Disposition Plan:  Anticipate discharge back to previous independent living facility  Consults called:  none Admission status:  Inpatient/tele     Date of Service 08/01/2017    Ivor Costa [facility] Pager [phone]  If 7PM-7AM, please contact night-coverage [email or web address] Password [facility] 08/01/2017, 4:38 AM

## 2017-08-02 ENCOUNTER — Inpatient Hospital Stay (HOSPITAL_COMMUNITY): Payer: Medicare HMO

## 2017-08-02 DIAGNOSIS — E1121 Type 2 diabetes mellitus with diabetic nephropathy: Secondary | ICD-10-CM

## 2017-08-02 DIAGNOSIS — M86171 Other acute osteomyelitis, right ankle and foot: Secondary | ICD-10-CM

## 2017-08-02 LAB — COMPREHENSIVE METABOLIC PANEL
ALK PHOS: 64 U/L (ref 38–126)
ALT: 20 U/L (ref 14–54)
ANION GAP: 8 (ref 5–15)
AST: 44 U/L — ABNORMAL HIGH (ref 15–41)
Albumin: 2.6 g/dL — ABNORMAL LOW (ref 3.5–5.0)
BUN: 18 mg/dL (ref 6–20)
CALCIUM: 8.3 mg/dL — AB (ref 8.9–10.3)
CO2: 20 mmol/L — AB (ref 22–32)
CREATININE: 1.49 mg/dL — AB (ref 0.44–1.00)
Chloride: 107 mmol/L (ref 101–111)
GFR, EST AFRICAN AMERICAN: 37 mL/min — AB (ref >60)
GFR, EST NON AFRICAN AMERICAN: 32 mL/min — AB (ref >60)
Glucose, Bld: 128 mg/dL — ABNORMAL HIGH (ref 65–99)
Potassium: 4.2 mmol/L (ref 3.5–5.1)
SODIUM: 135 mmol/L (ref 135–145)
Total Bilirubin: 0.5 mg/dL (ref 0.3–1.2)
Total Protein: 6.6 g/dL (ref 6.5–8.1)

## 2017-08-02 LAB — CBC WITH DIFFERENTIAL/PLATELET
Basophils Absolute: 0 10*3/uL (ref 0.0–0.1)
Basophils Relative: 0 %
EOS ABS: 0.3 10*3/uL (ref 0.0–0.7)
EOS PCT: 3 %
HCT: 29.1 % — ABNORMAL LOW (ref 36.0–46.0)
HEMOGLOBIN: 9.6 g/dL — AB (ref 12.0–15.0)
LYMPHS ABS: 2.1 10*3/uL (ref 0.7–4.0)
LYMPHS PCT: 23 %
MCH: 29.6 pg (ref 26.0–34.0)
MCHC: 33 g/dL (ref 30.0–36.0)
MCV: 89.8 fL (ref 78.0–100.0)
MONOS PCT: 8 %
Monocytes Absolute: 0.7 10*3/uL (ref 0.1–1.0)
Neutro Abs: 5.8 10*3/uL (ref 1.7–7.7)
Neutrophils Relative %: 66 %
PLATELETS: 294 10*3/uL (ref 150–400)
RBC: 3.24 MIL/uL — ABNORMAL LOW (ref 3.87–5.11)
RDW: 13.8 % (ref 11.5–15.5)
WBC: 8.9 10*3/uL (ref 4.0–10.5)

## 2017-08-02 LAB — CK: CK TOTAL: 1514 U/L — AB (ref 38–234)

## 2017-08-02 LAB — GLUCOSE, CAPILLARY
GLUCOSE-CAPILLARY: 133 mg/dL — AB (ref 65–99)
GLUCOSE-CAPILLARY: 192 mg/dL — AB (ref 65–99)
GLUCOSE-CAPILLARY: 133 mg/dL — AB (ref 65–99)
Glucose-Capillary: 115 mg/dL — ABNORMAL HIGH (ref 65–99)

## 2017-08-02 MED ORDER — VANCOMYCIN HCL IN DEXTROSE 750-5 MG/150ML-% IV SOLN
750.0000 mg | INTRAVENOUS | Status: AC
  Administered 2017-08-04 – 2017-08-08 (×3): 750 mg via INTRAVENOUS
  Filled 2017-08-02 (×3): qty 150

## 2017-08-02 MED ORDER — PIPERACILLIN-TAZOBACTAM 3.375 G IVPB
3.3750 g | Freq: Three times a day (TID) | INTRAVENOUS | Status: AC
  Administered 2017-08-02 – 2017-08-08 (×18): 3.375 g via INTRAVENOUS
  Filled 2017-08-02 (×20): qty 50

## 2017-08-02 MED ORDER — VANCOMYCIN HCL 10 G IV SOLR
1250.0000 mg | Freq: Once | INTRAVENOUS | Status: AC
  Administered 2017-08-02: 1250 mg via INTRAVENOUS
  Filled 2017-08-02: qty 1250

## 2017-08-02 NOTE — Progress Notes (Signed)
Pharmacy Antibiotic Note  Tracye Sur is a 81 y.o. female admitted on 07/31/2017 with bilateral diabetic foot ulcers with concern for osteomyelitis. Pharmacy has been consulted for vancomycin and Zosyn dosing.  Plan: Vancomycin 1250 mg loading dose then 750 mg IV q48h for AUC goal 400-500. Zosyn 3.375g IV q8h (4 hour infusion time).  F/u SCr for dose adjustments.  Height: 4\' 11"  (149.9 cm) Weight: 135 lb (61.2 kg) IBW/kg (Calculated) : 43.2  Temp (24hrs), Avg:98.3 F (36.8 C), Min:98.2 F (36.8 C), Max:98.5 F (36.9 C)  Recent Labs  Lab 07/31/17 1919 08/01/17 0500 08/02/17 0554  WBC 11.8* 8.8 8.9  CREATININE 1.84* 1.87* 1.49*    Estimated Creatinine Clearance: 24.4 mL/min (A) (by C-G formula based on SCr of 1.49 mg/dL (H)).    Allergies  Allergen Reactions  . Cymbalta [Duloxetine Hcl] Swelling  . Librax [Chlordiazepoxide-Clidinium] Swelling  . Lyrica [Pregabalin] Swelling  . Naproxen Swelling    Antimicrobials this admission: 12/2 Zosyn >>  12/2 Vancomycin >>   Dose adjustments this admission:   Microbiology results:  Thank you for allowing pharmacy to be a part of this patient's care.  Hershal Coria 08/02/2017 5:19 PM

## 2017-08-02 NOTE — Progress Notes (Addendum)
PROGRESS NOTE  Sara Griffith UTM:[id number] DOB: 11/16/1935 DOA: 07/31/2017 PCP: Kathyrn Lass, MD  HPI/Recap of past 24 hours:  Denies pain, sitting up in chair  daughter at bedside  Assessment/Plan: Principal Problem:   Fall Active Problems:   RLS (restless legs syndrome)   GERD (gastroesophageal reflux disease)   HLD (hyperlipidemia)   HTN (hypertension)   Type II diabetes mellitus with renal manifestations ([place])   Left groin pain   Rhabdomyolysis   History of intracranial hemorrhage   Acute renal failure superimposed on stage 3 chronic kidney disease (HCC)   Ulcer of toe of right foot ([place])   Frequent falls ( recent hospitalized for ICH from a fall)  Rhabdomyolysis:  -she fell at her apartment, and lay on the floor for a day -CK 2296 on presentation,  -ck improving on IVF -repeat CK in AM  AoCKD-III:  -Baseline Cre is 1.2, pt's Cre is 1.84 and BUN 28 on admission.  -Likely due to prerenal secondary to rhabdo, dehydration, continuation of ARB - IVF as above -  Hold cozaar, renal dosing meds -cr improving  noninsulin dependent Type II diabetes mellitus with renal manifestations ([place]): Last A1c 8.0 on 10/21/16, poorly controled. Patient is taking Tradjenta at home -SSI    Bilateral diabetic foot ulcers Right toe ulcers concerning for underline infection, she reports recently finished  A course of abx for this Mri foot confirmed "osteomyelitis of the first distal phalanx", start vanc/zosyn Repeat left foot mri ( poor quality due to movement on first attempt) ABI pending Ortho dr Veverly Fells paged  Code Status: DNR  Family Communication: patient and daughter who is a nurse anesthesiologist   Disposition Plan: patient prefers go back to independent living at discharge, not sure if safe, will ask PT eval   Consultants:  ortho  Procedures:  none  Antibiotics:  none   Objective: BP 121/64 (BP Location: Right Arm)   Pulse 81   Temp 98.2 F (36.8 C)  (Oral)   Resp 17   Ht 4\' 11"  (1.499 m)   Wt 61.2 kg (135 lb)   LMP  (LMP Unknown)   SpO2 95%   BMI 27.27 kg/m   Intake/Output Summary (Last 24 hours) at 08/02/2017 5681 Last data filed at 08/02/2017 0551 Gross per 24 hour  Intake 4014.58 ml  Output 1425 ml  Net 2589.58 ml   Filed Weights   07/31/17 1718  Weight: 61.2 kg (135 lb)    Exam: Patient is examined daily including today on 08/02/2017, exams remain the same as of yesterday except that has changed    General:  NAD  Cardiovascular: RRR  Respiratory: CTABL  Abdomen: Soft/ND/NT, positive BS  Musculoskeletal: right big toe with ulceration plantar surface, edematous and erythema entire right big toe. Right foot first MTP planter surface ulcer with scan drainage, no odor, decreased sensation both feet. Left foot deformity  Neuro: alert, oriented   Data Reviewed: Basic Metabolic Panel: Recent Labs  Lab 07/31/17 1919 08/01/17 0500 08/02/17 0554  NA 134* 135 135  K 4.5 5.0 4.2  CL 102 108 107  CO2 21* 20* 20*  GLUCOSE 123* 120* 128*  BUN 28* 24* 18  CREATININE 1.84* 1.87* 1.49*  CALCIUM 9.2 8.3* 8.3*   Liver Function Tests: Recent Labs  Lab 07/31/17 1919 08/02/17 0554  AST 51* 44*  ALT 23 20  ALKPHOS 81 64  BILITOT 0.4 0.5  PROT 7.9 6.6  ALBUMIN 3.3* 2.6*   Recent Labs  Lab 07/31/17 1919  LIPASE 36   No results for input(s): AMMONIA in the last 168 hours. CBC: Recent Labs  Lab 07/31/17 1919 08/01/17 0500 08/02/17 0554  WBC 11.8* 8.8 8.9  NEUTROABS  --   --  5.8  HGB 11.4* 9.5* 9.6*  HCT 33.7* 28.9* 29.1*  MCV 88.7 89.8 89.8  PLT 299 320 294   Cardiac Enzymes:   Recent Labs  Lab 07/31/17 1919 08/01/17 0500 08/02/17 0554  CKTOTAL 2,296* 1,794* 1,514*   BNP (last 3 results) Recent Labs    10/21/16 1437  BNP 295.2*    ProBNP (last 3 results) No results for input(s): PROBNP in the last 8760 hours.  CBG: Recent Labs  Lab 08/01/17 0832 08/01/17 1207 08/01/17 1653  08/01/17 2104 08/02/17 0719  GLUCAP 104* 141* 122* 163* 133*    No results found for this or any previous visit (from the past 240 hour(s)).   Studies: Mr Foot Right Wo Contrast  Result Date: 08/01/2017 CLINICAL DATA:  Bilateral foot pain near toes. Evaluate for osteomyelitis. EXAM: MRI OF THE RIGHT FOREFOOT WITHOUT CONTRAST TECHNIQUE: Multiplanar, multisequence MR imaging of the right forefoot was performed. No intravenous contrast was administered. COMPARISON:  None. FINDINGS: Bones/Joint/Cartilage Increased STIR signal with corresponding decreased T1 marrow signal in the first distal phalanx. Remaining marrow signal is preserved. No acute fracture or malalignment. Muscles and Tendons Moderate fatty atrophy and edema within the intrinsic muscles of the forefoot, as can be seen with diabetic muscle changes. No evidence of tenosynovitis. Soft tissues Soft tissue ulceration along the plantar tip of the great toe. No drainable fluid collection. IMPRESSION: 1. Soft tissue ulceration along the plantar tip of the great toe with underlying osteomyelitis of the first distal phalanx. Electronically Signed   By: Titus Dubin M.D.   On: 08/01/2017 15:36   Mr Foot Left Wo Contrast  Result Date: 08/01/2017 CLINICAL DATA:  Bilateral foot pain.  Evaluate for osteomyelitis. EXAM: MRI OF THE LEFT FOOT WITHOUT CONTRAST TECHNIQUE: Multiplanar, multisequence MR imaging of the left forefoot was performed. No intravenous contrast was administered. COMPARISON:  None. FINDINGS: Despite efforts by the technologist and patient, motion artifact is present on today's exam and could not be eliminated. This reduces exam sensitivity and specificity. Bones/Joint/Cartilage Prior amputation of the great toe to the level of the proximal metatarsal. No definite bone marrow signal abnormality to suggest osteomyelitis. No acute fracture or malalignment. Muscles and Tendons Diffuse fatty atrophy of the intrinsic muscles of the  forefoot. No definite evidence of tenosynovitis. Soft tissues No drainable fluid collection. IMPRESSION: 1. Extremely limited study due to motion artifact. No definite evidence of osteomyelitis or drainable fluid collection. Consider repeating the study when the patient is better able to tolerate it. 2. 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 Electronically Signed   By: Titus Dubin M.D.   On: 08/01/2017 15:16    Scheduled Meds: . amLODipine  5 mg Oral Daily  . atorvastatin  40 mg Oral Daily  . cholecalciferol  1,000 Units Oral Daily  . collagenase  1 application Topical Daily  . folic acid  0.5 mg Oral Daily  . insulin aspart  0-5 Units Subcutaneous QHS  . insulin aspart  0-9 Units Subcutaneous TID WC  . levETIRAcetam  500 mg Oral BID  . metoprolol tartrate  50 mg Oral BID WC  . omega-3 acid ethyl esters  1 g Oral TID  . pantoprazole  40 mg Oral Daily  . pramipexole  0.5 mg  Oral QHS  . vitamin E  400 Units Oral Daily    Continuous Infusions: . sodium chloride 125 mL/hr at 08/02/17 6754     Time spent: 52mins  I have personally reviewed and interpreted on  08/02/2017 daily labs, imagings as discussed above under date review session and assessment and plans.  I reviewed all nursing notes, pharmacy notes,  vitals, pertinent old records  I have discussed plan of care as described above with RN , patient and family on 08/02/2017   Florencia Reasons MD, PhD  [facility] Pager [phone]. If 7PM-7AM, please contact night-coverage at [email or web address], password [facility] 08/02/2017, 9:28 AM  LOS: 1 day

## 2017-08-02 NOTE — Consult Note (Signed)
Sara Griffith is an 81 y.o. female.    Chief Complaint: bilateral foot ulcers  HPI: 81 y/o female with extensive history of foot ulcers bilaterally with previous left great toe amputation and current treatment for bilateral feet at an outside facility. Pt admitted for a recent fall. Denies any pain to bilateral feet due to neuropathy. Recent MRI read as osteomyelitis to right great toe. She has been treated with antibiotics and daily dressing changes.   PCP:  Kathyrn Lass, MD  PMH: Past Medical History:  Diagnosis Date  . CKD (chronic kidney disease)   . Diabetes mellitus without complication ([place])   . GERD (gastroesophageal reflux disease)   . HLD (hyperlipidemia)   . Hypertension   . IDA (iron deficiency anemia)   . PVD (peripheral vascular disease) ([place])   . RLS (restless legs syndrome)   . TIA (transient ischemic attack)   . Uses walker   . Vitamin D deficiency     PSH: Past Surgical History:  Procedure Laterality Date  . ABDOMINAL HYSTERECTOMY     with bladder tac  . APPENDECTOMY    . CARPAL TUNNEL RELEASE Right   . CATARACT EXTRACTION, BILATERAL    . CERVICAL DISC SURGERY    . COLONOSCOPY    . DILATION AND CURETTAGE OF UTERUS     x 2  . left great toe amputation    . ROTATOR CUFF REPAIR Left     Social History:  reports that she has quit smoking. Her smoking use included cigarettes. She quit after 30.00 years of use. She quit smokeless tobacco use about 63 years ago. Her smokeless tobacco use included snuff. She reports that she does not drink alcohol or use drugs.  Allergies:  Allergies  Allergen Reactions  . Cymbalta [Duloxetine Hcl] Swelling  . Librax [Chlordiazepoxide-Clidinium] Swelling  . Lyrica [Pregabalin] Swelling  . Naproxen Swelling    Medications: Current Facility-Administered Medications  Medication Dose Route Frequency Provider Last Rate Last Dose  . 0.9 %  sodium chloride infusion   Intravenous Continuous Ivor Costa, MD 125 mL/hr at 08/02/17  1410    . amLODipine (NORVASC) tablet 5 mg  5 mg Oral Daily Ivor Costa, MD   5 mg at 08/02/17 1020  . atorvastatin (LIPITOR) tablet 40 mg  40 mg Oral Daily Ivor Costa, MD   40 mg at 08/02/17 1019  . cholecalciferol (VITAMIN D) tablet 1,000 Units  1,000 Units Oral Daily Ivor Costa, MD   1,000 Units at 08/02/17 1019  . collagenase (SANTYL) ointment 1 application  1 application Topical Daily Ivor Costa, MD   1 application at 47/42/59 1124  . folic acid (FOLVITE) tablet 0.5 mg  0.5 mg Oral Daily Ivor Costa, MD   0.5 mg at 08/02/17 1018  . hydrALAZINE (APRESOLINE) injection 5 mg  5 mg Intravenous Q2H PRN Ivor Costa, MD      . insulin aspart (novoLOG) injection 0-5 Units  0-5 Units Subcutaneous QHS Ivor Costa, MD      . insulin aspart (novoLOG) injection 0-9 Units  0-9 Units Subcutaneous TID WC Ivor Costa, MD   1 Units at 08/02/17 0740  . levETIRAcetam (KEPPRA) tablet 500 mg  500 mg Oral BID Ivor Costa, MD   500 mg at 08/02/17 1019  . metoprolol tartrate (LOPRESSOR) tablet 50 mg  50 mg Oral BID WC Ivor Costa, MD   50 mg at 08/02/17 0740  . omega-3 acid ethyl esters (LOVAZA) capsule 1 g  1 g Oral TID Ivor Costa,  MD   1 g at 08/02/17 1019  . ondansetron (ZOFRAN) tablet 4 mg  4 mg Oral Q6H PRN Ivor Costa, MD       Or  . ondansetron [facility]) injection 4 mg  4 mg Intravenous Q6H PRN Ivor Costa, MD      . oxyCODONE (Oxy IR/ROXICODONE) immediate release tablet 5 mg  5 mg Oral Q6H PRN Ivor Costa, MD      . pantoprazole (PROTONIX) EC tablet 40 mg  40 mg Oral Daily Ivor Costa, MD   40 mg at 08/02/17 1019  . pramipexole (MIRAPEX) tablet 0.5 mg  0.5 mg Oral QHS Ivor Costa, MD   0.5 mg at 08/01/17 2120  . psyllium (HYDROCIL/METAMUCIL) packet 1 packet  1 packet Oral Daily PRN Ivor Costa, MD      . vitamin E capsule 400 Units  400 Units Oral Daily Ivor Costa, MD   400 Units at 08/02/17 1020  . zolpidem (AMBIEN) tablet 5 mg  5 mg Oral QHS PRN Ivor Costa, MD        Results for orders placed or performed during the  hospital encounter of 07/31/17 (from the past 48 hour(s))  Lipase, blood     Status: None   Collection Time: 07/31/17  7:19 PM  Result Value Ref Range   Lipase 36 11 - 51 U/L  Comprehensive metabolic panel     Status: Abnormal   Collection Time: 07/31/17  7:19 PM  Result Value Ref Range   Sodium 134 (L) 135 - 145 mmol/L   Potassium 4.5 3.5 - 5.1 mmol/L   Chloride 102 101 - 111 mmol/L   CO2 21 (L) 22 - 32 mmol/L   Glucose, Bld 123 (H) 65 - 99 mg/dL   BUN 28 (H) 6 - 20 mg/dL   Creatinine, Ser 1.84 (H) 0.44 - 1.00 mg/dL   Calcium 9.2 8.9 - 10.3 mg/dL   Total Protein 7.9 6.5 - 8.1 g/dL   Albumin 3.3 (L) 3.5 - 5.0 g/dL   AST 51 (H) 15 - 41 U/L   ALT 23 14 - 54 U/L   Alkaline Phosphatase 81 38 - 126 U/L   Total Bilirubin 0.4 0.3 - 1.2 mg/dL   GFR calc non Af Amer 25 (L) >60 mL/min   GFR calc Af Amer 29 (L) >60 mL/min    Comment: (NOTE) The eGFR has been calculated using the CKD EPI equation. This calculation has not been validated in all clinical situations. eGFR's persistently <60 mL/min signify possible Chronic Kidney Disease.    Anion gap 11 5 - 15  CBC     Status: Abnormal   Collection Time: 07/31/17  7:19 PM  Result Value Ref Range   WBC 11.8 (H) 4.0 - 10.5 K/uL   RBC 3.80 (L) 3.87 - 5.11 MIL/uL   Hemoglobin 11.4 (L) 12.0 - 15.0 g/dL   HCT 33.7 (L) 36.0 - 46.0 %   MCV 88.7 78.0 - 100.0 fL   MCH 30.0 26.0 - 34.0 pg   MCHC 33.8 30.0 - 36.0 g/dL   RDW 13.6 11.5 - 15.5 %   Platelets 299 150 - 400 K/uL  CK     Status: Abnormal   Collection Time: 07/31/17  7:19 PM  Result Value Ref Range   Total CK 2,296 (H) 38 - 234 U/L  Urinalysis, Routine w reflex microscopic     Status: Abnormal   Collection Time: 07/31/17  7:30 PM  Result Value Ref Range   Color, Urine  YELLOW YELLOW   APPearance CLEAR CLEAR   Specific Gravity, Urine 1.017 1.005 - 1.030   pH 6.0 5.0 - 8.0   Glucose, UA NEGATIVE NEGATIVE mg/dL   Hgb urine dipstick NEGATIVE NEGATIVE   Bilirubin Urine NEGATIVE  NEGATIVE   Ketones, ur NEGATIVE NEGATIVE mg/dL   Protein, ur 30 (A) NEGATIVE mg/dL   Nitrite NEGATIVE NEGATIVE   Leukocytes, UA NEGATIVE NEGATIVE   RBC / HPF 0-5 0 - 5 RBC/hpf   WBC, UA 0-5 0 - 5 WBC/hpf   Bacteria, UA RARE (A) NONE SEEN   Squamous Epithelial / LPF 0-5 (A) NONE SEEN  CK     Status: Abnormal   Collection Time: 08/01/17  5:00 AM  Result Value Ref Range   Total CK 1,794 (H) 38 - 234 U/L  Basic metabolic panel     Status: Abnormal   Collection Time: 08/01/17  5:00 AM  Result Value Ref Range   Sodium 135 135 - 145 mmol/L   Potassium 5.0 3.5 - 5.1 mmol/L   Chloride 108 101 - 111 mmol/L   CO2 20 (L) 22 - 32 mmol/L   Glucose, Bld 120 (H) 65 - 99 mg/dL   BUN 24 (H) 6 - 20 mg/dL   Creatinine, Ser 1.87 (H) 0.44 - 1.00 mg/dL   Calcium 8.3 (L) 8.9 - 10.3 mg/dL   GFR calc non Af Amer 24 (L) >60 mL/min   GFR calc Af Amer 28 (L) >60 mL/min    Comment: (NOTE) The eGFR has been calculated using the CKD EPI equation. This calculation has not been validated in all clinical situations. eGFR's persistently <60 mL/min signify possible Chronic Kidney Disease.    Anion gap 7 5 - 15  CBC     Status: Abnormal   Collection Time: 08/01/17  5:00 AM  Result Value Ref Range   WBC 8.8 4.0 - 10.5 K/uL   RBC 3.22 (L) 3.87 - 5.11 MIL/uL   Hemoglobin 9.5 (L) 12.0 - 15.0 g/dL   HCT 28.9 (L) 36.0 - 46.0 %   MCV 89.8 78.0 - 100.0 fL   MCH 29.5 26.0 - 34.0 pg   MCHC 32.9 30.0 - 36.0 g/dL   RDW 13.9 11.5 - 15.5 %   Platelets 320 150 - 400 K/uL  Glucose, capillary     Status: Abnormal   Collection Time: 08/01/17  8:32 AM  Result Value Ref Range   Glucose-Capillary 104 (H) 65 - 99 mg/dL   Comment 1 Notify RN   Glucose, capillary     Status: Abnormal   Collection Time: 08/01/17 12:07 PM  Result Value Ref Range   Glucose-Capillary 141 (H) 65 - 99 mg/dL   Comment 1 Notify RN   Glucose, capillary     Status: Abnormal   Collection Time: 08/01/17  4:53 PM  Result Value Ref Range    Glucose-Capillary 122 (H) 65 - 99 mg/dL   Comment 1 Notify RN   Glucose, capillary     Status: Abnormal   Collection Time: 08/01/17  9:04 PM  Result Value Ref Range   Glucose-Capillary 163 (H) 65 - 99 mg/dL  CBC with Differential/Platelet     Status: Abnormal   Collection Time: 08/02/17  5:54 AM  Result Value Ref Range   WBC 8.9 4.0 - 10.5 K/uL   RBC 3.24 (L) 3.87 - 5.11 MIL/uL   Hemoglobin 9.6 (L) 12.0 - 15.0 g/dL   HCT 29.1 (L) 36.0 - 46.0 %   MCV 89.8 78.0 -  100.0 fL   MCH 29.6 26.0 - 34.0 pg   MCHC 33.0 30.0 - 36.0 g/dL   RDW 13.8 11.5 - 15.5 %   Platelets 294 150 - 400 K/uL   Neutrophils Relative % 66 %   Neutro Abs 5.8 1.7 - 7.7 K/uL   Lymphocytes Relative 23 %   Lymphs Abs 2.1 0.7 - 4.0 K/uL   Monocytes Relative 8 %   Monocytes Absolute 0.7 0.1 - 1.0 K/uL   Eosinophils Relative 3 %   Eosinophils Absolute 0.3 0.0 - 0.7 K/uL   Basophils Relative 0 %   Basophils Absolute 0.0 0.0 - 0.1 K/uL  Comprehensive metabolic panel     Status: Abnormal   Collection Time: 08/02/17  5:54 AM  Result Value Ref Range   Sodium 135 135 - 145 mmol/L   Potassium 4.2 3.5 - 5.1 mmol/L   Chloride 107 101 - 111 mmol/L   CO2 20 (L) 22 - 32 mmol/L   Glucose, Bld 128 (H) 65 - 99 mg/dL   BUN 18 6 - 20 mg/dL   Creatinine, Ser 1.49 (H) 0.44 - 1.00 mg/dL   Calcium 8.3 (L) 8.9 - 10.3 mg/dL   Total Protein 6.6 6.5 - 8.1 g/dL   Albumin 2.6 (L) 3.5 - 5.0 g/dL   AST 44 (H) 15 - 41 U/L   ALT 20 14 - 54 U/L   Alkaline Phosphatase 64 38 - 126 U/L   Total Bilirubin 0.5 0.3 - 1.2 mg/dL   GFR calc non Af Amer 32 (L) >60 mL/min   GFR calc Af Amer 37 (L) >60 mL/min    Comment: (NOTE) The eGFR has been calculated using the CKD EPI equation. This calculation has not been validated in all clinical situations. eGFR's persistently <60 mL/min signify possible Chronic Kidney Disease.    Anion gap 8 5 - 15  CK     Status: Abnormal   Collection Time: 08/02/17  5:54 AM  Result Value Ref Range   Total CK 1,514  (H) 38 - 234 U/L  Glucose, capillary     Status: Abnormal   Collection Time: 08/02/17  7:19 AM  Result Value Ref Range   Glucose-Capillary 133 (H) 65 - 99 mg/dL   Comment 1 Notify RN   Glucose, capillary     Status: Abnormal   Collection Time: 08/02/17 11:40 AM  Result Value Ref Range   Glucose-Capillary 115 (H) 65 - 99 mg/dL   Comment 1 Notify RN    Ct Head Wo Contrast  Result Date: 08/01/2017 CLINICAL DATA:  Fall, ataxia EXAM: CT HEAD WITHOUT CONTRAST TECHNIQUE: Contiguous axial images were obtained from the base of the skull through the vertex without intravenous contrast. COMPARISON:  None. FINDINGS: Brain: No acute territorial infarction. Small amount of extra-axial inter hemispheric hemorrhage anteriorly. No significant mass effect. Moderate small vessel ischemic changes of the white matter. Moderate atrophy. Old lacunar infarct in the right basal ganglia. Vascular: No hyperdense vessel.  Carotid artery calcification. Skull: No fracture Sinuses/Orbits: Mucosal thickening in the ethmoid sinus. No acute orbital abnormality Other: Left posterior scalp swelling with cutaneous stable IMPRESSION: 1. Small amount of inter hemispheric extra-axial hemorrhage. No midline shift or significant mass effect 2. Atrophy and small vessel ischemic changes of the white matter Critical Value/emergent results were called by telephone at the time of interpretation on 08/01/2017 at 3:06 am to Dr. Tomi Bamberger , who verbally acknowledged these results. Electronically Signed   By: Donavan Foil M.D.   On:  08/01/2017 03:06   Ct Hip Left Wo Contrast  Result Date: 08/01/2017 CLINICAL DATA:  Left hip pain after fall. EXAM: CT OF THE LEFT HIP WITHOUT CONTRAST TECHNIQUE: Multidetector CT imaging of the left hip was performed according to the standard protocol. Multiplanar CT image reconstructions were also generated. COMPARISON:  Radiographs yesterday. FINDINGS: Bones/Joint/Cartilage No fracture, cortical margins are intact. Mild  osteoarthritis with joint space narrowing and spurring. No hip joint effusion. Pubic rami are intact. Ligaments Suboptimally assessed by CT. Muscles and Tendons Minimal greater trochanteric enthesopathy. No intramuscular hematoma. Soft tissues No confluent soft tissue hematoma. Prominent left groin lymph nodes are likely reactive. Vascular calcifications are seen. IMPRESSION: 1. No fracture of the left hip. 2. Mild osteoarthritis. Electronically Signed   By: Jeb Levering M.D.   On: 08/01/2017 03:15   Mr Foot Right Wo Contrast  Result Date: 08/01/2017 CLINICAL DATA:  Bilateral foot pain near toes. Evaluate for osteomyelitis. EXAM: MRI OF THE RIGHT FOREFOOT WITHOUT CONTRAST TECHNIQUE: Multiplanar, multisequence MR imaging of the right forefoot was performed. No intravenous contrast was administered. COMPARISON:  None. FINDINGS: Bones/Joint/Cartilage Increased STIR signal with corresponding decreased T1 marrow signal in the first distal phalanx. Remaining marrow signal is preserved. No acute fracture or malalignment. Muscles and Tendons Moderate fatty atrophy and edema within the intrinsic muscles of the forefoot, as can be seen with diabetic muscle changes. No evidence of tenosynovitis. Soft tissues Soft tissue ulceration along the plantar tip of the great toe. No drainable fluid collection. IMPRESSION: 1. Soft tissue ulceration along the plantar tip of the great toe with underlying osteomyelitis of the first distal phalanx. Electronically Signed   By: Titus Dubin M.D.   On: 08/01/2017 15:36   Mr Foot Left Wo Contrast  Result Date: 08/01/2017 CLINICAL DATA:  Bilateral foot pain.  Evaluate for osteomyelitis. EXAM: MRI OF THE LEFT FOOT WITHOUT CONTRAST TECHNIQUE: Multiplanar, multisequence MR imaging of the left forefoot was performed. No intravenous contrast was administered. COMPARISON:  None. FINDINGS: Despite efforts by the technologist and patient, motion artifact is present on today's exam and  could not be eliminated. This reduces exam sensitivity and specificity. Bones/Joint/Cartilage Prior amputation of the great toe to the level of the proximal metatarsal. No definite bone marrow signal abnormality to suggest osteomyelitis. No acute fracture or malalignment. Muscles and Tendons Diffuse fatty atrophy of the intrinsic muscles of the forefoot. No definite evidence of tenosynovitis. Soft tissues No drainable fluid collection. IMPRESSION: 1. Extremely limited study due to motion artifact. No definite evidence of osteomyelitis or drainable fluid collection. Consider repeating the study when the patient is better able to tolerate it. 2. 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 Electronically Signed   By: Titus Dubin M.D.   On: 08/01/2017 15:16   Dg Hip Unilat W Or Wo Pelvis 2-3 Views Left  Result Date: 08/01/2017 CLINICAL DATA:  Left hip pain after fall at home 1 week ago. EXAM: DG HIP (WITH OR WITHOUT PELVIS) 2-3V LEFT COMPARISON:  None. FINDINGS: The cortical margins of the bony pelvis and left hip are intact. No fracture. Pubic symphysis and sacroiliac joints are congruent. Both femoral heads are well-seated in the respective acetabula. IMPRESSION: No fracture of the pelvis or left hip. Electronically Signed   By: Jeb Levering M.D.   On: 08/01/2017 00:30    ROS: ROS History of chronic ulcers to bilateral feet Previous left great toe amputation  Physical Exam: Alert and appropriate 81 y/o female in no  acute distress Right foot: great toe with mild edema, mild erythema Quarter sized ulcer to plantar aspect of wound with no drainage No signs of gangrene currently No ascending erythema or swelling. Able to wiggle the toe. 2 plus dorsalis pedis pulse in the right foot. Decreased sensation bilaterally secondary to neuropathy Left foot: small plantar wound superficial with no drainage at 2nd metatarsal head Previous left great toe amputation Physical Exam    Assessment/Plan Assessment: chronic ulcers bilateral feet, recent MRI finding of osteomyelitis right great toe  Plan: Discussed with pt that an amputation will be needed to eliminate the osteo  Will discuss case with Dr. Sharol Given and determine best course of action Continue daily dry dressing changes Activity as tolerated Pain management as needed  I agree with the above assessment and plan.  Will discuss with Dr Sharol Given tomorrow regarding definitive surgical care. Continue with empiric antibiotics.    Thank you.

## 2017-08-02 NOTE — Clinical Social Work Note (Signed)
Clinical Social Work Assessment  Patient Details  Name: Sara Griffith MRN: [id number] Date of Birth: March 18, 1936  Date of referral:  08/02/17               Reason for consult:  Facility Placement                Permission sought to share information with:  Facility Art therapist granted to share information::  Yes, Verbal Permission Granted  Name::        Agency::  SNF  Relationship::     Contact Information:     Housing/Transportation Living arrangements for the past 2 months:  [place] of Information:  Patient Patient Interpreter Needed:  None Criminal Activity/Legal Involvement Pertinent to Current Situation/Hospitalization:  No - Comment as needed Significant Relationships:  Friend, Other Family Members Lives with:  Facility Resident Do you feel safe going back to the place where you live?  Yes Need for family participation in patient care:  No (Coment)  Care giving concerns:  Pt resided at [facility], independent living. Pt indicated that she does not want to go to SNF and would much rather return back to independent living and have home health services. Pt states that she has power of attorney, however, she is making decision for herself. CSW validated and agreed. Pt states that she can have home health at her home and has someone that cleans up weekly. She indicated that she uses a walker and has a shower seat in her bathroom. She indicated that she has friends in her independent living home that will check on her and provide support. CSW listened and indicated that if she changes her mind for short term rehab, CSW will assist with SNF options.  Social Worker assessment / plan:  CSW will provide assistance if warranted as patient declined SNF placement.    Employment status:  Retired Nurse, adult PT Recommendations:  Home with [employer], [place] / Referral to community  resources:  [place]  Patient/Family's Response to care:  Patient appreciative of CSW assistance however declined SNF placement at this time. No issues or concerns identified.  Patient/Family's Understanding of and Emotional Response to Diagnosis, Current Treatment, and Prognosis:  Pt has good understanding of diagnosis, current treatment and prognosis and desires to go home with home health services vs. SNF. CSW validated and listened. Pt understood and feels that she has good support at facility. No issues or concerns identified at this time.  Emotional Assessment Appearance:  Appears stated age Attitude/Demeanor/Rapport:  (Cooperative and friendly) Affect (typically observed):  Accepting, Appropriate Orientation:  Oriented to Situation, Oriented to Place, Oriented to Self Alcohol / Substance use:  Not Applicable Psych involvement (Current and /or in the community):  No (Comment)  Discharge Needs  Concerns to be addressed:  Discharge Planning Concerns Readmission within the last 30 days:  No Current discharge risk:  Dependent with Mobility, Physical Impairment Barriers to Discharge:  No Barriers Identified   Normajean Baxter, LCSW 08/02/2017, 4:16 PM

## 2017-08-02 NOTE — Progress Notes (Signed)
Pt arrived back to floor

## 2017-08-02 NOTE — Progress Notes (Signed)
CT called for pt. Pt unhooked from IV, assisted to wheelchair, wheeled down to CT by transport.

## 2017-08-02 NOTE — Evaluation (Signed)
Occupational Therapy Evaluation Patient Details Name: Sara Griffith MRN: [id number] DOB: [birth year] Today's Date: 08/02/2017    History of Present Illness  Sara Griffith is a 81 y.o. female with medical history significant of hypertension, hyperlipidemia, diabetes mellitus, TIA, GERD, RLS, PVD, CKD-III, recent fall which caused small ICH, who presents with fall and left groin pain.    Clinical Impression   PTA, pt was independent with ADL, required assistance from daughter or PCA for IADL and used rollator for all mobility. Pt currently requires min guard assist with ADL and functional mobility due to mild deconditioning and her reported feeling of overall malaise. Pt has a hx of falling and is a frequent faller. She reports that she typically always has a fall when there are medication changes and reports she takes medicines 4x/day. Discussed fall prevention and home safety strategies - pt currently does not have a Lifeline but highly recommend that pt's family obtain one. Pt also reported that she pulls on a towel rack in her bathroom to help her stand up from the toilet - HIGHLY recommend 3in1 to be positioned over the toilet to provide UE support to help pt complete this transfer safely. See below for further DME and rehab recommendations. OT will continue to follow acutely.    Follow Up Recommendations  SNF;Supervision - Intermittent (feel pt is progressing well with ADL and mobility performance and could progress to only needing Home health OT/PT. HIGHLY recommend Home health OT  for follow up on medication management and home safety modifications)    Equipment Recommendations  3 in 1 bedside commode(to go over toilet); Rolling walker - 2 wheeled    Recommendations for Other Services       Precautions / Restrictions Precautions Precautions: Fall Precaution Comments: Frequent faller. Medication changes have caused multiple falls in past. Restrictions Weight Bearing Restrictions: No       Mobility Bed Mobility               General bed mobility comments: Pt up in chair on OT arrival  Transfers Overall transfer level: Needs assistance Equipment used: Rolling walker (2 wheeled) Transfers: Sit to/from Stand Sit to Stand: Min guard         General transfer comment: Min guard assist for safety. Pt did not require physical assist for functional transfers of ambulation. Pt with good hand placement during transfers.     Balance Overall balance assessment: Needs assistance Sitting-balance support: No upper extremity supported;Feet supported Sitting balance-Leahy Scale: Good     Standing balance support: No upper extremity supported;During functional activity Standing balance-Leahy Scale: Fair Standing balance comment: Able to maintain static standing balance with no swaying or LOB during grooming tasks                           ADL either performed or assessed with clinical judgement   ADL Overall ADL's : Needs assistance/impaired Eating/Feeding: Set up;Sitting   Grooming: Wash/dry hands;Wash/dry face;Min guard;Standing   Upper Body Bathing: Set up;Sitting   Lower Body Bathing: Min guard;Sit to/from stand   Upper Body Dressing : Supervision/safety;Sitting   Lower Body Dressing: Min guard;Sit to/from stand   Toilet Transfer: Min guard;Ambulation;Cueing for safety;Comfort height toilet;Grab bars;RW   Toileting- Water quality scientist and Hygiene: Min guard;Sit to/from stand       Functional mobility during ADLs: Min guard;Rolling walker General ADL Comments: No family present for OT eval. Discussed fall prevention, medication, management and home safety. Pt open  to Lifeline and I feel this would be a helpful safety tool for home as pt is a frequent faller.     Vision Baseline Vision/History: Wears glasses Wears Glasses: Reading only Patient Visual Report: No change from baseline       Perception     Praxis      Pertinent  Vitals/Pain Pain Assessment: No/denies pain("I just don't feel good all over.")     Hand Dominance Right   Extremity/Trunk Assessment Upper Extremity Assessment Upper Extremity Assessment: Generalized weakness   Lower Extremity Assessment Lower Extremity Assessment: Generalized weakness LLE Sensation: history of peripheral neuropathy   Cervical / Trunk Assessment Cervical / Trunk Assessment: Kyphotic   Communication Communication Communication: No difficulties   Cognition Arousal/Alertness: Awake/alert Behavior During Therapy: WFL for tasks assessed/performed Overall Cognitive Status: No family/caregiver present to determine baseline cognitive functioning                                     General Comments       Exercises     Shoulder Instructions      Home Living Family/patient expects to be discharged to:: Assisted living Living Arrangements: Alone Available Help at Discharge: Family;Friend(s);Available PRN/intermittently(Daughter lives in [place]; neighbors in building) Type of Home: Independent living facility(apartment on the 3rd floor) Home Access: Elevator     Home Layout: One level               Home Equipment: Shower seat;Hand held shower head;Grab bars - tub/shower;Walker - 4 wheels   Additional Comments: Pt lives at [facility].      Prior Functioning/Environment Level of Independence: Needs assistance  Gait / Transfers Assistance Needed: uses a rollator for all mobility ADL's / Homemaking Assistance Needed: PCA assists with housekeeping, meal prep, laundry 2x/week. RN provides wound care. Daughter assists with medication management - pt reports takes medications 4x/day.             OT Problem List: Decreased strength;Decreased activity tolerance;Impaired balance (sitting and/or standing);Decreased range of motion;Decreased safety awareness;Decreased knowledge of use of DME or AE;Impaired sensation      OT  Treatment/Interventions: Self-care/ADL training;Therapeutic exercise;DME and/or AE instruction;Therapeutic activities;Patient/family education;Balance training    OT Goals(Current goals can be found in the care plan section) Acute Rehab OT Goals Patient Stated Goal: return to [facility] OT Goal Formulation: With patient Time For Goal Achievement: 08/16/17  OT Frequency: Min 2X/week   Barriers to D/C: Decreased caregiver support  Lives alone; daughter lives in [place] and no other family       Co-evaluation              AM-PAC PT "6 Clicks" Daily Activity     Outcome Measure Help from another person eating meals?: None Help from another person taking care of personal grooming?: None Help from another person toileting, which includes using toliet, bedpan, or urinal?: None Help from another person bathing (including washing, rinsing, drying)?: A Little Help from another person to put on and taking off regular upper body clothing?: None Help from another person to put on and taking off regular lower body clothing?: A Little 6 Click Score: 22   End of Session Equipment Utilized During Treatment: Gait belt;Rolling walker Nurse Communication: Mobility status  Activity Tolerance: Patient tolerated treatment well Patient left: in chair;with call bell/phone within reach;with chair alarm set  OT Visit Diagnosis: Unsteadiness on feet (R26.81);Repeated falls (R29.6);Muscle  weakness (generalized) (M62.81);History of falling (Z91.81)                Time: 1015-1050 OT Time Calculation (min): 35 min Charges:  OT Evaluation $OT Eval Moderate Complexity: 1 Mod OT Treatments $Self Care/Home Management : 8-22 mins G-Codes:     Redmond Baseman, MS, OTR/L 08/02/2017, 11:49 AM

## 2017-08-03 ENCOUNTER — Inpatient Hospital Stay (HOSPITAL_COMMUNITY): Payer: Medicare HMO

## 2017-08-03 DIAGNOSIS — M86671 Other chronic osteomyelitis, right ankle and foot: Secondary | ICD-10-CM

## 2017-08-03 DIAGNOSIS — E11621 Type 2 diabetes mellitus with foot ulcer: Secondary | ICD-10-CM

## 2017-08-03 LAB — CBC WITH DIFFERENTIAL/PLATELET
BASOS ABS: 0 10*3/uL (ref 0.0–0.1)
BASOS PCT: 0 %
Eosinophils Absolute: 0.4 10*3/uL (ref 0.0–0.7)
Eosinophils Relative: 5 %
HEMATOCRIT: 27.6 % — AB (ref 36.0–46.0)
HEMOGLOBIN: 9 g/dL — AB (ref 12.0–15.0)
LYMPHS PCT: 26 %
Lymphs Abs: 2.1 10*3/uL (ref 0.7–4.0)
MCH: 29.5 pg (ref 26.0–34.0)
MCHC: 32.6 g/dL (ref 30.0–36.0)
MCV: 90.5 fL (ref 78.0–100.0)
Monocytes Absolute: 0.7 10*3/uL (ref 0.1–1.0)
Monocytes Relative: 8 %
NEUTROS ABS: 4.9 10*3/uL (ref 1.7–7.7)
NEUTROS PCT: 61 %
Platelets: 270 10*3/uL (ref 150–400)
RBC: 3.05 MIL/uL — AB (ref 3.87–5.11)
RDW: 14.2 % (ref 11.5–15.5)
WBC: 8.1 10*3/uL (ref 4.0–10.5)

## 2017-08-03 LAB — COMPREHENSIVE METABOLIC PANEL
ALT: 18 U/L (ref 14–54)
ANION GAP: 5 (ref 5–15)
AST: 32 U/L (ref 15–41)
Albumin: 2.4 g/dL — ABNORMAL LOW (ref 3.5–5.0)
Alkaline Phosphatase: 59 U/L (ref 38–126)
BUN: 14 mg/dL (ref 6–20)
CALCIUM: 8 mg/dL — AB (ref 8.9–10.3)
CO2: 20 mmol/L — ABNORMAL LOW (ref 22–32)
Chloride: 112 mmol/L — ABNORMAL HIGH (ref 101–111)
Creatinine, Ser: 1.34 mg/dL — ABNORMAL HIGH (ref 0.44–1.00)
GFR, EST AFRICAN AMERICAN: 42 mL/min — AB (ref >60)
GFR, EST NON AFRICAN AMERICAN: 37 mL/min — AB (ref >60)
Glucose, Bld: 124 mg/dL — ABNORMAL HIGH (ref 65–99)
POTASSIUM: 4.1 mmol/L (ref 3.5–5.1)
SODIUM: 137 mmol/L (ref 135–145)
TOTAL PROTEIN: 6 g/dL — AB (ref 6.5–8.1)
Total Bilirubin: 0.4 mg/dL (ref 0.3–1.2)

## 2017-08-03 LAB — GLUCOSE, CAPILLARY
GLUCOSE-CAPILLARY: 117 mg/dL — AB (ref 65–99)
GLUCOSE-CAPILLARY: 152 mg/dL — AB (ref 65–99)
GLUCOSE-CAPILLARY: 172 mg/dL — AB (ref 65–99)
Glucose-Capillary: 143 mg/dL — ABNORMAL HIGH (ref 65–99)

## 2017-08-03 LAB — HEMOGLOBIN A1C
Hgb A1c MFr Bld: 7.4 % — ABNORMAL HIGH (ref 4.8–5.6)
MEAN PLASMA GLUCOSE: 166 mg/dL

## 2017-08-03 LAB — CK: CK TOTAL: 775 U/L — AB (ref 38–234)

## 2017-08-03 NOTE — Care Management Note (Signed)
Case Management Note  Patient Details  Name: Sara Griffith MRN: [id number] Date of Birth: Jul 27, 1936  Subjective/Objective:                  rhabdomyolysis  Action/Plan: Date:  August 03, 2017 Chart reviewed for concurrent status and case management needs.  Will continue to follow patient progress.  Discharge Planning: following for needs  Expected discharge date: August 06, 2017  Velva Harman, BSN, [place], [place]   Expected Discharge Date:                  Expected Discharge Plan:  Home/Self Care  In-House Referral:     Discharge planning Services  CM Consult  Post Acute Care Choice:    Choice offered to:     DME Arranged:    DME Agency:     HH Arranged:    HH Agency:     Status of Service:  In process, will continue to follow  If discussed at Long Length of Stay Meetings, dates discussed:    Additional Comments:  Leeroy Cha, RN 08/03/2017, 10:32 AM

## 2017-08-03 NOTE — Consult Note (Signed)
ORTHOPAEDIC CONSULTATION  REQUESTING PHYSICIAN: Florencia Reasons, MD  Chief Complaint: Pain swelling and chronic ulceration right great toe  HPI: Sara Griffith is a 81 y.o. female who presents with chronic osteomyelitis of the right great toe.  Patient states she underwent a left first ray amputation in [place] and has been currently going to podiatry for debridement of a Waggoner grade 1 ulcer beneath the second metatarsal head of her left foot.  She describes chronic redness pain and swelling of the right great toe.  Past Medical History:  Diagnosis Date  . CKD (chronic kidney disease)   . Diabetes mellitus without complication ([place])   . GERD (gastroesophageal reflux disease)   . HLD (hyperlipidemia)   . Hypertension   . IDA (iron deficiency anemia)   . PVD (peripheral vascular disease) ([place])   . RLS (restless legs syndrome)   . TIA (transient ischemic attack)   . Uses walker   . Vitamin D deficiency    Past Surgical History:  Procedure Laterality Date  . ABDOMINAL HYSTERECTOMY     with bladder tac  . APPENDECTOMY    . CARPAL TUNNEL RELEASE Right   . CATARACT EXTRACTION, BILATERAL    . CERVICAL DISC SURGERY    . COLONOSCOPY    . DILATION AND CURETTAGE OF UTERUS     x 2  . left great toe amputation    . ROTATOR CUFF REPAIR Left    Social History   Socioeconomic History  . Marital status: Widowed    Spouse name: None  . Number of children: None  . Years of education: None  . Highest education level: None  Social Needs  . Financial resource strain: None  . Food insecurity - worry: None  . Food insecurity - inability: None  . Transportation needs - medical: None  . Transportation needs - non-medical: None  Occupational History  . None  Tobacco Use  . Smoking status: Former Smoker    Years: 30.00    Types: Cigarettes  . Smokeless tobacco: Former Systems developer    Types: Snuff    Quit date: 07/01/1954  Substance and Sexual Activity  . Alcohol use: No  . Drug use: No  .  Sexual activity: None  Other Topics Concern  . None  Social History Narrative  . None   Family History  Problem Relation Age of Onset  . Hypertension Mother   . Cancer Father   . Arrhythmia Sister        ppm  . Arrhythmia Brother        ppm   - negative except otherwise stated in the family history section Allergies  Allergen Reactions  . Cymbalta [Duloxetine Hcl] Swelling  . Librax [Chlordiazepoxide-Clidinium] Swelling  . Lyrica [Pregabalin] Swelling  . Naproxen Swelling   Prior to Admission medications   Medication Sig Start Date End Date Taking? Authorizing Provider  acetaminophen (TYLENOL) 325 MG tablet Take 650 mg by mouth every 6 (six) hours as needed for mild pain, moderate pain or headache.   Yes [provider]  amLODipine (NORVASC) 5 MG tablet TAKE 1 TABLET (5 MG TOTAL) BY MOUTH DAILY. 04/01/17  Yes Bhagat, Bhavinkumar, PA  atorvastatin (LIPITOR) 40 MG tablet Take 40 mg by mouth daily.   Yes [provider]  cholecalciferol (VITAMIN D) 1000 units tablet Take 1,000 Units by mouth daily.   Yes [provider]  collagenase (SANTYL) ointment Apply 1 application topically daily.   Yes [provider]  folic acid (  FOLVITE) 1 MG tablet Take 0.5 mg by mouth daily.   Yes [provider]  levETIRAcetam (KEPPRA) 500 MG tablet Take 500 mg by mouth 2 (two) times daily.   Yes [provider]  linagliptin (TRADJENTA) 5 MG TABS tablet Take 5 mg by mouth daily.   Yes [provider]  losartan (COZAAR) 100 MG tablet Take 100 mg by mouth daily. 05/30/16  Yes [provider]  metoprolol tartrate (LOPRESSOR) 50 MG tablet Take 1 tablet (50 mg total) by mouth 2 (two) times daily with a meal. 07/04/16  Yes Eugenie Filler, MD  omega-3 acid ethyl esters (LOVAZA) 1 g capsule Take 1 g by mouth 3 (three) times daily.  06/28/16  Yes [provider]  omeprazole (PRILOSEC) 40 MG capsule Take 40 mg by mouth daily. 06/28/16   Yes [provider]  OVER THE COUNTER MEDICATION Place 1 drop into both eyes as needed (dry eyes). Over the counter dry eye drops.   Yes [provider]  pramipexole (MIRAPEX) 0.5 MG tablet Take 0.5 mg by mouth at bedtime.   Yes [provider]  psyllium (METAMUCIL) 58.6 % packet Take 1 packet by mouth daily as needed (constipation).   Yes [provider]  traMADol (ULTRAM) 50 MG tablet Take 50 mg by mouth 3 (three) times daily. 06/05/16  Yes [provider]  vitamin E 400 UNIT capsule Take 400 Units by mouth daily.   Yes [provider]   Ct Head Wo Contrast  Result Date: 08/02/2017 CLINICAL DATA:  Initial evaluation for acute altered mental status recent intracranial hemorrhage. EXAM: CT HEAD WITHOUT CONTRAST TECHNIQUE: Contiguous axial images were obtained from the base of the skull through the vertex without intravenous contrast. COMPARISON:  Prior CT from 08/01/2017. FINDINGS: Brain: Stable atrophy with chronic microvascular ischemic disease. Small volume extra-axial hemorrhage positioned along the interhemispheric fissure anteriorly again seen, stable to slightly decreased relative to previous exam (series 2, image 17). No significant mass effect. No evidence for new or interval hemorrhage. No evidence for acute large vessel territory infarct. No mass lesion or midline shift. Stable ventricular prominence related to global parenchymal volume loss without hydrocephalus. No extra-axial fluid collection. Vascular: No hyperdense vessel. Scattered vascular calcifications noted within the carotid siphons. Skull: Skin staples in place at the left parietal scalp. Scalp soft tissues otherwise unremarkable. Calvarium intact. Sinuses/Orbits: Globes and orbital soft tissues within normal limits. Visualized paranasal sinuses and mastoids are clear. Other: None. IMPRESSION: 1. Stable 2 slightly decreased small volume extra-axial hemorrhage along the  interhemispheric fissure as compared to previous. No mass effect. 2. No other new intracranial abnormality. 3. Stable atrophy with chronic small vessel ischemic disease. Electronically Signed   By: Jeannine Boga M.D.   On: 08/02/2017 21:51   Mr Foot Left Wo Contrast  Result Date: 08/03/2017 CLINICAL DATA:  Left foot swelling and pain in a diabetic patient with an extensive history of foot ulcers. History of prior left great toe amputation. EXAM: MRI OF THE LEFT FOOT WITHOUT CONTRAST TECHNIQUE: Multiplanar, multisequence MR imaging of the left foot was performed. No intravenous contrast was administered. COMPARISON:  MRI left foot 08/01/2017. FINDINGS: Bones/Joint/Cartilage The patient is status post amputation at the level of the base of the first metatarsal. No bone marrow signal abnormality to suggest osteomyelitis is identified. No fracture or stress change. Ligaments Intact. Muscles and Tendons Marked atrophy of intrinsic musculature the foot is present. No strain or tear. No evidence of infectious or inflammatory  process. Soft tissues No abscess is identified. IMPRESSION: Negative for osteomyelitis.  No acute abnormality. Status amputation of the first ray at the level of the base of the first metatarsal. Electronically Signed   By: Inge Rise M.D.   On: 08/03/2017 09:39   - pertinent xrays, CT, MRI studies were reviewed and independently interpreted  Positive ROS: All other systems have been reviewed and were otherwise negative with the exception of those mentioned in the HPI and as above.  Physical Exam: General: Alert, no acute distress, patient does have some mild dementia. Psychiatric: Patient is competent for consent with normal mood and affect Lymphatic: No axillary or cervical lymphadenopathy Cardiovascular: No pedal edema Respiratory: No cyanosis, no use of accessory musculature GI: No organomegaly, abdomen is soft and non-tender  Skin: Left foot she has a Waggoner grade  1 ulcer beneath the second metatarsal head.  Right foot she has swelling ulceration and cellulitis of the right great toe.      Neurologic: Patient does not have protective sensation bilateral lower extremities.   MUSCULOSKELETAL:   She has fixed clawing of the lesser toes left foot, she does not have a heel cord contractures she has a well-healed first ray amputation of the left foot.  Examination the right foot she has a good dorsalis pedis pulse she has swelling ulceration and cellulitis of the right great toe.  Review of the MRI scan shows chronic osteomyelitis of the tuft of the great toe.  Assessment: Assessment:   1.Status post left foot first ray amputation with fixed clawing of the lesser toes and a Waggoner grade 1 ulcer 10 mm in diameter beneath the second metatarsal head.    2.Osteomyelitis right great toe, with Waggoner grade 3 ulceration, with a good dorsalis pedis pulse.  Plan: Continue IV antibiotics through this week, discontinue antibiotics Saturday morning.  Plan for right foot great toe amputation at the MTP joint on Friday at [facility].  Patient will need to be transferred to [facility].  Patient currently lives in a retirement community I do not know if they have skilled nursing available.  Patient will need discharge to skilled nursing for approximately 20 days.  She will be nonweightbearing on the right foot and weightbearing on the left heel for transfers only for approximately 3 weeks.   Thank you for the consult and the opportunity to see Ms. Aleana Fifita, [place] [phone] 5:58 PM

## 2017-08-03 NOTE — Progress Notes (Signed)
PROGRESS NOTE  Sara Griffith QPY:[id number] DOB: 01-12-1936 DOA: 07/31/2017 PCP: Kathyrn Lass, MD  HPI/Recap of past 24 hours:  Denies pain, sitting up in chair   Assessment/Plan: Principal Problem:   Fall Active Problems:   RLS (restless legs syndrome)   GERD (gastroesophageal reflux disease)   HLD (hyperlipidemia)   HTN (hypertension)   Type II diabetes mellitus with renal manifestations ([place])   Left groin pain   Rhabdomyolysis   History of intracranial hemorrhage   Acute renal failure superimposed on stage 3 chronic kidney disease (HCC)   Ulcer of toe of right foot ([place])   Frequent falls ( recent hospitalized for ICH from a fall)  Rhabdomyolysis:  -she fell at her apartment, and lay on the floor for a day -CK 2296 on presentation,  -ck improving on IVF -repeat CK in AM  AoCKD-III:  -Baseline Cre is 1.2, pt's Cre is 1.84 and BUN 28 on admission.  -Likely due to prerenal secondary to rhabdo, dehydration, continuation of ARB - IVF as above -  Hold cozaar, renal dosing meds -cr improving  noninsulin dependent Type II diabetes mellitus with renal manifestations ([place]): Last A1c 8.0 on 10/21/16, poorly controled. Patient is taking Tradjenta at home -SSI    Bilateral diabetic foot ulcers Right toe ulcers concerning for underline infection, she reports recently finished  A course of abx for this Mri foot confirmed "osteomyelitis of the first distal phalanx", start vanc/zosyn Repeat left foot mri ( poor quality due to movement on first attempt) ABI pending Ortho consulted  Code Status: DNR  Family Communication: patient and daughter who is a Architectural technologist   Disposition Plan: patient prefers go back to independent living at discharge, not sure if safe, will ask PT eval   Consultants:  ortho  Procedures:  none  Antibiotics:  Vanc/zosyn started on 12/2   Objective: BP 109/62 (BP Location: Right Arm)   Pulse 78   Temp 98.6 F (37 C) (Oral)    Resp 17   Ht 4\' 11"  (1.499 m)   Wt 61.2 kg (135 lb)   LMP  (LMP Unknown)   SpO2 96%   BMI 27.27 kg/m   Intake/Output Summary (Last 24 hours) at 08/03/2017 0819 Last data filed at 08/02/2017 1800 Gross per 24 hour  Intake 1927.92 ml  Output -  Net 1927.92 ml   Filed Weights   07/31/17 1718  Weight: 61.2 kg (135 lb)    Exam: Patient is examined daily including today on 08/03/2017, exams remain the same as of yesterday except that has changed    General:  NAD  Cardiovascular: RRR  Respiratory: CTABL  Abdomen: Soft/ND/NT, positive BS  Musculoskeletal: right big toe with ulceration plantar surface, edematous and erythema entire right big toe. Right foot first MTP planter surface ulcer with scan drainage, no odor, decreased sensation both feet. Left foot deformity  Neuro: alert, oriented   Data Reviewed: Basic Metabolic Panel: Recent Labs  Lab 07/31/17 1919 08/01/17 0500 08/02/17 0554 08/03/17 0550  NA 134* 135 135 137  K 4.5 5.0 4.2 4.1  CL 102 108 107 112*  CO2 21* 20* 20* 20*  GLUCOSE 123* 120* 128* 124*  BUN 28* 24* 18 14  CREATININE 1.84* 1.87* 1.49* 1.34*  CALCIUM 9.2 8.3* 8.3* 8.0*   Liver Function Tests: Recent Labs  Lab 07/31/17 1919 08/02/17 0554 08/03/17 0550  AST 51* 44* 32  ALT 23 20 18   ALKPHOS 81 64 59  BILITOT 0.4 0.5 0.4  PROT 7.9  6.6 6.0*  ALBUMIN 3.3* 2.6* 2.4*   Recent Labs  Lab 07/31/17 1919  LIPASE 36   No results for input(s): AMMONIA in the last 168 hours. CBC: Recent Labs  Lab 07/31/17 1919 08/01/17 0500 08/02/17 0554 08/03/17 0550  WBC 11.8* 8.8 8.9 8.1  NEUTROABS  --   --  5.8 4.9  HGB 11.4* 9.5* 9.6* 9.0*  HCT 33.7* 28.9* 29.1* 27.6*  MCV 88.7 89.8 89.8 90.5  PLT 299 320 294 270   Cardiac Enzymes:   Recent Labs  Lab 07/31/17 1919 08/01/17 0500 08/02/17 0554 08/03/17 0550  CKTOTAL 2,296* 1,794* 1,514* 775*   BNP (last 3 results) Recent Labs    10/21/16 1437  BNP 295.2*    ProBNP (last 3  results) No results for input(s): PROBNP in the last 8760 hours.  CBG: Recent Labs  Lab 08/02/17 0719 08/02/17 1140 08/02/17 1634 08/02/17 2041 08/03/17 0719  GLUCAP 133* 115* 192* 133* 117*    No results found for this or any previous visit (from the past 240 hour(s)).   Studies: Ct Head Wo Contrast  Result Date: 08/02/2017 CLINICAL DATA:  Initial evaluation for acute altered mental status recent intracranial hemorrhage. EXAM: CT HEAD WITHOUT CONTRAST TECHNIQUE: Contiguous axial images were obtained from the base of the skull through the vertex without intravenous contrast. COMPARISON:  Prior CT from 08/01/2017. FINDINGS: Brain: Stable atrophy with chronic microvascular ischemic disease. Small volume extra-axial hemorrhage positioned along the interhemispheric fissure anteriorly again seen, stable to slightly decreased relative to previous exam (series 2, image 17). No significant mass effect. No evidence for new or interval hemorrhage. No evidence for acute large vessel territory infarct. No mass lesion or midline shift. Stable ventricular prominence related to global parenchymal volume loss without hydrocephalus. No extra-axial fluid collection. Vascular: No hyperdense vessel. Scattered vascular calcifications noted within the carotid siphons. Skull: Skin staples in place at the left parietal scalp. Scalp soft tissues otherwise unremarkable. Calvarium intact. Sinuses/Orbits: Globes and orbital soft tissues within normal limits. Visualized paranasal sinuses and mastoids are clear. Other: None. IMPRESSION: 1. Stable 2 slightly decreased small volume extra-axial hemorrhage along the interhemispheric fissure as compared to previous. No mass effect. 2. No other new intracranial abnormality. 3. Stable atrophy with chronic small vessel ischemic disease. Electronically Signed   By: Jeannine Boga M.D.   On: 08/02/2017 21:51    Scheduled Meds: . amLODipine  5 mg Oral Daily  . atorvastatin  40  mg Oral Daily  . cholecalciferol  1,000 Units Oral Daily  . collagenase  1 application Topical Daily  . folic acid  0.5 mg Oral Daily  . insulin aspart  0-5 Units Subcutaneous QHS  . insulin aspart  0-9 Units Subcutaneous TID WC  . levETIRAcetam  500 mg Oral BID  . metoprolol tartrate  50 mg Oral BID WC  . omega-3 acid ethyl esters  1 g Oral TID  . pantoprazole  40 mg Oral Daily  . pramipexole  0.5 mg Oral QHS  . vitamin E  400 Units Oral Daily    Continuous Infusions: . sodium chloride Stopped (08/03/17 0745)  . piperacillin-tazobactam (ZOSYN)  IV Stopped (08/03/17 9509)  . [START ON 08/04/2017] vancomycin       Time spent: 51mins  I have personally reviewed and interpreted on  08/03/2017 daily labs, imagings as discussed above under date review session and assessment and plans.  I reviewed all nursing notes, pharmacy notes,  vitals, pertinent old records  I have discussed plan  of care as described above with RN , patient and family on 08/03/2017   Florencia Reasons MD, PhD  [facility] Pager [phone]. If 7PM-7AM, please contact night-coverage at [email or web address], password [email or web address] 08/03/2017, 8:19 AM  LOS: 2 days

## 2017-08-03 NOTE — Progress Notes (Signed)
16 staples removed from patient's head. Patient tolerated well. No bleeding. Still a lot of old blood on scalp left. Advised patient not to wash hair for 72 hours post staple removal. Patient states understanding. Will continue to monitor patient.

## 2017-08-03 NOTE — Progress Notes (Signed)
ABI prelim: bilat WNL. Landry Mellow, RDMS, RVT

## 2017-08-03 NOTE — Progress Notes (Signed)
Occupational Therapy Treatment Patient Details Name: Heer Justiss MRN: [id number] DOB: 1936/03/04 Today's Date: 08/03/2017    History of present illness  Lakesa Vandenbrink is a 81 y.o. female with medical history significant of hypertension, hyperlipidemia, diabetes mellitus, TIA, GERD, RLS, PVD, CKD-III, recent fall which caused small ICH, who presents with fall and left groin pain.    OT comments  Would benefit from increased level of care  Follow Up Recommendations  SNF- but pt may refuse    Equipment Recommendations  3 in 1 bedside commode(to go over toilet)    Recommendations for Other Services      Precautions / Restrictions Precautions Precautions: Fall Precaution Comments: Frequent faller. Medication changes have caused multiple falls in past. Restrictions Weight Bearing Restrictions: No       Mobility Bed Mobility Overal bed mobility: Needs Assistance Bed Mobility: Supine to Sit     Supine to sit: Min assist        Transfers Overall transfer level: Needs assistance Equipment used: Rolling walker (2 wheeled) Transfers: Sit to/from Stand Sit to Stand: Min assist         General transfer comment: min A to steady, vc's for hand placement with RW    Balance Overall balance assessment: Needs assistance Sitting-balance support: No upper extremity supported;Feet supported Sitting balance-Leahy Scale: Fair     Standing balance support: No upper extremity supported;During functional activity Standing balance-Leahy Scale: Fair                             ADL either performed or assessed with clinical judgement   ADL Overall ADL's : Needs assistance/impaired                     Lower Body Dressing: Moderate assistance;Sit to/from stand;Cueing for sequencing;Cueing for safety   Toilet Transfer: Minimal assistance;Ambulation;RW;Comfort height toilet;BSC   Toileting- Clothing Manipulation and Hygiene: Sit to/from stand;Minimal  assistance;Cueing for sequencing;Cueing for safety         General ADL Comments: pt may neeed increased level of A. Pt lives at [facility] with limited A               Cognition Arousal/Alertness: Awake/alert Behavior During Therapy: WFL for tasks assessed/performed Overall Cognitive Status: No family/caregiver present to determine baseline cognitive functioning                                                     Pertinent Vitals/ Pain       Pain Assessment: No/denies pain     Prior Functioning/Environment              Frequency  Min 2X/week        Progress Toward Goals  OT Goals(current goals can now be found in the care plan section)  Progress towards OT goals: Progressing toward goals         Co-evaluation                 AM-PAC PT "6 Clicks" Daily Activity     Outcome Measure   Help from another person eating meals?: None Help from another person taking care of personal grooming?: A Little Help from another person toileting, which includes using toliet, bedpan, or urinal?: A Little Help from another person bathing (including washing,  rinsing, drying)?: A Little Help from another person to put on and taking off regular upper body clothing?: A Little Help from another person to put on and taking off regular lower body clothing?: A Little 6 Click Score: 19    End of Session Equipment Utilized During Treatment: Gait belt;Rolling walker  OT Visit Diagnosis: Unsteadiness on feet (R26.81);Repeated falls (R29.6);Muscle weakness (generalized) (M62.81);History of falling (Z91.81)   Activity Tolerance Patient tolerated treatment well   Patient Left in chair;with call bell/phone within reach;with chair alarm set   Nurse Communication Mobility status        Time: 1145-1200 OT Time Calculation (min): 15 min  Charges: OT General Charges $OT Visit: 1 Visit OT Treatments $Self Care/Home Management : 8-22 mins  [place],  [place]   Betsy Pries 08/03/2017, 12:45 PM

## 2017-08-03 NOTE — H&P (View-Only) (Signed)
ORTHOPAEDIC CONSULTATION  REQUESTING PHYSICIAN: Florencia Reasons, MD  Chief Complaint: Pain swelling and chronic ulceration right great toe  HPI: Sara Griffith is a 81 y.o. female who presents with chronic osteomyelitis of the right great toe.  Patient states she underwent a left first ray amputation in [place] and has been currently going to podiatry for debridement of a Waggoner grade 1 ulcer beneath the second metatarsal head of her left foot.  She describes chronic redness pain and swelling of the right great toe.  Past Medical History:  Diagnosis Date  . CKD (chronic kidney disease)   . Diabetes mellitus without complication ([place])   . GERD (gastroesophageal reflux disease)   . HLD (hyperlipidemia)   . Hypertension   . IDA (iron deficiency anemia)   . PVD (peripheral vascular disease) ([place])   . RLS (restless legs syndrome)   . TIA (transient ischemic attack)   . Uses walker   . Vitamin D deficiency    Past Surgical History:  Procedure Laterality Date  . ABDOMINAL HYSTERECTOMY     with bladder tac  . APPENDECTOMY    . CARPAL TUNNEL RELEASE Right   . CATARACT EXTRACTION, BILATERAL    . CERVICAL DISC SURGERY    . COLONOSCOPY    . DILATION AND CURETTAGE OF UTERUS     x 2  . left great toe amputation    . ROTATOR CUFF REPAIR Left    Social History   Socioeconomic History  . Marital status: Widowed    Spouse name: None  . Number of children: None  . Years of education: None  . Highest education level: None  Social Needs  . Financial resource strain: None  . Food insecurity - worry: None  . Food insecurity - inability: None  . Transportation needs - medical: None  . Transportation needs - non-medical: None  Occupational History  . None  Tobacco Use  . Smoking status: Former Smoker    Years: 30.00    Types: Cigarettes  . Smokeless tobacco: Former Systems developer    Types: Snuff    Quit date: 07/01/1954  Substance and Sexual Activity  . Alcohol use: No  . Drug use: No  .  Sexual activity: None  Other Topics Concern  . None  Social History Narrative  . None   Family History  Problem Relation Age of Onset  . Hypertension Mother   . Cancer Father   . Arrhythmia Sister        ppm  . Arrhythmia Brother        ppm   - negative except otherwise stated in the family history section Allergies  Allergen Reactions  . Cymbalta [Duloxetine Hcl] Swelling  . Librax [Chlordiazepoxide-Clidinium] Swelling  . Lyrica [Pregabalin] Swelling  . Naproxen Swelling   Prior to Admission medications   Medication Sig Start Date End Date Taking? Authorizing Provider  acetaminophen (TYLENOL) 325 MG tablet Take 650 mg by mouth every 6 (six) hours as needed for mild pain, moderate pain or headache.   Yes [provider]  amLODipine (NORVASC) 5 MG tablet TAKE 1 TABLET (5 MG TOTAL) BY MOUTH DAILY. 04/01/17  Yes Bhagat, Bhavinkumar, PA  atorvastatin (LIPITOR) 40 MG tablet Take 40 mg by mouth daily.   Yes [provider]  cholecalciferol (VITAMIN D) 1000 units tablet Take 1,000 Units by mouth daily.   Yes [provider]  collagenase (SANTYL) ointment Apply 1 application topically daily.   Yes [provider]  folic acid (  FOLVITE) 1 MG tablet Take 0.5 mg by mouth daily.   Yes [provider]  levETIRAcetam (KEPPRA) 500 MG tablet Take 500 mg by mouth 2 (two) times daily.   Yes [provider]  linagliptin (TRADJENTA) 5 MG TABS tablet Take 5 mg by mouth daily.   Yes [provider]  losartan (COZAAR) 100 MG tablet Take 100 mg by mouth daily. 05/30/16  Yes [provider]  metoprolol tartrate (LOPRESSOR) 50 MG tablet Take 1 tablet (50 mg total) by mouth 2 (two) times daily with a meal. 07/04/16  Yes Eugenie Filler, MD  omega-3 acid ethyl esters (LOVAZA) 1 g capsule Take 1 g by mouth 3 (three) times daily.  06/28/16  Yes [provider]  omeprazole (PRILOSEC) 40 MG capsule Take 40 mg by mouth daily. 06/28/16   Yes [provider]  OVER THE COUNTER MEDICATION Place 1 drop into both eyes as needed (dry eyes). Over the counter dry eye drops.   Yes [provider]  pramipexole (MIRAPEX) 0.5 MG tablet Take 0.5 mg by mouth at bedtime.   Yes [provider]  psyllium (METAMUCIL) 58.6 % packet Take 1 packet by mouth daily as needed (constipation).   Yes [provider]  traMADol (ULTRAM) 50 MG tablet Take 50 mg by mouth 3 (three) times daily. 06/05/16  Yes [provider]  vitamin E 400 UNIT capsule Take 400 Units by mouth daily.   Yes [provider]   Ct Head Wo Contrast  Result Date: 08/02/2017 CLINICAL DATA:  Initial evaluation for acute altered mental status recent intracranial hemorrhage. EXAM: CT HEAD WITHOUT CONTRAST TECHNIQUE: Contiguous axial images were obtained from the base of the skull through the vertex without intravenous contrast. COMPARISON:  Prior CT from 08/01/2017. FINDINGS: Brain: Stable atrophy with chronic microvascular ischemic disease. Small volume extra-axial hemorrhage positioned along the interhemispheric fissure anteriorly again seen, stable to slightly decreased relative to previous exam (series 2, image 17). No significant mass effect. No evidence for new or interval hemorrhage. No evidence for acute large vessel territory infarct. No mass lesion or midline shift. Stable ventricular prominence related to global parenchymal volume loss without hydrocephalus. No extra-axial fluid collection. Vascular: No hyperdense vessel. Scattered vascular calcifications noted within the carotid siphons. Skull: Skin staples in place at the left parietal scalp. Scalp soft tissues otherwise unremarkable. Calvarium intact. Sinuses/Orbits: Globes and orbital soft tissues within normal limits. Visualized paranasal sinuses and mastoids are clear. Other: None. IMPRESSION: 1. Stable 2 slightly decreased small volume extra-axial hemorrhage along the  interhemispheric fissure as compared to previous. No mass effect. 2. No other new intracranial abnormality. 3. Stable atrophy with chronic small vessel ischemic disease. Electronically Signed   By: Jeannine Boga M.D.   On: 08/02/2017 21:51   Mr Foot Left Wo Contrast  Result Date: 08/03/2017 CLINICAL DATA:  Left foot swelling and pain in a diabetic patient with an extensive history of foot ulcers. History of prior left great toe amputation. EXAM: MRI OF THE LEFT FOOT WITHOUT CONTRAST TECHNIQUE: Multiplanar, multisequence MR imaging of the left foot was performed. No intravenous contrast was administered. COMPARISON:  MRI left foot 08/01/2017. FINDINGS: Bones/Joint/Cartilage The patient is status post amputation at the level of the base of the first metatarsal. No bone marrow signal abnormality to suggest osteomyelitis is identified. No fracture or stress change. Ligaments Intact. Muscles and Tendons Marked atrophy of intrinsic musculature the foot is present. No strain or tear. No evidence of infectious or inflammatory  process. Soft tissues No abscess is identified. IMPRESSION: Negative for osteomyelitis.  No acute abnormality. Status amputation of the first ray at the level of the base of the first metatarsal. Electronically Signed   By: Inge Rise M.D.   On: 08/03/2017 09:39   - pertinent xrays, CT, MRI studies were reviewed and independently interpreted  Positive ROS: All other systems have been reviewed and were otherwise negative with the exception of those mentioned in the HPI and as above.  Physical Exam: General: Alert, no acute distress, patient does have some mild dementia. Psychiatric: Patient is competent for consent with normal mood and affect Lymphatic: No axillary or cervical lymphadenopathy Cardiovascular: No pedal edema Respiratory: No cyanosis, no use of accessory musculature GI: No organomegaly, abdomen is soft and non-tender  Skin: Left foot she has a Waggoner grade  1 ulcer beneath the second metatarsal head.  Right foot she has swelling ulceration and cellulitis of the right great toe.      Neurologic: Patient does not have protective sensation bilateral lower extremities.   MUSCULOSKELETAL:   She has fixed clawing of the lesser toes left foot, she does not have a heel cord contractures she has a well-healed first ray amputation of the left foot.  Examination the right foot she has a good dorsalis pedis pulse she has swelling ulceration and cellulitis of the right great toe.  Review of the MRI scan shows chronic osteomyelitis of the tuft of the great toe.  Assessment: Assessment:   1.Status post left foot first ray amputation with fixed clawing of the lesser toes and a Waggoner grade 1 ulcer 10 mm in diameter beneath the second metatarsal head.    2.Osteomyelitis right great toe, with Waggoner grade 3 ulceration, with a good dorsalis pedis pulse.  Plan: Continue IV antibiotics through this week, discontinue antibiotics Saturday morning.  Plan for right foot great toe amputation at the MTP joint on Friday at [facility].  Patient will need to be transferred to [facility].  Patient currently lives in a retirement community I do not know if they have skilled nursing available.  Patient will need discharge to skilled nursing for approximately 20 days.  She will be nonweightbearing on the right foot and weightbearing on the left heel for transfers only for approximately 3 weeks.   Thank you for the consult and the opportunity to see Sara Griffith, [place] [phone] 5:58 PM

## 2017-08-03 NOTE — Consult Note (Signed)
[facility] Nurse wound consult note Reason for Consult: bilateral neuropathic foot wounds (chronic, non-healing) Wound type:Neuropathic, infectious Pressure Injury POA: N/A Measurement: Right great toe:  1.5cm x 1cm x 0.2cm with surrounding edema, scant exudate, serous. Dry peeling periphery. Left plantar aspect, 2nd metatarsal head:  1cm round x 0.2cm with surrounding callous. Red wound bed, scant drainage. Bilateral knees and anterior LEs with abrasions from crawling on the carpet after falling. Partial thickness injuries, light yellow exudate. Largest area is on right and measures 1.5cm x 2cm with 0.1cm depth. Top of head:4cm laceration that is approximated with staples placed on 07/24/17. Dried blood in hair.  Wound bed: As noted above Drainage (amount, consistency, odor) As noted above Periwound:AS noted above Dressing procedure/placement/frequency: Dr. Eather Colas has been consulted for definitive POC regarding osteomyelitis (re: amputation). In the meantime, I have discontinued the use of collagenase (Santyl) in favor of saline dressings twice daily.  The abrasions on her knees are to be treated with xeroform gauze and I have asked Nursing to remove the occipital staples (they are 10 days post application).  [facility] nursing team will not follow, but will remain available to this patient, the nursing and medical teams.  Please re-consult if needed. Thanks, Maudie Flakes, MSN, RN, [place], Arther Abbott  Pager# [phone]

## 2017-08-04 ENCOUNTER — Encounter (HOSPITAL_COMMUNITY): Payer: Self-pay | Admitting: *Deleted

## 2017-08-04 LAB — BASIC METABOLIC PANEL
Anion gap: 8 (ref 5–15)
BUN: 13 mg/dL (ref 6–20)
CO2: 22 mmol/L (ref 22–32)
CREATININE: 1.26 mg/dL — AB (ref 0.44–1.00)
Calcium: 8.2 mg/dL — ABNORMAL LOW (ref 8.9–10.3)
Chloride: 104 mmol/L (ref 101–111)
GFR calc Af Amer: 46 mL/min — ABNORMAL LOW (ref >60)
GFR, EST NON AFRICAN AMERICAN: 39 mL/min — AB (ref >60)
Glucose, Bld: 186 mg/dL — ABNORMAL HIGH (ref 65–99)
Potassium: 3.6 mmol/L (ref 3.5–5.1)
SODIUM: 134 mmol/L — AB (ref 135–145)

## 2017-08-04 LAB — GLUCOSE, CAPILLARY
GLUCOSE-CAPILLARY: 146 mg/dL — AB (ref 65–99)
Glucose-Capillary: 156 mg/dL — ABNORMAL HIGH (ref 65–99)
Glucose-Capillary: 137 mg/dL — ABNORMAL HIGH (ref 65–99)
Glucose-Capillary: 158 mg/dL — ABNORMAL HIGH (ref 65–99)

## 2017-08-04 LAB — CK: CK TOTAL: 679 U/L — AB (ref 38–234)

## 2017-08-04 NOTE — Progress Notes (Signed)
Physical Therapy Treatment Patient Details Name: Sara Griffith MRN: [id number] DOB: 08-Jan-1936 Today's Date: 08/04/2017    History of Present Illness  Sara Griffith is a 81 y.o. female with medical history significant of hypertension, hyperlipidemia, diabetes mellitus, TIA, GERD, RLS, PVD, CKD-III, recent fall which caused small ICH, who presents with fall and left groin pain.     PT Comments    Patient progressing with ambulation distance and with ability to maneuver different walker during session.  Remains at risk for falls so would still benefit from SNF level rehab stay for balance and safety due to multiple recent falls and difficulty with medication management.    Follow Up Recommendations  SNF;Supervision/Assistance - 24 hour     Equipment Recommendations  None recommended by PT    Recommendations for Other Services       Precautions / Restrictions Precautions Precautions: Fall Precaution Comments: Frequent faller. Medication changes have caused multiple falls in past. Restrictions Weight Bearing Restrictions: No    Mobility  Bed Mobility               General bed mobility comments: in recliner  Transfers Overall transfer level: Needs assistance Equipment used: Rolling walker (2 wheeled) Transfers: Sit to/from Stand Sit to Stand: Min guard         General transfer comment: cue to use rail for toileting, but placed hand on seat to sit; from recliner used armrests  Ambulation/Gait Ambulation/Gait assistance: Supervision;Min guard;Min assist Ambulation Distance (Feet): 400 Feet Assistive device: Rolling walker (2 wheeled) Gait Pattern/deviations: Step-through pattern;Decreased stride length;Drifts right/left;Shuffle     General Gait Details: initially assist to guide walker due to pt used to rollator and RW veering to L; then able to manage with S after increased distance   Stairs            Wheelchair Mobility    Modified Rankin (Stroke  Patients Only)       Balance Overall balance assessment: Needs assistance Sitting-balance support: No upper extremity supported;Feet supported Sitting balance-Leahy Scale: Fair     Standing balance support: No upper extremity supported;During functional activity Standing balance-Leahy Scale: Fair Standing balance comment: toileting with S                             Cognition Arousal/Alertness: Awake/alert Behavior During Therapy: WFL for tasks assessed/performed Overall Cognitive Status: No family/caregiver present to determine baseline cognitive functioning                                        Exercises      General Comments        Pertinent Vitals/Pain Pain Assessment: No/denies pain    Home Living                      Prior Function            PT Goals (current goals can now be found in the care plan section) Progress towards PT goals: Progressing toward goals    Frequency    Min 3X/week      PT Plan Current plan remains appropriate    Co-evaluation              AM-PAC PT "6 Clicks" Daily Activity  Outcome Measure  Difficulty turning over in bed (including adjusting bedclothes, sheets and blankets)?: A  Little Difficulty moving from lying on back to sitting on the side of the bed? : Unable Difficulty sitting down on and standing up from a chair with arms (e.g., wheelchair, bedside commode, etc,.)?: Unable Help needed moving to and from a bed to chair (including a wheelchair)?: A Little Help needed walking in hospital room?: A Little Help needed climbing 3-5 steps with a railing? : A Little 6 Click Score: 14    End of Session Equipment Utilized During Treatment: Gait belt Activity Tolerance: Patient tolerated treatment well Patient left: in chair;with call bell/phone within reach;with nursing/sitter in room   PT Visit Diagnosis: Other abnormalities of gait and mobility (R26.89);Muscle weakness  (generalized) (M62.81);History of falling (Z91.81)     Time: 0071-2197 PT Time Calculation (min) (ACUTE ONLY): 31 min  Charges:  $Gait Training: 8-22 mins $Therapeutic Activity: 8-22 mins                    G CodesMagda Kiel, [place] [phone] 08/04/2017    Reginia Naas 08/04/2017, 12:49 PM

## 2017-08-04 NOTE — Progress Notes (Signed)
Pt has just left via [place] to [facility]

## 2017-08-04 NOTE — Progress Notes (Signed)
PROGRESS NOTE  Sara Griffith:[id number] DOB: Jan 03, 1936 DOA: 07/31/2017 PCP: Kathyrn Lass, MD  HPI/Recap of past 24 hours:  Denies pain, sitting up in chair   Assessment/Plan: Principal Problem:   Fall Active Problems:   RLS (restless legs syndrome)   GERD (gastroesophageal reflux disease)   HLD (hyperlipidemia)   HTN (hypertension)   Type II diabetes mellitus with renal manifestations ([place])   Left groin pain   Rhabdomyolysis   History of intracranial hemorrhage   Acute renal failure superimposed on stage 3 chronic kidney disease (HCC)   Ulcer of toe of right foot (HCC)   Chronic osteomyelitis of toe, right ([place])   Frequent falls ( recent hospitalized for ICH from a fall)  Rhabdomyolysis:  -she fell at her apartment, and lay on the floor for a day -CK 2296 on presentation,  -ck /cr improving on IVF, continue ivf for another 24hrs   AoCKD-III:  -Baseline Cre is 1.2, pt's Cre is 1.84 and BUN 28 on admission.  -Likely due to prerenal secondary to rhabdo, dehydration, continuation of ARB --cr improving, ivf for another 24hrs,  -  Hold cozaar, renal dosing meds   noninsulin dependent Type II diabetes mellitus with renal manifestations ([place]): Last A1c 8.0 on 10/21/16, poorly controled. Patient is taking Tradjenta at home -SSI    Bilateral diabetic foot ulcers Right toe ulcers concerning for underline infection, she reports recently finished  A course of abx for this Mri right foot confirmed "osteomyelitis of the first distal phalanx", start vanc/zosyn  left foot mri no deep infection ABI unremarkable Ortho Dr Sharol Given request patient to be transferred to [facility] for surgery on Friday.  Transfer order placed, flow manager notified.   Code Status: DNR  Family Communication: patient and daughter who is a Architectural technologist   Disposition Plan: transfer from [place] to [place] for surgery likely will need  SNF placement   Consultants:  Ortho Dr  Sharol Given  Procedures:  none  Antibiotics:  Vanc/zosyn started on 12/2   Objective: BP (!) 148/68 (BP Location: Left Arm)   Pulse 85   Temp 98.8 F (37.1 C) (Oral)   Resp 18   Ht 4\' 11"  (1.499 m)   Wt 61.2 kg (135 lb)   LMP  (LMP Unknown)   SpO2 95%   BMI 27.27 kg/m   Intake/Output Summary (Last 24 hours) at 08/04/2017 1104 Last data filed at 08/04/2017 2409 Gross per 24 hour  Intake 2738.75 ml  Output 2750 ml  Net -11.25 ml   Filed Weights   07/31/17 1718  Weight: 61.2 kg (135 lb)    Exam: Patient is examined daily including today on 08/04/2017, exams remain the same as of yesterday except that has changed    General:  NAD  Cardiovascular: RRR  Respiratory: CTABL  Abdomen: Soft/ND/NT, positive BS  Musculoskeletal: right big toe with ulceration plantar surface, edematous and erythema entire right big toe. Right foot first MTP planter surface ulcer with scan drainage, no odor, decreased sensation both feet. Left foot deformity  Neuro: alert, oriented   Data Reviewed: Basic Metabolic Panel: Recent Labs  Lab 07/31/17 1919 08/01/17 0500 08/02/17 0554 08/03/17 0550 08/04/17 0549  NA 134* 135 135 137 134*  K 4.5 5.0 4.2 4.1 3.6  CL 102 108 107 112* 104  CO2 21* 20* 20* 20* 22  GLUCOSE 123* 120* 128* 124* 186*  BUN 28* 24* 18 14 13   CREATININE 1.84* 1.87* 1.49* 1.34* 1.26*  CALCIUM 9.2 8.3*  8.3* 8.0* 8.2*   Liver Function Tests: Recent Labs  Lab 07/31/17 1919 08/02/17 0554 08/03/17 0550  AST 51* 44* 32  ALT 23 20 18   ALKPHOS 81 64 59  BILITOT 0.4 0.5 0.4  PROT 7.9 6.6 6.0*  ALBUMIN 3.3* 2.6* 2.4*   Recent Labs  Lab 07/31/17 1919  LIPASE 36   No results for input(s): AMMONIA in the last 168 hours. CBC: Recent Labs  Lab 07/31/17 1919 08/01/17 0500 08/02/17 0554 08/03/17 0550  WBC 11.8* 8.8 8.9 8.1  NEUTROABS  --   --  5.8 4.9  HGB 11.4* 9.5* 9.6* 9.0*  HCT 33.7* 28.9* 29.1* 27.6*  MCV 88.7 89.8 89.8 90.5  PLT 299 320 294 270    Cardiac Enzymes:   Recent Labs  Lab 07/31/17 1919 08/01/17 0500 08/02/17 0554 08/03/17 0550 08/04/17 0549  CKTOTAL 2,296* 1,794* 1,514* 775* 679*   BNP (last 3 results) Recent Labs    10/21/16 1437  BNP 295.2*    ProBNP (last 3 results) No results for input(s): PROBNP in the last 8760 hours.  CBG: Recent Labs  Lab 08/03/17 0719 08/03/17 1113 08/03/17 1618 08/03/17 2100 08/04/17 0748  GLUCAP 117* 152* 143* 172* 146*    No results found for this or any previous visit (from the past 240 hour(s)).   Studies: No results found.  Scheduled Meds: . amLODipine  5 mg Oral Daily  . atorvastatin  40 mg Oral Daily  . cholecalciferol  1,000 Units Oral Daily  . folic acid  0.5 mg Oral Daily  . insulin aspart  0-5 Units Subcutaneous QHS  . insulin aspart  0-9 Units Subcutaneous TID WC  . levETIRAcetam  500 mg Oral BID  . metoprolol tartrate  50 mg Oral BID WC  . omega-3 acid ethyl esters  1 g Oral TID  . pantoprazole  40 mg Oral Daily  . pramipexole  0.5 mg Oral QHS  . vitamin E  400 Units Oral Daily    Continuous Infusions: . sodium chloride 50 mL/hr at 08/03/17 2256  . piperacillin-tazobactam (ZOSYN)  IV 3.375 g (08/04/17 0901)  . vancomycin       Time spent: 45mins  I have personally reviewed and interpreted on  08/04/2017 daily labs, imagings as discussed above under date review session and assessment and plans.  I reviewed all nursing notes, pharmacy notes,  vitals, pertinent old records  I have discussed plan of care as described above with RN , patient and family on 08/04/2017   Florencia Reasons MD, PhD  [facility] Pager [phone]. If 7PM-7AM, please contact night-coverage at [email or web address], password [facility] 08/04/2017, 11:04 AM  LOS: 3 days

## 2017-08-04 NOTE — Progress Notes (Signed)
Report given to [place] at [facility] at [facility]. Patient will be transported via carelink.

## 2017-08-04 NOTE — Care Management Important Message (Signed)
Important Message  Patient Details  Name: Sara Griffith MRN: [id number] Date of Birth: 05-05-1936   Medicare Important Message Given:  Yes    Kerin Salen 08/04/2017, 9:54 AMImportant Message  Patient Details  Name: Sara Griffith MRN: [id number] Date of Birth: April 24, 1936   Medicare Important Message Given:  Yes    Kerin Salen 08/04/2017, 9:54 AM

## 2017-08-05 ENCOUNTER — Other Ambulatory Visit (INDEPENDENT_AMBULATORY_CARE_PROVIDER_SITE_OTHER): Payer: Self-pay | Admitting: Family

## 2017-08-05 DIAGNOSIS — T796XXA Traumatic ischemia of muscle, initial encounter: Secondary | ICD-10-CM

## 2017-08-05 DIAGNOSIS — W19XXXA Unspecified fall, initial encounter: Secondary | ICD-10-CM

## 2017-08-05 LAB — GLUCOSE, CAPILLARY
GLUCOSE-CAPILLARY: 157 mg/dL — AB (ref 65–99)
Glucose-Capillary: 173 mg/dL — ABNORMAL HIGH (ref 65–99)
Glucose-Capillary: 153 mg/dL — ABNORMAL HIGH (ref 65–99)
Glucose-Capillary: 177 mg/dL — ABNORMAL HIGH (ref 65–99)

## 2017-08-05 MED ORDER — SODIUM CHLORIDE 0.9 % IV SOLN
INTRAVENOUS | Status: DC
  Administered 2017-08-05: 18:00:00 via INTRAVENOUS

## 2017-08-05 NOTE — Progress Notes (Signed)
PROGRESS NOTE    Lichelle Viets  BMW:[id number] DOB: 10/30/1935 DOA: 07/31/2017 PCP: Kathyrn Lass, MD     Brief Narrative:  Sara Griffith is a 81 y.o. female with medical history significant of hypertension, hyperlipidemia, diabetes mellitus, TIA, GERD, RLS, PVD, CKD-III, recent fall which caused small ICH, who presents with fall and left groin pain. Pt states that she slid off the couch when she was trying to go to the bathroom around 7 AM. She could not get up until she was found by the home health nurse who visited her this afternoon. She states that she has pain in the left groin area which is constant, sharp, 5 out of 10 in severity, nonradiating. She was admitted for treatment of rhabdomyolysis, AKI, as well as diabetic foot ulcers.   Of note, patient fell November 23 while visiting family and was admitted to the hospital in [place] where she had a scalp laceration and small ICH. She was admitted to the ICU and discharged back to her facility.  Assessment & Plan:   Principal Problem:   Fall Active Problems:   RLS (restless legs syndrome)   GERD (gastroesophageal reflux disease)   HLD (hyperlipidemia)   HTN (hypertension)   Type II diabetes mellitus with renal manifestations (HCC)   Left groin pain   Rhabdomyolysis   History of intracranial hemorrhage   Acute renal failure superimposed on stage 3 chronic kidney disease (HCC)   Ulcer of toe of right foot (HCC)   Chronic osteomyelitis of toe, right (HCC)   Rhabdomyolysis after mechanical fall  -IVF, improving CK level   AKI on CKD stage III -Baseline Cr is1.2 -Likely due to prerenal secondary to rhabdo, dehydration, continuation of ARB -Holdcozaar -Improving, trend BMP   Bilateral diabetic foot ulcers -Left foot MRI negative for osteomyelitis  -Right great toe +osteomyelitis of first distal phalanx  -ABI unremarkable -Plan for right great toe amputation on Friday  -Continue vanco/zosyn   Noninsulin dependent Type  II diabetes mellitus with renal manifestations -Ha1c 7.4. Patient is takingTradjentaat home -SSI  Recent fall with small ICH -CT head showed stable slightly decreased extra-axial hemorrhage along the interhemispheric fissure as compared to previous -Continue keppra   HTN -Continue norvasc, lopressor   HLD -Continue lipitor    DVT prophylaxis: SCD  Code Status: DNR Family Communication: No family at bedside Disposition Plan:  Pending improvement, surgical course   Consultants:   Orthopedic surgery   Procedures:   None   Antimicrobials:  Anti-infectives (From admission, onward)   Start     Dose/Rate Route Frequency Ordered Stop   08/04/17 1800  vancomycin (VANCOCIN) IVPB 750 mg/150 ml premix     750 mg 150 mL/hr over 60 Minutes Intravenous Every 48 hours 08/02/17 1717     08/02/17 1800  vancomycin (VANCOCIN) 1,250 mg in sodium chloride 0.9 % 250 mL IVPB     1,250 mg 166.7 mL/hr over 90 Minutes Intravenous  Once 08/02/17 1717 08/02/17 1949   08/02/17 1800  piperacillin-tazobactam (ZOSYN) IVPB 3.375 g     3.375 g 12.5 mL/hr over 240 Minutes Intravenous Every 8 hours 08/02/17 1717         Subjective: Doing well this morning.  No acute events, no new complaints.  Objective: Vitals:   08/04/17 1310 08/04/17 2137 08/05/17 0448 08/05/17 0738  BP: (!) 138/53 (!) 161/69 (!) 133/58 140/63  Pulse: 71 77 72 60  Resp: 18 18 18 18   Temp: 98.1 F (36.7 C) 98 F (36.7 C) 98.3  F (36.8 C) 98.5 F (36.9 C)  TempSrc: Oral Oral Oral Oral  SpO2: 95% 96% 95% 96%  Weight:      Height:        Intake/Output Summary (Last 24 hours) at 08/05/2017 1643 Last data filed at 08/05/2017 1135 Gross per 24 hour  Intake 140 ml  Output 1100 ml  Net -960 ml   Filed Weights   07/31/17 1718  Weight: 61.2 kg (135 lb)    Examination:  General exam: Appears calm and comfortable  Respiratory system: Clear to auscultation. Respiratory effort normal. Cardiovascular system: S1 & S2  heard, RRR. No JVD, murmurs, rubs, gallops or clicks. No pedal edema. Gastrointestinal system: Abdomen is nondistended, soft and nontender. No organomegaly or masses felt. Normal bowel sounds heard. Central nervous system: Alert and oriented. No focal neurological deficits. Extremities: +left great toe amputated  Skin: +left first metatarsal head ulcer with clean base, +right great toe ulcer on plantar surface  Psychiatry: Judgement and insight appear normal. Mood & affect appropriate.   Data Reviewed: I have personally reviewed following labs and imaging studies  CBC: Recent Labs  Lab 07/31/17 1919 08/01/17 0500 08/02/17 0554 08/03/17 0550  WBC 11.8* 8.8 8.9 8.1  NEUTROABS  --   --  5.8 4.9  HGB 11.4* 9.5* 9.6* 9.0*  HCT 33.7* 28.9* 29.1* 27.6*  MCV 88.7 89.8 89.8 90.5  PLT 299 320 294 962   Basic Metabolic Panel: Recent Labs  Lab 07/31/17 1919 08/01/17 0500 08/02/17 0554 08/03/17 0550 08/04/17 0549  NA 134* 135 135 137 134*  K 4.5 5.0 4.2 4.1 3.6  CL 102 108 107 112* 104  CO2 21* 20* 20* 20* 22  GLUCOSE 123* 120* 128* 124* 186*  BUN 28* 24* 18 14 13   CREATININE 1.84* 1.87* 1.49* 1.34* 1.26*  CALCIUM 9.2 8.3* 8.3* 8.0* 8.2*   GFR: Estimated Creatinine Clearance: 28.8 mL/min (A) (by C-G formula based on SCr of 1.26 mg/dL (H)). Liver Function Tests: Recent Labs  Lab 07/31/17 1919 08/02/17 0554 08/03/17 0550  AST 51* 44* 32  ALT 23 20 18   ALKPHOS 81 64 59  BILITOT 0.4 0.5 0.4  PROT 7.9 6.6 6.0*  ALBUMIN 3.3* 2.6* 2.4*   Recent Labs  Lab 07/31/17 1919  LIPASE 36   No results for input(s): AMMONIA in the last 168 hours. Coagulation Profile: No results for input(s): INR, PROTIME in the last 168 hours. Cardiac Enzymes: Recent Labs  Lab 07/31/17 1919 08/01/17 0500 08/02/17 0554 08/03/17 0550 08/04/17 0549  CKTOTAL 2,296* 1,794* 1,514* 775* 679*   BNP (last 3 results) No results for input(s): PROBNP in the last 8760 hours. HbA1C: No results for  input(s): HGBA1C in the last 72 hours. CBG: Recent Labs  Lab 08/04/17 1145 08/04/17 1634 08/04/17 2041 08/05/17 0732 08/05/17 1156  GLUCAP 156* 137* 158* 157* 173*   Lipid Profile: No results for input(s): CHOL, HDL, LDLCALC, TRIG, CHOLHDL, LDLDIRECT in the last 72 hours. Thyroid Function Tests: No results for input(s): TSH, T4TOTAL, FREET4, T3FREE, THYROIDAB in the last 72 hours. Anemia Panel: No results for input(s): VITAMINB12, FOLATE, FERRITIN, TIBC, IRON, RETICCTPCT in the last 72 hours. Sepsis Labs: No results for input(s): PROCALCITON, LATICACIDVEN in the last 168 hours.  No results found for this or any previous visit (from the past 240 hour(s)).     Radiology Studies: No results found.    Scheduled Meds: . amLODipine  5 mg Oral Daily  . atorvastatin  40 mg Oral Daily  .  cholecalciferol  1,000 Units Oral Daily  . folic acid  0.5 mg Oral Daily  . insulin aspart  0-5 Units Subcutaneous QHS  . insulin aspart  0-9 Units Subcutaneous TID WC  . levETIRAcetam  500 mg Oral BID  . metoprolol tartrate  50 mg Oral BID WC  . omega-3 acid ethyl esters  1 g Oral TID  . pantoprazole  40 mg Oral Daily  . pramipexole  0.5 mg Oral QHS  . vitamin E  400 Units Oral Daily   Continuous Infusions: . sodium chloride    . piperacillin-tazobactam (ZOSYN)  IV 3.375 g (08/05/17 1135)  . vancomycin Stopped (08/04/17 1832)     LOS: 4 days    Time spent: 40 minutes   Dessa Phi, DO [facility] [email or web address] Password [facility] 08/05/2017, 4:43 PM

## 2017-08-05 NOTE — Progress Notes (Signed)
Patient ID: Sara Griffith, female   DOB: Sep 27, 1935, 81 y.o.   MRN: [id number] Plan for a right great toe amputation on Friday.  The orthopedic discharge plan of care will be included on the operative note.

## 2017-08-05 NOTE — Progress Notes (Signed)
Pharmacy Antibiotic Note  Sara Griffith is a 81 y.o. female admitted on 07/31/2017 with bilateral diabetic foot ulcers with concern for osteomyelitis. Pharmacy has been consulted for vancomycin and Zosyn dosing.  Plan is for patient to have R great toe amputation on Friday. SCr has improved to 1.26.  Plan: Vancomycin 750 mg q48h. Goal trough 15-40mcg/mL Zosyn 3.375g IV q8h EI Daily BMET Plan per Dr. Jess Barters note on 12/3 is for antibiotics through this week and d/c on Saturday AM   Height: 4\' 11"  (149.9 cm) Weight: 135 lb (61.2 kg) IBW/kg (Calculated) : 43.2  Temp (24hrs), Avg:98.3 F (36.8 C), Min:98 F (36.7 C), Max:98.5 F (36.9 C)  Recent Labs  Lab 07/31/17 1919 08/01/17 0500 08/02/17 0554 08/03/17 0550 08/04/17 0549  WBC 11.8* 8.8 8.9 8.1  --   CREATININE 1.84* 1.87* 1.49* 1.34* 1.26*    Estimated Creatinine Clearance: 28.8 mL/min (A) (by C-G formula based on SCr of 1.26 mg/dL (H)).    Allergies  Allergen Reactions  . Cymbalta [Duloxetine Hcl] Swelling  . Librax [Chlordiazepoxide-Clidinium] Swelling  . Lyrica [Pregabalin] Swelling  . Naproxen Swelling    Antimicrobials this admission: 12/2 Zosyn >>  12/2 Vancomycin >>   Dose adjustments this admission: n/a   Microbiology results: None  Thank you for allowing pharmacy to be a part of this patient's care.  Keiran Gaffey D. Dawson Albers, PharmD, BCPS Clinical Pharmacist Clinical Phone for 08/05/2017 until 3:30pm: [phone] If after 3:30pm, please call main pharmacy at [phone] 08/05/2017 1:56 PM

## 2017-08-06 LAB — BASIC METABOLIC PANEL
ANION GAP: 9 (ref 5–15)
BUN: 13 mg/dL (ref 6–20)
CALCIUM: 8.2 mg/dL — AB (ref 8.9–10.3)
CO2: 24 mmol/L (ref 22–32)
Chloride: 103 mmol/L (ref 101–111)
Creatinine, Ser: 1.34 mg/dL — ABNORMAL HIGH (ref 0.44–1.00)
GFR, EST AFRICAN AMERICAN: 42 mL/min — AB (ref >60)
GFR, EST NON AFRICAN AMERICAN: 37 mL/min — AB (ref >60)
Glucose, Bld: 177 mg/dL — ABNORMAL HIGH (ref 65–99)
Potassium: 3.5 mmol/L (ref 3.5–5.1)
Sodium: 136 mmol/L (ref 135–145)

## 2017-08-06 LAB — SURGICAL PCR SCREEN
MRSA, PCR: NEGATIVE
STAPHYLOCOCCUS AUREUS: NEGATIVE

## 2017-08-06 LAB — GLUCOSE, CAPILLARY
GLUCOSE-CAPILLARY: 149 mg/dL — AB (ref 65–99)
GLUCOSE-CAPILLARY: 126 mg/dL — AB (ref 65–99)
Glucose-Capillary: 265 mg/dL — ABNORMAL HIGH (ref 65–99)
Glucose-Capillary: 161 mg/dL — ABNORMAL HIGH (ref 65–99)

## 2017-08-06 LAB — CBC
HCT: 32 % — ABNORMAL LOW (ref 36.0–46.0)
HEMOGLOBIN: 10.4 g/dL — AB (ref 12.0–15.0)
MCH: 29 pg (ref 26.0–34.0)
MCHC: 32.5 g/dL (ref 30.0–36.0)
MCV: 89.1 fL (ref 78.0–100.0)
Platelets: 311 10*3/uL (ref 150–400)
RBC: 3.59 MIL/uL — AB (ref 3.87–5.11)
RDW: 13.7 % (ref 11.5–15.5)
WBC: 9.2 10*3/uL (ref 4.0–10.5)

## 2017-08-06 NOTE — Progress Notes (Signed)
PT Cancellation Note  Patient Details Name: Sara Griffith MRN: [id number] DOB: 04-02-1936   Cancelled Treatment:    Reason Eval/Treat Not Completed: Patient declined, no reason specified. Pt refusing to participate in therapy session at this time, stating "I haven't got woken up yet". PT encouraged mobility to help pt with arousal; however, pt refusing. PT will continue to f/u with pt as available.    [place] 08/06/2017, 9:05 AM

## 2017-08-06 NOTE — Progress Notes (Signed)
PROGRESS NOTE    Sara Griffith  JKK:[id number] DOB: 31-May-1936 DOA: 07/31/2017 PCP: Kathyrn Lass, MD     Brief Narrative:  Sara Griffith is a 81 y.o. female with medical history significant of hypertension, hyperlipidemia, diabetes mellitus, TIA, GERD, RLS, PVD, CKD-III, recent fall which caused small ICH, who presents with fall and left groin pain. Pt states that she slid off the couch when she was trying to go to the bathroom around 7 AM. She could not get up until she was found by the home health nurse who visited her this afternoon. She states that she has pain in the left groin area which is constant, sharp, 5 out of 10 in severity, nonradiating. She was admitted for treatment of rhabdomyolysis, AKI, as well as diabetic foot ulcers.   Of note, patient fell November 23 while visiting family and was admitted to the hospital in [place] where she had a scalp laceration and small ICH. She was admitted to the ICU and discharged back to her facility.  Assessment & Plan:   Principal Problem:   Fall Active Problems:   RLS (restless legs syndrome)   GERD (gastroesophageal reflux disease)   HLD (hyperlipidemia)   HTN (hypertension)   Type II diabetes mellitus with renal manifestations (HCC)   Left groin pain   Rhabdomyolysis   History of intracranial hemorrhage   Acute renal failure superimposed on stage 3 chronic kidney disease (HCC)   Ulcer of toe of right foot (HCC)   Chronic osteomyelitis of toe, right (HCC)   Rhabdomyolysis after mechanical fall  -IVF, improving CK level   AKI on CKD stage III -Baseline Cr is1.2 -Likely due to prerenal secondary to rhabdo, dehydration, continuation of ARB -Holdcozaar -Cr stable now   Bilateral diabetic foot ulcers -Left foot MRI negative for osteomyelitis  -Right great toe +osteomyelitis of first distal phalanx  -ABI unremarkable -Plan for right great toe amputation on Friday  -Continue vanco/zosyn   Noninsulin dependent Type II  diabetes mellitus with renal manifestations -Ha1c 7.4. Patient is takingTradjentaat home -SSI  Recent fall with small ICH -CT head showed stable slightly decreased extra-axial hemorrhage along the interhemispheric fissure as compared to previous -Continue keppra   HTN -Continue norvasc, lopressor   HLD -Continue lipitor    DVT prophylaxis: SCD  Code Status: DNR Family Communication: No family at bedside Disposition Plan:  Pending improvement, surgical course   Consultants:   Orthopedic surgery   Procedures:   None   Antimicrobials:  Anti-infectives (From admission, onward)   Start     Dose/Rate Route Frequency Ordered Stop   08/04/17 1800  vancomycin (VANCOCIN) IVPB 750 mg/150 ml premix     750 mg 150 mL/hr over 60 Minutes Intravenous Every 48 hours 08/02/17 1717     08/02/17 1800  vancomycin (VANCOCIN) 1,250 mg in sodium chloride 0.9 % 250 mL IVPB     1,250 mg 166.7 mL/hr over 90 Minutes Intravenous  Once 08/02/17 1717 08/02/17 1949   08/02/17 1800  piperacillin-tazobactam (ZOSYN) IVPB 3.375 g     3.375 g 12.5 mL/hr over 240 Minutes Intravenous Every 8 hours 08/02/17 1717         Subjective: Doing well this morning.  No acute events, no new complaints.  Objective: Vitals:   08/05/17 1652 08/05/17 2300 08/06/17 0439 08/06/17 1000  BP: (!) 142/58 138/62 (!) 134/54 (!) 157/61  Pulse: 69 68 69 74  Resp: 18 17 15 15   Temp: 98 F (36.7 C) 98.6 F (37 C) 98.7  F (37.1 C) 98.2 F (36.8 C)  TempSrc: Oral Oral Oral Oral  SpO2: 96% 95% 95% 96%  Weight:   61.5 kg (135 lb 9.3 oz)   Height:        Intake/Output Summary (Last 24 hours) at 08/06/2017 1412 Last data filed at 08/06/2017 1033 Gross per 24 hour  Intake 1148.33 ml  Output 925 ml  Net 223.33 ml   Filed Weights   07/31/17 1718 08/06/17 0439  Weight: 61.2 kg (135 lb) 61.5 kg (135 lb 9.3 oz)    Examination:  General exam: Appears calm and comfortable  Respiratory system: Clear to auscultation.  Respiratory effort normal. Cardiovascular system: S1 & S2 heard, RRR. No JVD, murmurs, rubs, gallops or clicks. No pedal edema. Gastrointestinal system: Abdomen is nondistended, soft and nontender. No organomegaly or masses felt. Normal bowel sounds heard. Central nervous system: Alert and oriented. No focal neurological deficits. Extremities: +left great toe amputated  Skin: +left first metatarsal head ulcer with clean base, +right great toe ulcer on plantar surface  Psychiatry: Judgement and insight appear normal. Mood & affect appropriate.   Data Reviewed: I have personally reviewed following labs and imaging studies  CBC: Recent Labs  Lab 07/31/17 1919 08/01/17 0500 08/02/17 0554 08/03/17 0550 08/06/17 0507  WBC 11.8* 8.8 8.9 8.1 9.2  NEUTROABS  --   --  5.8 4.9  --   HGB 11.4* 9.5* 9.6* 9.0* 10.4*  HCT 33.7* 28.9* 29.1* 27.6* 32.0*  MCV 88.7 89.8 89.8 90.5 89.1  PLT 299 320 294 270 749   Basic Metabolic Panel: Recent Labs  Lab 08/01/17 0500 08/02/17 0554 08/03/17 0550 08/04/17 0549 08/06/17 0507  NA 135 135 137 134* 136  K 5.0 4.2 4.1 3.6 3.5  CL 108 107 112* 104 103  CO2 20* 20* 20* 22 24  GLUCOSE 120* 128* 124* 186* 177*  BUN 24* 18 14 13 13   CREATININE 1.87* 1.49* 1.34* 1.26* 1.34*  CALCIUM 8.3* 8.3* 8.0* 8.2* 8.2*   GFR: Estimated Creatinine Clearance: 27.1 mL/min (A) (by C-G formula based on SCr of 1.34 mg/dL (H)). Liver Function Tests: Recent Labs  Lab 07/31/17 1919 08/02/17 0554 08/03/17 0550  AST 51* 44* 32  ALT 23 20 18   ALKPHOS 81 64 59  BILITOT 0.4 0.5 0.4  PROT 7.9 6.6 6.0*  ALBUMIN 3.3* 2.6* 2.4*   Recent Labs  Lab 07/31/17 1919  LIPASE 36   No results for input(s): AMMONIA in the last 168 hours. Coagulation Profile: No results for input(s): INR, PROTIME in the last 168 hours. Cardiac Enzymes: Recent Labs  Lab 07/31/17 1919 08/01/17 0500 08/02/17 0554 08/03/17 0550 08/04/17 0549  CKTOTAL 2,296* 1,794* 1,514* 775* 679*   BNP  (last 3 results) No results for input(s): PROBNP in the last 8760 hours. HbA1C: No results for input(s): HGBA1C in the last 72 hours. CBG: Recent Labs  Lab 08/05/17 1156 08/05/17 1651 08/05/17 2249 08/06/17 0727 08/06/17 1139  GLUCAP 173* 153* 177* 149* 126*   Lipid Profile: No results for input(s): CHOL, HDL, LDLCALC, TRIG, CHOLHDL, LDLDIRECT in the last 72 hours. Thyroid Function Tests: No results for input(s): TSH, T4TOTAL, FREET4, T3FREE, THYROIDAB in the last 72 hours. Anemia Panel: No results for input(s): VITAMINB12, FOLATE, FERRITIN, TIBC, IRON, RETICCTPCT in the last 72 hours. Sepsis Labs: No results for input(s): PROCALCITON, LATICACIDVEN in the last 168 hours.  No results found for this or any previous visit (from the past 240 hour(s)).     Radiology Studies: No  results found.    Scheduled Meds: . amLODipine  5 mg Oral Daily  . atorvastatin  40 mg Oral Daily  . cholecalciferol  1,000 Units Oral Daily  . folic acid  0.5 mg Oral Daily  . insulin aspart  0-5 Units Subcutaneous QHS  . insulin aspart  0-9 Units Subcutaneous TID WC  . levETIRAcetam  500 mg Oral BID  . metoprolol tartrate  50 mg Oral BID WC  . omega-3 acid ethyl esters  1 g Oral TID  . pantoprazole  40 mg Oral Daily  . pramipexole  0.5 mg Oral QHS  . vitamin E  400 Units Oral Daily   Continuous Infusions: . sodium chloride 50 mL/hr at 08/05/17 1826  . piperacillin-tazobactam (ZOSYN)  IV 3.375 g (08/06/17 1149)  . vancomycin Stopped (08/04/17 1832)     LOS: 5 days    Time spent: 30 minutes   Dessa Phi, DO [facility] [email or web address] Password [email or web address] 08/06/2017, 2:12 PM

## 2017-08-07 ENCOUNTER — Encounter (HOSPITAL_COMMUNITY): Payer: Self-pay

## 2017-08-07 ENCOUNTER — Inpatient Hospital Stay (HOSPITAL_COMMUNITY): Payer: Medicare HMO | Admitting: Anesthesiology

## 2017-08-07 ENCOUNTER — Encounter (HOSPITAL_COMMUNITY)
Admission: EM | Disposition: A | Discharge status: Skilled Nursing Facility | Payer: Self-pay | Source: Home / Self Care | Attending: Internal Medicine

## 2017-08-07 HISTORY — PX: AMPUTATION: SHX166

## 2017-08-07 LAB — GLUCOSE, CAPILLARY
GLUCOSE-CAPILLARY: 161 mg/dL — AB (ref 65–99)
GLUCOSE-CAPILLARY: 125 mg/dL — AB (ref 65–99)
GLUCOSE-CAPILLARY: 197 mg/dL — AB (ref 65–99)
Glucose-Capillary: 122 mg/dL — ABNORMAL HIGH (ref 65–99)

## 2017-08-07 LAB — BASIC METABOLIC PANEL
ANION GAP: 8 (ref 5–15)
BUN: 14 mg/dL (ref 6–20)
CALCIUM: 8.4 mg/dL — AB (ref 8.9–10.3)
CO2: 25 mmol/L (ref 22–32)
CREATININE: 1.29 mg/dL — AB (ref 0.44–1.00)
Chloride: 102 mmol/L (ref 101–111)
GFR, EST AFRICAN AMERICAN: 44 mL/min — AB (ref >60)
GFR, EST NON AFRICAN AMERICAN: 38 mL/min — AB (ref >60)
Glucose, Bld: 155 mg/dL — ABNORMAL HIGH (ref 65–99)
Potassium: 3.3 mmol/L — ABNORMAL LOW (ref 3.5–5.1)
SODIUM: 135 mmol/L (ref 135–145)

## 2017-08-07 LAB — CBC
HEMATOCRIT: 31.9 % — AB (ref 36.0–46.0)
Hemoglobin: 10.4 g/dL — ABNORMAL LOW (ref 12.0–15.0)
MCH: 28.8 pg (ref 26.0–34.0)
MCHC: 32.6 g/dL (ref 30.0–36.0)
MCV: 88.4 fL (ref 78.0–100.0)
PLATELETS: 331 10*3/uL (ref 150–400)
RBC: 3.61 MIL/uL — ABNORMAL LOW (ref 3.87–5.11)
RDW: 13.5 % (ref 11.5–15.5)
WBC: 10.3 10*3/uL (ref 4.0–10.5)

## 2017-08-07 SURGERY — AMPUTATION DIGIT
Anesthesia: Monitor Anesthesia Care | Site: Toe | Laterality: Right

## 2017-08-07 MED ORDER — LACTATED RINGERS IV SOLN
INTRAVENOUS | Status: DC | PRN
  Administered 2017-08-07: 14:00:00 via INTRAVENOUS

## 2017-08-07 MED ORDER — MAGNESIUM CITRATE PO SOLN: 1.0000 | Freq: Once | ORAL | Status: DC | PRN

## 2017-08-07 MED ORDER — SODIUM CHLORIDE 0.9 % IV SOLN
INTRAVENOUS | Status: DC
  Administered 2017-08-07: 23:00:00 via INTRAVENOUS

## 2017-08-07 MED ORDER — DOCUSATE SODIUM 100 MG PO CAPS
100.0000 mg | ORAL_CAPSULE | Freq: Two times a day (BID) | ORAL | Status: DC
  Administered 2017-08-07 – 2017-08-11 (×8): 100 mg via ORAL
  Filled 2017-08-07 (×8): qty 1

## 2017-08-07 MED ORDER — POLYETHYLENE GLYCOL 3350 17 G PO PACK: 17.0000 g | PACK | Freq: Every day | ORAL | Status: DC | PRN

## 2017-08-07 MED ORDER — ONDANSETRON HCL 4 MG PO TABS: 4.0000 mg | ORAL_TABLET | Freq: Four times a day (QID) | ORAL | Status: DC | PRN

## 2017-08-07 MED ORDER — CHLORHEXIDINE GLUCONATE 4 % EX LIQD
60.0000 mL | Freq: Once | CUTANEOUS | Status: DC
  Filled 2017-08-07: qty 60

## 2017-08-07 MED ORDER — BISACODYL 10 MG RE SUPP: 10.0000 mg | Freq: Every day | RECTAL | Status: DC | PRN

## 2017-08-07 MED ORDER — CEFAZOLIN SODIUM-DEXTROSE 2-4 GM/100ML-% IV SOLN
2.0000 g | INTRAVENOUS | Status: AC
  Administered 2017-08-07: 2 g via INTRAVENOUS
  Filled 2017-08-07: qty 100

## 2017-08-07 MED ORDER — PROPOFOL 500 MG/50ML IV EMUL
INTRAVENOUS | Status: DC | PRN
Start: 2017-08-07 — End: 2017-08-07
  Administered 2017-08-07: 25 ug/kg/min via INTRAVENOUS

## 2017-08-07 MED ORDER — ONDANSETRON HCL 4 MG/2ML IJ SOLN
INTRAMUSCULAR | Status: AC
  Filled 2017-08-07: qty 2

## 2017-08-07 MED ORDER — FENTANYL CITRATE (PF) 250 MCG/5ML IJ SOLN
INTRAMUSCULAR | Status: AC
  Filled 2017-08-07: qty 5

## 2017-08-07 MED ORDER — FENTANYL CITRATE (PF) 250 MCG/5ML IJ SOLN
INTRAMUSCULAR | Status: DC | PRN
  Administered 2017-08-07: 25 ug via INTRAVENOUS

## 2017-08-07 MED ORDER — ACETAMINOPHEN 325 MG PO TABS
650.0000 mg | ORAL_TABLET | ORAL | Status: DC | PRN
  Administered 2017-08-08: 650 mg via ORAL
  Filled 2017-08-07: qty 2

## 2017-08-07 MED ORDER — GLYCOPYRROLATE 0.2 MG/ML IJ SOLN
INTRAMUSCULAR | Status: DC | PRN
  Administered 2017-08-07: 0.2 mg via INTRAVENOUS

## 2017-08-07 MED ORDER — FENTANYL CITRATE (PF) 100 MCG/2ML IJ SOLN: 25.0000 ug | INTRAMUSCULAR | Status: DC | PRN

## 2017-08-07 MED ORDER — PROPOFOL 10 MG/ML IV BOLUS
INTRAVENOUS | Status: DC | PRN
  Administered 2017-08-07: 20 mg via INTRAVENOUS

## 2017-08-07 MED ORDER — METOCLOPRAMIDE HCL 5 MG/ML IJ SOLN: 5.0000 mg | Freq: Three times a day (TID) | INTRAMUSCULAR | Status: DC | PRN

## 2017-08-07 MED ORDER — PROPOFOL 10 MG/ML IV BOLUS
INTRAVENOUS | Status: AC
  Filled 2017-08-07: qty 20

## 2017-08-07 MED ORDER — METHOCARBAMOL 500 MG PO TABS: 500.0000 mg | ORAL_TABLET | Freq: Four times a day (QID) | ORAL | Status: DC | PRN

## 2017-08-07 MED ORDER — METHOCARBAMOL 1000 MG/10ML IJ SOLN: 500.0000 mg | Freq: Four times a day (QID) | INTRAVENOUS | Status: DC | PRN

## 2017-08-07 MED ORDER — LIDOCAINE HCL (CARDIAC) 20 MG/ML IV SOLN
INTRAVENOUS | Status: DC | PRN
  Administered 2017-08-07: 50 mg via INTRATRACHEAL

## 2017-08-07 MED ORDER — ACETAMINOPHEN 650 MG RE SUPP: 650.0000 mg | RECTAL | Status: DC | PRN

## 2017-08-07 MED ORDER — METOCLOPRAMIDE HCL 5 MG PO TABS: 5.0000 mg | ORAL_TABLET | Freq: Three times a day (TID) | ORAL | Status: DC | PRN

## 2017-08-07 MED ORDER — LIDOCAINE 2% (20 MG/ML) 5 ML SYRINGE
INTRAMUSCULAR | Status: AC
  Filled 2017-08-07: qty 5

## 2017-08-07 MED ORDER — ONDANSETRON HCL 4 MG/2ML IJ SOLN
INTRAMUSCULAR | Status: DC | PRN
  Administered 2017-08-07: 4 mg via INTRAVENOUS

## 2017-08-07 MED ORDER — 0.9 % SODIUM CHLORIDE (POUR BTL) OPTIME
TOPICAL | Status: DC | PRN
  Administered 2017-08-07: 1000 mL

## 2017-08-07 MED ORDER — ONDANSETRON HCL 4 MG/2ML IJ SOLN: 4.0000 mg | Freq: Four times a day (QID) | INTRAMUSCULAR | Status: DC | PRN

## 2017-08-07 SURGICAL SUPPLY — 36 items
BLADE SURG 21 STRL SS (BLADE) ×3 IMPLANT
BNDG COHESIVE 4X5 TAN STRL (GAUZE/BANDAGES/DRESSINGS) ×3 IMPLANT
BNDG ESMARK 4X9 LF (GAUZE/BANDAGES/DRESSINGS) IMPLANT
BNDG GAUZE ELAST 4 BULKY (GAUZE/BANDAGES/DRESSINGS) ×3 IMPLANT
COVER SURGICAL LIGHT HANDLE (MISCELLANEOUS) ×6 IMPLANT
DRAPE U-SHAPE 47X51 STRL (DRAPES) ×3 IMPLANT
DRSG ADAPTIC 3X8 NADH LF (GAUZE/BANDAGES/DRESSINGS) ×3 IMPLANT
DRSG PAD ABDOMINAL 8X10 ST (GAUZE/BANDAGES/DRESSINGS) ×3 IMPLANT
DURAPREP 26ML APPLICATOR (WOUND CARE) ×3 IMPLANT
ELECT REM PT RETURN 9FT ADLT (ELECTROSURGICAL) ×3
ELECTRODE REM PT RTRN 9FT ADLT (ELECTROSURGICAL) ×1 IMPLANT
GAUZE SPONGE 4X4 12PLY STRL (GAUZE/BANDAGES/DRESSINGS) IMPLANT
GAUZE SPONGE 4X4 12PLY STRL LF (GAUZE/BANDAGES/DRESSINGS) ×3 IMPLANT
GLOVE BIOGEL PI IND STRL 6.5 (GLOVE) ×1 IMPLANT
GLOVE BIOGEL PI IND STRL 7.5 (GLOVE) ×1 IMPLANT
GLOVE BIOGEL PI IND STRL 9 (GLOVE) ×1 IMPLANT
GLOVE BIOGEL PI INDICATOR 6.5 (GLOVE) ×2
GLOVE BIOGEL PI INDICATOR 7.5 (GLOVE) ×2
GLOVE BIOGEL PI INDICATOR 9 (GLOVE) ×2
GLOVE SURG ORTHO 9.0 STRL STRW (GLOVE) ×3 IMPLANT
GLOVE SURG SS PI 6.5 STRL IVOR (GLOVE) ×3 IMPLANT
GOWN STRL REUS W/ TWL LRG LVL3 (GOWN DISPOSABLE) ×2 IMPLANT
GOWN STRL REUS W/ TWL XL LVL3 (GOWN DISPOSABLE) ×2 IMPLANT
GOWN STRL REUS W/TWL LRG LVL3 (GOWN DISPOSABLE) ×4
GOWN STRL REUS W/TWL XL LVL3 (GOWN DISPOSABLE) ×4
KIT BASIN OR (CUSTOM PROCEDURE TRAY) ×3 IMPLANT
KIT ROOM TURNOVER OR (KITS) ×3 IMPLANT
MANIFOLD NEPTUNE II (INSTRUMENTS) ×3 IMPLANT
NEEDLE 22X1 1/2 (OR ONLY) (NEEDLE) IMPLANT
NS IRRIG 1000ML POUR BTL (IV SOLUTION) ×3 IMPLANT
PACK ORTHO EXTREMITY (CUSTOM PROCEDURE TRAY) ×3 IMPLANT
PAD ABD 8X10 STRL (GAUZE/BANDAGES/DRESSINGS) ×3 IMPLANT
PAD ARMBOARD 7.5X6 YLW CONV (MISCELLANEOUS) ×6 IMPLANT
SUT ETHILON 2 0 PSLX (SUTURE) ×3 IMPLANT
SYR CONTROL 10ML LL (SYRINGE) IMPLANT
TOWEL OR 17X26 10 PK STRL BLUE (TOWEL DISPOSABLE) ×3 IMPLANT

## 2017-08-07 NOTE — Progress Notes (Signed)
PROGRESS NOTE    Sara Griffith  QIO:[id number] DOB: 1935-10-10 DOA: 07/31/2017 PCP: Kathyrn Lass, MD     Brief Narrative:  Sara Griffith is a 81 y.o. female with medical history significant of hypertension, hyperlipidemia, diabetes mellitus, TIA, GERD, RLS, PVD, CKD-III, recent fall which caused small ICH, who presents with fall and left groin pain. Pt states that she slid off the couch when she was trying to go to the bathroom around 7 AM. She could not get up until she was found by the home health nurse who visited her this afternoon. She states that she has pain in the left groin area which is constant, sharp, 5 out of 10 in severity, nonradiating. She was admitted for treatment of rhabdomyolysis, AKI, as well as diabetic foot ulcers.   Of note, patient fell November 23 while visiting family and was admitted to the hospital in [place] where she had a scalp laceration and small ICH. She was admitted to the ICU and discharged back to her facility.  Assessment & Plan:   Principal Problem:   Fall Active Problems:   RLS (restless legs syndrome)   GERD (gastroesophageal reflux disease)   HLD (hyperlipidemia)   HTN (hypertension)   Type II diabetes mellitus with renal manifestations (HCC)   Left groin pain   Rhabdomyolysis   History of intracranial hemorrhage   Acute renal failure superimposed on stage 3 chronic kidney disease (HCC)   Ulcer of toe of right foot (HCC)   Chronic osteomyelitis of toe, right (HCC)   Rhabdomyolysis after mechanical fall  -IVF, improving CK level   AKI on CKD stage III -Baseline Cr is1.2 -Likely due to prerenal secondary to rhabdo, dehydration, continuation of ARB -Holdcozaar -Cr stable now   Bilateral diabetic foot ulcers -Left foot MRI negative for osteomyelitis  -Right great toe +osteomyelitis of first distal phalanx  -ABI unremarkable -Right great toe amputation today with Dr. Sharol Given  -Continue vanco/zosyn   Noninsulin dependent Type II  diabetes mellitus with renal manifestations -Ha1c 7.4. Patient is takingTradjentaat home -SSI  Recent fall with small ICH -CT head showed stable slightly decreased extra-axial hemorrhage along the interhemispheric fissure as compared to previous -Continue keppra   HTN -Continue norvasc, lopressor   HLD -Continue lipitor    DVT prophylaxis: SCD  Code Status: DNR Family Communication: No family at bedside Disposition Plan: OR today    Consultants:   Orthopedic surgery   Procedures:   None   Antimicrobials:  Anti-infectives (From admission, onward)   Start     Dose/Rate Route Frequency Ordered Stop   08/07/17 0800  ceFAZolin (ANCEF) IVPB 2g/100 mL premix     2 g 200 mL/hr over 30 Minutes Intravenous To ShortStay Surgical 08/07/17 0129 08/08/17 0800   08/04/17 1800  vancomycin (VANCOCIN) IVPB 750 mg/150 ml premix     750 mg 150 mL/hr over 60 Minutes Intravenous Every 48 hours 08/02/17 1717     08/02/17 1800  vancomycin (VANCOCIN) 1,250 mg in sodium chloride 0.9 % 250 mL IVPB     1,250 mg 166.7 mL/hr over 90 Minutes Intravenous  Once 08/02/17 1717 08/02/17 1949   08/02/17 1800  piperacillin-tazobactam (ZOSYN) IVPB 3.375 g     3.375 g 12.5 mL/hr over 240 Minutes Intravenous Every 8 hours 08/02/17 1717         Subjective: Doing well this morning.  No acute events, no new complaints. Waiting for surgery. Denies CP SOB N/V  Objective: Vitals:   08/06/17 1732 08/06/17 2103 08/07/17  0411 08/07/17 1000  BP: (!) 134/55 (!) 151/70 (!) 152/67 (!) 156/62  Pulse: 68 78 80 76  Resp: 16 16 17 18   Temp: 98 F (36.7 C) 97.9 F (36.6 C) 98.5 F (36.9 C) 98 F (36.7 C)  TempSrc: Oral Oral Oral Oral  SpO2: 96%  95% 98%  Weight:  60.1 kg (132 lb 7.9 oz)    Height:        Intake/Output Summary (Last 24 hours) at 08/07/2017 1257 Last data filed at 08/07/2017 1025 Gross per 24 hour  Intake 780 ml  Output 650 ml  Net 130 ml   Filed Weights   07/31/17 1718 08/06/17 0439  08/06/17 2103  Weight: 61.2 kg (135 lb) 61.5 kg (135 lb 9.3 oz) 60.1 kg (132 lb 7.9 oz)    Examination:  General exam: Appears calm and comfortable  Respiratory system: Clear to auscultation. Respiratory effort normal. Cardiovascular system: S1 & S2 heard, RRR. No JVD, murmurs, rubs, gallops or clicks. No pedal edema. Gastrointestinal system: Abdomen is nondistended, soft and nontender. No organomegaly or masses felt. Normal bowel sounds heard. Central nervous system: Alert and oriented. No focal neurological deficits. Extremities: +left great toe amputated  Skin: +left first metatarsal head ulcer with clean base, +right great toe ulcer on plantar surface  Psychiatry: Judgement and insight appear normal. Mood & affect appropriate.   Data Reviewed: I have personally reviewed following labs and imaging studies  CBC: Recent Labs  Lab 08/01/17 0500 08/02/17 0554 08/03/17 0550 08/06/17 0507 08/07/17 0339  WBC 8.8 8.9 8.1 9.2 10.3  NEUTROABS  --  5.8 4.9  --   --   HGB 9.5* 9.6* 9.0* 10.4* 10.4*  HCT 28.9* 29.1* 27.6* 32.0* 31.9*  MCV 89.8 89.8 90.5 89.1 88.4  PLT 320 294 270 311 981   Basic Metabolic Panel: Recent Labs  Lab 08/02/17 0554 08/03/17 0550 08/04/17 0549 08/06/17 0507 08/07/17 0339  NA 135 137 134* 136 135  K 4.2 4.1 3.6 3.5 3.3*  CL 107 112* 104 103 102  CO2 20* 20* 22 24 25   GLUCOSE 128* 124* 186* 177* 155*  BUN 18 14 13 13 14   CREATININE 1.49* 1.34* 1.26* 1.34* 1.29*  CALCIUM 8.3* 8.0* 8.2* 8.2* 8.4*   GFR: Estimated Creatinine Clearance: 27.9 mL/min (A) (by C-G formula based on SCr of 1.29 mg/dL (H)). Liver Function Tests: Recent Labs  Lab 07/31/17 1919 08/02/17 0554 08/03/17 0550  AST 51* 44* 32  ALT 23 20 18   ALKPHOS 81 64 59  BILITOT 0.4 0.5 0.4  PROT 7.9 6.6 6.0*  ALBUMIN 3.3* 2.6* 2.4*   Recent Labs  Lab 07/31/17 1919  LIPASE 36   No results for input(s): AMMONIA in the last 168 hours. Coagulation Profile: No results for input(s):  INR, PROTIME in the last 168 hours. Cardiac Enzymes: Recent Labs  Lab 07/31/17 1919 08/01/17 0500 08/02/17 0554 08/03/17 0550 08/04/17 0549  CKTOTAL 2,296* 1,794* 1,514* 775* 679*   BNP (last 3 results) No results for input(s): PROBNP in the last 8760 hours. HbA1C: No results for input(s): HGBA1C in the last 72 hours. CBG: Recent Labs  Lab 08/06/17 1139 08/06/17 1657 08/06/17 2103 08/07/17 0733 08/07/17 1138  GLUCAP 126* 265* 161* 161* 122*   Lipid Profile: No results for input(s): CHOL, HDL, LDLCALC, TRIG, CHOLHDL, LDLDIRECT in the last 72 hours. Thyroid Function Tests: No results for input(s): TSH, T4TOTAL, FREET4, T3FREE, THYROIDAB in the last 72 hours. Anemia Panel: No results for input(s): VITAMINB12, FOLATE,  FERRITIN, TIBC, IRON, RETICCTPCT in the last 72 hours. Sepsis Labs: No results for input(s): PROCALCITON, LATICACIDVEN in the last 168 hours.  Recent Results (from the past 240 hour(s))  Surgical pcr screen     Status: None   Collection Time: 08/06/17  9:37 PM  Result Value Ref Range Status   MRSA, PCR NEGATIVE NEGATIVE Final   Staphylococcus aureus NEGATIVE NEGATIVE Final    Comment: (NOTE) The Xpert SA Assay (FDA approved for NASAL specimens in patients 80 years of age and older), is one component of a comprehensive surveillance program. It is not intended to diagnose infection nor to guide or monitor treatment.        Radiology Studies: No results found.    Scheduled Meds: . amLODipine  5 mg Oral Daily  . atorvastatin  40 mg Oral Daily  . chlorhexidine  60 mL Topical Once  . cholecalciferol  1,000 Units Oral Daily  . folic acid  0.5 mg Oral Daily  . insulin aspart  0-5 Units Subcutaneous QHS  . insulin aspart  0-9 Units Subcutaneous TID WC  . levETIRAcetam  500 mg Oral BID  . metoprolol tartrate  50 mg Oral BID WC  . omega-3 acid ethyl esters  1 g Oral TID  . pantoprazole  40 mg Oral Daily  . pramipexole  0.5 mg Oral QHS  . vitamin E   400 Units Oral Daily   Continuous Infusions: . sodium chloride 50 mL/hr at 08/05/17 1826  .  ceFAZolin (ANCEF) IV    . piperacillin-tazobactam (ZOSYN)  IV 3.375 g (08/07/17 0842)  . vancomycin Stopped (08/06/17 1933)     LOS: 6 days    Time spent: 30 minutes   Dessa Phi, DO [facility] [email or web address] Password [facility] 2 08/07/2017, 12:57 PM

## 2017-08-07 NOTE — Transfer of Care (Signed)
Immediate Anesthesia Transfer of Care Note  Patient: Sara Griffith  Procedure(s) Performed: RIGHT GREAT TOE AMPUTATION AT METATARSOPHALANGEAL JOINT (Right Toe)  Patient Location: PACU  Anesthesia Type:MAC  Level of Consciousness: awake, alert  and patient cooperative  Airway & Oxygen Therapy: Patient Spontanous Breathing and Patient connected to nasal cannula oxygen  Post-op Assessment: Report given to RN, Post -op Vital signs reviewed and stable, Patient moving all extremities X 4 and Patient able to stick tongue midline  Post vital signs: Reviewed and stable  Last Vitals:  Vitals:   08/07/17 0411 08/07/17 1000  BP: (!) 152/67 (!) 156/62  Pulse: 80 76  Resp: 17 18  Temp: 36.9 C 36.7 C  SpO2: 95% 98%    Last Pain:  Vitals:   08/07/17 1000  TempSrc: Oral  PainSc:          Complications: No apparent anesthesia complications

## 2017-08-07 NOTE — Care Management Note (Signed)
Case Management Note  Patient Details  Name: Sara Griffith MRN: [id number] Date of Birth: Feb 19, 1936  Subjective/Objective:       CM following for progression and d/c planning.              Action/Plan: 08/07/2017 Noted CM referral for d/c needs, however pt surgery today 08/07/17, unclear if she will be able to d/c to home with [facility] or will require short term reha until pt has PT eval.  CM will follow for needs and orders.   Expected Discharge Date:                  Expected Discharge Plan:     In-House Referral:     Discharge planning Services  CM Consult  Post Acute Care Choice:    Choice offered to:     DME Arranged:    DME Agency:     HH Arranged:    HH Agency:     Status of Service:  In process, will continue to follow  If discussed at Long Length of Stay Meetings, dates discussed:    Additional Comments:  Adron Bene, RN 08/07/2017, 4:41 PM

## 2017-08-07 NOTE — Consult Note (Signed)
            [facility] CM Primary Care Navigator  08/07/2017  Dameisha Wanat [birth year] [id number]   Martin Majestic to see patient in the room earlier to identify possible discharge needs but staff reports that patient is off the unit for surgery. (RIGHT GREAT TOE AMPUTATION AT METATARSOPHALANGEAL JOINT)  Will attempt to see patient at another time when available in the room.   Per chart review, patient desires to go home with home health services versus SNF (skilled nursing facility). Patient resides at [facility] and indicated that she does not want to go to SNF (skilled nursing facility) and would much rather return back to independent living and have home health services there per Inpatient social worker note. Will await for therapy recommendations and physician order for discharge.    Addendum (08/11/17):  Patient was discharged today to skilled nursing facility [facility]) per therapy recommendation, prior to returning to her apartment in [place] facility.  Primary care provider's office is listed as providing transition of care (TOC).    For additional questions please contact:  Edwena Felty A. Tavarius Grewe, BSN, RN-BC [facility] PRIMARY CARE Navigator Cell: [phone]

## 2017-08-07 NOTE — Op Note (Signed)
08/07/2017  2:40 PM  PATIENT:  Sara Griffith    PRE-OPERATIVE DIAGNOSIS:  Osteomyelitis Right Great Toe  POST-OPERATIVE DIAGNOSIS:  Same  PROCEDURE:  RIGHT GREAT TOE AMPUTATION AT METATARSOPHALANGEAL JOINT  SURGEON:  Newt Minion, MD  PHYSICIAN ASSISTANT:None ANESTHESIA:   General  PREOPERATIVE INDICATIONS:  Sara Griffith is a  81 y.o. female with a diagnosis of Osteomyelitis Right Great Toe who failed conservative measures and elected for surgical management.    The risks benefits and alternatives were discussed with the patient preoperatively including but not limited to the risks of infection, bleeding, nerve injury, cardiopulmonary complications, the need for revision surgery, among others, and the patient was willing to proceed.  OPERATIVE IMPLANTS: none  OPERATIVE FINDINGS: Minimal petechial bleeding  OPERATIVE PROCEDURE: Patient was brought to the operating room after undergoing a regional anesthesia.  After adequate levels of anesthesia were obtained patient's right lower extremity was prepped using DuraPrep draped as a sterile field a timeout was called.  A fishmouth incision was made just distal to the MTP joint.  The toe was amputated through the MTP joint.  There is minimal petechial bleeding there is no abscess no drainage no signs of infection.  The wound was irrigated with normal saline.  The incision was closed using 2-0 nylon.  A sterile dressing was applied patient was taken to the PACU in stable condition.   DISCHARGE PLANNING:  Antibiotic duration: Complete IV antibiotics for 24 hours postoperatively.  Weightbearing: Touchdown weightbearing on the right.  Pain medication: As needed.  Dressing care/ Wound VAC: Keep operative dressing clean and dry change at the office follow-up in 1 week.  Ambulatory devices: Walker.  Discharge to: Home or skilled nursing placement as per physical therapy recommendations.  Follow-up: In the office 1 week post  operative.

## 2017-08-07 NOTE — Interval H&P Note (Signed)
History and Physical Interval Note:  08/07/2017 1:29 PM  Sara Griffith  has presented today for surgery, with the diagnosis of Osteomyelitis Right Great Toe  The various methods of treatment have been discussed with the patient and family. After consideration of risks, benefits and other options for treatment, the patient has consented to  Procedure(s): RIGHT GREAT TOE AMPUTATION AT METATARSOPHALANGEAL JOINT (Right) as a surgical intervention .  The patient's history has been reviewed, patient examined, no change in status, stable for surgery.  I have reviewed the patient's chart and labs.  Questions were answered to the patient's satisfaction.     Newt Minion

## 2017-08-07 NOTE — Anesthesia Preprocedure Evaluation (Addendum)
Anesthesia Evaluation  Patient identified by MRN, date of birth, ID band Patient awake    Reviewed: Allergy & Precautions, NPO status , Patient's Chart, lab work & pertinent test results  History of Anesthesia Complications Negative for: history of anesthetic complications  Airway Mallampati: I  TM Distance: >3 FB Neck ROM: Full    Dental  (+) Edentulous Upper, Edentulous Lower   Pulmonary neg pulmonary ROS, former smoker,    breath sounds clear to auscultation       Cardiovascular hypertension, + Peripheral Vascular Disease   Rhythm:Regular Rate:Normal     Neuro/Psych Hx of head injury , started on Keppra TIA Neuromuscular disease    GI/Hepatic GERD  ,  Endo/Other  diabetes, Poorly Controlled  Renal/GU Renal InsufficiencyRenal disease     Musculoskeletal   Abdominal   Peds  Hematology  (+) anemia ,   Anesthesia Other Findings   Reproductive/Obstetrics                           Anesthesia Physical Anesthesia Plan  ASA: III  Anesthesia Plan: MAC   Post-op Pain Management:    Induction: Intravenous  PONV Risk Score and Plan: 3 and Treatment may vary due to age or medical condition  Airway Management Planned: Natural Airway and Simple Face Mask  Additional Equipment:   Intra-op Plan:   Post-operative Plan:   Informed Consent: I have reviewed the patients History and Physical, chart, labs and discussed the procedure including the risks, benefits and alternatives for the proposed anesthesia with the patient or authorized representative who has indicated his/her understanding and acceptance.     Plan Discussed with: CRNA  Anesthesia Plan Comments:        Anesthesia Quick Evaluation

## 2017-08-07 NOTE — Progress Notes (Signed)
OT Cancellation Note  Patient Details Name: Ethel Meisenheimer MRN: [id number] DOB: [birth year]   Cancelled Treatment:    Reason Eval/Treat Not Completed: Patient at procedure or test/ unavailable.  Binnie Kand M.S., OTR/L  08/07/2017, 1:32 PM

## 2017-08-07 NOTE — Plan of Care (Signed)
  Education: Knowledge of General Education information will improve 08/07/2017 0403 - Progressing by Anson Fret, RN Note POC reviewed with pt.

## 2017-08-08 ENCOUNTER — Other Ambulatory Visit: Payer: Self-pay

## 2017-08-08 LAB — BASIC METABOLIC PANEL
Anion gap: 13 (ref 5–15)
BUN: 12 mg/dL (ref 6–20)
CALCIUM: 8.7 mg/dL — AB (ref 8.9–10.3)
CO2: 24 mmol/L (ref 22–32)
CREATININE: 1.48 mg/dL — AB (ref 0.44–1.00)
Chloride: 100 mmol/L — ABNORMAL LOW (ref 101–111)
GFR calc non Af Amer: 32 mL/min — ABNORMAL LOW (ref >60)
GFR, EST AFRICAN AMERICAN: 38 mL/min — AB (ref >60)
Glucose, Bld: 192 mg/dL — ABNORMAL HIGH (ref 65–99)
Potassium: 3 mmol/L — ABNORMAL LOW (ref 3.5–5.1)
SODIUM: 137 mmol/L (ref 135–145)

## 2017-08-08 LAB — CBC
HCT: 31.8 % — ABNORMAL LOW (ref 36.0–46.0)
Hemoglobin: 10.5 g/dL — ABNORMAL LOW (ref 12.0–15.0)
MCH: 29.2 pg (ref 26.0–34.0)
MCHC: 33 g/dL (ref 30.0–36.0)
MCV: 88.6 fL (ref 78.0–100.0)
Platelets: 329 10*3/uL (ref 150–400)
RBC: 3.59 MIL/uL — ABNORMAL LOW (ref 3.87–5.11)
RDW: 13.6 % (ref 11.5–15.5)
WBC: 11.1 10*3/uL — ABNORMAL HIGH (ref 4.0–10.5)

## 2017-08-08 LAB — GLUCOSE, CAPILLARY
GLUCOSE-CAPILLARY: 174 mg/dL — AB (ref 65–99)
GLUCOSE-CAPILLARY: 155 mg/dL — AB (ref 65–99)
Glucose-Capillary: 261 mg/dL — ABNORMAL HIGH (ref 65–99)
Glucose-Capillary: 152 mg/dL — ABNORMAL HIGH (ref 65–99)

## 2017-08-08 LAB — MAGNESIUM: MAGNESIUM: 1.2 mg/dL — AB (ref 1.7–2.4)

## 2017-08-08 MED ORDER — MAGNESIUM SULFATE 2 GM/50ML IV SOLN
2.0000 g | Freq: Once | INTRAVENOUS | Status: AC
  Administered 2017-08-08: 2 g via INTRAVENOUS
  Filled 2017-08-08: qty 50

## 2017-08-08 MED ORDER — POTASSIUM CHLORIDE CRYS ER 20 MEQ PO TBCR
40.0000 meq | EXTENDED_RELEASE_TABLET | ORAL | Status: AC
  Administered 2017-08-08 (×2): 40 meq via ORAL
  Filled 2017-08-08 (×2): qty 2

## 2017-08-08 NOTE — Progress Notes (Signed)
     Subjective: 1 Day Post-Op Procedure(s) (LRB): RIGHT GREAT TOE AMPUTATION AT METATARSOPHALANGEAL JOINT (Right) Awake, alert and oriented x 4. States she would probably do better with her own walker, has a difficult time pushing the larger Walker with wheels. Told to see if daughter would bring in for use with PT.  Patient reports pain as mild.    Objective:   VITALS:  Temp:  [98.2 F (36.8 C)-99.4 F (37.4 C)] 98.6 F (37 C) (12/08 0954) Pulse Rate:  [75-80] 80 (12/08 0954) Resp:  [17-18] 18 (12/08 0954) BP: (147-150)/(55-73) 147/73 (12/08 0954) SpO2:  [99 %-100 %] 100 % (12/08 0954) Weight:  [132 lb 4.4 oz (60 kg)] 132 lb 4.4 oz (60 kg) (12/07 2028)  Neurologically intact ABD soft Dorsiflexion/Plantar flexion intact Incision: dressing C/D/I   LABS Recent Labs    08/06/17 0507 08/07/17 0339 08/08/17 0420  HGB 10.4* 10.4* 10.5*  WBC 9.2 10.3 11.1*  PLT 311 331 329   Recent Labs    08/07/17 0339 08/08/17 0420  NA 135 137  K 3.3* 3.0*  CL 102 100*  CO2 25 24  BUN 14 12  CREATININE 1.29* 1.48*  GLUCOSE 155* 192*   No results for input(s): LABPT, INR in the last 72 hours.   Assessment/Plan: 1 Day Post-Op Procedure(s) (LRB): RIGHT GREAT TOE AMPUTATION AT METATARSOPHALANGEAL JOINT (Right)  Advance diet Up with therapy D/C IV fluids  Basil Dess 08/08/2017, 4:58 PMPatient ID: Sara Griffith, female   DOB: 12/01/1935, 81 y.o.   MRN: [id number]

## 2017-08-08 NOTE — Progress Notes (Signed)
Physical Therapy Treatment Patient Details Name: Sara Griffith MRN: [id number] DOB: [birth year] Today's Date: 08/08/2017    History of Present Illness  Sara Griffith is a 81 y.o. female with medical history significant of hypertension, hyperlipidemia, diabetes mellitus, TIA, GERD, RLS, PVD, CKD-III, recent fall which caused small ICH, who presents with fall and left groin pain. Pt is now s/p R great toe amputation with TDWB R LE orders.    PT Comments    Pt seen for Re-evaluation following R great toe amputation on 12/7. Pt with greater deficits following surgery as she is now TDWB R LE and requiring min-mod A for transfers. Pt was unable to clear L foot to advance it forward for ambulation while maintaining TDWB R LE. Pt would greatly benefit from further intensive therapy services in the SNF setting prior to returning to her apartment in [place]. Pt would continue to benefit from skilled physical therapy services at this time while admitted and after d/c to address the below listed limitations in order to improve overall safety and independence with functional mobility.   Follow Up Recommendations  SNF;Supervision/Assistance - 24 hour     Equipment Recommendations  None recommended by PT    Recommendations for Other Services       Precautions / Restrictions Precautions Precautions: Fall Restrictions Weight Bearing Restrictions: Yes RLE Weight Bearing: Touchdown weight bearing    Mobility  Bed Mobility               General bed mobility comments: pt OOB in recliner upon arrival  Transfers Overall transfer level: Needs assistance Equipment used: Rolling walker (2 wheeled) Transfers: Sit to/from Stand Sit to Stand: Mod assist;Min assist         General transfer comment: increased time and effort, cueing for technique and safe hand placement; pt required mod A for first transfer attempt as she lost her balance posteriorly. Pt performed sit<>stand from recliner  chair x4 progressing to min A  Ambulation/Gait             General Gait Details: attempted to ambulate, however, pt unable to advance L LE forwards while maintaining TDWB on R LE. pt unable to clear L foot to advance   Stairs            Wheelchair Mobility    Modified Rankin (Stroke Patients Only)       Balance Overall balance assessment: Needs assistance Sitting-balance support: Feet supported Sitting balance-Leahy Scale: Fair   Postural control: Posterior lean Standing balance support: During functional activity;Bilateral upper extremity supported Standing balance-Leahy Scale: Poor Standing balance comment: reliant on bilateral UEs on RW and min A                            Cognition Arousal/Alertness: Awake/alert Behavior During Therapy: WFL for tasks assessed/performed Overall Cognitive Status: No family/caregiver present to determine baseline cognitive functioning Area of Impairment: Safety/judgement;Problem solving                         Safety/Judgement: Decreased awareness of deficits;Decreased awareness of safety   Problem Solving: Difficulty sequencing;Requires verbal cues;Requires tactile cues        Exercises      General Comments        Pertinent Vitals/Pain Pain Assessment: No/denies pain    Home Living  Prior Function            PT Goals (current goals can now be found in the care plan section) Acute Rehab PT Goals PT Goal Formulation: With patient Time For Goal Achievement: 08/15/17 Potential to Achieve Goals: Good Progress towards PT goals: Progressing toward goals    Frequency    Min 3X/week      PT Plan Current plan remains appropriate    Co-evaluation              AM-PAC PT "6 Clicks" Daily Activity  Outcome Measure  Difficulty turning over in bed (including adjusting bedclothes, sheets and blankets)?: A Lot Difficulty moving from lying on back to  sitting on the side of the bed? : Unable Difficulty sitting down on and standing up from a chair with arms (e.g., wheelchair, bedside commode, etc,.)?: Unable Help needed moving to and from a bed to chair (including a wheelchair)?: A Lot Help needed walking in hospital room?: Total Help needed climbing 3-5 steps with a railing? : Total 6 Click Score: 8    End of Session Equipment Utilized During Treatment: Gait belt Activity Tolerance: Patient limited by fatigue Patient left: in chair;with call bell/phone within reach Nurse Communication: Mobility status PT Visit Diagnosis: Other abnormalities of gait and mobility (R26.89);Muscle weakness (generalized) (M62.81)     Time: 1013-1030 PT Time Calculation (min) (ACUTE ONLY): 17 min  Charges:  $Therapeutic Activity: 8-22 mins                    G Codes:       [place], [place], [place] 08/08/2017, 10:37 AM

## 2017-08-08 NOTE — Plan of Care (Signed)
  Clinical Measurements: Ability to maintain clinical measurements within normal limits will improve 08/08/2017 2035 - Progressing by Irish Lack, RN   Activity: Risk for activity intolerance will decrease 08/08/2017 2035 - Progressing by Irish Lack, RN   Pain Managment: General experience of comfort will improve 08/08/2017 2035 - Progressing by Irish Lack, RN

## 2017-08-08 NOTE — Progress Notes (Signed)
CSW met patient and daughters mother in law at bedside. CSW also had daughter on the phone to try and speak with patients family. Patient stated she is from [place] an independent living facility in [place]. Patient stated she does not want to go to a SNF and would rather discharge home with home health. Patient stated she has home health at her facility as well as people that will be able to make her 3 meals a day. CSW spoke with family and family does not agree with patients decision. CSW spoke with family in regards to having someone check in on patient. Patient stated she has a friend that she eats with that is willing to check in on patient. Patient does not want SNF   Rhea Pink, MSW,  [place] [phone]

## 2017-08-08 NOTE — Progress Notes (Signed)
PROGRESS NOTE    Sara Griffith  XIP:[id number] DOB: 10/08/1935 DOA: 07/31/2017 PCP: Kathyrn Lass, MD     Brief Narrative:  Sara Griffith is a 81 y.o. female with medical history significant of hypertension, hyperlipidemia, diabetes mellitus, TIA, GERD, RLS, PVD, CKD-III, recent fall which caused small ICH, who presents with fall and left groin pain. Pt states that she slid off the couch when she was trying to go to the bathroom around 7 AM. She could not get up until she was found by the home health nurse who visited her this afternoon. She states that she has pain in the left groin area which is constant, sharp, 5 out of 10 in severity, nonradiating. She was admitted for treatment of rhabdomyolysis, AKI, as well as diabetic foot ulcers.   Of note, patient fell November 23 while visiting family and was admitted to the hospital in [place] where she had a scalp laceration and small ICH. She was admitted to the ICU and discharged back to her facility.  Assessment & Plan:   Principal Problem:   Fall Active Problems:   RLS (restless legs syndrome)   GERD (gastroesophageal reflux disease)   HLD (hyperlipidemia)   HTN (hypertension)   Type II diabetes mellitus with renal manifestations (HCC)   Left groin pain   Rhabdomyolysis   History of intracranial hemorrhage   Acute renal failure superimposed on stage 3 chronic kidney disease (HCC)   Ulcer of toe of right foot (HCC)   Chronic osteomyelitis of toe, right (HCC)   Rhabdomyolysis after mechanical fall  -Resolved   AKI on CKD stage III -Baseline Cr is1.2 -Likely due to prerenal secondary to rhabdo, dehydration, continuation of ARB -Holdcozaar -Cr stable now, monitor   Bilateral diabetic foot ulcers -Left foot MRI negative for osteomyelitis  -Right great toe +osteomyelitis of first distal phalanx  -ABI unremarkable -S/p right great toe amputation 12/7 with Dr. Sharol Given  -Continue vanco/zosyn 24 hours post-operatively and stop    Noninsulin dependent Type II diabetes mellitus with renal manifestations -Ha1c 7.4. Patient is takingTradjentaat home -SSI  Recent fall with small ICH -CT head showed stable slightly decreased extra-axial hemorrhage along the interhemispheric fissure as compared to previous -Continue keppra   HTN -Continue norvasc, lopressor   HLD -Continue lipitor    DVT prophylaxis: SCD  Code Status: DNR Family Communication: Daughter over the phone Disposition Plan: SNF    Consultants:   Orthopedic surgery   Procedures:   None   Antimicrobials:  Anti-infectives (From admission, onward)   Start     Dose/Rate Route Frequency Ordered Stop   08/07/17 0800  ceFAZolin (ANCEF) IVPB 2g/100 mL premix     2 g 200 mL/hr over 30 Minutes Intravenous To ShortStay Surgical 08/07/17 0129 08/07/17 1403   08/04/17 1800  vancomycin (VANCOCIN) IVPB 750 mg/150 ml premix     750 mg 150 mL/hr over 60 Minutes Intravenous Every 48 hours 08/02/17 1717     08/02/17 1800  vancomycin (VANCOCIN) 1,250 mg in sodium chloride 0.9 % 250 mL IVPB     1,250 mg 166.7 mL/hr over 90 Minutes Intravenous  Once 08/02/17 1717 08/02/17 1949   08/02/17 1800  piperacillin-tazobactam (ZOSYN) IVPB 3.375 g     3.375 g 12.5 mL/hr over 240 Minutes Intravenous Every 8 hours 08/02/17 1717         Subjective: Doing well this morning. No surgical complication. No pain. Worked with PT and recommending SNF which she is hesitant about.   Objective: Vitals:  08/07/17 1503 08/07/17 2028 08/08/17 0614 08/08/17 0954  BP: (!) 172/74 (!) 147/69 (!) 150/55 (!) 147/73  Pulse: 78 80 75 80  Resp:  17 18 18   Temp: 98 F (36.7 C) 99.4 F (37.4 C) 98.2 F (36.8 C) 98.6 F (37 C)  TempSrc: Oral Oral Oral Oral  SpO2: 98% 99% 100% 100%  Weight:  60 kg (132 lb 4.4 oz)    Height:        Intake/Output Summary (Last 24 hours) at 08/08/2017 1338 Last data filed at 08/08/2017 0900 Gross per 24 hour  Intake 1010 ml  Output 0 ml  Net  1010 ml   Filed Weights   08/06/17 0439 08/06/17 2103 08/07/17 2028  Weight: 61.5 kg (135 lb 9.3 oz) 60.1 kg (132 lb 7.9 oz) 60 kg (132 lb 4.4 oz)    Examination:  General exam: Appears calm and comfortable  Respiratory system: Clear to auscultation. Respiratory effort normal. Cardiovascular system: S1 & S2 heard, RRR. No JVD, murmurs, rubs, gallops or clicks. No pedal edema. Gastrointestinal system: Abdomen is nondistended, soft and nontender. No organomegaly or masses felt. Normal bowel sounds heard. Central nervous system: Alert and oriented. No focal neurological deficits. Extremities: +left great toe amputated  Skin: +right foot in dressing  Psychiatry: Judgement and insight appear normal. Mood & affect appropriate.   Data Reviewed: I have personally reviewed following labs and imaging studies  CBC: Recent Labs  Lab 08/02/17 0554 08/03/17 0550 08/06/17 0507 08/07/17 0339 08/08/17 0420  WBC 8.9 8.1 9.2 10.3 11.1*  NEUTROABS 5.8 4.9  --   --   --   HGB 9.6* 9.0* 10.4* 10.4* 10.5*  HCT 29.1* 27.6* 32.0* 31.9* 31.8*  MCV 89.8 90.5 89.1 88.4 88.6  PLT 294 270 311 331 322   Basic Metabolic Panel: Recent Labs  Lab 08/03/17 0550 08/04/17 0549 08/06/17 0507 08/07/17 0339 08/08/17 0420  NA 137 134* 136 135 137  K 4.1 3.6 3.5 3.3* 3.0*  CL 112* 104 103 102 100*  CO2 20* 22 24 25 24   GLUCOSE 124* 186* 177* 155* 192*  BUN 14 13 13 14 12   CREATININE 1.34* 1.26* 1.34* 1.29* 1.48*  CALCIUM 8.0* 8.2* 8.2* 8.4* 8.7*  MG  --   --   --   --  1.2*   GFR: Estimated Creatinine Clearance: 24.3 mL/min (A) (by C-G formula based on SCr of 1.48 mg/dL (H)). Liver Function Tests: Recent Labs  Lab 08/02/17 0554 08/03/17 0550  AST 44* 32  ALT 20 18  ALKPHOS 64 59  BILITOT 0.5 0.4  PROT 6.6 6.0*  ALBUMIN 2.6* 2.4*   No results for input(s): LIPASE, AMYLASE in the last 168 hours. No results for input(s): AMMONIA in the last 168 hours. Coagulation Profile: No results for  input(s): INR, PROTIME in the last 168 hours. Cardiac Enzymes: Recent Labs  Lab 08/02/17 0554 08/03/17 0550 08/04/17 0549  CKTOTAL 1,514* 775* 679*   BNP (last 3 results) No results for input(s): PROBNP in the last 8760 hours. HbA1C: No results for input(s): HGBA1C in the last 72 hours. CBG: Recent Labs  Lab 08/07/17 1138 08/07/17 1639 08/07/17 2028 08/08/17 0733 08/08/17 1151  GLUCAP 122* 125* 197* 174* 261*   Lipid Profile: No results for input(s): CHOL, HDL, LDLCALC, TRIG, CHOLHDL, LDLDIRECT in the last 72 hours. Thyroid Function Tests: No results for input(s): TSH, T4TOTAL, FREET4, T3FREE, THYROIDAB in the last 72 hours. Anemia Panel: No results for input(s): VITAMINB12, FOLATE, FERRITIN, TIBC, IRON,  RETICCTPCT in the last 72 hours. Sepsis Labs: No results for input(s): PROCALCITON, LATICACIDVEN in the last 168 hours.  Recent Results (from the past 240 hour(s))  Surgical pcr screen     Status: None   Collection Time: 08/06/17  9:37 PM  Result Value Ref Range Status   MRSA, PCR NEGATIVE NEGATIVE Final   Staphylococcus aureus NEGATIVE NEGATIVE Final    Comment: (NOTE) The Xpert SA Assay (FDA approved for NASAL specimens in patients 57 years of age and older), is one component of a comprehensive surveillance program. It is not intended to diagnose infection nor to guide or monitor treatment.        Radiology Studies: No results found.    Scheduled Meds: . amLODipine  5 mg Oral Daily  . atorvastatin  40 mg Oral Daily  . cholecalciferol  1,000 Units Oral Daily  . docusate sodium  100 mg Oral BID  . folic acid  0.5 mg Oral Daily  . insulin aspart  0-5 Units Subcutaneous QHS  . insulin aspart  0-9 Units Subcutaneous TID WC  . levETIRAcetam  500 mg Oral BID  . metoprolol tartrate  50 mg Oral BID WC  . omega-3 acid ethyl esters  1 g Oral TID  . pantoprazole  40 mg Oral Daily  . pramipexole  0.5 mg Oral QHS  . vitamin E  400 Units Oral Daily   Continuous  Infusions: . sodium chloride 10 mL/hr at 08/07/17 2239  . methocarbamol (ROBAXIN)  IV    . piperacillin-tazobactam (ZOSYN)  IV 3.375 g (08/08/17 0917)  . vancomycin Stopped (08/06/17 1933)     LOS: 7 days    Time spent: 30 minutes   Dessa Phi, DO [facility] [email or web address] Password [email or web address] 08/08/2017, 1:38 PM

## 2017-08-09 ENCOUNTER — Inpatient Hospital Stay (HOSPITAL_COMMUNITY): Payer: Medicare HMO

## 2017-08-09 LAB — BASIC METABOLIC PANEL
ANION GAP: 9 (ref 5–15)
BUN: 16 mg/dL (ref 6–20)
CALCIUM: 8.6 mg/dL — AB (ref 8.9–10.3)
CO2: 23 mmol/L (ref 22–32)
CREATININE: 1.48 mg/dL — AB (ref 0.44–1.00)
Chloride: 103 mmol/L (ref 101–111)
GFR, EST AFRICAN AMERICAN: 38 mL/min — AB (ref >60)
GFR, EST NON AFRICAN AMERICAN: 32 mL/min — AB (ref >60)
Glucose, Bld: 266 mg/dL — ABNORMAL HIGH (ref 65–99)
Potassium: 3.7 mmol/L (ref 3.5–5.1)
Sodium: 135 mmol/L (ref 135–145)

## 2017-08-09 LAB — CBC
HEMATOCRIT: 31.1 % — AB (ref 36.0–46.0)
Hemoglobin: 10 g/dL — ABNORMAL LOW (ref 12.0–15.0)
MCH: 29 pg (ref 26.0–34.0)
MCHC: 32.2 g/dL (ref 30.0–36.0)
MCV: 90.1 fL (ref 78.0–100.0)
PLATELETS: 309 10*3/uL (ref 150–400)
RBC: 3.45 MIL/uL — ABNORMAL LOW (ref 3.87–5.11)
RDW: 14 % (ref 11.5–15.5)
WBC: 8.6 10*3/uL (ref 4.0–10.5)

## 2017-08-09 LAB — GLUCOSE, CAPILLARY
GLUCOSE-CAPILLARY: 191 mg/dL — AB (ref 65–99)
GLUCOSE-CAPILLARY: 133 mg/dL — AB (ref 65–99)
GLUCOSE-CAPILLARY: 274 mg/dL — AB (ref 65–99)
Glucose-Capillary: 191 mg/dL — ABNORMAL HIGH (ref 65–99)

## 2017-08-09 NOTE — Progress Notes (Signed)
PROGRESS NOTE    Sara Griffith  DGU:[id number] DOB: [birth year] DOA: 07/31/2017 PCP: Kathyrn Lass, MD     Brief Narrative:  Sara Griffith is a 81 y.o. female with medical history significant of hypertension, hyperlipidemia, diabetes mellitus, TIA, GERD, RLS, PVD, CKD-III, recent fall which caused small ICH, who presents with fall and left groin pain. Pt states that she slid off the couch when she was trying to go to the bathroom around 7 AM. She could not get up until she was found by the home health nurse who visited her this afternoon. She states that she has pain in the left groin area which is constant, sharp, 5 out of 10 in severity, nonradiating. She was admitted for treatment of rhabdomyolysis, AKI, as well as diabetic foot ulcers.   Of note, patient fell November 23 while visiting family and was admitted to the hospital in [place] where she had a scalp laceration and small ICH. She was admitted to the ICU and discharged back to her facility.  Assessment & Plan:   Principal Problem:   Fall Active Problems:   RLS (restless legs syndrome)   GERD (gastroesophageal reflux disease)   HLD (hyperlipidemia)   HTN (hypertension)   Type II diabetes mellitus with renal manifestations (HCC)   Left groin pain   Rhabdomyolysis   History of intracranial hemorrhage   Acute renal failure superimposed on stage 3 chronic kidney disease (HCC)   Ulcer of toe of right foot (HCC)   Chronic osteomyelitis of toe, right (HCC)   Rhabdomyolysis after mechanical fall  -Resolved   AKI on CKD stage III -Baseline Cr is1.2 -Likely due to prerenal secondary to rhabdo, dehydration, continuation of ARB -Holdcozaar -Cr stable now, monitor   Bilateral diabetic foot ulcers -Left foot MRI negative for osteomyelitis  -Right great toe +osteomyelitis of first distal phalanx  -ABI unremarkable -S/p right great toe amputation 12/7 with Dr. Sharol Given  -Now off IV antibiotics and recovering well   Noninsulin  dependent Type II diabetes mellitus with renal manifestations -Ha1c 7.4. Patient is takingTradjentaat home -SSI  Recent fall with small ICH -CT head showed stable slightly decreased extra-axial hemorrhage along the interhemispheric fissure as compared to previous -Continue keppra   HTN -Continue norvasc, lopressor   HLD -Continue lipitor   Cough -Check CXR    DVT prophylaxis: SCD  Code Status: DNR Family Communication: Daughter over the phone 12/8  Disposition Plan: SNF once prior auth complete    Consultants:   Orthopedic surgery   Procedures:   None   Antimicrobials:  Anti-infectives (From admission, onward)   Start     Dose/Rate Route Frequency Ordered Stop   08/07/17 0800  ceFAZolin (ANCEF) IVPB 2g/100 mL premix     2 g 200 mL/hr over 30 Minutes Intravenous To ShortStay Surgical 08/07/17 0129 08/07/17 1403   08/04/17 1800  vancomycin (VANCOCIN) IVPB 750 mg/150 ml premix     750 mg 150 mL/hr over 60 Minutes Intravenous Every 48 hours 08/02/17 1717 08/08/17 1848   08/02/17 1800  vancomycin (VANCOCIN) 1,250 mg in sodium chloride 0.9 % 250 mL IVPB     1,250 mg 166.7 mL/hr over 90 Minutes Intravenous  Once 08/02/17 1717 08/02/17 1949   08/02/17 1800  piperacillin-tazobactam (ZOSYN) IVPB 3.375 g     3.375 g 12.5 mL/hr over 240 Minutes Intravenous Every 8 hours 08/02/17 1717 08/08/17 2359       Subjective: Doing well this morning. Complains of cough   Objective: Vitals:   08/08/17  1715 08/08/17 2216 08/09/17 0427 08/09/17 1023  BP: (!) 140/58 (!) 143/73 (!) 134/59 (!) 149/65  Pulse: 64 70 63 67  Resp: 17 15 16    Temp: 98.2 F (36.8 C) 98.3 F (36.8 C) 98 F (36.7 C)   TempSrc: Oral     SpO2: 92% 99% 93%   Weight:  61.5 kg (135 lb 9.3 oz)    Height:        Intake/Output Summary (Last 24 hours) at 08/09/2017 1234 Last data filed at 08/09/2017 1030 Gross per 24 hour  Intake 1607.17 ml  Output 0 ml  Net 1607.17 ml   Filed Weights   08/06/17 2103  08/07/17 2028 08/08/17 2216  Weight: 60.1 kg (132 lb 7.9 oz) 60 kg (132 lb 4.4 oz) 61.5 kg (135 lb 9.3 oz)    Examination:  General exam: Appears calm and comfortable  Respiratory system: Crackles right base. Respiratory effort normal. Cardiovascular system: S1 & S2 heard, RRR. No JVD, murmurs, rubs, gallops or clicks. No pedal edema. Gastrointestinal system: Abdomen is nondistended, soft and nontender. No organomegaly or masses felt. Normal bowel sounds heard. Central nervous system: Alert and oriented. No focal neurological deficits. Extremities: +left great toe amputated  Skin: +right foot in dressing  Psychiatry: Judgement and insight appear normal. Mood & affect appropriate.   Data Reviewed: I have personally reviewed following labs and imaging studies  CBC: Recent Labs  Lab 08/03/17 0550 08/06/17 0507 08/07/17 0339 08/08/17 0420 08/09/17 0421  WBC 8.1 9.2 10.3 11.1* 8.6  NEUTROABS 4.9  --   --   --   --   HGB 9.0* 10.4* 10.4* 10.5* 10.0*  HCT 27.6* 32.0* 31.9* 31.8* 31.1*  MCV 90.5 89.1 88.4 88.6 90.1  PLT 270 311 331 329 573   Basic Metabolic Panel: Recent Labs  Lab 08/04/17 0549 08/06/17 0507 08/07/17 0339 08/08/17 0420 08/09/17 0421  NA 134* 136 135 137 135  K 3.6 3.5 3.3* 3.0* 3.7  CL 104 103 102 100* 103  CO2 22 24 25 24 23   GLUCOSE 186* 177* 155* 192* 266*  BUN 13 13 14 12 16   CREATININE 1.26* 1.34* 1.29* 1.48* 1.48*  CALCIUM 8.2* 8.2* 8.4* 8.7* 8.6*  MG  --   --   --  1.2*  --    GFR: Estimated Creatinine Clearance: 24.6 mL/min (A) (by C-G formula based on SCr of 1.48 mg/dL (H)). Liver Function Tests: Recent Labs  Lab 08/03/17 0550  AST 32  ALT 18  ALKPHOS 59  BILITOT 0.4  PROT 6.0*  ALBUMIN 2.4*   No results for input(s): LIPASE, AMYLASE in the last 168 hours. No results for input(s): AMMONIA in the last 168 hours. Coagulation Profile: No results for input(s): INR, PROTIME in the last 168 hours. Cardiac Enzymes: Recent Labs  Lab  08/03/17 0550 08/04/17 0549  CKTOTAL 775* 679*   BNP (last 3 results) No results for input(s): PROBNP in the last 8760 hours. HbA1C: No results for input(s): HGBA1C in the last 72 hours. CBG: Recent Labs  Lab 08/08/17 1151 08/08/17 1713 08/08/17 2209 08/09/17 0746 08/09/17 1159  GLUCAP 261* 152* 155* 191* 191*   Lipid Profile: No results for input(s): CHOL, HDL, LDLCALC, TRIG, CHOLHDL, LDLDIRECT in the last 72 hours. Thyroid Function Tests: No results for input(s): TSH, T4TOTAL, FREET4, T3FREE, THYROIDAB in the last 72 hours. Anemia Panel: No results for input(s): VITAMINB12, FOLATE, FERRITIN, TIBC, IRON, RETICCTPCT in the last 72 hours. Sepsis Labs: No results for input(s): PROCALCITON,  LATICACIDVEN in the last 168 hours.  Recent Results (from the past 240 hour(s))  Surgical pcr screen     Status: None   Collection Time: 08/06/17  9:37 PM  Result Value Ref Range Status   MRSA, PCR NEGATIVE NEGATIVE Final   Staphylococcus aureus NEGATIVE NEGATIVE Final    Comment: (NOTE) The Xpert SA Assay (FDA approved for NASAL specimens in patients 62 years of age and older), is one component of a comprehensive surveillance program. It is not intended to diagnose infection nor to guide or monitor treatment.        Radiology Studies: No results found.    Scheduled Meds: . amLODipine  5 mg Oral Daily  . atorvastatin  40 mg Oral Daily  . cholecalciferol  1,000 Units Oral Daily  . docusate sodium  100 mg Oral BID  . folic acid  0.5 mg Oral Daily  . insulin aspart  0-5 Units Subcutaneous QHS  . insulin aspart  0-9 Units Subcutaneous TID WC  . levETIRAcetam  500 mg Oral BID  . metoprolol tartrate  50 mg Oral BID WC  . omega-3 acid ethyl esters  1 g Oral TID  . pantoprazole  40 mg Oral Daily  . pramipexole  0.5 mg Oral QHS  . vitamin E  400 Units Oral Daily   Continuous Infusions: . sodium chloride 10 mL/hr at 08/07/17 2239  . methocarbamol (ROBAXIN)  IV       LOS: 8  days    Time spent: 30 minutes   Dessa Phi, DO [facility] [email or web address] Password [facility] 08/09/2017, 12:34 PM

## 2017-08-09 NOTE — Care Management Note (Signed)
Case Management Note Previous CM note completed by Adron Bene, RN 08/07/2017, 4:41 PM   Patient Details  Name: Sara Griffith MRN: [id number] Date of Birth: 04-06-1936  Subjective/Objective:       CM following for progression and d/c planning.              Action/Plan: 08/07/2017 Noted CM referral for d/c needs, however pt surgery today 08/07/17, unclear if she will be able to d/c to home with [facility] or will require short term reha until pt has PT eval.  CM will follow for needs and orders.   Expected Discharge Date:                  Expected Discharge Plan:  Skilled Nursing Facility  In-House Referral:  Clinical Social Work  Discharge planning Services  CM Consult  Post Acute Care Choice:  Durable Medical Equipment, Home Health Choice offered to:     DME Arranged:    DME Agency:     HH Arranged:    [place] Agency:     Status of Service:  In process, will continue to follow  If discussed at Long Length of Stay Meetings, dates discussed:    Discharge Disposition:   Additional Comments:  08/09/17- 1400- Taelynn Mcelhannon RN, CM- pt s/p RIGHT GREAT TOE AMPUTATION AT METATARSOPHALANGEAL JOINT (Right)--  Pt from [place]- is active with [facility] services- per PT/OT evals- recommendations for SNF- CSW following - not sure pt will be agreeable- Has RW at home- if returns home will need [place] resumed and maybe a 3n1. - CM will follow.    Dahlia Client [place], RN 08/09/2017, 2:07 PM Weekend 54M coverage [phone]

## 2017-08-09 NOTE — Plan of Care (Signed)
  Clinical Measurements: Ability to maintain clinical measurements within normal limits will improve 08/09/2017 1944 - Progressing by Irish Lack, RN   Safety: Ability to remain free from injury will improve 08/09/2017 1944 - Progressing by Irish Lack, RN   Pain Managment: General experience of comfort will improve 08/09/2017 1944 - Progressing by Irish Lack, RN

## 2017-08-10 ENCOUNTER — Encounter (HOSPITAL_COMMUNITY): Payer: Self-pay | Admitting: Orthopedic Surgery

## 2017-08-10 LAB — CBC
HCT: 31.8 % — ABNORMAL LOW (ref 36.0–46.0)
Hemoglobin: 10.4 g/dL — ABNORMAL LOW (ref 12.0–15.0)
MCH: 29.1 pg (ref 26.0–34.0)
MCHC: 32.7 g/dL (ref 30.0–36.0)
MCV: 89.1 fL (ref 78.0–100.0)
PLATELETS: 349 10*3/uL (ref 150–400)
RBC: 3.57 MIL/uL — AB (ref 3.87–5.11)
RDW: 13.6 % (ref 11.5–15.5)
WBC: 8.8 10*3/uL (ref 4.0–10.5)

## 2017-08-10 LAB — GLUCOSE, CAPILLARY
GLUCOSE-CAPILLARY: 194 mg/dL — AB (ref 65–99)
GLUCOSE-CAPILLARY: 171 mg/dL — AB (ref 65–99)
Glucose-Capillary: 151 mg/dL — ABNORMAL HIGH (ref 65–99)
Glucose-Capillary: 222 mg/dL — ABNORMAL HIGH (ref 65–99)

## 2017-08-10 LAB — BASIC METABOLIC PANEL
ANION GAP: 11 (ref 5–15)
BUN: 14 mg/dL (ref 6–20)
CALCIUM: 9.1 mg/dL (ref 8.9–10.3)
CO2: 24 mmol/L (ref 22–32)
Chloride: 101 mmol/L (ref 101–111)
Creatinine, Ser: 1.25 mg/dL — ABNORMAL HIGH (ref 0.44–1.00)
GFR, EST AFRICAN AMERICAN: 46 mL/min — AB (ref >60)
GFR, EST NON AFRICAN AMERICAN: 40 mL/min — AB (ref >60)
Glucose, Bld: 178 mg/dL — ABNORMAL HIGH (ref 65–99)
POTASSIUM: 3.8 mmol/L (ref 3.5–5.1)
Sodium: 136 mmol/L (ref 135–145)

## 2017-08-10 MED ORDER — LOSARTAN POTASSIUM 50 MG PO TABS: 50.0000 mg | ORAL_TABLET | Freq: Every day | ORAL | 0 refills | Status: AC

## 2017-08-10 NOTE — Discharge Summary (Addendum)
Physician Discharge Summary  Sara Griffith IEP:[id number] DOB: 1935-12-07 DOA: 07/31/2017  PCP: Kathyrn Lass, MD  Admit date: 07/31/2017 Discharge date: 08/11/2017  Admitted From: Home Disposition: SNF  Recommendations for Outpatient Follow-up:  1. Follow up with PCP in 1 week 2. Follow up with Dr. Sharol Given in 1 week 3. Touchdown weightbearing on right  4. Keep operative dressing clean and dry change at the office follow-up in 1 week. 5. Please obtain BMP/CBC in 1 week   Discharge Condition: Stable CODE STATUS: DNR  Diet recommendation: Heart healthy   Brief/Interim Summary:  Sara Griffith a 81 y.o.femalewith medical history significant ofhypertension, hyperlipidemia, diabetes mellitus, TIA, GERD, RLS, PVD, CKD-III, recent fall which caused small ICH, whopresents with fall and left groin pain. Pt states that sheslid off the couchwhen she was trying to go to the bathroomaround 7 AM. She could notget up until she was found by the home health nurse who visited her this afternoon.She states that she has pain in the left groin area which isconstant, sharp, 5 out of 10 in severity, nonradiating. She was admitted for treatment of rhabdomyolysis, AKI, as well as diabetic foot ulcers. Of note, patient fell November 23 while visiting family and was admitted to the hospital in [place] where she had a scalp laceration andsmall ICH.She was admitted to the ICU and discharged back to her facility.  She underwent right great toe amputation on 12/7 with Dr. Sharol Given. Clinically she continued to improve. AKI improved as well as rhabdomyolysis. Antibiotics were completed after 24 hours post-op. She worked with PT OT who recommended SNF.   Discharge Diagnoses:  Principal Problem:   Fall Active Problems:   RLS (restless legs syndrome)   GERD (gastroesophageal reflux disease)   HLD (hyperlipidemia)   HTN (hypertension)   Type II diabetes mellitus with renal manifestations (HCC)   Left groin pain    Rhabdomyolysis   History of intracranial hemorrhage   Acute renal failure superimposed on stage 3 chronic kidney disease (HCC)   Ulcer of toe of right foot (HCC)   Chronic osteomyelitis of toe, right (HCC)   Rhabdomyolysis after mechanical fall  -Resolved   AKI on CKD stage III -Baseline Cr is1.2 -Likely due to prerenal secondary to rhabdo, dehydration, continuation of ARB -Cr stable now, monitor -Resume cozaar at lower dose 100mg  --> 50mg  daily   Bilateral diabetic foot ulcers -Left foot MRI negative for osteomyelitis  -Right great toe +osteomyelitis of first distal phalanx  -ABIunremarkable -S/p right great toe amputation 12/7 with Dr. Sharol Given  -Now off IV antibiotics and recovering well. No pain due to neuropathy   Noninsulin dependent Type II diabetes mellitus with renal manifestations -Ha1c 7.4. Patient is takingTradjentaat home -SSI  Recent fall with small ICH -CT head showed stable slightly decreased extra-axial hemorrhage along the interhemispheric fissure as compared to previous -Continue keppra   HTN -Continue norvasc, lopressor, cozaar   HLD -Continue lipitor   Cough -CXR 12/9 without acute change. Cough improved today.     Discharge Instructions  Discharge Instructions    Call MD for:  difficulty breathing, headache or visual disturbances   Complete by:  As directed    Call MD for:  extreme fatigue   Complete by:  As directed    Call MD for:  hives   Complete by:  As directed    Call MD for:  persistant dizziness or light-headedness   Complete by:  As directed    Call MD for:  persistant nausea and vomiting  Complete by:  As directed    Call MD for:  redness, tenderness, or signs of infection (pain, swelling, redness, odor or green/yellow discharge around incision site)   Complete by:  As directed    Call MD for:  severe uncontrolled pain   Complete by:  As directed    Call MD for:  temperature >100.4   Complete by:  As directed     Diet - low sodium heart healthy   Complete by:  As directed    Discharge instructions   Complete by:  As directed    You were cared for by a hospitalist during your hospital stay. If you have any questions about your discharge medications or the care you received while you were in the hospital after you are discharged, you can call the unit and asked to speak with the hospitalist on call if the hospitalist that took care of you is not available. Once you are discharged, your primary care physician will handle any further medical issues. Please note that NO REFILLS for any discharge medications will be authorized once you are discharged, as it is imperative that you return to your primary care physician (or establish a relationship with a primary care physician if you do not have one) for your aftercare needs so that they can reassess your need for medications and monitor your lab values.   Discharge wound care:   Complete by:  As directed    Keep operative dressing clean and dry change at the office follow-up in 1 week.   Increase activity slowly   Complete by:  As directed      Allergies as of 08/10/2017      Reactions   Cymbalta [duloxetine Hcl] Swelling   SWELLING REACTION UNSPECIFIED    Librax [chlordiazepoxide-clidinium] Swelling   SWELLING REACTION UNSPECIFIED    Lyrica [pregabalin] Swelling   SWELLING REACTION UNSPECIFIED    Naproxen Swelling   SWELLING REACTION UNSPECIFIED       Medication List    TAKE these medications   acetaminophen 325 MG tablet Commonly known as:  TYLENOL Take 650 mg by mouth every 6 (six) hours as needed for mild pain, moderate pain or headache.   amLODipine 5 MG tablet Commonly known as:  NORVASC TAKE 1 TABLET (5 MG TOTAL) BY MOUTH DAILY.   atorvastatin 40 MG tablet Commonly known as:  LIPITOR Take 40 mg by mouth daily.   cholecalciferol 1000 units tablet Commonly known as:  VITAMIN D Take 1,000 Units by mouth daily.   collagenase  ointment Commonly known as:  SANTYL Apply 1 application topically daily.   folic acid 1 MG tablet Commonly known as:  FOLVITE Take 0.5 mg by mouth daily.   levETIRAcetam 500 MG tablet Commonly known as:  KEPPRA Take 500 mg by mouth 2 (two) times daily.   linagliptin 5 MG Tabs tablet Commonly known as:  TRADJENTA Take 5 mg by mouth daily.   losartan 50 MG tablet Commonly known as:  COZAAR Take 1 tablet (50 mg total) by mouth daily. What changed:    medication strength  how much to take   metoprolol tartrate 50 MG tablet Commonly known as:  LOPRESSOR Take 1 tablet (50 mg total) by mouth 2 (two) times daily with a meal.   omega-3 acid ethyl esters 1 g capsule Commonly known as:  LOVAZA Take 1 g by mouth 3 (three) times daily.   omeprazole 40 MG capsule Commonly known as:  PRILOSEC Take 40 mg by  mouth daily.   OVER THE COUNTER MEDICATION Place 1 drop into both eyes as needed (dry eyes). Over the counter dry eye drops.   pramipexole 0.5 MG tablet Commonly known as:  MIRAPEX Take 0.5 mg by mouth at bedtime.   psyllium 58.6 % packet Commonly known as:  METAMUCIL Take 1 packet by mouth daily as needed (constipation).   traMADol 50 MG tablet Commonly known as:  ULTRAM Take 50 mg by mouth 3 (three) times daily.   vitamin E 400 UNIT capsule Take 400 Units by mouth daily.            Discharge Care Instructions  (From admission, onward)        Start     Ordered   08/10/17 0000  Discharge wound care:    Comments:  Keep operative dressing clean and dry change at the office follow-up in 1 week.   08/10/17 1111     Follow-up Information    Newt Minion, MD Follow up in 1 week(s).   Specialty:  Orthopedic Surgery Contact information: [place] [address] [phone]        Kathyrn Lass, MD. Schedule an appointment as soon as possible for a visit in 1 week(s).   Specialty:  Family Medicine Contact information: [place] [address] [phone]          Allergies  Allergen Reactions  . Cymbalta [Duloxetine Hcl] Swelling    SWELLING REACTION UNSPECIFIED   . Librax [Chlordiazepoxide-Clidinium] Swelling    SWELLING REACTION UNSPECIFIED   . Lyrica [Pregabalin] Swelling    SWELLING REACTION UNSPECIFIED   . Naproxen Swelling    SWELLING REACTION UNSPECIFIED     Consultations:  Orthopedic surgery    Procedures/Studies: Dg Chest 2 View  Result Date: 08/09/2017 CLINICAL DATA:  Chronic dry cough. EXAM: CHEST  2 VIEW COMPARISON:  PA and lateral chest 10/21/2016. FINDINGS: The lungs are clear. Mild prominence of the pulmonary interstitium is unchanged. Heart size is normal. Aortic atherosclerosis is noted. No pneumothorax or pleural effusion. Remote compression fracture at the thoracolumbar junction is unchanged. IMPRESSION: No acute disease. Electronically Signed   By: Inge Rise M.D.   On: 08/09/2017 13:04   Ct Head Wo Contrast  Result Date: 08/02/2017 CLINICAL DATA:  Initial evaluation for acute altered mental status recent intracranial hemorrhage. EXAM: CT HEAD WITHOUT CONTRAST TECHNIQUE: Contiguous axial images were obtained from the base of the skull through the vertex without intravenous contrast. COMPARISON:  Prior CT from 08/01/2017. FINDINGS: Brain: Stable atrophy with chronic microvascular ischemic disease. Small volume extra-axial hemorrhage positioned along the interhemispheric fissure anteriorly again seen, stable to slightly decreased relative to previous exam (series 2, image 17). No significant mass effect. No evidence for new or interval hemorrhage. No evidence for acute large vessel territory infarct. No mass lesion or midline shift. Stable ventricular prominence related to global parenchymal volume loss without hydrocephalus. No extra-axial fluid collection. Vascular: No hyperdense vessel. Scattered vascular calcifications noted within the carotid siphons. Skull:  Skin staples in place at the left parietal scalp. Scalp soft tissues otherwise unremarkable. Calvarium intact. Sinuses/Orbits: Globes and orbital soft tissues within normal limits. Visualized paranasal sinuses and mastoids are clear. Other: None. IMPRESSION: 1. Stable 2 slightly decreased small volume extra-axial hemorrhage along the interhemispheric fissure as compared to previous. No mass effect. 2. No other new intracranial abnormality. 3. Stable atrophy with chronic small vessel ischemic disease. Electronically Signed   By: Pincus Badder.D.  On: 08/02/2017 21:51   Ct Head Wo Contrast  Result Date: 08/01/2017 CLINICAL DATA:  Fall, ataxia EXAM: CT HEAD WITHOUT CONTRAST TECHNIQUE: Contiguous axial images were obtained from the base of the skull through the vertex without intravenous contrast. COMPARISON:  None. FINDINGS: Brain: No acute territorial infarction. Small amount of extra-axial inter hemispheric hemorrhage anteriorly. No significant mass effect. Moderate small vessel ischemic changes of the white matter. Moderate atrophy. Old lacunar infarct in the right basal ganglia. Vascular: No hyperdense vessel.  Carotid artery calcification. Skull: No fracture Sinuses/Orbits: Mucosal thickening in the ethmoid sinus. No acute orbital abnormality Other: Left posterior scalp swelling with cutaneous stable IMPRESSION: 1. Small amount of inter hemispheric extra-axial hemorrhage. No midline shift or significant mass effect 2. Atrophy and small vessel ischemic changes of the white matter Critical Value/emergent results were called by telephone at the time of interpretation on 08/01/2017 at 3:06 am to Dr. Tomi Bamberger , who verbally acknowledged these results. Electronically Signed   By: Donavan Foil M.D.   On: 08/01/2017 03:06   Ct Hip Left Wo Contrast  Result Date: 08/01/2017 CLINICAL DATA:  Left hip pain after fall. EXAM: CT OF THE LEFT HIP WITHOUT CONTRAST TECHNIQUE: Multidetector CT imaging of the left hip  was performed according to the standard protocol. Multiplanar CT image reconstructions were also generated. COMPARISON:  Radiographs yesterday. FINDINGS: Bones/Joint/Cartilage No fracture, cortical margins are intact. Mild osteoarthritis with joint space narrowing and spurring. No hip joint effusion. Pubic rami are intact. Ligaments Suboptimally assessed by CT. Muscles and Tendons Minimal greater trochanteric enthesopathy. No intramuscular hematoma. Soft tissues No confluent soft tissue hematoma. Prominent left groin lymph nodes are likely reactive. Vascular calcifications are seen. IMPRESSION: 1. No fracture of the left hip. 2. Mild osteoarthritis. Electronically Signed   By: Jeb Levering M.D.   On: 08/01/2017 03:15   Mr Foot Right Wo Contrast  Result Date: 08/01/2017 CLINICAL DATA:  Bilateral foot pain near toes. Evaluate for osteomyelitis. EXAM: MRI OF THE RIGHT FOREFOOT WITHOUT CONTRAST TECHNIQUE: Multiplanar, multisequence MR imaging of the right forefoot was performed. No intravenous contrast was administered. COMPARISON:  None. FINDINGS: Bones/Joint/Cartilage Increased STIR signal with corresponding decreased T1 marrow signal in the first distal phalanx. Remaining marrow signal is preserved. No acute fracture or malalignment. Muscles and Tendons Moderate fatty atrophy and edema within the intrinsic muscles of the forefoot, as can be seen with diabetic muscle changes. No evidence of tenosynovitis. Soft tissues Soft tissue ulceration along the plantar tip of the great toe. No drainable fluid collection. IMPRESSION: 1. Soft tissue ulceration along the plantar tip of the great toe with underlying osteomyelitis of the first distal phalanx. Electronically Signed   By: Titus Dubin M.D.   On: 08/01/2017 15:36   Mr Foot Left Wo Contrast  Result Date: 08/03/2017 CLINICAL DATA:  Left foot swelling and pain in a diabetic patient with an extensive history of foot ulcers. History of prior left great toe  amputation. EXAM: MRI OF THE LEFT FOOT WITHOUT CONTRAST TECHNIQUE: Multiplanar, multisequence MR imaging of the left foot was performed. No intravenous contrast was administered. COMPARISON:  MRI left foot 08/01/2017. FINDINGS: Bones/Joint/Cartilage The patient is status post amputation at the level of the base of the first metatarsal. No bone marrow signal abnormality to suggest osteomyelitis is identified. No fracture or stress change. Ligaments Intact. Muscles and Tendons Marked atrophy of intrinsic musculature the foot is present. No strain or tear. No evidence of infectious or inflammatory process. Soft tissues No abscess is  identified. IMPRESSION: Negative for osteomyelitis.  No acute abnormality. Status amputation of the first ray at the level of the base of the first metatarsal. Electronically Signed   By: Inge Rise M.D.   On: 08/03/2017 09:39   Mr Foot Left Wo Contrast  Result Date: 08/01/2017 CLINICAL DATA:  Bilateral foot pain.  Evaluate for osteomyelitis. EXAM: MRI OF THE LEFT FOOT WITHOUT CONTRAST TECHNIQUE: Multiplanar, multisequence MR imaging of the left forefoot was performed. No intravenous contrast was administered. COMPARISON:  None. FINDINGS: Despite efforts by the technologist and patient, motion artifact is present on today's exam and could not be eliminated. This reduces exam sensitivity and specificity. Bones/Joint/Cartilage Prior amputation of the great toe to the level of the proximal metatarsal. No definite bone marrow signal abnormality to suggest osteomyelitis. No acute fracture or malalignment. Muscles and Tendons Diffuse fatty atrophy of the intrinsic muscles of the forefoot. No definite evidence of tenosynovitis. Soft tissues No drainable fluid collection. IMPRESSION: 1. Extremely limited study due to motion artifact. No definite evidence of osteomyelitis or drainable fluid collection. Consider repeating the study when the patient is better able to tolerate it. 2. Prior  great toe amputation to the base of the proximal metatarsal. Electronically Signed   By: Titus Dubin M.D.   On: 08/01/2017 15:16   Dg Hip Unilat W Or Wo Pelvis 2-3 Views Left  Result Date: 08/01/2017 CLINICAL DATA:  Left hip pain after fall at home 1 week ago. EXAM: DG HIP (WITH OR WITHOUT PELVIS) 2-3V LEFT COMPARISON:  None. FINDINGS: The cortical margins of the bony pelvis and left hip are intact. No fracture. Pubic symphysis and sacroiliac joints are congruent. Both femoral heads are well-seated in the respective acetabula. IMPRESSION: No fracture of the pelvis or left hip. Electronically Signed   By: Jeb Levering M.D.   On: 08/01/2017 00:30       Discharge Exam: Vitals:   08/10/17 0343 08/10/17 0754  BP: (!) 156/62 (!) 162/70  Pulse: 67 73  Resp: 18 18  Temp: 98.3 F (36.8 C) 98.3 F (36.8 C)  SpO2: 93% 94%   Vitals:   08/09/17 1818 08/09/17 2056 08/10/17 0343 08/10/17 0754  BP: (!) 147/63 (!) 144/56 (!) 156/62 (!) 162/70  Pulse: 68 65 67 73  Resp:  18 18 18   Temp:  98.4 F (36.9 C) 98.3 F (36.8 C) 98.3 F (36.8 C)  TempSrc:  Oral Oral Oral  SpO2:  96% 93% 94%  Weight:  61.5 kg (135 lb 9.3 oz)    Height:        General: Pt is alert, awake, not in acute distress Cardiovascular: RRR, S1/S2 +, no rubs, no gallops Respiratory: CTA bilaterally, no wheezing, no rhonchi Abdominal: Soft, NT, ND, bowel sounds + Extremities: no edema, no cyanosis,+left great toe amputated, +right foot in dry dressing     The results of significant diagnostics from this hospitalization (including imaging, microbiology, ancillary and laboratory) are listed below for reference.     Microbiology: Recent Results (from the past 240 hour(s))  Surgical pcr screen     Status: None   Collection Time: 08/06/17  9:37 PM  Result Value Ref Range Status   MRSA, PCR NEGATIVE NEGATIVE Final   Staphylococcus aureus NEGATIVE NEGATIVE Final    Comment: (NOTE) The Xpert SA Assay (FDA approved for  NASAL specimens in patients 78 years of age and older), is one component of a comprehensive surveillance program. It is not intended to diagnose infection nor to  guide or monitor treatment.      Labs: BNP (last 3 results) Recent Labs    10/21/16 1437  BNP 678.9*   Basic Metabolic Panel: Recent Labs  Lab 08/06/17 0507 08/07/17 0339 08/08/17 0420 08/09/17 0421 08/10/17 0439  NA 136 135 137 135 136  K 3.5 3.3* 3.0* 3.7 3.8  CL 103 102 100* 103 101  CO2 24 25 24 23 24   GLUCOSE 177* 155* 192* 266* 178*  BUN 13 14 12 16 14   CREATININE 1.34* 1.29* 1.48* 1.48* 1.25*  CALCIUM 8.2* 8.4* 8.7* 8.6* 9.1  MG  --   --  1.2*  --   --    Liver Function Tests: No results for input(s): AST, ALT, ALKPHOS, BILITOT, PROT, ALBUMIN in the last 168 hours. No results for input(s): LIPASE, AMYLASE in the last 168 hours. No results for input(s): AMMONIA in the last 168 hours. CBC: Recent Labs  Lab 08/06/17 0507 08/07/17 0339 08/08/17 0420 08/09/17 0421 08/10/17 0439  WBC 9.2 10.3 11.1* 8.6 8.8  HGB 10.4* 10.4* 10.5* 10.0* 10.4*  HCT 32.0* 31.9* 31.8* 31.1* 31.8*  MCV 89.1 88.4 88.6 90.1 89.1  PLT 311 331 329 309 349   Cardiac Enzymes: Recent Labs  Lab 08/04/17 0549  CKTOTAL 679*   BNP: Invalid input(s): POCBNP CBG: Recent Labs  Lab 08/09/17 0746 08/09/17 1159 08/09/17 1646 08/09/17 2054 08/10/17 0739  GLUCAP 191* 191* 133* 274* 151*   D-Dimer No results for input(s): DDIMER in the last 72 hours. Hgb A1c No results for input(s): HGBA1C in the last 72 hours. Lipid Profile No results for input(s): CHOL, HDL, LDLCALC, TRIG, CHOLHDL, LDLDIRECT in the last 72 hours. Thyroid function studies No results for input(s): TSH, T4TOTAL, T3FREE, THYROIDAB in the last 72 hours.  Invalid input(s): FREET3 Anemia work up No results for input(s): VITAMINB12, FOLATE, FERRITIN, TIBC, IRON, RETICCTPCT in the last 72 hours. Urinalysis    Component Value Date/Time   COLORURINE YELLOW  07/31/2017 1930   APPEARANCEUR CLEAR 07/31/2017 1930   LABSPEC 1.017 07/31/2017 1930   PHURINE 6.0 07/31/2017 1930   GLUCOSEU NEGATIVE 07/31/2017 1930   HGBUR NEGATIVE 07/31/2017 [place] NEGATIVE 07/31/2017 [place] NEGATIVE 07/31/2017 1930   PROTEINUR 30 (A) 07/31/2017 1930   NITRITE NEGATIVE 07/31/2017 1930   LEUKOCYTESUR NEGATIVE 07/31/2017 1930   Sepsis Labs Invalid input(s): PROCALCITONIN,  WBC,  LACTICIDVEN Microbiology Recent Results (from the past 240 hour(s))  Surgical pcr screen     Status: None   Collection Time: 08/06/17  9:37 PM  Result Value Ref Range Status   MRSA, PCR NEGATIVE NEGATIVE Final   Staphylococcus aureus NEGATIVE NEGATIVE Final    Comment: (NOTE) The Xpert SA Assay (FDA approved for NASAL specimens in patients 23 years of age and older), is one component of a comprehensive surveillance program. It is not intended to diagnose infection nor to guide or monitor treatment.      Time coordinating discharge: 40 minutes  SIGNED:  Dessa Phi, DO [facility] Pager [phone]  If 7PM-7AM, please contact night-coverage [email or web address] Password [email or web address] 08/10/2017, 11:13 AM

## 2017-08-10 NOTE — Progress Notes (Addendum)
CSW following for discharge planning. CSW alerted by [facility] that patient would prefer [facility]; CSW contacted facility and they are unable to take new patients today due to limited staffing. CSW received update from [facility] that patient just wants a facility that is as close to [place] as possible. CSW attempted to contact [facility] to discuss referral; no one is available to discuss admissions at this time. CSW contacted [facility] to discuss bed offer and admission today.   Insurance authorization request has been submitted to [facility] for admission to [facility]. CSW has asked [facility] if they are able to take an LOG pending auth for the patient to admit today; awaiting decision.  CSW will continue to follow.  Laveda Abbe, [place] Clinical Social Worker [phone]   UPDATE 3:30 PM:  CSW received call from Admissions at [facility] that the facility does not have access to their pharmacy today due to inclement weather and unsafe travel conditions; will not be able to get the patient's medications today. Patient will not be able to admit to SNF today. MD aware.  CSW will continue to follow.  Laveda Abbe, [place] Clinical Social Worker [phone]

## 2017-08-10 NOTE — Anesthesia Postprocedure Evaluation (Signed)
Anesthesia Post Note  Patient: Sara Griffith  Procedure(s) Performed: RIGHT GREAT TOE AMPUTATION AT METATARSOPHALANGEAL JOINT (Right Toe)     Patient location during evaluation: PACU Anesthesia Type: MAC Level of consciousness: awake and alert Pain management: pain level controlled Vital Signs Assessment: post-procedure vital signs reviewed and stable Respiratory status: spontaneous breathing, nonlabored ventilation, respiratory function stable and patient connected to nasal cannula oxygen Cardiovascular status: stable and blood pressure returned to baseline Postop Assessment: no apparent nausea or vomiting Anesthetic complications: no    Last Vitals:  Vitals:   08/10/17 0343 08/10/17 0754  BP: (!) 156/62 (!) 162/70  Pulse: 67 73  Resp: 18 18  Temp: 36.8 C 36.8 C  SpO2: 93% 94%    Last Pain:  Vitals:   08/10/17 0754  TempSrc: Oral  PainSc:                  Sara Griffith,Sara Griffith

## 2017-08-10 NOTE — Progress Notes (Signed)
Patient ID: Sara Griffith, female   DOB: 08-23-1936, 81 y.o.   MRN: [id number] Patient is status post amputation of right great toe.  The dressing is clean and dry.  Patient states she has difficulty maintaining touchdown weightbearing.  I will change instructions to weightbearing as tolerated with a postoperative shoe does have a independent living.  I will follow-up in the office in 1 week.  Continue with the postoperative dressing until seen in the office in follow-up.  No antibiotics needed for discharge.

## 2017-08-10 NOTE — Progress Notes (Signed)
Physical Therapy Treatment Patient Details Name: Sara Griffith MRN: [id number] DOB: 1936-08-20 Today's Date: 08/10/2017    History of Present Illness  Sara Griffith is a 81 y.o. female with medical history significant of hypertension, hyperlipidemia, diabetes mellitus, TIA, GERD, RLS, PVD, CKD-III, recent fall which caused small ICH, who presents with fall and left groin pain. Pt is now s/p R great toe amputation with TDWB R LE orders.    PT Comments    Pt is motivated to get up to chair, very willing to try to use walker and is concerned she cannot tolerate mobility on LLE only.  However, she is able to get to the chair with cues and min assist, but not really able to perform without significant cues for sequence.  Her plan continues to be acute therapy, progressing to SNF.  Focus on efficacy with standing control of RLE WB and progress to more gait as tolerated.  Follow Up Recommendations  SNF     Equipment Recommendations  None recommended by PT    Recommendations for Other Services       Precautions / Restrictions Precautions Precautions: Fall Restrictions Weight Bearing Restrictions: Yes RLE Weight Bearing: Touchdown weight bearing    Mobility  Bed Mobility Overal bed mobility: Needs Assistance Bed Mobility: Supine to Sit     Supine to sit: Min assist     General bed mobility comments: used minor help to assist trunk and to scoot to EOB with bed pad to avoid LLE stress  Transfers Overall transfer level: Needs assistance Equipment used: Rolling walker (2 wheeled) Transfers: Sit to/from Stand Sit to Stand: Min assist         General transfer comment: cued her technique to maintain TDWB to NWB on R foot  Ambulation/Gait             General Gait Details: pivot to chair only and minor shifting backward to set up for sitting   Stairs            Wheelchair Mobility    Modified Rankin (Stroke Patients Only)       Balance Overall balance  assessment: Needs assistance Sitting-balance support: Feet supported Sitting balance-Leahy Scale: Fair     Standing balance support: Bilateral upper extremity supported;During functional activity Standing balance-Leahy Scale: Poor Standing balance comment: needs UE's to avoid loss of balance with LLE only on the floor                            Cognition Arousal/Alertness: Awake/alert Behavior During Therapy: WFL for tasks assessed/performed Overall Cognitive Status: No family/caregiver present to determine baseline cognitive functioning Area of Impairment: Safety/judgement;Awareness;Problem solving;Attention                   Current Attention Level: Selective     Safety/Judgement: Decreased awareness of deficits;Decreased awareness of safety Awareness: Intellectual Problem Solving: Slow processing;Requires verbal cues        Exercises General Exercises - Lower Extremity Ankle Circles/Pumps: AROM;AAROM;Both;5 reps Quad Sets: AROM;Both;10 reps Gluteal Sets: AROM;Both;10 reps    General Comments General comments (skin integrity, edema, etc.): pt is already getting up to the chair with nursing per her report, but is open to sitting in chair today.  Has clean bandage on R foot and some exposure of underlying foot with the current bandage      Pertinent Vitals/Pain Pain Assessment: No/denies pain    Home Living  Prior Function            PT Goals (current goals can now be found in the care plan section) Progress towards PT goals: Progressing toward goals    Frequency    Min 3X/week      PT Plan Current plan remains appropriate    Co-evaluation              AM-PAC PT "6 Clicks" Daily Activity  Outcome Measure  Difficulty turning over in bed (including adjusting bedclothes, sheets and blankets)?: A Little Difficulty moving from lying on back to sitting on the side of the bed? : Unable Difficulty sitting  down on and standing up from a chair with arms (e.g., wheelchair, bedside commode, etc,.)?: Unable Help needed moving to and from a bed to chair (including a wheelchair)?: A Little Help needed walking in hospital room?: A Little Help needed climbing 3-5 steps with a railing? : Total 6 Click Score: 12    End of Session Equipment Utilized During Treatment: Gait belt Activity Tolerance: Patient limited by fatigue Patient left: in chair;with call bell/phone within reach;with chair alarm set;with nursing/sitter in room Nurse Communication: Mobility status PT Visit Diagnosis: Other abnormalities of gait and mobility (R26.89);Muscle weakness (generalized) (M62.81)     Time: 4403-4742 PT Time Calculation (min) (ACUTE ONLY): 31 min  Charges:  $Therapeutic Exercise: 8-22 mins $Therapeutic Activity: 8-22 mins                    G Codes:  Functional Assessment Tool Used: AM-PAC 6 Clicks Basic Mobility    Ramond Dial 08/10/2017, 12:30 PM   Mee Hives, PT MS Acute Rehab Dept. Number: [place] [facility] [place]

## 2017-08-10 NOTE — Progress Notes (Addendum)
1300 Bedside shift report. Pt resting in chair, NAD, denies pain, SOB, n/v. Fall precautions in place, [facility].  1400 Pt assessed, see flowsheet. Pt awaiting case manager regarding D/C to [facility]. WCTM.   1645 Pt still resting in chair, NAD, fall precautions in place. WCTM.   1850 Pt resting comfortably in chair, NAD, updated with POC and informed we are still not sure what rehab she is going to be discharged to. Awaiting new RN for shift report.

## 2017-08-11 DIAGNOSIS — M868X7 Other osteomyelitis, ankle and foot: Secondary | ICD-10-CM | POA: Diagnosis not present

## 2017-08-11 DIAGNOSIS — G2581 Restless legs syndrome: Secondary | ICD-10-CM | POA: Diagnosis not present

## 2017-08-11 DIAGNOSIS — E785 Hyperlipidemia, unspecified: Secondary | ICD-10-CM | POA: Diagnosis not present

## 2017-08-11 DIAGNOSIS — N289 Disorder of kidney and ureter, unspecified: Secondary | ICD-10-CM | POA: Diagnosis not present

## 2017-08-11 DIAGNOSIS — L97509 Non-pressure chronic ulcer of other part of unspecified foot with unspecified severity: Secondary | ICD-10-CM | POA: Diagnosis not present

## 2017-08-11 DIAGNOSIS — E0822 Diabetes mellitus due to underlying condition with diabetic chronic kidney disease: Secondary | ICD-10-CM | POA: Diagnosis not present

## 2017-08-11 DIAGNOSIS — I10 Essential (primary) hypertension: Secondary | ICD-10-CM | POA: Diagnosis not present

## 2017-08-11 DIAGNOSIS — S06890D Other specified intracranial injury without loss of consciousness, subsequent encounter: Secondary | ICD-10-CM | POA: Diagnosis not present

## 2017-08-11 DIAGNOSIS — R278 Other lack of coordination: Secondary | ICD-10-CM | POA: Diagnosis not present

## 2017-08-11 DIAGNOSIS — Z89421 Acquired absence of other right toe(s): Secondary | ICD-10-CM | POA: Diagnosis not present

## 2017-08-11 DIAGNOSIS — N183 Chronic kidney disease, stage 3 (moderate): Secondary | ICD-10-CM | POA: Diagnosis not present

## 2017-08-11 DIAGNOSIS — R41841 Cognitive communication deficit: Secondary | ICD-10-CM | POA: Diagnosis not present

## 2017-08-11 DIAGNOSIS — Z4789 Encounter for other orthopedic aftercare: Secondary | ICD-10-CM | POA: Diagnosis not present

## 2017-08-11 DIAGNOSIS — R531 Weakness: Secondary | ICD-10-CM | POA: Diagnosis not present

## 2017-08-11 DIAGNOSIS — R2689 Other abnormalities of gait and mobility: Secondary | ICD-10-CM | POA: Diagnosis not present

## 2017-08-11 DIAGNOSIS — M6281 Muscle weakness (generalized): Secondary | ICD-10-CM | POA: Diagnosis not present

## 2017-08-11 DIAGNOSIS — E1122 Type 2 diabetes mellitus with diabetic chronic kidney disease: Secondary | ICD-10-CM | POA: Diagnosis not present

## 2017-08-11 DIAGNOSIS — I739 Peripheral vascular disease, unspecified: Secondary | ICD-10-CM | POA: Diagnosis not present

## 2017-08-11 DIAGNOSIS — K219 Gastro-esophageal reflux disease without esophagitis: Secondary | ICD-10-CM | POA: Diagnosis not present

## 2017-08-11 LAB — CREATININE, SERUM
CREATININE: 1.24 mg/dL — AB (ref 0.44–1.00)
GFR calc Af Amer: 47 mL/min — ABNORMAL LOW (ref >60)
GFR calc non Af Amer: 40 mL/min — ABNORMAL LOW (ref >60)

## 2017-08-11 LAB — GLUCOSE, CAPILLARY
GLUCOSE-CAPILLARY: 209 mg/dL — AB (ref 65–99)
Glucose-Capillary: 155 mg/dL — ABNORMAL HIGH (ref 65–99)

## 2017-08-11 NOTE — Progress Notes (Signed)
Give report to [place] R.N of [place].

## 2017-08-11 NOTE — Progress Notes (Signed)
Orthopedic Tech Progress Note Patient Details:  Sara Griffith 1936-01-12 [id number]  Ortho Devices Type of Ortho Device: Postop shoe/boot Ortho Device/Splint Interventions: Application   Post Interventions Patient Tolerated: Well Instructions Provided: Care of device   [name] 08/11/2017, 11:59 AM

## 2017-08-11 NOTE — Progress Notes (Signed)
  PROGRESS NOTE  Patient discharged yesterday but due to weather, SNF did not have access to their pharmacy. Patient without new complaints, doing well overall. Stable for DC when SNF available to take patient.    Dessa Phi, DO [facility] [email or web address] Password [email or web address] 08/11/2017, 8:11 AM

## 2017-08-12 DIAGNOSIS — S06890D Other specified intracranial injury without loss of consciousness, subsequent encounter: Secondary | ICD-10-CM | POA: Diagnosis not present

## 2017-08-12 DIAGNOSIS — I10 Essential (primary) hypertension: Secondary | ICD-10-CM | POA: Diagnosis not present

## 2017-08-12 DIAGNOSIS — M868X7 Other osteomyelitis, ankle and foot: Secondary | ICD-10-CM | POA: Diagnosis not present

## 2017-08-12 DIAGNOSIS — E1122 Type 2 diabetes mellitus with diabetic chronic kidney disease: Secondary | ICD-10-CM | POA: Diagnosis not present

## 2017-08-14 DIAGNOSIS — G2581 Restless legs syndrome: Secondary | ICD-10-CM | POA: Diagnosis not present

## 2017-08-14 DIAGNOSIS — K219 Gastro-esophageal reflux disease without esophagitis: Secondary | ICD-10-CM | POA: Diagnosis not present

## 2017-08-14 DIAGNOSIS — N289 Disorder of kidney and ureter, unspecified: Secondary | ICD-10-CM | POA: Diagnosis not present

## 2017-08-14 DIAGNOSIS — R531 Weakness: Secondary | ICD-10-CM | POA: Diagnosis not present

## 2017-08-14 DIAGNOSIS — R2689 Other abnormalities of gait and mobility: Secondary | ICD-10-CM | POA: Diagnosis not present

## 2017-08-14 DIAGNOSIS — Z89421 Acquired absence of other right toe(s): Secondary | ICD-10-CM | POA: Diagnosis not present

## 2017-08-14 DIAGNOSIS — I739 Peripheral vascular disease, unspecified: Secondary | ICD-10-CM | POA: Diagnosis not present

## 2017-08-14 DIAGNOSIS — I10 Essential (primary) hypertension: Secondary | ICD-10-CM | POA: Diagnosis not present

## 2017-08-14 DIAGNOSIS — E0822 Diabetes mellitus due to underlying condition with diabetic chronic kidney disease: Secondary | ICD-10-CM | POA: Diagnosis not present

## 2017-08-18 ENCOUNTER — Ambulatory Visit (INDEPENDENT_AMBULATORY_CARE_PROVIDER_SITE_OTHER): Payer: Medicare HMO | Admitting: Orthopedic Surgery

## 2017-08-18 ENCOUNTER — Encounter (INDEPENDENT_AMBULATORY_CARE_PROVIDER_SITE_OTHER): Payer: Self-pay | Admitting: Orthopedic Surgery

## 2017-08-18 ENCOUNTER — Other Ambulatory Visit: Payer: Self-pay | Admitting: *Deleted

## 2017-08-18 DIAGNOSIS — S98111A Complete traumatic amputation of right great toe, initial encounter: Secondary | ICD-10-CM

## 2017-08-18 DIAGNOSIS — L97919 Non-pressure chronic ulcer of unspecified part of right lower leg with unspecified severity: Secondary | ICD-10-CM

## 2017-08-18 DIAGNOSIS — I87331 Chronic venous hypertension (idiopathic) with ulcer and inflammation of right lower extremity: Secondary | ICD-10-CM

## 2017-08-18 DIAGNOSIS — Z89411 Acquired absence of right great toe: Secondary | ICD-10-CM

## 2017-08-18 NOTE — Progress Notes (Signed)
Office Visit Note   Patient: Sara Griffith           Date of Birth: 23-Nov-1935           MRN: [id number] Visit Date: 08/18/2017              Requested by: Kathyrn Lass, [place], [place] [address] PCP: Kathyrn Lass, MD  Chief Complaint  Patient presents with  . Right Foot - Routine Post Op    Right Great Toe Amputation      HPI: Patient is a 81 year old woman resident at [facility] who is one-week status post amputation right great toe through the MTP joint.  Assessment & Plan: Visit Diagnoses:  1. Amputated great toe of right foot ([place])     Plan: Patient may increase her ambulation with weightbearing as tolerated with the postoperative shoe using a walker.  Patient is to wash her foot with soap and water daily wear the knee-high medical compression stocking change the stocking daily and wear around the clock.  Patient is to elevate her foot to decrease the venous stasis swelling.  Harvest the sutures at follow-up in 2 weeks.  Follow-Up Instructions: Return in about 2 weeks (around 09/01/2017).   Ortho Exam  Patient is alert, oriented, no adenopathy, well-dressed, normal affect, normal respiratory effort. Examination patient is ambulating in a wheelchair.  The surgical incision is healing nicely there is no dehiscence no drainage no cellulitis.  Patient does have venostasis swelling in her leg and superficial blisters in the proximal aspect of her calf.  Imaging: No results found. No images are attached to the encounter.  Labs: Lab Results  Component Value Date   HGBA1C 7.4 (H) 08/02/2017   HGBA1C 8.0 (H) 10/21/2016   HGBA1C 7.9 (H) 07/03/2016    @LABSALLVALUES (HGBA1)@  There is no height or weight on file to calculate BMI.  Orders:  No orders of the defined types were placed in this encounter.  No orders of the defined types were placed in this encounter.    Procedures: No procedures performed  Clinical Data: No additional  findings.  ROS:  All other systems negative, except as noted in the HPI. Review of Systems  Objective: Vital Signs: LMP  (LMP Unknown)   Specialty Comments:  No specialty comments available.  PMFS History: Patient Active Problem List   Diagnosis Date Noted  . Amputated great toe of right foot ([place]) 08/18/2017  . Chronic osteomyelitis of toe, right ([place])   . Type II diabetes mellitus with renal manifestations ([place]) 08/01/2017  . Fall 08/01/2017  . Left groin pain 08/01/2017  . Rhabdomyolysis 08/01/2017  . History of intracranial hemorrhage 08/01/2017  . Acute renal failure superimposed on stage 3 chronic kidney disease ([place]) 08/01/2017  . Ulcer of toe of right foot ([place]) 08/01/2017  . Fall at home, initial encounter   . Chest pressure 10/21/2016  . Chest pain at rest   . Hypertensive emergency   . Type 2 diabetes mellitus without complication, without long-term current use of insulin ([place])   . RLS (restless legs syndrome) 07/01/2016  . Cervical lymphadenitis 07/01/2016  . GERD (gastroesophageal reflux disease) 07/01/2016  . HLD (hyperlipidemia) 07/01/2016  . HTN (hypertension) 07/01/2016  . Lymphadenitis 07/01/2016   Past Medical History:  Diagnosis Date  . CKD (chronic kidney disease)   . Diabetes mellitus without complication ([place])   . GERD (gastroesophageal reflux disease)   . HLD (hyperlipidemia)   . Hypertension   . IDA (iron  deficiency anemia)   . PVD (peripheral vascular disease) ([place])   . RLS (restless legs syndrome)   . TIA (transient ischemic attack)   . Uses walker   . Vitamin D deficiency     Family History  Problem Relation Age of Onset  . Hypertension Mother   . Cancer Father   . Arrhythmia Sister        ppm  . Arrhythmia Brother        ppm    Past Surgical History:  Procedure Laterality Date  . ABDOMINAL HYSTERECTOMY     with bladder tac  . AMPUTATION Right 08/07/2017   Procedure: RIGHT GREAT TOE AMPUTATION AT METATARSOPHALANGEAL JOINT;   Surgeon: Newt Minion, MD;  Location: [facility];  Service: Orthopedics;  Laterality: Right;  . APPENDECTOMY    . CARPAL TUNNEL RELEASE Right   . CATARACT EXTRACTION, BILATERAL    . CERVICAL DISC SURGERY    . COLONOSCOPY    . DILATION AND CURETTAGE OF UTERUS     x 2  . left great toe amputation    . ROTATOR CUFF REPAIR Left    Social History   Occupational History  . Not on file  Tobacco Use  . Smoking status: Former Smoker    Years: 30.00    Types: Cigarettes  . Smokeless tobacco: Former Systems developer    Types: Snuff    Quit date: 07/01/1954  Substance and Sexual Activity  . Alcohol use: No  . Drug use: No  . Sexual activity: Not on file

## 2017-08-18 NOTE — Patient Outreach (Signed)
[facility]) Care Management  08/18/2017  Sara Griffith 1936/08/11 [id number]   Met with patient at bedside of facility.  Patient reports she is ready to go home, she reports she has an MD appointment today to see how she is doing and she hope she will get the ok to go home. She is at facility due to partial amputation. She reports she has diabetes and has "terrible neuropathy".  She is partial weightbearing, hopes to go to full weightbearing status after her appointment today.   Patient reports she lives in [place], they provide her meals and transportation. Her daughter is a Immunologist and fills up her medboxes.   RNCM reviewed [facility] program with patient, patient does not feels she needs [facility] at this time. RNCM left brochure and RNCM contact for patient to review with her daughter for future reference.  Plan to sign off at this time. Royetta Crochet. Laymond Purser, RN, BSN, [place] [phone]) Business Cell  [phone]) Toll Free Office

## 2017-08-19 DIAGNOSIS — M868X7 Other osteomyelitis, ankle and foot: Secondary | ICD-10-CM | POA: Diagnosis not present

## 2017-08-19 DIAGNOSIS — I10 Essential (primary) hypertension: Secondary | ICD-10-CM | POA: Diagnosis not present

## 2017-08-19 DIAGNOSIS — L97509 Non-pressure chronic ulcer of other part of unspecified foot with unspecified severity: Secondary | ICD-10-CM | POA: Diagnosis not present

## 2017-08-19 DIAGNOSIS — E1122 Type 2 diabetes mellitus with diabetic chronic kidney disease: Secondary | ICD-10-CM | POA: Diagnosis not present

## 2017-08-28 DIAGNOSIS — R41841 Cognitive communication deficit: Secondary | ICD-10-CM | POA: Diagnosis not present

## 2017-08-28 DIAGNOSIS — K219 Gastro-esophageal reflux disease without esophagitis: Secondary | ICD-10-CM | POA: Diagnosis not present

## 2017-08-28 DIAGNOSIS — I129 Hypertensive chronic kidney disease with stage 1 through stage 4 chronic kidney disease, or unspecified chronic kidney disease: Secondary | ICD-10-CM | POA: Diagnosis not present

## 2017-08-28 DIAGNOSIS — G2581 Restless legs syndrome: Secondary | ICD-10-CM | POA: Diagnosis not present

## 2017-08-28 DIAGNOSIS — Z4781 Encounter for orthopedic aftercare following surgical amputation: Secondary | ICD-10-CM | POA: Diagnosis not present

## 2017-08-28 DIAGNOSIS — E1122 Type 2 diabetes mellitus with diabetic chronic kidney disease: Secondary | ICD-10-CM | POA: Diagnosis not present

## 2017-08-28 DIAGNOSIS — G40909 Epilepsy, unspecified, not intractable, without status epilepticus: Secondary | ICD-10-CM | POA: Diagnosis not present

## 2017-08-28 DIAGNOSIS — E785 Hyperlipidemia, unspecified: Secondary | ICD-10-CM | POA: Diagnosis not present

## 2017-08-28 DIAGNOSIS — N183 Chronic kidney disease, stage 3 (moderate): Secondary | ICD-10-CM | POA: Diagnosis not present

## 2017-08-30 DIAGNOSIS — E785 Hyperlipidemia, unspecified: Secondary | ICD-10-CM | POA: Diagnosis not present

## 2017-08-30 DIAGNOSIS — G2581 Restless legs syndrome: Secondary | ICD-10-CM | POA: Diagnosis not present

## 2017-08-30 DIAGNOSIS — Z4781 Encounter for orthopedic aftercare following surgical amputation: Secondary | ICD-10-CM | POA: Diagnosis not present

## 2017-08-30 DIAGNOSIS — K219 Gastro-esophageal reflux disease without esophagitis: Secondary | ICD-10-CM | POA: Diagnosis not present

## 2017-08-30 DIAGNOSIS — G40909 Epilepsy, unspecified, not intractable, without status epilepticus: Secondary | ICD-10-CM | POA: Diagnosis not present

## 2017-08-30 DIAGNOSIS — E1122 Type 2 diabetes mellitus with diabetic chronic kidney disease: Secondary | ICD-10-CM | POA: Diagnosis not present

## 2017-08-30 DIAGNOSIS — N183 Chronic kidney disease, stage 3 (moderate): Secondary | ICD-10-CM | POA: Diagnosis not present

## 2017-08-30 DIAGNOSIS — I129 Hypertensive chronic kidney disease with stage 1 through stage 4 chronic kidney disease, or unspecified chronic kidney disease: Secondary | ICD-10-CM | POA: Diagnosis not present

## 2017-08-30 DIAGNOSIS — R41841 Cognitive communication deficit: Secondary | ICD-10-CM | POA: Diagnosis not present

## 2017-08-31 DIAGNOSIS — G2581 Restless legs syndrome: Secondary | ICD-10-CM | POA: Diagnosis not present

## 2017-08-31 DIAGNOSIS — E785 Hyperlipidemia, unspecified: Secondary | ICD-10-CM | POA: Diagnosis not present

## 2017-08-31 DIAGNOSIS — K219 Gastro-esophageal reflux disease without esophagitis: Secondary | ICD-10-CM | POA: Diagnosis not present

## 2017-08-31 DIAGNOSIS — Z4781 Encounter for orthopedic aftercare following surgical amputation: Secondary | ICD-10-CM | POA: Diagnosis not present

## 2017-08-31 DIAGNOSIS — N183 Chronic kidney disease, stage 3 (moderate): Secondary | ICD-10-CM | POA: Diagnosis not present

## 2017-08-31 DIAGNOSIS — E1122 Type 2 diabetes mellitus with diabetic chronic kidney disease: Secondary | ICD-10-CM | POA: Diagnosis not present

## 2017-08-31 DIAGNOSIS — R41841 Cognitive communication deficit: Secondary | ICD-10-CM | POA: Diagnosis not present

## 2017-08-31 DIAGNOSIS — I129 Hypertensive chronic kidney disease with stage 1 through stage 4 chronic kidney disease, or unspecified chronic kidney disease: Secondary | ICD-10-CM | POA: Diagnosis not present

## 2017-08-31 DIAGNOSIS — G40909 Epilepsy, unspecified, not intractable, without status epilepticus: Secondary | ICD-10-CM | POA: Diagnosis not present

## 2017-09-02 DIAGNOSIS — I129 Hypertensive chronic kidney disease with stage 1 through stage 4 chronic kidney disease, or unspecified chronic kidney disease: Secondary | ICD-10-CM | POA: Diagnosis not present

## 2017-09-02 DIAGNOSIS — Z4781 Encounter for orthopedic aftercare following surgical amputation: Secondary | ICD-10-CM | POA: Diagnosis not present

## 2017-09-02 DIAGNOSIS — K219 Gastro-esophageal reflux disease without esophagitis: Secondary | ICD-10-CM | POA: Diagnosis not present

## 2017-09-02 DIAGNOSIS — R41841 Cognitive communication deficit: Secondary | ICD-10-CM | POA: Diagnosis not present

## 2017-09-02 DIAGNOSIS — E1122 Type 2 diabetes mellitus with diabetic chronic kidney disease: Secondary | ICD-10-CM | POA: Diagnosis not present

## 2017-09-02 DIAGNOSIS — E785 Hyperlipidemia, unspecified: Secondary | ICD-10-CM | POA: Diagnosis not present

## 2017-09-02 DIAGNOSIS — N183 Chronic kidney disease, stage 3 (moderate): Secondary | ICD-10-CM | POA: Diagnosis not present

## 2017-09-02 DIAGNOSIS — G40909 Epilepsy, unspecified, not intractable, without status epilepticus: Secondary | ICD-10-CM | POA: Diagnosis not present

## 2017-09-02 DIAGNOSIS — G2581 Restless legs syndrome: Secondary | ICD-10-CM | POA: Diagnosis not present

## 2017-09-03 DIAGNOSIS — N183 Chronic kidney disease, stage 3 (moderate): Secondary | ICD-10-CM | POA: Diagnosis not present

## 2017-09-03 DIAGNOSIS — I129 Hypertensive chronic kidney disease with stage 1 through stage 4 chronic kidney disease, or unspecified chronic kidney disease: Secondary | ICD-10-CM | POA: Diagnosis not present

## 2017-09-03 DIAGNOSIS — G40909 Epilepsy, unspecified, not intractable, without status epilepticus: Secondary | ICD-10-CM | POA: Diagnosis not present

## 2017-09-03 DIAGNOSIS — E785 Hyperlipidemia, unspecified: Secondary | ICD-10-CM | POA: Diagnosis not present

## 2017-09-03 DIAGNOSIS — R41841 Cognitive communication deficit: Secondary | ICD-10-CM | POA: Diagnosis not present

## 2017-09-03 DIAGNOSIS — Z4781 Encounter for orthopedic aftercare following surgical amputation: Secondary | ICD-10-CM | POA: Diagnosis not present

## 2017-09-03 DIAGNOSIS — K219 Gastro-esophageal reflux disease without esophagitis: Secondary | ICD-10-CM | POA: Diagnosis not present

## 2017-09-03 DIAGNOSIS — E1122 Type 2 diabetes mellitus with diabetic chronic kidney disease: Secondary | ICD-10-CM | POA: Diagnosis not present

## 2017-09-03 DIAGNOSIS — G2581 Restless legs syndrome: Secondary | ICD-10-CM | POA: Diagnosis not present

## 2017-09-04 ENCOUNTER — Ambulatory Visit (INDEPENDENT_AMBULATORY_CARE_PROVIDER_SITE_OTHER): Payer: Medicare HMO | Admitting: Orthopedic Surgery

## 2017-09-04 ENCOUNTER — Encounter (INDEPENDENT_AMBULATORY_CARE_PROVIDER_SITE_OTHER): Payer: Self-pay | Admitting: Orthopedic Surgery

## 2017-09-04 DIAGNOSIS — S98111A Complete traumatic amputation of right great toe, initial encounter: Secondary | ICD-10-CM

## 2017-09-04 DIAGNOSIS — Z89411 Acquired absence of right great toe: Secondary | ICD-10-CM

## 2017-09-04 DIAGNOSIS — L03115 Cellulitis of right lower limb: Secondary | ICD-10-CM

## 2017-09-04 NOTE — Progress Notes (Signed)
Office Visit Note   Patient: Sara Griffith           Date of Birth: [birth year]           MRN: [id number] Visit Date: 09/04/2017              Requested by: Kathyrn Lass, [place], [place] [address] PCP: Kathyrn Lass, MD  Chief Complaint  Patient presents with  . Right Foot - Routine Post Op      HPI: Patient is a 82 year old woman resident at [facility] who is 4-weeks status post amputation right great toe through the MTP joint.  Assessment & Plan: Visit Diagnoses:  No diagnosis found.  Plan: two week course of doxycycline called in. Second toe concerning for osteomyelitis with swelling. clinical re-evaluation in 2 weeks. Sutures harvested today. Patient may increase her ambulation with weightbearing as tolerated with the postoperative shoe using a walker.  Patient is to wash her foot with soap and water daily wear the knee-high medical compression stocking change the stocking daily and wear around the clock.  Patient is to elevate her foot to decrease the venous stasis swelling.    Follow-Up Instructions: No Follow-up on file.   Ortho Exam  Patient is alert, oriented, no adenopathy, well-dressed, normal affect, normal respiratory effort. Examination patient is ambulating in a wheelchair.  The surgical incision is healing nicely there is no dehiscence no drainage. Erythema and warmth to foot globally. Sausage digit swelling to second toe. No open ulcers to second toe.  Patient does have venostasis swelling in her leg. Imaging: No results found. No images are attached to the encounter.  Labs: Lab Results  Component Value Date   HGBA1C 7.4 (H) 08/02/2017   HGBA1C 8.0 (H) 10/21/2016   HGBA1C 7.9 (H) 07/03/2016    @LABSALLVALUES (HGBA1)@  There is no height or weight on file to calculate BMI.  Orders:  No orders of the defined types were placed in this encounter.  No orders of the defined types were placed in this encounter.    Procedures: No  procedures performed  Clinical Data: No additional findings.  ROS:  All other systems negative, except as noted in the HPI. Review of Systems  Constitutional: Negative for chills and fever.  Cardiovascular: Positive for leg swelling.  Skin: Positive for color change. Negative for wound.    Objective: Vital Signs: LMP  (LMP Unknown)   Specialty Comments:  No specialty comments available.  PMFS History: Patient Active Problem List   Diagnosis Date Noted  . Amputated great toe of right foot ([place]) 08/18/2017  . Idiopathic chronic venous hypertension of right lower extremity with ulcer and inflammation ([place]) 08/18/2017  . Chronic osteomyelitis of toe, right ([place])   . Type II diabetes mellitus with renal manifestations ([place]) 08/01/2017  . Fall 08/01/2017  . Left groin pain 08/01/2017  . Rhabdomyolysis 08/01/2017  . History of intracranial hemorrhage 08/01/2017  . Acute renal failure superimposed on stage 3 chronic kidney disease ([place]) 08/01/2017  . Ulcer of toe of right foot ([place]) 08/01/2017  . Fall at home, initial encounter   . Chest pressure 10/21/2016  . Chest pain at rest   . Hypertensive emergency   . Type 2 diabetes mellitus without complication, without long-term current use of insulin ([place])   . RLS (restless legs syndrome) 07/01/2016  . Cervical lymphadenitis 07/01/2016  . GERD (gastroesophageal reflux disease) 07/01/2016  . HLD (hyperlipidemia) 07/01/2016  . HTN (hypertension) 07/01/2016  . Lymphadenitis 07/01/2016  Past Medical History:  Diagnosis Date  . CKD (chronic kidney disease)   . Diabetes mellitus without complication ([place])   . GERD (gastroesophageal reflux disease)   . HLD (hyperlipidemia)   . Hypertension   . IDA (iron deficiency anemia)   . PVD (peripheral vascular disease) ([place])   . RLS (restless legs syndrome)   . TIA (transient ischemic attack)   . Uses walker   . Vitamin D deficiency     Family History  Problem Relation Age of Onset  .  Hypertension Mother   . Cancer Father   . Arrhythmia Sister        ppm  . Arrhythmia Brother        ppm    Past Surgical History:  Procedure Laterality Date  . ABDOMINAL HYSTERECTOMY     with bladder tac  . AMPUTATION Right 08/07/2017   Procedure: RIGHT GREAT TOE AMPUTATION AT METATARSOPHALANGEAL JOINT;  Surgeon: Newt Minion, MD;  Location: [facility];  Service: Orthopedics;  Laterality: Right;  . APPENDECTOMY    . CARPAL TUNNEL RELEASE Right   . CATARACT EXTRACTION, BILATERAL    . CERVICAL DISC SURGERY    . COLONOSCOPY    . DILATION AND CURETTAGE OF UTERUS     x 2  . left great toe amputation    . ROTATOR CUFF REPAIR Left    Social History   Occupational History  . Not on file  Tobacco Use  . Smoking status: Former Smoker    Years: 30.00    Types: Cigarettes  . Smokeless tobacco: Former Systems developer    Types: Snuff    Quit date: 07/01/1954  Substance and Sexual Activity  . Alcohol use: No  . Drug use: No  . Sexual activity: Not on file

## 2017-09-05 MED ORDER — DOXYCYCLINE HYCLATE 100 MG PO TABS: 100.0000 mg | ORAL_TABLET | Freq: Two times a day (BID) | ORAL | 0 refills | Status: DC

## 2017-09-05 NOTE — Addendum Note (Signed)
Addended by: [name] on: 09/05/2017 11:48 AM   Modules accepted: Orders

## 2017-09-08 DIAGNOSIS — L97512 Non-pressure chronic ulcer of other part of right foot with fat layer exposed: Secondary | ICD-10-CM | POA: Diagnosis not present

## 2017-09-08 DIAGNOSIS — L03116 Cellulitis of left lower limb: Secondary | ICD-10-CM | POA: Diagnosis not present

## 2017-09-08 DIAGNOSIS — L97522 Non-pressure chronic ulcer of other part of left foot with fat layer exposed: Secondary | ICD-10-CM | POA: Diagnosis not present

## 2017-09-09 ENCOUNTER — Telehealth (INDEPENDENT_AMBULATORY_CARE_PROVIDER_SITE_OTHER): Payer: Self-pay | Admitting: Orthopedic Surgery

## 2017-09-09 NOTE — Telephone Encounter (Signed)
Pt daughter called and would like this med to be taken off med chart (Doxycycline). PCP provided new med that worked better for pt infection. Dr.Ziegler prescribed Bactrim for pt infection.   Please call pt daughter to discuss meds

## 2017-09-10 NOTE — Telephone Encounter (Signed)
Patient may discontinue doxycycline.

## 2017-09-10 NOTE — Telephone Encounter (Signed)
IC and advised.  

## 2017-09-15 DIAGNOSIS — E785 Hyperlipidemia, unspecified: Secondary | ICD-10-CM | POA: Diagnosis not present

## 2017-09-15 DIAGNOSIS — Z79899 Other long term (current) drug therapy: Secondary | ICD-10-CM | POA: Diagnosis not present

## 2017-09-15 DIAGNOSIS — L98499 Non-pressure chronic ulcer of skin of other sites with unspecified severity: Secondary | ICD-10-CM | POA: Diagnosis not present

## 2017-09-15 DIAGNOSIS — N189 Chronic kidney disease, unspecified: Secondary | ICD-10-CM | POA: Diagnosis not present

## 2017-09-15 DIAGNOSIS — L02619 Cutaneous abscess of unspecified foot: Secondary | ICD-10-CM | POA: Diagnosis not present

## 2017-09-15 DIAGNOSIS — G2581 Restless legs syndrome: Secondary | ICD-10-CM | POA: Diagnosis not present

## 2017-09-15 DIAGNOSIS — K219 Gastro-esophageal reflux disease without esophagitis: Secondary | ICD-10-CM | POA: Diagnosis not present

## 2017-09-15 DIAGNOSIS — I10 Essential (primary) hypertension: Secondary | ICD-10-CM | POA: Diagnosis not present

## 2017-09-15 DIAGNOSIS — E559 Vitamin D deficiency, unspecified: Secondary | ICD-10-CM | POA: Diagnosis not present

## 2017-09-15 DIAGNOSIS — E1149 Type 2 diabetes mellitus with other diabetic neurological complication: Secondary | ICD-10-CM | POA: Diagnosis not present

## 2017-09-15 DIAGNOSIS — G629 Polyneuropathy, unspecified: Secondary | ICD-10-CM | POA: Diagnosis not present

## 2017-09-17 DIAGNOSIS — E785 Hyperlipidemia, unspecified: Secondary | ICD-10-CM | POA: Diagnosis not present

## 2017-09-17 DIAGNOSIS — Z4781 Encounter for orthopedic aftercare following surgical amputation: Secondary | ICD-10-CM | POA: Diagnosis not present

## 2017-09-17 DIAGNOSIS — N183 Chronic kidney disease, stage 3 (moderate): Secondary | ICD-10-CM | POA: Diagnosis not present

## 2017-09-17 DIAGNOSIS — G40909 Epilepsy, unspecified, not intractable, without status epilepticus: Secondary | ICD-10-CM | POA: Diagnosis not present

## 2017-09-17 DIAGNOSIS — I129 Hypertensive chronic kidney disease with stage 1 through stage 4 chronic kidney disease, or unspecified chronic kidney disease: Secondary | ICD-10-CM | POA: Diagnosis not present

## 2017-09-17 DIAGNOSIS — G2581 Restless legs syndrome: Secondary | ICD-10-CM | POA: Diagnosis not present

## 2017-09-17 DIAGNOSIS — K219 Gastro-esophageal reflux disease without esophagitis: Secondary | ICD-10-CM | POA: Diagnosis not present

## 2017-09-17 DIAGNOSIS — R41841 Cognitive communication deficit: Secondary | ICD-10-CM | POA: Diagnosis not present

## 2017-09-17 DIAGNOSIS — E1122 Type 2 diabetes mellitus with diabetic chronic kidney disease: Secondary | ICD-10-CM | POA: Diagnosis not present

## 2017-09-22 DIAGNOSIS — G2581 Restless legs syndrome: Secondary | ICD-10-CM | POA: Diagnosis not present

## 2017-09-22 DIAGNOSIS — E1149 Type 2 diabetes mellitus with other diabetic neurological complication: Secondary | ICD-10-CM | POA: Diagnosis not present

## 2017-09-22 DIAGNOSIS — I10 Essential (primary) hypertension: Secondary | ICD-10-CM | POA: Diagnosis not present

## 2017-09-22 DIAGNOSIS — E1122 Type 2 diabetes mellitus with diabetic chronic kidney disease: Secondary | ICD-10-CM | POA: Diagnosis not present

## 2017-09-22 DIAGNOSIS — G629 Polyneuropathy, unspecified: Secondary | ICD-10-CM | POA: Diagnosis not present

## 2017-09-22 DIAGNOSIS — E785 Hyperlipidemia, unspecified: Secondary | ICD-10-CM | POA: Diagnosis not present

## 2017-09-22 DIAGNOSIS — I739 Peripheral vascular disease, unspecified: Secondary | ICD-10-CM | POA: Diagnosis not present

## 2017-09-22 DIAGNOSIS — Z23 Encounter for immunization: Secondary | ICD-10-CM | POA: Diagnosis not present

## 2017-09-22 DIAGNOSIS — Z0000 Encounter for general adult medical examination without abnormal findings: Secondary | ICD-10-CM | POA: Diagnosis not present

## 2017-09-24 ENCOUNTER — Ambulatory Visit (INDEPENDENT_AMBULATORY_CARE_PROVIDER_SITE_OTHER): Payer: Medicare HMO | Admitting: Orthopedic Surgery

## 2017-09-24 ENCOUNTER — Encounter (INDEPENDENT_AMBULATORY_CARE_PROVIDER_SITE_OTHER): Payer: Self-pay | Admitting: Orthopedic Surgery

## 2017-09-24 VITALS — Ht 59.0 in | Wt 135.0 lb

## 2017-09-24 DIAGNOSIS — Z89411 Acquired absence of right great toe: Secondary | ICD-10-CM

## 2017-09-24 DIAGNOSIS — E119 Type 2 diabetes mellitus without complications: Secondary | ICD-10-CM

## 2017-09-24 DIAGNOSIS — S98111A Complete traumatic amputation of right great toe, initial encounter: Secondary | ICD-10-CM

## 2017-09-24 NOTE — Progress Notes (Signed)
Office Visit Note   Patient: Sara Griffith           Date of Birth: December 18, 1935           MRN: [id number] Visit Date: 09/24/2017              Requested by: Kathyrn Lass, [place], [place] [address] PCP: Kathyrn Lass, MD  Chief Complaint  Patient presents with  . Right Foot - Routine Post Op    08/07/17 right great toe at MTP joint       HPI: Patient is a 82 year old woman resident at [facility] who status post amputation right great toe through the MTP joint.  Assessment & Plan: Visit Diagnoses:  1. Amputated great toe of right foot ([place])   2. Type 2 diabetes mellitus without complication, without long-term current use of insulin ([facility])     Plan:Patient may increase her ambulation with weightbearing as tolerated with the postoperative shoe using a walker.  Patient is to wash her foot with soap and water daily wear the knee-high medical compression stocking change the stocking daily and wear around the clock.  Patient is to elevate her foot to decrease the venous stasis swelling.    Follow-Up Instructions: Return in about 3 weeks (around 10/15/2017).   Ortho Exam  Patient is alert, oriented, no adenopathy, well-dressed, normal affect, normal respiratory effort. Examination patient is ambulating in a wheelchair.  The surgical incision is healing nicely there is no dehiscence no drainage. Mild dependent erythema to forefoot and toes. Improved swelling to right second toe. Distal ulcer is nearly healed, is 2 mm in diameter with no depth. Granulation in bed.   Imaging: No results found. No images are attached to the encounter.  Labs: Lab Results  Component Value Date   HGBA1C 7.4 (H) 08/02/2017   HGBA1C 8.0 (H) 10/21/2016   HGBA1C 7.9 (H) 07/03/2016    @LABSALLVALUES (HGBA1)@  Body mass index is 27.27 kg/m.  Orders:  No orders of the defined types were placed in this encounter.  No orders of the defined types were placed in this encounter.    Procedures: No procedures performed  Clinical Data: No additional findings.  ROS:  All other systems negative, except as noted in the HPI. Review of Systems  Constitutional: Negative for chills and fever.  Cardiovascular: Positive for leg swelling.  Skin: Positive for color change. Negative for wound.    Objective: Vital Signs: Ht 4\' 11"  (1.499 m)   Wt 135 lb (61.2 kg)   LMP  (LMP Unknown)   BMI 27.27 kg/m   Specialty Comments:  No specialty comments available.  PMFS History: Patient Active Problem List   Diagnosis Date Noted  . Amputated great toe of right foot ([place]) 08/18/2017  . Idiopathic chronic venous hypertension of right lower extremity with ulcer and inflammation ([place]) 08/18/2017  . Type II diabetes mellitus with renal manifestations ([place]) 08/01/2017  . Fall 08/01/2017  . Left groin pain 08/01/2017  . Rhabdomyolysis 08/01/2017  . History of intracranial hemorrhage 08/01/2017  . Acute renal failure superimposed on stage 3 chronic kidney disease ([place]) 08/01/2017  . Fall at home, initial encounter   . Chest pressure 10/21/2016  . Chest pain at rest   . Hypertensive emergency   . Type 2 diabetes mellitus without complication, without long-term current use of insulin ([place])   . RLS (restless legs syndrome) 07/01/2016  . Cervical lymphadenitis 07/01/2016  . GERD (gastroesophageal reflux disease) 07/01/2016  . HLD (hyperlipidemia)  07/01/2016  . HTN (hypertension) 07/01/2016  . Lymphadenitis 07/01/2016   Past Medical History:  Diagnosis Date  . Chronic osteomyelitis of toe, right ([place])   . CKD (chronic kidney disease)   . Diabetes mellitus without complication ([place])   . GERD (gastroesophageal reflux disease)   . HLD (hyperlipidemia)   . Hypertension   . IDA (iron deficiency anemia)   . PVD (peripheral vascular disease) ([place])   . RLS (restless legs syndrome)   . TIA (transient ischemic attack)   . Uses walker   . Vitamin D deficiency     Family History    Problem Relation Age of Onset  . Hypertension Mother   . Cancer Father   . Arrhythmia Sister        ppm  . Arrhythmia Brother        ppm    Past Surgical History:  Procedure Laterality Date  . ABDOMINAL HYSTERECTOMY     with bladder tac  . AMPUTATION Right 08/07/2017   Procedure: RIGHT GREAT TOE AMPUTATION AT METATARSOPHALANGEAL JOINT;  Surgeon: Newt Minion, MD;  Location: [facility];  Service: Orthopedics;  Laterality: Right;  . APPENDECTOMY    . CARPAL TUNNEL RELEASE Right   . CATARACT EXTRACTION, BILATERAL    . CERVICAL DISC SURGERY    . COLONOSCOPY    . DILATION AND CURETTAGE OF UTERUS     x 2  . left great toe amputation    . ROTATOR CUFF REPAIR Left    Social History   Occupational History  . Not on file  Tobacco Use  . Smoking status: Former Smoker    Years: 30.00    Types: Cigarettes  . Smokeless tobacco: Former Systems developer    Types: Snuff    Quit date: 07/01/1954  Substance and Sexual Activity  . Alcohol use: No  . Drug use: No  . Sexual activity: Not on file

## 2017-09-25 DIAGNOSIS — R41841 Cognitive communication deficit: Secondary | ICD-10-CM | POA: Diagnosis not present

## 2017-09-25 DIAGNOSIS — Z4781 Encounter for orthopedic aftercare following surgical amputation: Secondary | ICD-10-CM | POA: Diagnosis not present

## 2017-09-25 DIAGNOSIS — N183 Chronic kidney disease, stage 3 (moderate): Secondary | ICD-10-CM | POA: Diagnosis not present

## 2017-09-25 DIAGNOSIS — G40909 Epilepsy, unspecified, not intractable, without status epilepticus: Secondary | ICD-10-CM | POA: Diagnosis not present

## 2017-09-25 DIAGNOSIS — K219 Gastro-esophageal reflux disease without esophagitis: Secondary | ICD-10-CM | POA: Diagnosis not present

## 2017-09-25 DIAGNOSIS — E785 Hyperlipidemia, unspecified: Secondary | ICD-10-CM | POA: Diagnosis not present

## 2017-09-25 DIAGNOSIS — G2581 Restless legs syndrome: Secondary | ICD-10-CM | POA: Diagnosis not present

## 2017-09-25 DIAGNOSIS — E1122 Type 2 diabetes mellitus with diabetic chronic kidney disease: Secondary | ICD-10-CM | POA: Diagnosis not present

## 2017-09-25 DIAGNOSIS — I129 Hypertensive chronic kidney disease with stage 1 through stage 4 chronic kidney disease, or unspecified chronic kidney disease: Secondary | ICD-10-CM | POA: Diagnosis not present

## 2017-10-01 DIAGNOSIS — G2581 Restless legs syndrome: Secondary | ICD-10-CM | POA: Diagnosis not present

## 2017-10-01 DIAGNOSIS — Z4781 Encounter for orthopedic aftercare following surgical amputation: Secondary | ICD-10-CM | POA: Diagnosis not present

## 2017-10-01 DIAGNOSIS — E785 Hyperlipidemia, unspecified: Secondary | ICD-10-CM | POA: Diagnosis not present

## 2017-10-01 DIAGNOSIS — G40909 Epilepsy, unspecified, not intractable, without status epilepticus: Secondary | ICD-10-CM | POA: Diagnosis not present

## 2017-10-01 DIAGNOSIS — K219 Gastro-esophageal reflux disease without esophagitis: Secondary | ICD-10-CM | POA: Diagnosis not present

## 2017-10-01 DIAGNOSIS — R41841 Cognitive communication deficit: Secondary | ICD-10-CM | POA: Diagnosis not present

## 2017-10-01 DIAGNOSIS — I129 Hypertensive chronic kidney disease with stage 1 through stage 4 chronic kidney disease, or unspecified chronic kidney disease: Secondary | ICD-10-CM | POA: Diagnosis not present

## 2017-10-01 DIAGNOSIS — E1122 Type 2 diabetes mellitus with diabetic chronic kidney disease: Secondary | ICD-10-CM | POA: Diagnosis not present

## 2017-10-01 DIAGNOSIS — N183 Chronic kidney disease, stage 3 (moderate): Secondary | ICD-10-CM | POA: Diagnosis not present

## 2017-10-06 DIAGNOSIS — L97522 Non-pressure chronic ulcer of other part of left foot with fat layer exposed: Secondary | ICD-10-CM | POA: Diagnosis not present

## 2017-10-13 DIAGNOSIS — G40909 Epilepsy, unspecified, not intractable, without status epilepticus: Secondary | ICD-10-CM | POA: Diagnosis not present

## 2017-10-13 DIAGNOSIS — E1122 Type 2 diabetes mellitus with diabetic chronic kidney disease: Secondary | ICD-10-CM | POA: Diagnosis not present

## 2017-10-13 DIAGNOSIS — N183 Chronic kidney disease, stage 3 (moderate): Secondary | ICD-10-CM | POA: Diagnosis not present

## 2017-10-13 DIAGNOSIS — R41841 Cognitive communication deficit: Secondary | ICD-10-CM | POA: Diagnosis not present

## 2017-10-13 DIAGNOSIS — Z4781 Encounter for orthopedic aftercare following surgical amputation: Secondary | ICD-10-CM | POA: Diagnosis not present

## 2017-10-13 DIAGNOSIS — I129 Hypertensive chronic kidney disease with stage 1 through stage 4 chronic kidney disease, or unspecified chronic kidney disease: Secondary | ICD-10-CM | POA: Diagnosis not present

## 2017-10-13 DIAGNOSIS — K219 Gastro-esophageal reflux disease without esophagitis: Secondary | ICD-10-CM | POA: Diagnosis not present

## 2017-10-13 DIAGNOSIS — E785 Hyperlipidemia, unspecified: Secondary | ICD-10-CM | POA: Diagnosis not present

## 2017-10-13 DIAGNOSIS — G2581 Restless legs syndrome: Secondary | ICD-10-CM | POA: Diagnosis not present

## 2017-10-21 DIAGNOSIS — N183 Chronic kidney disease, stage 3 (moderate): Secondary | ICD-10-CM | POA: Diagnosis not present

## 2017-10-21 DIAGNOSIS — K219 Gastro-esophageal reflux disease without esophagitis: Secondary | ICD-10-CM | POA: Diagnosis not present

## 2017-10-21 DIAGNOSIS — E785 Hyperlipidemia, unspecified: Secondary | ICD-10-CM | POA: Diagnosis not present

## 2017-10-21 DIAGNOSIS — I129 Hypertensive chronic kidney disease with stage 1 through stage 4 chronic kidney disease, or unspecified chronic kidney disease: Secondary | ICD-10-CM | POA: Diagnosis not present

## 2017-10-21 DIAGNOSIS — G2581 Restless legs syndrome: Secondary | ICD-10-CM | POA: Diagnosis not present

## 2017-10-21 DIAGNOSIS — R41841 Cognitive communication deficit: Secondary | ICD-10-CM | POA: Diagnosis not present

## 2017-10-21 DIAGNOSIS — Z4781 Encounter for orthopedic aftercare following surgical amputation: Secondary | ICD-10-CM | POA: Diagnosis not present

## 2017-10-21 DIAGNOSIS — E1122 Type 2 diabetes mellitus with diabetic chronic kidney disease: Secondary | ICD-10-CM | POA: Diagnosis not present

## 2017-10-21 DIAGNOSIS — G40909 Epilepsy, unspecified, not intractable, without status epilepticus: Secondary | ICD-10-CM | POA: Diagnosis not present

## 2017-10-23 DIAGNOSIS — G2581 Restless legs syndrome: Secondary | ICD-10-CM | POA: Diagnosis not present

## 2017-10-23 DIAGNOSIS — G40909 Epilepsy, unspecified, not intractable, without status epilepticus: Secondary | ICD-10-CM | POA: Diagnosis not present

## 2017-10-23 DIAGNOSIS — E11621 Type 2 diabetes mellitus with foot ulcer: Secondary | ICD-10-CM | POA: Diagnosis not present

## 2017-10-23 DIAGNOSIS — E785 Hyperlipidemia, unspecified: Secondary | ICD-10-CM | POA: Diagnosis not present

## 2017-10-23 DIAGNOSIS — Z4781 Encounter for orthopedic aftercare following surgical amputation: Secondary | ICD-10-CM | POA: Diagnosis not present

## 2017-10-23 DIAGNOSIS — R41841 Cognitive communication deficit: Secondary | ICD-10-CM | POA: Diagnosis not present

## 2017-10-23 DIAGNOSIS — E1122 Type 2 diabetes mellitus with diabetic chronic kidney disease: Secondary | ICD-10-CM | POA: Diagnosis not present

## 2017-10-23 DIAGNOSIS — K219 Gastro-esophageal reflux disease without esophagitis: Secondary | ICD-10-CM | POA: Diagnosis not present

## 2017-10-23 DIAGNOSIS — L97512 Non-pressure chronic ulcer of other part of right foot with fat layer exposed: Secondary | ICD-10-CM | POA: Diagnosis not present

## 2017-10-23 DIAGNOSIS — L03115 Cellulitis of right lower limb: Secondary | ICD-10-CM | POA: Diagnosis not present

## 2017-10-23 DIAGNOSIS — I129 Hypertensive chronic kidney disease with stage 1 through stage 4 chronic kidney disease, or unspecified chronic kidney disease: Secondary | ICD-10-CM | POA: Diagnosis not present

## 2017-10-23 DIAGNOSIS — N183 Chronic kidney disease, stage 3 (moderate): Secondary | ICD-10-CM | POA: Diagnosis not present

## 2017-10-27 DIAGNOSIS — E1122 Type 2 diabetes mellitus with diabetic chronic kidney disease: Secondary | ICD-10-CM | POA: Diagnosis not present

## 2017-10-27 DIAGNOSIS — L97513 Non-pressure chronic ulcer of other part of right foot with necrosis of muscle: Secondary | ICD-10-CM | POA: Diagnosis not present

## 2017-10-27 DIAGNOSIS — R41841 Cognitive communication deficit: Secondary | ICD-10-CM | POA: Diagnosis not present

## 2017-10-27 DIAGNOSIS — I129 Hypertensive chronic kidney disease with stage 1 through stage 4 chronic kidney disease, or unspecified chronic kidney disease: Secondary | ICD-10-CM | POA: Diagnosis not present

## 2017-10-27 DIAGNOSIS — N183 Chronic kidney disease, stage 3 (moderate): Secondary | ICD-10-CM | POA: Diagnosis not present

## 2017-10-27 DIAGNOSIS — G2581 Restless legs syndrome: Secondary | ICD-10-CM | POA: Diagnosis not present

## 2017-10-27 DIAGNOSIS — Z4781 Encounter for orthopedic aftercare following surgical amputation: Secondary | ICD-10-CM | POA: Diagnosis not present

## 2017-10-27 DIAGNOSIS — L97511 Non-pressure chronic ulcer of other part of right foot limited to breakdown of skin: Secondary | ICD-10-CM | POA: Diagnosis not present

## 2017-10-27 DIAGNOSIS — E11621 Type 2 diabetes mellitus with foot ulcer: Secondary | ICD-10-CM | POA: Diagnosis not present

## 2017-10-27 DIAGNOSIS — G40909 Epilepsy, unspecified, not intractable, without status epilepticus: Secondary | ICD-10-CM | POA: Diagnosis not present

## 2017-10-30 DIAGNOSIS — R41841 Cognitive communication deficit: Secondary | ICD-10-CM | POA: Diagnosis not present

## 2017-10-30 DIAGNOSIS — N183 Chronic kidney disease, stage 3 (moderate): Secondary | ICD-10-CM | POA: Diagnosis not present

## 2017-10-30 DIAGNOSIS — G2581 Restless legs syndrome: Secondary | ICD-10-CM | POA: Diagnosis not present

## 2017-10-30 DIAGNOSIS — E1122 Type 2 diabetes mellitus with diabetic chronic kidney disease: Secondary | ICD-10-CM | POA: Diagnosis not present

## 2017-10-30 DIAGNOSIS — L97511 Non-pressure chronic ulcer of other part of right foot limited to breakdown of skin: Secondary | ICD-10-CM | POA: Diagnosis not present

## 2017-10-30 DIAGNOSIS — Z4781 Encounter for orthopedic aftercare following surgical amputation: Secondary | ICD-10-CM | POA: Diagnosis not present

## 2017-10-30 DIAGNOSIS — G40909 Epilepsy, unspecified, not intractable, without status epilepticus: Secondary | ICD-10-CM | POA: Diagnosis not present

## 2017-10-30 DIAGNOSIS — E11621 Type 2 diabetes mellitus with foot ulcer: Secondary | ICD-10-CM | POA: Diagnosis not present

## 2017-10-30 DIAGNOSIS — I129 Hypertensive chronic kidney disease with stage 1 through stage 4 chronic kidney disease, or unspecified chronic kidney disease: Secondary | ICD-10-CM | POA: Diagnosis not present

## 2017-11-02 DIAGNOSIS — E11621 Type 2 diabetes mellitus with foot ulcer: Secondary | ICD-10-CM | POA: Diagnosis not present

## 2017-11-02 DIAGNOSIS — L97511 Non-pressure chronic ulcer of other part of right foot limited to breakdown of skin: Secondary | ICD-10-CM | POA: Diagnosis not present

## 2017-11-02 DIAGNOSIS — E1122 Type 2 diabetes mellitus with diabetic chronic kidney disease: Secondary | ICD-10-CM | POA: Diagnosis not present

## 2017-11-02 DIAGNOSIS — N183 Chronic kidney disease, stage 3 (moderate): Secondary | ICD-10-CM | POA: Diagnosis not present

## 2017-11-02 DIAGNOSIS — I129 Hypertensive chronic kidney disease with stage 1 through stage 4 chronic kidney disease, or unspecified chronic kidney disease: Secondary | ICD-10-CM | POA: Diagnosis not present

## 2017-11-02 DIAGNOSIS — G2581 Restless legs syndrome: Secondary | ICD-10-CM | POA: Diagnosis not present

## 2017-11-02 DIAGNOSIS — Z4781 Encounter for orthopedic aftercare following surgical amputation: Secondary | ICD-10-CM | POA: Diagnosis not present

## 2017-11-02 DIAGNOSIS — G40909 Epilepsy, unspecified, not intractable, without status epilepticus: Secondary | ICD-10-CM | POA: Diagnosis not present

## 2017-11-02 DIAGNOSIS — R41841 Cognitive communication deficit: Secondary | ICD-10-CM | POA: Diagnosis not present

## 2017-11-05 DIAGNOSIS — N183 Chronic kidney disease, stage 3 (moderate): Secondary | ICD-10-CM | POA: Diagnosis not present

## 2017-11-05 DIAGNOSIS — Z4781 Encounter for orthopedic aftercare following surgical amputation: Secondary | ICD-10-CM | POA: Diagnosis not present

## 2017-11-05 DIAGNOSIS — R41841 Cognitive communication deficit: Secondary | ICD-10-CM | POA: Diagnosis not present

## 2017-11-05 DIAGNOSIS — E11621 Type 2 diabetes mellitus with foot ulcer: Secondary | ICD-10-CM | POA: Diagnosis not present

## 2017-11-05 DIAGNOSIS — L97511 Non-pressure chronic ulcer of other part of right foot limited to breakdown of skin: Secondary | ICD-10-CM | POA: Diagnosis not present

## 2017-11-05 DIAGNOSIS — G40909 Epilepsy, unspecified, not intractable, without status epilepticus: Secondary | ICD-10-CM | POA: Diagnosis not present

## 2017-11-05 DIAGNOSIS — G2581 Restless legs syndrome: Secondary | ICD-10-CM | POA: Diagnosis not present

## 2017-11-05 DIAGNOSIS — E1122 Type 2 diabetes mellitus with diabetic chronic kidney disease: Secondary | ICD-10-CM | POA: Diagnosis not present

## 2017-11-05 DIAGNOSIS — I129 Hypertensive chronic kidney disease with stage 1 through stage 4 chronic kidney disease, or unspecified chronic kidney disease: Secondary | ICD-10-CM | POA: Diagnosis not present

## 2017-11-07 ENCOUNTER — Other Ambulatory Visit: Payer: Self-pay | Admitting: Physician Assistant

## 2017-11-09 DIAGNOSIS — L97511 Non-pressure chronic ulcer of other part of right foot limited to breakdown of skin: Secondary | ICD-10-CM | POA: Diagnosis not present

## 2017-11-09 DIAGNOSIS — N183 Chronic kidney disease, stage 3 (moderate): Secondary | ICD-10-CM | POA: Diagnosis not present

## 2017-11-09 DIAGNOSIS — I129 Hypertensive chronic kidney disease with stage 1 through stage 4 chronic kidney disease, or unspecified chronic kidney disease: Secondary | ICD-10-CM | POA: Diagnosis not present

## 2017-11-09 DIAGNOSIS — E11621 Type 2 diabetes mellitus with foot ulcer: Secondary | ICD-10-CM | POA: Diagnosis not present

## 2017-11-09 DIAGNOSIS — G40909 Epilepsy, unspecified, not intractable, without status epilepticus: Secondary | ICD-10-CM | POA: Diagnosis not present

## 2017-11-09 DIAGNOSIS — Z4781 Encounter for orthopedic aftercare following surgical amputation: Secondary | ICD-10-CM | POA: Diagnosis not present

## 2017-11-09 DIAGNOSIS — G2581 Restless legs syndrome: Secondary | ICD-10-CM | POA: Diagnosis not present

## 2017-11-09 DIAGNOSIS — E1122 Type 2 diabetes mellitus with diabetic chronic kidney disease: Secondary | ICD-10-CM | POA: Diagnosis not present

## 2017-11-09 DIAGNOSIS — R41841 Cognitive communication deficit: Secondary | ICD-10-CM | POA: Diagnosis not present

## 2017-11-13 DIAGNOSIS — E1122 Type 2 diabetes mellitus with diabetic chronic kidney disease: Secondary | ICD-10-CM | POA: Diagnosis not present

## 2017-11-13 DIAGNOSIS — N183 Chronic kidney disease, stage 3 (moderate): Secondary | ICD-10-CM | POA: Diagnosis not present

## 2017-11-13 DIAGNOSIS — Z4781 Encounter for orthopedic aftercare following surgical amputation: Secondary | ICD-10-CM | POA: Diagnosis not present

## 2017-11-13 DIAGNOSIS — E11621 Type 2 diabetes mellitus with foot ulcer: Secondary | ICD-10-CM | POA: Diagnosis not present

## 2017-11-13 DIAGNOSIS — L97511 Non-pressure chronic ulcer of other part of right foot limited to breakdown of skin: Secondary | ICD-10-CM | POA: Diagnosis not present

## 2017-11-13 DIAGNOSIS — G2581 Restless legs syndrome: Secondary | ICD-10-CM | POA: Diagnosis not present

## 2017-11-13 DIAGNOSIS — R41841 Cognitive communication deficit: Secondary | ICD-10-CM | POA: Diagnosis not present

## 2017-11-13 DIAGNOSIS — G40909 Epilepsy, unspecified, not intractable, without status epilepticus: Secondary | ICD-10-CM | POA: Diagnosis not present

## 2017-11-13 DIAGNOSIS — I129 Hypertensive chronic kidney disease with stage 1 through stage 4 chronic kidney disease, or unspecified chronic kidney disease: Secondary | ICD-10-CM | POA: Diagnosis not present

## 2017-11-17 DIAGNOSIS — E1122 Type 2 diabetes mellitus with diabetic chronic kidney disease: Secondary | ICD-10-CM | POA: Diagnosis not present

## 2017-11-17 DIAGNOSIS — I129 Hypertensive chronic kidney disease with stage 1 through stage 4 chronic kidney disease, or unspecified chronic kidney disease: Secondary | ICD-10-CM | POA: Diagnosis not present

## 2017-11-17 DIAGNOSIS — L97511 Non-pressure chronic ulcer of other part of right foot limited to breakdown of skin: Secondary | ICD-10-CM | POA: Diagnosis not present

## 2017-11-17 DIAGNOSIS — N183 Chronic kidney disease, stage 3 (moderate): Secondary | ICD-10-CM | POA: Diagnosis not present

## 2017-11-17 DIAGNOSIS — G2581 Restless legs syndrome: Secondary | ICD-10-CM | POA: Diagnosis not present

## 2017-11-17 DIAGNOSIS — Z4781 Encounter for orthopedic aftercare following surgical amputation: Secondary | ICD-10-CM | POA: Diagnosis not present

## 2017-11-17 DIAGNOSIS — E11621 Type 2 diabetes mellitus with foot ulcer: Secondary | ICD-10-CM | POA: Diagnosis not present

## 2017-11-17 DIAGNOSIS — G40909 Epilepsy, unspecified, not intractable, without status epilepticus: Secondary | ICD-10-CM | POA: Diagnosis not present

## 2017-11-17 DIAGNOSIS — R41841 Cognitive communication deficit: Secondary | ICD-10-CM | POA: Diagnosis not present

## 2017-11-20 DIAGNOSIS — G2581 Restless legs syndrome: Secondary | ICD-10-CM | POA: Diagnosis not present

## 2017-11-20 DIAGNOSIS — E1122 Type 2 diabetes mellitus with diabetic chronic kidney disease: Secondary | ICD-10-CM | POA: Diagnosis not present

## 2017-11-20 DIAGNOSIS — L97511 Non-pressure chronic ulcer of other part of right foot limited to breakdown of skin: Secondary | ICD-10-CM | POA: Diagnosis not present

## 2017-11-20 DIAGNOSIS — G40909 Epilepsy, unspecified, not intractable, without status epilepticus: Secondary | ICD-10-CM | POA: Diagnosis not present

## 2017-11-20 DIAGNOSIS — N183 Chronic kidney disease, stage 3 (moderate): Secondary | ICD-10-CM | POA: Diagnosis not present

## 2017-11-20 DIAGNOSIS — E11621 Type 2 diabetes mellitus with foot ulcer: Secondary | ICD-10-CM | POA: Diagnosis not present

## 2017-11-20 DIAGNOSIS — R41841 Cognitive communication deficit: Secondary | ICD-10-CM | POA: Diagnosis not present

## 2017-11-20 DIAGNOSIS — I129 Hypertensive chronic kidney disease with stage 1 through stage 4 chronic kidney disease, or unspecified chronic kidney disease: Secondary | ICD-10-CM | POA: Diagnosis not present

## 2017-11-20 DIAGNOSIS — Z4781 Encounter for orthopedic aftercare following surgical amputation: Secondary | ICD-10-CM | POA: Diagnosis not present

## 2017-11-23 DIAGNOSIS — N183 Chronic kidney disease, stage 3 (moderate): Secondary | ICD-10-CM | POA: Diagnosis not present

## 2017-11-23 DIAGNOSIS — R41841 Cognitive communication deficit: Secondary | ICD-10-CM | POA: Diagnosis not present

## 2017-11-23 DIAGNOSIS — I129 Hypertensive chronic kidney disease with stage 1 through stage 4 chronic kidney disease, or unspecified chronic kidney disease: Secondary | ICD-10-CM | POA: Diagnosis not present

## 2017-11-23 DIAGNOSIS — E1122 Type 2 diabetes mellitus with diabetic chronic kidney disease: Secondary | ICD-10-CM | POA: Diagnosis not present

## 2017-11-23 DIAGNOSIS — G2581 Restless legs syndrome: Secondary | ICD-10-CM | POA: Diagnosis not present

## 2017-11-23 DIAGNOSIS — G40909 Epilepsy, unspecified, not intractable, without status epilepticus: Secondary | ICD-10-CM | POA: Diagnosis not present

## 2017-11-23 DIAGNOSIS — L97511 Non-pressure chronic ulcer of other part of right foot limited to breakdown of skin: Secondary | ICD-10-CM | POA: Diagnosis not present

## 2017-11-23 DIAGNOSIS — E11621 Type 2 diabetes mellitus with foot ulcer: Secondary | ICD-10-CM | POA: Diagnosis not present

## 2017-11-23 DIAGNOSIS — Z4781 Encounter for orthopedic aftercare following surgical amputation: Secondary | ICD-10-CM | POA: Diagnosis not present

## 2017-11-24 DIAGNOSIS — I890 Lymphedema, not elsewhere classified: Secondary | ICD-10-CM | POA: Diagnosis not present

## 2017-11-24 DIAGNOSIS — L97812 Non-pressure chronic ulcer of other part of right lower leg with fat layer exposed: Secondary | ICD-10-CM | POA: Diagnosis not present

## 2017-11-24 DIAGNOSIS — L97522 Non-pressure chronic ulcer of other part of left foot with fat layer exposed: Secondary | ICD-10-CM | POA: Diagnosis not present

## 2017-11-25 DIAGNOSIS — L309 Dermatitis, unspecified: Secondary | ICD-10-CM | POA: Diagnosis not present

## 2017-11-25 DIAGNOSIS — R296 Repeated falls: Secondary | ICD-10-CM | POA: Diagnosis not present

## 2017-11-25 DIAGNOSIS — S0512XA Contusion of eyeball and orbital tissues, left eye, initial encounter: Secondary | ICD-10-CM | POA: Diagnosis not present

## 2017-11-27 DIAGNOSIS — E11621 Type 2 diabetes mellitus with foot ulcer: Secondary | ICD-10-CM | POA: Diagnosis not present

## 2017-11-27 DIAGNOSIS — E1122 Type 2 diabetes mellitus with diabetic chronic kidney disease: Secondary | ICD-10-CM | POA: Diagnosis not present

## 2017-11-27 DIAGNOSIS — I129 Hypertensive chronic kidney disease with stage 1 through stage 4 chronic kidney disease, or unspecified chronic kidney disease: Secondary | ICD-10-CM | POA: Diagnosis not present

## 2017-11-27 DIAGNOSIS — N183 Chronic kidney disease, stage 3 (moderate): Secondary | ICD-10-CM | POA: Diagnosis not present

## 2017-11-27 DIAGNOSIS — Z4781 Encounter for orthopedic aftercare following surgical amputation: Secondary | ICD-10-CM | POA: Diagnosis not present

## 2017-11-27 DIAGNOSIS — G40909 Epilepsy, unspecified, not intractable, without status epilepticus: Secondary | ICD-10-CM | POA: Diagnosis not present

## 2017-11-27 DIAGNOSIS — G2581 Restless legs syndrome: Secondary | ICD-10-CM | POA: Diagnosis not present

## 2017-11-27 DIAGNOSIS — L97511 Non-pressure chronic ulcer of other part of right foot limited to breakdown of skin: Secondary | ICD-10-CM | POA: Diagnosis not present

## 2017-11-27 DIAGNOSIS — R41841 Cognitive communication deficit: Secondary | ICD-10-CM | POA: Diagnosis not present

## 2017-11-30 DIAGNOSIS — M6281 Muscle weakness (generalized): Secondary | ICD-10-CM | POA: Diagnosis not present

## 2017-11-30 DIAGNOSIS — R262 Difficulty in walking, not elsewhere classified: Secondary | ICD-10-CM | POA: Diagnosis not present

## 2017-11-30 DIAGNOSIS — L309 Dermatitis, unspecified: Secondary | ICD-10-CM | POA: Diagnosis not present

## 2017-11-30 DIAGNOSIS — R278 Other lack of coordination: Secondary | ICD-10-CM | POA: Diagnosis not present

## 2017-11-30 DIAGNOSIS — R2681 Unsteadiness on feet: Secondary | ICD-10-CM | POA: Diagnosis not present

## 2017-12-01 DIAGNOSIS — R278 Other lack of coordination: Secondary | ICD-10-CM | POA: Diagnosis not present

## 2017-12-01 DIAGNOSIS — M6281 Muscle weakness (generalized): Secondary | ICD-10-CM | POA: Diagnosis not present

## 2017-12-01 DIAGNOSIS — R262 Difficulty in walking, not elsewhere classified: Secondary | ICD-10-CM | POA: Diagnosis not present

## 2017-12-01 DIAGNOSIS — E11621 Type 2 diabetes mellitus with foot ulcer: Secondary | ICD-10-CM | POA: Diagnosis not present

## 2017-12-01 DIAGNOSIS — L97511 Non-pressure chronic ulcer of other part of right foot limited to breakdown of skin: Secondary | ICD-10-CM | POA: Diagnosis not present

## 2017-12-01 DIAGNOSIS — G2581 Restless legs syndrome: Secondary | ICD-10-CM | POA: Diagnosis not present

## 2017-12-01 DIAGNOSIS — G40909 Epilepsy, unspecified, not intractable, without status epilepticus: Secondary | ICD-10-CM | POA: Diagnosis not present

## 2017-12-01 DIAGNOSIS — Z4781 Encounter for orthopedic aftercare following surgical amputation: Secondary | ICD-10-CM | POA: Diagnosis not present

## 2017-12-01 DIAGNOSIS — R41841 Cognitive communication deficit: Secondary | ICD-10-CM | POA: Diagnosis not present

## 2017-12-01 DIAGNOSIS — I129 Hypertensive chronic kidney disease with stage 1 through stage 4 chronic kidney disease, or unspecified chronic kidney disease: Secondary | ICD-10-CM | POA: Diagnosis not present

## 2017-12-01 DIAGNOSIS — E1122 Type 2 diabetes mellitus with diabetic chronic kidney disease: Secondary | ICD-10-CM | POA: Diagnosis not present

## 2017-12-01 DIAGNOSIS — N183 Chronic kidney disease, stage 3 (moderate): Secondary | ICD-10-CM | POA: Diagnosis not present

## 2017-12-01 DIAGNOSIS — R2681 Unsteadiness on feet: Secondary | ICD-10-CM | POA: Diagnosis not present

## 2017-12-03 DIAGNOSIS — R278 Other lack of coordination: Secondary | ICD-10-CM | POA: Diagnosis not present

## 2017-12-03 DIAGNOSIS — R262 Difficulty in walking, not elsewhere classified: Secondary | ICD-10-CM | POA: Diagnosis not present

## 2017-12-03 DIAGNOSIS — R2681 Unsteadiness on feet: Secondary | ICD-10-CM | POA: Diagnosis not present

## 2017-12-03 DIAGNOSIS — M6281 Muscle weakness (generalized): Secondary | ICD-10-CM | POA: Diagnosis not present

## 2017-12-07 DIAGNOSIS — M6281 Muscle weakness (generalized): Secondary | ICD-10-CM | POA: Diagnosis not present

## 2017-12-07 DIAGNOSIS — R2681 Unsteadiness on feet: Secondary | ICD-10-CM | POA: Diagnosis not present

## 2017-12-07 DIAGNOSIS — R278 Other lack of coordination: Secondary | ICD-10-CM | POA: Diagnosis not present

## 2017-12-07 DIAGNOSIS — R262 Difficulty in walking, not elsewhere classified: Secondary | ICD-10-CM | POA: Diagnosis not present

## 2017-12-08 DIAGNOSIS — L97522 Non-pressure chronic ulcer of other part of left foot with fat layer exposed: Secondary | ICD-10-CM | POA: Diagnosis not present

## 2017-12-08 DIAGNOSIS — L303 Infective dermatitis: Secondary | ICD-10-CM | POA: Diagnosis not present

## 2017-12-08 DIAGNOSIS — E1151 Type 2 diabetes mellitus with diabetic peripheral angiopathy without gangrene: Secondary | ICD-10-CM | POA: Diagnosis not present

## 2017-12-08 DIAGNOSIS — L84 Corns and callosities: Secondary | ICD-10-CM | POA: Diagnosis not present

## 2017-12-08 DIAGNOSIS — I890 Lymphedema, not elsewhere classified: Secondary | ICD-10-CM | POA: Diagnosis not present

## 2017-12-08 DIAGNOSIS — I739 Peripheral vascular disease, unspecified: Secondary | ICD-10-CM | POA: Diagnosis not present

## 2017-12-11 DIAGNOSIS — G2581 Restless legs syndrome: Secondary | ICD-10-CM | POA: Diagnosis not present

## 2017-12-11 DIAGNOSIS — L97511 Non-pressure chronic ulcer of other part of right foot limited to breakdown of skin: Secondary | ICD-10-CM | POA: Diagnosis not present

## 2017-12-11 DIAGNOSIS — G40909 Epilepsy, unspecified, not intractable, without status epilepticus: Secondary | ICD-10-CM | POA: Diagnosis not present

## 2017-12-11 DIAGNOSIS — Z4781 Encounter for orthopedic aftercare following surgical amputation: Secondary | ICD-10-CM | POA: Diagnosis not present

## 2017-12-11 DIAGNOSIS — E1122 Type 2 diabetes mellitus with diabetic chronic kidney disease: Secondary | ICD-10-CM | POA: Diagnosis not present

## 2017-12-11 DIAGNOSIS — I129 Hypertensive chronic kidney disease with stage 1 through stage 4 chronic kidney disease, or unspecified chronic kidney disease: Secondary | ICD-10-CM | POA: Diagnosis not present

## 2017-12-11 DIAGNOSIS — R41841 Cognitive communication deficit: Secondary | ICD-10-CM | POA: Diagnosis not present

## 2017-12-11 DIAGNOSIS — E11621 Type 2 diabetes mellitus with foot ulcer: Secondary | ICD-10-CM | POA: Diagnosis not present

## 2017-12-11 DIAGNOSIS — N183 Chronic kidney disease, stage 3 (moderate): Secondary | ICD-10-CM | POA: Diagnosis not present

## 2017-12-14 DIAGNOSIS — I129 Hypertensive chronic kidney disease with stage 1 through stage 4 chronic kidney disease, or unspecified chronic kidney disease: Secondary | ICD-10-CM | POA: Diagnosis not present

## 2017-12-14 DIAGNOSIS — N183 Chronic kidney disease, stage 3 (moderate): Secondary | ICD-10-CM | POA: Diagnosis not present

## 2017-12-14 DIAGNOSIS — R41841 Cognitive communication deficit: Secondary | ICD-10-CM | POA: Diagnosis not present

## 2017-12-14 DIAGNOSIS — Z4781 Encounter for orthopedic aftercare following surgical amputation: Secondary | ICD-10-CM | POA: Diagnosis not present

## 2017-12-14 DIAGNOSIS — G40909 Epilepsy, unspecified, not intractable, without status epilepticus: Secondary | ICD-10-CM | POA: Diagnosis not present

## 2017-12-14 DIAGNOSIS — E1122 Type 2 diabetes mellitus with diabetic chronic kidney disease: Secondary | ICD-10-CM | POA: Diagnosis not present

## 2017-12-14 DIAGNOSIS — G2581 Restless legs syndrome: Secondary | ICD-10-CM | POA: Diagnosis not present

## 2017-12-14 DIAGNOSIS — E11621 Type 2 diabetes mellitus with foot ulcer: Secondary | ICD-10-CM | POA: Diagnosis not present

## 2017-12-14 DIAGNOSIS — L97511 Non-pressure chronic ulcer of other part of right foot limited to breakdown of skin: Secondary | ICD-10-CM | POA: Diagnosis not present

## 2017-12-15 DIAGNOSIS — R278 Other lack of coordination: Secondary | ICD-10-CM | POA: Diagnosis not present

## 2017-12-15 DIAGNOSIS — M6281 Muscle weakness (generalized): Secondary | ICD-10-CM | POA: Diagnosis not present

## 2017-12-15 DIAGNOSIS — R2681 Unsteadiness on feet: Secondary | ICD-10-CM | POA: Diagnosis not present

## 2017-12-15 DIAGNOSIS — R262 Difficulty in walking, not elsewhere classified: Secondary | ICD-10-CM | POA: Diagnosis not present

## 2017-12-17 DIAGNOSIS — L97511 Non-pressure chronic ulcer of other part of right foot limited to breakdown of skin: Secondary | ICD-10-CM | POA: Diagnosis not present

## 2017-12-17 DIAGNOSIS — N183 Chronic kidney disease, stage 3 (moderate): Secondary | ICD-10-CM | POA: Diagnosis not present

## 2017-12-17 DIAGNOSIS — E1122 Type 2 diabetes mellitus with diabetic chronic kidney disease: Secondary | ICD-10-CM | POA: Diagnosis not present

## 2017-12-17 DIAGNOSIS — G40909 Epilepsy, unspecified, not intractable, without status epilepticus: Secondary | ICD-10-CM | POA: Diagnosis not present

## 2017-12-17 DIAGNOSIS — Z4781 Encounter for orthopedic aftercare following surgical amputation: Secondary | ICD-10-CM | POA: Diagnosis not present

## 2017-12-17 DIAGNOSIS — I129 Hypertensive chronic kidney disease with stage 1 through stage 4 chronic kidney disease, or unspecified chronic kidney disease: Secondary | ICD-10-CM | POA: Diagnosis not present

## 2017-12-17 DIAGNOSIS — E11621 Type 2 diabetes mellitus with foot ulcer: Secondary | ICD-10-CM | POA: Diagnosis not present

## 2017-12-17 DIAGNOSIS — R41841 Cognitive communication deficit: Secondary | ICD-10-CM | POA: Diagnosis not present

## 2017-12-17 DIAGNOSIS — G2581 Restless legs syndrome: Secondary | ICD-10-CM | POA: Diagnosis not present

## 2017-12-19 DIAGNOSIS — R410 Disorientation, unspecified: Secondary | ICD-10-CM | POA: Diagnosis not present

## 2017-12-19 DIAGNOSIS — R443 Hallucinations, unspecified: Secondary | ICD-10-CM | POA: Diagnosis not present

## 2017-12-19 DIAGNOSIS — R809 Proteinuria, unspecified: Secondary | ICD-10-CM | POA: Diagnosis not present

## 2017-12-21 DIAGNOSIS — Z4781 Encounter for orthopedic aftercare following surgical amputation: Secondary | ICD-10-CM | POA: Diagnosis not present

## 2017-12-21 DIAGNOSIS — E11621 Type 2 diabetes mellitus with foot ulcer: Secondary | ICD-10-CM | POA: Diagnosis not present

## 2017-12-21 DIAGNOSIS — L97511 Non-pressure chronic ulcer of other part of right foot limited to breakdown of skin: Secondary | ICD-10-CM | POA: Diagnosis not present

## 2017-12-21 DIAGNOSIS — G2581 Restless legs syndrome: Secondary | ICD-10-CM | POA: Diagnosis not present

## 2017-12-21 DIAGNOSIS — G40909 Epilepsy, unspecified, not intractable, without status epilepticus: Secondary | ICD-10-CM | POA: Diagnosis not present

## 2017-12-21 DIAGNOSIS — E1122 Type 2 diabetes mellitus with diabetic chronic kidney disease: Secondary | ICD-10-CM | POA: Diagnosis not present

## 2017-12-21 DIAGNOSIS — R41841 Cognitive communication deficit: Secondary | ICD-10-CM | POA: Diagnosis not present

## 2017-12-21 DIAGNOSIS — I129 Hypertensive chronic kidney disease with stage 1 through stage 4 chronic kidney disease, or unspecified chronic kidney disease: Secondary | ICD-10-CM | POA: Diagnosis not present

## 2017-12-21 DIAGNOSIS — N183 Chronic kidney disease, stage 3 (moderate): Secondary | ICD-10-CM | POA: Diagnosis not present

## 2017-12-22 DIAGNOSIS — R2681 Unsteadiness on feet: Secondary | ICD-10-CM | POA: Diagnosis not present

## 2017-12-22 DIAGNOSIS — M6281 Muscle weakness (generalized): Secondary | ICD-10-CM | POA: Diagnosis not present

## 2017-12-22 DIAGNOSIS — R278 Other lack of coordination: Secondary | ICD-10-CM | POA: Diagnosis not present

## 2017-12-22 DIAGNOSIS — R262 Difficulty in walking, not elsewhere classified: Secondary | ICD-10-CM | POA: Diagnosis not present

## 2017-12-28 DIAGNOSIS — L309 Dermatitis, unspecified: Secondary | ICD-10-CM | POA: Diagnosis not present

## 2017-12-29 DIAGNOSIS — L97529 Non-pressure chronic ulcer of other part of left foot with unspecified severity: Secondary | ICD-10-CM | POA: Diagnosis not present

## 2017-12-29 DIAGNOSIS — L97522 Non-pressure chronic ulcer of other part of left foot with fat layer exposed: Secondary | ICD-10-CM | POA: Diagnosis not present

## 2017-12-29 DIAGNOSIS — L97512 Non-pressure chronic ulcer of other part of right foot with fat layer exposed: Secondary | ICD-10-CM | POA: Diagnosis not present

## 2017-12-29 DIAGNOSIS — E1142 Type 2 diabetes mellitus with diabetic polyneuropathy: Secondary | ICD-10-CM | POA: Diagnosis not present

## 2017-12-29 DIAGNOSIS — E1122 Type 2 diabetes mellitus with diabetic chronic kidney disease: Secondary | ICD-10-CM | POA: Diagnosis not present

## 2017-12-29 DIAGNOSIS — E1151 Type 2 diabetes mellitus with diabetic peripheral angiopathy without gangrene: Secondary | ICD-10-CM | POA: Diagnosis not present

## 2017-12-29 DIAGNOSIS — E11621 Type 2 diabetes mellitus with foot ulcer: Secondary | ICD-10-CM | POA: Diagnosis not present

## 2017-12-30 DIAGNOSIS — E1142 Type 2 diabetes mellitus with diabetic polyneuropathy: Secondary | ICD-10-CM | POA: Diagnosis not present

## 2017-12-30 DIAGNOSIS — E1151 Type 2 diabetes mellitus with diabetic peripheral angiopathy without gangrene: Secondary | ICD-10-CM | POA: Diagnosis not present

## 2017-12-30 DIAGNOSIS — L97529 Non-pressure chronic ulcer of other part of left foot with unspecified severity: Secondary | ICD-10-CM | POA: Diagnosis not present

## 2017-12-30 DIAGNOSIS — L97512 Non-pressure chronic ulcer of other part of right foot with fat layer exposed: Secondary | ICD-10-CM | POA: Diagnosis not present

## 2017-12-30 DIAGNOSIS — E11621 Type 2 diabetes mellitus with foot ulcer: Secondary | ICD-10-CM | POA: Diagnosis not present

## 2017-12-30 DIAGNOSIS — E1122 Type 2 diabetes mellitus with diabetic chronic kidney disease: Secondary | ICD-10-CM | POA: Diagnosis not present

## 2017-12-31 DIAGNOSIS — E1122 Type 2 diabetes mellitus with diabetic chronic kidney disease: Secondary | ICD-10-CM | POA: Diagnosis not present

## 2017-12-31 DIAGNOSIS — L97512 Non-pressure chronic ulcer of other part of right foot with fat layer exposed: Secondary | ICD-10-CM | POA: Diagnosis not present

## 2017-12-31 DIAGNOSIS — E11621 Type 2 diabetes mellitus with foot ulcer: Secondary | ICD-10-CM | POA: Diagnosis not present

## 2017-12-31 DIAGNOSIS — E1142 Type 2 diabetes mellitus with diabetic polyneuropathy: Secondary | ICD-10-CM | POA: Diagnosis not present

## 2017-12-31 DIAGNOSIS — E1151 Type 2 diabetes mellitus with diabetic peripheral angiopathy without gangrene: Secondary | ICD-10-CM | POA: Diagnosis not present

## 2017-12-31 DIAGNOSIS — L97529 Non-pressure chronic ulcer of other part of left foot with unspecified severity: Secondary | ICD-10-CM | POA: Diagnosis not present

## 2018-01-01 DIAGNOSIS — E11621 Type 2 diabetes mellitus with foot ulcer: Secondary | ICD-10-CM | POA: Diagnosis not present

## 2018-01-01 DIAGNOSIS — L97529 Non-pressure chronic ulcer of other part of left foot with unspecified severity: Secondary | ICD-10-CM | POA: Diagnosis not present

## 2018-01-01 DIAGNOSIS — E1122 Type 2 diabetes mellitus with diabetic chronic kidney disease: Secondary | ICD-10-CM | POA: Diagnosis not present

## 2018-01-01 DIAGNOSIS — E1151 Type 2 diabetes mellitus with diabetic peripheral angiopathy without gangrene: Secondary | ICD-10-CM | POA: Diagnosis not present

## 2018-01-01 DIAGNOSIS — L97512 Non-pressure chronic ulcer of other part of right foot with fat layer exposed: Secondary | ICD-10-CM | POA: Diagnosis not present

## 2018-01-01 DIAGNOSIS — E1142 Type 2 diabetes mellitus with diabetic polyneuropathy: Secondary | ICD-10-CM | POA: Diagnosis not present

## 2018-01-02 DIAGNOSIS — L97512 Non-pressure chronic ulcer of other part of right foot with fat layer exposed: Secondary | ICD-10-CM | POA: Diagnosis not present

## 2018-01-02 DIAGNOSIS — E11621 Type 2 diabetes mellitus with foot ulcer: Secondary | ICD-10-CM | POA: Diagnosis not present

## 2018-01-02 DIAGNOSIS — L97529 Non-pressure chronic ulcer of other part of left foot with unspecified severity: Secondary | ICD-10-CM | POA: Diagnosis not present

## 2018-01-02 DIAGNOSIS — E1122 Type 2 diabetes mellitus with diabetic chronic kidney disease: Secondary | ICD-10-CM | POA: Diagnosis not present

## 2018-01-02 DIAGNOSIS — E1142 Type 2 diabetes mellitus with diabetic polyneuropathy: Secondary | ICD-10-CM | POA: Diagnosis not present

## 2018-01-02 DIAGNOSIS — E1151 Type 2 diabetes mellitus with diabetic peripheral angiopathy without gangrene: Secondary | ICD-10-CM | POA: Diagnosis not present

## 2018-01-03 DIAGNOSIS — E1142 Type 2 diabetes mellitus with diabetic polyneuropathy: Secondary | ICD-10-CM | POA: Diagnosis not present

## 2018-01-03 DIAGNOSIS — L97529 Non-pressure chronic ulcer of other part of left foot with unspecified severity: Secondary | ICD-10-CM | POA: Diagnosis not present

## 2018-01-03 DIAGNOSIS — E1122 Type 2 diabetes mellitus with diabetic chronic kidney disease: Secondary | ICD-10-CM | POA: Diagnosis not present

## 2018-01-03 DIAGNOSIS — E11621 Type 2 diabetes mellitus with foot ulcer: Secondary | ICD-10-CM | POA: Diagnosis not present

## 2018-01-03 DIAGNOSIS — E1151 Type 2 diabetes mellitus with diabetic peripheral angiopathy without gangrene: Secondary | ICD-10-CM | POA: Diagnosis not present

## 2018-01-03 DIAGNOSIS — L97512 Non-pressure chronic ulcer of other part of right foot with fat layer exposed: Secondary | ICD-10-CM | POA: Diagnosis not present

## 2018-01-04 ENCOUNTER — Telehealth (INDEPENDENT_AMBULATORY_CARE_PROVIDER_SITE_OTHER): Payer: Self-pay | Admitting: Orthopedic Surgery

## 2018-01-04 DIAGNOSIS — E1122 Type 2 diabetes mellitus with diabetic chronic kidney disease: Secondary | ICD-10-CM | POA: Diagnosis not present

## 2018-01-04 DIAGNOSIS — E1151 Type 2 diabetes mellitus with diabetic peripheral angiopathy without gangrene: Secondary | ICD-10-CM | POA: Diagnosis not present

## 2018-01-04 DIAGNOSIS — L97512 Non-pressure chronic ulcer of other part of right foot with fat layer exposed: Secondary | ICD-10-CM | POA: Diagnosis not present

## 2018-01-04 DIAGNOSIS — E11621 Type 2 diabetes mellitus with foot ulcer: Secondary | ICD-10-CM | POA: Diagnosis not present

## 2018-01-04 DIAGNOSIS — L97529 Non-pressure chronic ulcer of other part of left foot with unspecified severity: Secondary | ICD-10-CM | POA: Diagnosis not present

## 2018-01-04 DIAGNOSIS — E1142 Type 2 diabetes mellitus with diabetic polyneuropathy: Secondary | ICD-10-CM | POA: Diagnosis not present

## 2018-01-04 NOTE — Telephone Encounter (Signed)
Levada Dy RN, clinical coordinator with [facility] needs wound care orders to change to hydrogel gauze for 3 x a week.  It is for the right 4th toe area. Angelas call back [phone]

## 2018-01-04 NOTE — Telephone Encounter (Signed)
Called and sw [facility] RN to advise ok to order but the pt has not been seen in the office since January for a December toe amputation he needs to follow up in the office ASAP

## 2018-01-05 DIAGNOSIS — E1142 Type 2 diabetes mellitus with diabetic polyneuropathy: Secondary | ICD-10-CM | POA: Diagnosis not present

## 2018-01-05 DIAGNOSIS — E1151 Type 2 diabetes mellitus with diabetic peripheral angiopathy without gangrene: Secondary | ICD-10-CM | POA: Diagnosis not present

## 2018-01-05 DIAGNOSIS — L97512 Non-pressure chronic ulcer of other part of right foot with fat layer exposed: Secondary | ICD-10-CM | POA: Diagnosis not present

## 2018-01-05 DIAGNOSIS — E1122 Type 2 diabetes mellitus with diabetic chronic kidney disease: Secondary | ICD-10-CM | POA: Diagnosis not present

## 2018-01-05 DIAGNOSIS — E11621 Type 2 diabetes mellitus with foot ulcer: Secondary | ICD-10-CM | POA: Diagnosis not present

## 2018-01-05 DIAGNOSIS — L97529 Non-pressure chronic ulcer of other part of left foot with unspecified severity: Secondary | ICD-10-CM | POA: Diagnosis not present

## 2018-01-06 DIAGNOSIS — E11621 Type 2 diabetes mellitus with foot ulcer: Secondary | ICD-10-CM | POA: Diagnosis not present

## 2018-01-06 DIAGNOSIS — L97512 Non-pressure chronic ulcer of other part of right foot with fat layer exposed: Secondary | ICD-10-CM | POA: Diagnosis not present

## 2018-01-06 DIAGNOSIS — L97529 Non-pressure chronic ulcer of other part of left foot with unspecified severity: Secondary | ICD-10-CM | POA: Diagnosis not present

## 2018-01-06 DIAGNOSIS — E1122 Type 2 diabetes mellitus with diabetic chronic kidney disease: Secondary | ICD-10-CM | POA: Diagnosis not present

## 2018-01-06 DIAGNOSIS — E1151 Type 2 diabetes mellitus with diabetic peripheral angiopathy without gangrene: Secondary | ICD-10-CM | POA: Diagnosis not present

## 2018-01-06 DIAGNOSIS — E1142 Type 2 diabetes mellitus with diabetic polyneuropathy: Secondary | ICD-10-CM | POA: Diagnosis not present

## 2018-01-07 DIAGNOSIS — E1122 Type 2 diabetes mellitus with diabetic chronic kidney disease: Secondary | ICD-10-CM | POA: Diagnosis not present

## 2018-01-07 DIAGNOSIS — L97529 Non-pressure chronic ulcer of other part of left foot with unspecified severity: Secondary | ICD-10-CM | POA: Diagnosis not present

## 2018-01-07 DIAGNOSIS — E1142 Type 2 diabetes mellitus with diabetic polyneuropathy: Secondary | ICD-10-CM | POA: Diagnosis not present

## 2018-01-07 DIAGNOSIS — E11621 Type 2 diabetes mellitus with foot ulcer: Secondary | ICD-10-CM | POA: Diagnosis not present

## 2018-01-07 DIAGNOSIS — E1151 Type 2 diabetes mellitus with diabetic peripheral angiopathy without gangrene: Secondary | ICD-10-CM | POA: Diagnosis not present

## 2018-01-07 DIAGNOSIS — L97512 Non-pressure chronic ulcer of other part of right foot with fat layer exposed: Secondary | ICD-10-CM | POA: Diagnosis not present

## 2018-01-08 DIAGNOSIS — L853 Xerosis cutis: Secondary | ICD-10-CM | POA: Diagnosis not present

## 2018-01-08 DIAGNOSIS — L989 Disorder of the skin and subcutaneous tissue, unspecified: Secondary | ICD-10-CM | POA: Diagnosis not present

## 2018-01-08 DIAGNOSIS — E11621 Type 2 diabetes mellitus with foot ulcer: Secondary | ICD-10-CM | POA: Diagnosis not present

## 2018-01-08 DIAGNOSIS — L97512 Non-pressure chronic ulcer of other part of right foot with fat layer exposed: Secondary | ICD-10-CM | POA: Diagnosis not present

## 2018-01-08 DIAGNOSIS — L309 Dermatitis, unspecified: Secondary | ICD-10-CM | POA: Diagnosis not present

## 2018-01-08 DIAGNOSIS — L97529 Non-pressure chronic ulcer of other part of left foot with unspecified severity: Secondary | ICD-10-CM | POA: Diagnosis not present

## 2018-01-08 DIAGNOSIS — E1151 Type 2 diabetes mellitus with diabetic peripheral angiopathy without gangrene: Secondary | ICD-10-CM | POA: Diagnosis not present

## 2018-01-08 DIAGNOSIS — E1122 Type 2 diabetes mellitus with diabetic chronic kidney disease: Secondary | ICD-10-CM | POA: Diagnosis not present

## 2018-01-08 DIAGNOSIS — E1142 Type 2 diabetes mellitus with diabetic polyneuropathy: Secondary | ICD-10-CM | POA: Diagnosis not present

## 2018-01-09 DIAGNOSIS — E11621 Type 2 diabetes mellitus with foot ulcer: Secondary | ICD-10-CM | POA: Diagnosis not present

## 2018-01-09 DIAGNOSIS — E1142 Type 2 diabetes mellitus with diabetic polyneuropathy: Secondary | ICD-10-CM | POA: Diagnosis not present

## 2018-01-09 DIAGNOSIS — L97529 Non-pressure chronic ulcer of other part of left foot with unspecified severity: Secondary | ICD-10-CM | POA: Diagnosis not present

## 2018-01-09 DIAGNOSIS — E1151 Type 2 diabetes mellitus with diabetic peripheral angiopathy without gangrene: Secondary | ICD-10-CM | POA: Diagnosis not present

## 2018-01-09 DIAGNOSIS — L97512 Non-pressure chronic ulcer of other part of right foot with fat layer exposed: Secondary | ICD-10-CM | POA: Diagnosis not present

## 2018-01-09 DIAGNOSIS — E1122 Type 2 diabetes mellitus with diabetic chronic kidney disease: Secondary | ICD-10-CM | POA: Diagnosis not present

## 2018-01-10 DIAGNOSIS — L97512 Non-pressure chronic ulcer of other part of right foot with fat layer exposed: Secondary | ICD-10-CM | POA: Diagnosis not present

## 2018-01-10 DIAGNOSIS — E1142 Type 2 diabetes mellitus with diabetic polyneuropathy: Secondary | ICD-10-CM | POA: Diagnosis not present

## 2018-01-10 DIAGNOSIS — E11621 Type 2 diabetes mellitus with foot ulcer: Secondary | ICD-10-CM | POA: Diagnosis not present

## 2018-01-10 DIAGNOSIS — E1151 Type 2 diabetes mellitus with diabetic peripheral angiopathy without gangrene: Secondary | ICD-10-CM | POA: Diagnosis not present

## 2018-01-10 DIAGNOSIS — E1122 Type 2 diabetes mellitus with diabetic chronic kidney disease: Secondary | ICD-10-CM | POA: Diagnosis not present

## 2018-01-10 DIAGNOSIS — L97529 Non-pressure chronic ulcer of other part of left foot with unspecified severity: Secondary | ICD-10-CM | POA: Diagnosis not present

## 2018-01-11 DIAGNOSIS — E1151 Type 2 diabetes mellitus with diabetic peripheral angiopathy without gangrene: Secondary | ICD-10-CM | POA: Diagnosis not present

## 2018-01-11 DIAGNOSIS — E11621 Type 2 diabetes mellitus with foot ulcer: Secondary | ICD-10-CM | POA: Diagnosis not present

## 2018-01-11 DIAGNOSIS — E1142 Type 2 diabetes mellitus with diabetic polyneuropathy: Secondary | ICD-10-CM | POA: Diagnosis not present

## 2018-01-11 DIAGNOSIS — L97512 Non-pressure chronic ulcer of other part of right foot with fat layer exposed: Secondary | ICD-10-CM | POA: Diagnosis not present

## 2018-01-11 DIAGNOSIS — E1122 Type 2 diabetes mellitus with diabetic chronic kidney disease: Secondary | ICD-10-CM | POA: Diagnosis not present

## 2018-01-11 DIAGNOSIS — L97529 Non-pressure chronic ulcer of other part of left foot with unspecified severity: Secondary | ICD-10-CM | POA: Diagnosis not present

## 2018-01-12 DIAGNOSIS — L309 Dermatitis, unspecified: Secondary | ICD-10-CM | POA: Diagnosis not present

## 2018-01-12 DIAGNOSIS — E1151 Type 2 diabetes mellitus with diabetic peripheral angiopathy without gangrene: Secondary | ICD-10-CM | POA: Diagnosis not present

## 2018-01-12 DIAGNOSIS — L97512 Non-pressure chronic ulcer of other part of right foot with fat layer exposed: Secondary | ICD-10-CM | POA: Diagnosis not present

## 2018-01-12 DIAGNOSIS — I129 Hypertensive chronic kidney disease with stage 1 through stage 4 chronic kidney disease, or unspecified chronic kidney disease: Secondary | ICD-10-CM | POA: Diagnosis not present

## 2018-01-12 DIAGNOSIS — R946 Abnormal results of thyroid function studies: Secondary | ICD-10-CM | POA: Diagnosis not present

## 2018-01-12 DIAGNOSIS — E1142 Type 2 diabetes mellitus with diabetic polyneuropathy: Secondary | ICD-10-CM | POA: Diagnosis not present

## 2018-01-12 DIAGNOSIS — G629 Polyneuropathy, unspecified: Secondary | ICD-10-CM | POA: Diagnosis not present

## 2018-01-12 DIAGNOSIS — E1122 Type 2 diabetes mellitus with diabetic chronic kidney disease: Secondary | ICD-10-CM | POA: Diagnosis not present

## 2018-01-12 DIAGNOSIS — L97529 Non-pressure chronic ulcer of other part of left foot with unspecified severity: Secondary | ICD-10-CM | POA: Diagnosis not present

## 2018-01-12 DIAGNOSIS — E11621 Type 2 diabetes mellitus with foot ulcer: Secondary | ICD-10-CM | POA: Diagnosis not present

## 2018-01-13 DIAGNOSIS — E1142 Type 2 diabetes mellitus with diabetic polyneuropathy: Secondary | ICD-10-CM | POA: Diagnosis not present

## 2018-01-13 DIAGNOSIS — E1122 Type 2 diabetes mellitus with diabetic chronic kidney disease: Secondary | ICD-10-CM | POA: Diagnosis not present

## 2018-01-13 DIAGNOSIS — L97512 Non-pressure chronic ulcer of other part of right foot with fat layer exposed: Secondary | ICD-10-CM | POA: Diagnosis not present

## 2018-01-13 DIAGNOSIS — L97529 Non-pressure chronic ulcer of other part of left foot with unspecified severity: Secondary | ICD-10-CM | POA: Diagnosis not present

## 2018-01-13 DIAGNOSIS — E1151 Type 2 diabetes mellitus with diabetic peripheral angiopathy without gangrene: Secondary | ICD-10-CM | POA: Diagnosis not present

## 2018-01-13 DIAGNOSIS — E11621 Type 2 diabetes mellitus with foot ulcer: Secondary | ICD-10-CM | POA: Diagnosis not present

## 2018-01-14 DIAGNOSIS — L97529 Non-pressure chronic ulcer of other part of left foot with unspecified severity: Secondary | ICD-10-CM | POA: Diagnosis not present

## 2018-01-14 DIAGNOSIS — E1151 Type 2 diabetes mellitus with diabetic peripheral angiopathy without gangrene: Secondary | ICD-10-CM | POA: Diagnosis not present

## 2018-01-14 DIAGNOSIS — L97512 Non-pressure chronic ulcer of other part of right foot with fat layer exposed: Secondary | ICD-10-CM | POA: Diagnosis not present

## 2018-01-14 DIAGNOSIS — E1142 Type 2 diabetes mellitus with diabetic polyneuropathy: Secondary | ICD-10-CM | POA: Diagnosis not present

## 2018-01-14 DIAGNOSIS — E1122 Type 2 diabetes mellitus with diabetic chronic kidney disease: Secondary | ICD-10-CM | POA: Diagnosis not present

## 2018-01-14 DIAGNOSIS — E11621 Type 2 diabetes mellitus with foot ulcer: Secondary | ICD-10-CM | POA: Diagnosis not present

## 2018-01-15 DIAGNOSIS — L97512 Non-pressure chronic ulcer of other part of right foot with fat layer exposed: Secondary | ICD-10-CM | POA: Diagnosis not present

## 2018-01-15 DIAGNOSIS — L97529 Non-pressure chronic ulcer of other part of left foot with unspecified severity: Secondary | ICD-10-CM | POA: Diagnosis not present

## 2018-01-15 DIAGNOSIS — E1122 Type 2 diabetes mellitus with diabetic chronic kidney disease: Secondary | ICD-10-CM | POA: Diagnosis not present

## 2018-01-15 DIAGNOSIS — E11621 Type 2 diabetes mellitus with foot ulcer: Secondary | ICD-10-CM | POA: Diagnosis not present

## 2018-01-15 DIAGNOSIS — E1142 Type 2 diabetes mellitus with diabetic polyneuropathy: Secondary | ICD-10-CM | POA: Diagnosis not present

## 2018-01-15 DIAGNOSIS — E1151 Type 2 diabetes mellitus with diabetic peripheral angiopathy without gangrene: Secondary | ICD-10-CM | POA: Diagnosis not present

## 2018-01-16 DIAGNOSIS — L97529 Non-pressure chronic ulcer of other part of left foot with unspecified severity: Secondary | ICD-10-CM | POA: Diagnosis not present

## 2018-01-16 DIAGNOSIS — E1151 Type 2 diabetes mellitus with diabetic peripheral angiopathy without gangrene: Secondary | ICD-10-CM | POA: Diagnosis not present

## 2018-01-16 DIAGNOSIS — L97512 Non-pressure chronic ulcer of other part of right foot with fat layer exposed: Secondary | ICD-10-CM | POA: Diagnosis not present

## 2018-01-16 DIAGNOSIS — E11621 Type 2 diabetes mellitus with foot ulcer: Secondary | ICD-10-CM | POA: Diagnosis not present

## 2018-01-16 DIAGNOSIS — E1122 Type 2 diabetes mellitus with diabetic chronic kidney disease: Secondary | ICD-10-CM | POA: Diagnosis not present

## 2018-01-16 DIAGNOSIS — E1142 Type 2 diabetes mellitus with diabetic polyneuropathy: Secondary | ICD-10-CM | POA: Diagnosis not present

## 2018-01-17 DIAGNOSIS — L97512 Non-pressure chronic ulcer of other part of right foot with fat layer exposed: Secondary | ICD-10-CM | POA: Diagnosis not present

## 2018-01-17 DIAGNOSIS — E1122 Type 2 diabetes mellitus with diabetic chronic kidney disease: Secondary | ICD-10-CM | POA: Diagnosis not present

## 2018-01-17 DIAGNOSIS — E1142 Type 2 diabetes mellitus with diabetic polyneuropathy: Secondary | ICD-10-CM | POA: Diagnosis not present

## 2018-01-17 DIAGNOSIS — E1151 Type 2 diabetes mellitus with diabetic peripheral angiopathy without gangrene: Secondary | ICD-10-CM | POA: Diagnosis not present

## 2018-01-17 DIAGNOSIS — L97529 Non-pressure chronic ulcer of other part of left foot with unspecified severity: Secondary | ICD-10-CM | POA: Diagnosis not present

## 2018-01-17 DIAGNOSIS — E11621 Type 2 diabetes mellitus with foot ulcer: Secondary | ICD-10-CM | POA: Diagnosis not present

## 2018-01-18 DIAGNOSIS — E1151 Type 2 diabetes mellitus with diabetic peripheral angiopathy without gangrene: Secondary | ICD-10-CM | POA: Diagnosis not present

## 2018-01-18 DIAGNOSIS — E1122 Type 2 diabetes mellitus with diabetic chronic kidney disease: Secondary | ICD-10-CM | POA: Diagnosis not present

## 2018-01-18 DIAGNOSIS — E1142 Type 2 diabetes mellitus with diabetic polyneuropathy: Secondary | ICD-10-CM | POA: Diagnosis not present

## 2018-01-18 DIAGNOSIS — L97512 Non-pressure chronic ulcer of other part of right foot with fat layer exposed: Secondary | ICD-10-CM | POA: Diagnosis not present

## 2018-01-18 DIAGNOSIS — L97529 Non-pressure chronic ulcer of other part of left foot with unspecified severity: Secondary | ICD-10-CM | POA: Diagnosis not present

## 2018-01-18 DIAGNOSIS — E11621 Type 2 diabetes mellitus with foot ulcer: Secondary | ICD-10-CM | POA: Diagnosis not present

## 2018-01-19 DIAGNOSIS — L97512 Non-pressure chronic ulcer of other part of right foot with fat layer exposed: Secondary | ICD-10-CM | POA: Diagnosis not present

## 2018-01-19 DIAGNOSIS — E1151 Type 2 diabetes mellitus with diabetic peripheral angiopathy without gangrene: Secondary | ICD-10-CM | POA: Diagnosis not present

## 2018-01-19 DIAGNOSIS — E1142 Type 2 diabetes mellitus with diabetic polyneuropathy: Secondary | ICD-10-CM | POA: Diagnosis not present

## 2018-01-19 DIAGNOSIS — L97529 Non-pressure chronic ulcer of other part of left foot with unspecified severity: Secondary | ICD-10-CM | POA: Diagnosis not present

## 2018-01-19 DIAGNOSIS — E1122 Type 2 diabetes mellitus with diabetic chronic kidney disease: Secondary | ICD-10-CM | POA: Diagnosis not present

## 2018-01-19 DIAGNOSIS — E11621 Type 2 diabetes mellitus with foot ulcer: Secondary | ICD-10-CM | POA: Diagnosis not present

## 2018-01-20 DIAGNOSIS — E1142 Type 2 diabetes mellitus with diabetic polyneuropathy: Secondary | ICD-10-CM | POA: Diagnosis not present

## 2018-01-20 DIAGNOSIS — L97529 Non-pressure chronic ulcer of other part of left foot with unspecified severity: Secondary | ICD-10-CM | POA: Diagnosis not present

## 2018-01-20 DIAGNOSIS — L97512 Non-pressure chronic ulcer of other part of right foot with fat layer exposed: Secondary | ICD-10-CM | POA: Diagnosis not present

## 2018-01-20 DIAGNOSIS — E1122 Type 2 diabetes mellitus with diabetic chronic kidney disease: Secondary | ICD-10-CM | POA: Diagnosis not present

## 2018-01-20 DIAGNOSIS — E1151 Type 2 diabetes mellitus with diabetic peripheral angiopathy without gangrene: Secondary | ICD-10-CM | POA: Diagnosis not present

## 2018-01-20 DIAGNOSIS — E11621 Type 2 diabetes mellitus with foot ulcer: Secondary | ICD-10-CM | POA: Diagnosis not present

## 2018-01-22 DIAGNOSIS — L97529 Non-pressure chronic ulcer of other part of left foot with unspecified severity: Secondary | ICD-10-CM | POA: Diagnosis not present

## 2018-01-22 DIAGNOSIS — E1122 Type 2 diabetes mellitus with diabetic chronic kidney disease: Secondary | ICD-10-CM | POA: Diagnosis not present

## 2018-01-22 DIAGNOSIS — E1142 Type 2 diabetes mellitus with diabetic polyneuropathy: Secondary | ICD-10-CM | POA: Diagnosis not present

## 2018-01-22 DIAGNOSIS — E11621 Type 2 diabetes mellitus with foot ulcer: Secondary | ICD-10-CM | POA: Diagnosis not present

## 2018-01-22 DIAGNOSIS — L97512 Non-pressure chronic ulcer of other part of right foot with fat layer exposed: Secondary | ICD-10-CM | POA: Diagnosis not present

## 2018-01-22 DIAGNOSIS — E1151 Type 2 diabetes mellitus with diabetic peripheral angiopathy without gangrene: Secondary | ICD-10-CM | POA: Diagnosis not present

## 2018-01-26 DIAGNOSIS — E11621 Type 2 diabetes mellitus with foot ulcer: Secondary | ICD-10-CM | POA: Diagnosis not present

## 2018-01-26 DIAGNOSIS — E1142 Type 2 diabetes mellitus with diabetic polyneuropathy: Secondary | ICD-10-CM | POA: Diagnosis not present

## 2018-01-26 DIAGNOSIS — L03115 Cellulitis of right lower limb: Secondary | ICD-10-CM | POA: Diagnosis not present

## 2018-01-26 DIAGNOSIS — L97529 Non-pressure chronic ulcer of other part of left foot with unspecified severity: Secondary | ICD-10-CM | POA: Diagnosis not present

## 2018-01-26 DIAGNOSIS — E1151 Type 2 diabetes mellitus with diabetic peripheral angiopathy without gangrene: Secondary | ICD-10-CM | POA: Diagnosis not present

## 2018-01-26 DIAGNOSIS — L97512 Non-pressure chronic ulcer of other part of right foot with fat layer exposed: Secondary | ICD-10-CM | POA: Diagnosis not present

## 2018-01-26 DIAGNOSIS — L97522 Non-pressure chronic ulcer of other part of left foot with fat layer exposed: Secondary | ICD-10-CM | POA: Diagnosis not present

## 2018-01-26 DIAGNOSIS — E1122 Type 2 diabetes mellitus with diabetic chronic kidney disease: Secondary | ICD-10-CM | POA: Diagnosis not present

## 2018-01-27 DIAGNOSIS — L97512 Non-pressure chronic ulcer of other part of right foot with fat layer exposed: Secondary | ICD-10-CM | POA: Diagnosis not present

## 2018-01-27 DIAGNOSIS — E1142 Type 2 diabetes mellitus with diabetic polyneuropathy: Secondary | ICD-10-CM | POA: Diagnosis not present

## 2018-01-27 DIAGNOSIS — E1122 Type 2 diabetes mellitus with diabetic chronic kidney disease: Secondary | ICD-10-CM | POA: Diagnosis not present

## 2018-01-27 DIAGNOSIS — E1151 Type 2 diabetes mellitus with diabetic peripheral angiopathy without gangrene: Secondary | ICD-10-CM | POA: Diagnosis not present

## 2018-01-27 DIAGNOSIS — E11621 Type 2 diabetes mellitus with foot ulcer: Secondary | ICD-10-CM | POA: Diagnosis not present

## 2018-01-27 DIAGNOSIS — L97529 Non-pressure chronic ulcer of other part of left foot with unspecified severity: Secondary | ICD-10-CM | POA: Diagnosis not present

## 2018-01-28 DIAGNOSIS — E1142 Type 2 diabetes mellitus with diabetic polyneuropathy: Secondary | ICD-10-CM | POA: Diagnosis not present

## 2018-01-28 DIAGNOSIS — E1151 Type 2 diabetes mellitus with diabetic peripheral angiopathy without gangrene: Secondary | ICD-10-CM | POA: Diagnosis not present

## 2018-01-28 DIAGNOSIS — L97529 Non-pressure chronic ulcer of other part of left foot with unspecified severity: Secondary | ICD-10-CM | POA: Diagnosis not present

## 2018-01-28 DIAGNOSIS — L97512 Non-pressure chronic ulcer of other part of right foot with fat layer exposed: Secondary | ICD-10-CM | POA: Diagnosis not present

## 2018-01-28 DIAGNOSIS — E11621 Type 2 diabetes mellitus with foot ulcer: Secondary | ICD-10-CM | POA: Diagnosis not present

## 2018-01-28 DIAGNOSIS — E1122 Type 2 diabetes mellitus with diabetic chronic kidney disease: Secondary | ICD-10-CM | POA: Diagnosis not present

## 2018-01-30 DIAGNOSIS — E11621 Type 2 diabetes mellitus with foot ulcer: Secondary | ICD-10-CM | POA: Diagnosis not present

## 2018-01-30 DIAGNOSIS — L97512 Non-pressure chronic ulcer of other part of right foot with fat layer exposed: Secondary | ICD-10-CM | POA: Diagnosis not present

## 2018-01-30 DIAGNOSIS — E1122 Type 2 diabetes mellitus with diabetic chronic kidney disease: Secondary | ICD-10-CM | POA: Diagnosis not present

## 2018-01-30 DIAGNOSIS — E1151 Type 2 diabetes mellitus with diabetic peripheral angiopathy without gangrene: Secondary | ICD-10-CM | POA: Diagnosis not present

## 2018-01-30 DIAGNOSIS — E1142 Type 2 diabetes mellitus with diabetic polyneuropathy: Secondary | ICD-10-CM | POA: Diagnosis not present

## 2018-01-30 DIAGNOSIS — L97529 Non-pressure chronic ulcer of other part of left foot with unspecified severity: Secondary | ICD-10-CM | POA: Diagnosis not present

## 2018-02-01 DIAGNOSIS — E11621 Type 2 diabetes mellitus with foot ulcer: Secondary | ICD-10-CM | POA: Diagnosis not present

## 2018-02-01 DIAGNOSIS — L97529 Non-pressure chronic ulcer of other part of left foot with unspecified severity: Secondary | ICD-10-CM | POA: Diagnosis not present

## 2018-02-01 DIAGNOSIS — L97512 Non-pressure chronic ulcer of other part of right foot with fat layer exposed: Secondary | ICD-10-CM | POA: Diagnosis not present

## 2018-02-01 DIAGNOSIS — E1151 Type 2 diabetes mellitus with diabetic peripheral angiopathy without gangrene: Secondary | ICD-10-CM | POA: Diagnosis not present

## 2018-02-01 DIAGNOSIS — E1142 Type 2 diabetes mellitus with diabetic polyneuropathy: Secondary | ICD-10-CM | POA: Diagnosis not present

## 2018-02-01 DIAGNOSIS — E1122 Type 2 diabetes mellitus with diabetic chronic kidney disease: Secondary | ICD-10-CM | POA: Diagnosis not present

## 2018-02-02 DIAGNOSIS — E1142 Type 2 diabetes mellitus with diabetic polyneuropathy: Secondary | ICD-10-CM | POA: Diagnosis not present

## 2018-02-02 DIAGNOSIS — L97529 Non-pressure chronic ulcer of other part of left foot with unspecified severity: Secondary | ICD-10-CM | POA: Diagnosis not present

## 2018-02-02 DIAGNOSIS — L97512 Non-pressure chronic ulcer of other part of right foot with fat layer exposed: Secondary | ICD-10-CM | POA: Diagnosis not present

## 2018-02-02 DIAGNOSIS — E1122 Type 2 diabetes mellitus with diabetic chronic kidney disease: Secondary | ICD-10-CM | POA: Diagnosis not present

## 2018-02-02 DIAGNOSIS — E1151 Type 2 diabetes mellitus with diabetic peripheral angiopathy without gangrene: Secondary | ICD-10-CM | POA: Diagnosis not present

## 2018-02-02 DIAGNOSIS — E11621 Type 2 diabetes mellitus with foot ulcer: Secondary | ICD-10-CM | POA: Diagnosis not present

## 2018-02-03 DIAGNOSIS — A4902 Methicillin resistant Staphylococcus aureus infection, unspecified site: Secondary | ICD-10-CM | POA: Diagnosis not present

## 2018-02-03 DIAGNOSIS — G629 Polyneuropathy, unspecified: Secondary | ICD-10-CM | POA: Diagnosis not present

## 2018-02-03 DIAGNOSIS — E119 Type 2 diabetes mellitus without complications: Secondary | ICD-10-CM | POA: Diagnosis not present

## 2018-02-03 DIAGNOSIS — E1149 Type 2 diabetes mellitus with other diabetic neurological complication: Secondary | ICD-10-CM | POA: Diagnosis not present

## 2018-02-03 DIAGNOSIS — L309 Dermatitis, unspecified: Secondary | ICD-10-CM | POA: Diagnosis not present

## 2018-02-03 DIAGNOSIS — E1142 Type 2 diabetes mellitus with diabetic polyneuropathy: Secondary | ICD-10-CM | POA: Diagnosis not present

## 2018-02-03 DIAGNOSIS — E11621 Type 2 diabetes mellitus with foot ulcer: Secondary | ICD-10-CM | POA: Diagnosis not present

## 2018-02-03 DIAGNOSIS — L97512 Non-pressure chronic ulcer of other part of right foot with fat layer exposed: Secondary | ICD-10-CM | POA: Diagnosis not present

## 2018-02-03 DIAGNOSIS — L97529 Non-pressure chronic ulcer of other part of left foot with unspecified severity: Secondary | ICD-10-CM | POA: Diagnosis not present

## 2018-02-03 DIAGNOSIS — E1122 Type 2 diabetes mellitus with diabetic chronic kidney disease: Secondary | ICD-10-CM | POA: Diagnosis not present

## 2018-02-03 DIAGNOSIS — E1159 Type 2 diabetes mellitus with other circulatory complications: Secondary | ICD-10-CM | POA: Diagnosis not present

## 2018-02-03 DIAGNOSIS — Z8631 Personal history of diabetic foot ulcer: Secondary | ICD-10-CM | POA: Diagnosis not present

## 2018-02-03 DIAGNOSIS — L853 Xerosis cutis: Secondary | ICD-10-CM | POA: Diagnosis not present

## 2018-02-03 DIAGNOSIS — I739 Peripheral vascular disease, unspecified: Secondary | ICD-10-CM | POA: Diagnosis not present

## 2018-02-03 DIAGNOSIS — L98499 Non-pressure chronic ulcer of skin of other sites with unspecified severity: Secondary | ICD-10-CM | POA: Diagnosis not present

## 2018-02-03 DIAGNOSIS — E1151 Type 2 diabetes mellitus with diabetic peripheral angiopathy without gangrene: Secondary | ICD-10-CM | POA: Diagnosis not present

## 2018-02-03 DIAGNOSIS — E1140 Type 2 diabetes mellitus with diabetic neuropathy, unspecified: Secondary | ICD-10-CM | POA: Diagnosis not present

## 2018-02-04 DIAGNOSIS — E1142 Type 2 diabetes mellitus with diabetic polyneuropathy: Secondary | ICD-10-CM | POA: Diagnosis not present

## 2018-02-04 DIAGNOSIS — L97512 Non-pressure chronic ulcer of other part of right foot with fat layer exposed: Secondary | ICD-10-CM | POA: Diagnosis not present

## 2018-02-04 DIAGNOSIS — E1151 Type 2 diabetes mellitus with diabetic peripheral angiopathy without gangrene: Secondary | ICD-10-CM | POA: Diagnosis not present

## 2018-02-04 DIAGNOSIS — E1122 Type 2 diabetes mellitus with diabetic chronic kidney disease: Secondary | ICD-10-CM | POA: Diagnosis not present

## 2018-02-04 DIAGNOSIS — E11621 Type 2 diabetes mellitus with foot ulcer: Secondary | ICD-10-CM | POA: Diagnosis not present

## 2018-02-04 DIAGNOSIS — L97529 Non-pressure chronic ulcer of other part of left foot with unspecified severity: Secondary | ICD-10-CM | POA: Diagnosis not present

## 2018-02-05 DIAGNOSIS — E11621 Type 2 diabetes mellitus with foot ulcer: Secondary | ICD-10-CM | POA: Diagnosis not present

## 2018-02-05 DIAGNOSIS — E1151 Type 2 diabetes mellitus with diabetic peripheral angiopathy without gangrene: Secondary | ICD-10-CM | POA: Diagnosis not present

## 2018-02-05 DIAGNOSIS — L97512 Non-pressure chronic ulcer of other part of right foot with fat layer exposed: Secondary | ICD-10-CM | POA: Diagnosis not present

## 2018-02-05 DIAGNOSIS — E1142 Type 2 diabetes mellitus with diabetic polyneuropathy: Secondary | ICD-10-CM | POA: Diagnosis not present

## 2018-02-05 DIAGNOSIS — E1122 Type 2 diabetes mellitus with diabetic chronic kidney disease: Secondary | ICD-10-CM | POA: Diagnosis not present

## 2018-02-05 DIAGNOSIS — L97529 Non-pressure chronic ulcer of other part of left foot with unspecified severity: Secondary | ICD-10-CM | POA: Diagnosis not present

## 2018-02-08 DIAGNOSIS — E1142 Type 2 diabetes mellitus with diabetic polyneuropathy: Secondary | ICD-10-CM | POA: Diagnosis not present

## 2018-02-08 DIAGNOSIS — E11621 Type 2 diabetes mellitus with foot ulcer: Secondary | ICD-10-CM | POA: Diagnosis not present

## 2018-02-08 DIAGNOSIS — L97512 Non-pressure chronic ulcer of other part of right foot with fat layer exposed: Secondary | ICD-10-CM | POA: Diagnosis not present

## 2018-02-08 DIAGNOSIS — E1151 Type 2 diabetes mellitus with diabetic peripheral angiopathy without gangrene: Secondary | ICD-10-CM | POA: Diagnosis not present

## 2018-02-08 DIAGNOSIS — L97529 Non-pressure chronic ulcer of other part of left foot with unspecified severity: Secondary | ICD-10-CM | POA: Diagnosis not present

## 2018-02-08 DIAGNOSIS — E1122 Type 2 diabetes mellitus with diabetic chronic kidney disease: Secondary | ICD-10-CM | POA: Diagnosis not present

## 2018-02-10 DIAGNOSIS — L97529 Non-pressure chronic ulcer of other part of left foot with unspecified severity: Secondary | ICD-10-CM | POA: Diagnosis not present

## 2018-02-10 DIAGNOSIS — E1142 Type 2 diabetes mellitus with diabetic polyneuropathy: Secondary | ICD-10-CM | POA: Diagnosis not present

## 2018-02-10 DIAGNOSIS — E1122 Type 2 diabetes mellitus with diabetic chronic kidney disease: Secondary | ICD-10-CM | POA: Diagnosis not present

## 2018-02-10 DIAGNOSIS — E11621 Type 2 diabetes mellitus with foot ulcer: Secondary | ICD-10-CM | POA: Diagnosis not present

## 2018-02-10 DIAGNOSIS — E1151 Type 2 diabetes mellitus with diabetic peripheral angiopathy without gangrene: Secondary | ICD-10-CM | POA: Diagnosis not present

## 2018-02-10 DIAGNOSIS — L97512 Non-pressure chronic ulcer of other part of right foot with fat layer exposed: Secondary | ICD-10-CM | POA: Diagnosis not present

## 2018-02-12 DIAGNOSIS — L97529 Non-pressure chronic ulcer of other part of left foot with unspecified severity: Secondary | ICD-10-CM | POA: Diagnosis not present

## 2018-02-12 DIAGNOSIS — E1122 Type 2 diabetes mellitus with diabetic chronic kidney disease: Secondary | ICD-10-CM | POA: Diagnosis not present

## 2018-02-12 DIAGNOSIS — E1151 Type 2 diabetes mellitus with diabetic peripheral angiopathy without gangrene: Secondary | ICD-10-CM | POA: Diagnosis not present

## 2018-02-12 DIAGNOSIS — L97512 Non-pressure chronic ulcer of other part of right foot with fat layer exposed: Secondary | ICD-10-CM | POA: Diagnosis not present

## 2018-02-12 DIAGNOSIS — E11621 Type 2 diabetes mellitus with foot ulcer: Secondary | ICD-10-CM | POA: Diagnosis not present

## 2018-02-12 DIAGNOSIS — E1142 Type 2 diabetes mellitus with diabetic polyneuropathy: Secondary | ICD-10-CM | POA: Diagnosis not present

## 2018-02-15 DIAGNOSIS — L97529 Non-pressure chronic ulcer of other part of left foot with unspecified severity: Secondary | ICD-10-CM | POA: Diagnosis not present

## 2018-02-15 DIAGNOSIS — L97512 Non-pressure chronic ulcer of other part of right foot with fat layer exposed: Secondary | ICD-10-CM | POA: Diagnosis not present

## 2018-02-15 DIAGNOSIS — E1151 Type 2 diabetes mellitus with diabetic peripheral angiopathy without gangrene: Secondary | ICD-10-CM | POA: Diagnosis not present

## 2018-02-15 DIAGNOSIS — E1122 Type 2 diabetes mellitus with diabetic chronic kidney disease: Secondary | ICD-10-CM | POA: Diagnosis not present

## 2018-02-15 DIAGNOSIS — E11621 Type 2 diabetes mellitus with foot ulcer: Secondary | ICD-10-CM | POA: Diagnosis not present

## 2018-02-15 DIAGNOSIS — E1142 Type 2 diabetes mellitus with diabetic polyneuropathy: Secondary | ICD-10-CM | POA: Diagnosis not present

## 2018-02-16 DIAGNOSIS — E1142 Type 2 diabetes mellitus with diabetic polyneuropathy: Secondary | ICD-10-CM | POA: Diagnosis not present

## 2018-02-16 DIAGNOSIS — E1122 Type 2 diabetes mellitus with diabetic chronic kidney disease: Secondary | ICD-10-CM | POA: Diagnosis not present

## 2018-02-16 DIAGNOSIS — E11621 Type 2 diabetes mellitus with foot ulcer: Secondary | ICD-10-CM | POA: Diagnosis not present

## 2018-02-16 DIAGNOSIS — E1151 Type 2 diabetes mellitus with diabetic peripheral angiopathy without gangrene: Secondary | ICD-10-CM | POA: Diagnosis not present

## 2018-02-16 DIAGNOSIS — L97512 Non-pressure chronic ulcer of other part of right foot with fat layer exposed: Secondary | ICD-10-CM | POA: Diagnosis not present

## 2018-02-16 DIAGNOSIS — L97529 Non-pressure chronic ulcer of other part of left foot with unspecified severity: Secondary | ICD-10-CM | POA: Diagnosis not present

## 2018-02-16 DIAGNOSIS — L97522 Non-pressure chronic ulcer of other part of left foot with fat layer exposed: Secondary | ICD-10-CM | POA: Diagnosis not present

## 2018-02-17 DIAGNOSIS — L97529 Non-pressure chronic ulcer of other part of left foot with unspecified severity: Secondary | ICD-10-CM | POA: Diagnosis not present

## 2018-02-17 DIAGNOSIS — E1151 Type 2 diabetes mellitus with diabetic peripheral angiopathy without gangrene: Secondary | ICD-10-CM | POA: Diagnosis not present

## 2018-02-17 DIAGNOSIS — A4902 Methicillin resistant Staphylococcus aureus infection, unspecified site: Secondary | ICD-10-CM | POA: Diagnosis not present

## 2018-02-17 DIAGNOSIS — L853 Xerosis cutis: Secondary | ICD-10-CM | POA: Diagnosis not present

## 2018-02-17 DIAGNOSIS — E1142 Type 2 diabetes mellitus with diabetic polyneuropathy: Secondary | ICD-10-CM | POA: Diagnosis not present

## 2018-02-17 DIAGNOSIS — L309 Dermatitis, unspecified: Secondary | ICD-10-CM | POA: Diagnosis not present

## 2018-02-17 DIAGNOSIS — E1122 Type 2 diabetes mellitus with diabetic chronic kidney disease: Secondary | ICD-10-CM | POA: Diagnosis not present

## 2018-02-17 DIAGNOSIS — E11621 Type 2 diabetes mellitus with foot ulcer: Secondary | ICD-10-CM | POA: Diagnosis not present

## 2018-02-17 DIAGNOSIS — L97512 Non-pressure chronic ulcer of other part of right foot with fat layer exposed: Secondary | ICD-10-CM | POA: Diagnosis not present

## 2018-02-19 DIAGNOSIS — E1151 Type 2 diabetes mellitus with diabetic peripheral angiopathy without gangrene: Secondary | ICD-10-CM | POA: Diagnosis not present

## 2018-02-19 DIAGNOSIS — E11621 Type 2 diabetes mellitus with foot ulcer: Secondary | ICD-10-CM | POA: Diagnosis not present

## 2018-02-19 DIAGNOSIS — E1122 Type 2 diabetes mellitus with diabetic chronic kidney disease: Secondary | ICD-10-CM | POA: Diagnosis not present

## 2018-02-19 DIAGNOSIS — E1142 Type 2 diabetes mellitus with diabetic polyneuropathy: Secondary | ICD-10-CM | POA: Diagnosis not present

## 2018-02-19 DIAGNOSIS — L97529 Non-pressure chronic ulcer of other part of left foot with unspecified severity: Secondary | ICD-10-CM | POA: Diagnosis not present

## 2018-02-19 DIAGNOSIS — L97512 Non-pressure chronic ulcer of other part of right foot with fat layer exposed: Secondary | ICD-10-CM | POA: Diagnosis not present

## 2018-02-22 DIAGNOSIS — L97512 Non-pressure chronic ulcer of other part of right foot with fat layer exposed: Secondary | ICD-10-CM | POA: Diagnosis not present

## 2018-02-22 DIAGNOSIS — E11621 Type 2 diabetes mellitus with foot ulcer: Secondary | ICD-10-CM | POA: Diagnosis not present

## 2018-02-22 DIAGNOSIS — E1122 Type 2 diabetes mellitus with diabetic chronic kidney disease: Secondary | ICD-10-CM | POA: Diagnosis not present

## 2018-02-22 DIAGNOSIS — E1142 Type 2 diabetes mellitus with diabetic polyneuropathy: Secondary | ICD-10-CM | POA: Diagnosis not present

## 2018-02-22 DIAGNOSIS — L97529 Non-pressure chronic ulcer of other part of left foot with unspecified severity: Secondary | ICD-10-CM | POA: Diagnosis not present

## 2018-02-22 DIAGNOSIS — E1151 Type 2 diabetes mellitus with diabetic peripheral angiopathy without gangrene: Secondary | ICD-10-CM | POA: Diagnosis not present

## 2018-02-24 DIAGNOSIS — E1151 Type 2 diabetes mellitus with diabetic peripheral angiopathy without gangrene: Secondary | ICD-10-CM | POA: Diagnosis not present

## 2018-02-24 DIAGNOSIS — E11621 Type 2 diabetes mellitus with foot ulcer: Secondary | ICD-10-CM | POA: Diagnosis not present

## 2018-02-24 DIAGNOSIS — L97512 Non-pressure chronic ulcer of other part of right foot with fat layer exposed: Secondary | ICD-10-CM | POA: Diagnosis not present

## 2018-02-24 DIAGNOSIS — L97529 Non-pressure chronic ulcer of other part of left foot with unspecified severity: Secondary | ICD-10-CM | POA: Diagnosis not present

## 2018-02-24 DIAGNOSIS — E1142 Type 2 diabetes mellitus with diabetic polyneuropathy: Secondary | ICD-10-CM | POA: Diagnosis not present

## 2018-02-24 DIAGNOSIS — E1122 Type 2 diabetes mellitus with diabetic chronic kidney disease: Secondary | ICD-10-CM | POA: Diagnosis not present

## 2018-02-26 DIAGNOSIS — E1142 Type 2 diabetes mellitus with diabetic polyneuropathy: Secondary | ICD-10-CM | POA: Diagnosis not present

## 2018-02-26 DIAGNOSIS — L97512 Non-pressure chronic ulcer of other part of right foot with fat layer exposed: Secondary | ICD-10-CM | POA: Diagnosis not present

## 2018-02-26 DIAGNOSIS — E1151 Type 2 diabetes mellitus with diabetic peripheral angiopathy without gangrene: Secondary | ICD-10-CM | POA: Diagnosis not present

## 2018-02-26 DIAGNOSIS — L97529 Non-pressure chronic ulcer of other part of left foot with unspecified severity: Secondary | ICD-10-CM | POA: Diagnosis not present

## 2018-02-26 DIAGNOSIS — E11621 Type 2 diabetes mellitus with foot ulcer: Secondary | ICD-10-CM | POA: Diagnosis not present

## 2018-02-26 DIAGNOSIS — E1122 Type 2 diabetes mellitus with diabetic chronic kidney disease: Secondary | ICD-10-CM | POA: Diagnosis not present

## 2018-03-01 DIAGNOSIS — E1122 Type 2 diabetes mellitus with diabetic chronic kidney disease: Secondary | ICD-10-CM | POA: Diagnosis not present

## 2018-03-01 DIAGNOSIS — L97529 Non-pressure chronic ulcer of other part of left foot with unspecified severity: Secondary | ICD-10-CM | POA: Diagnosis not present

## 2018-03-01 DIAGNOSIS — E1151 Type 2 diabetes mellitus with diabetic peripheral angiopathy without gangrene: Secondary | ICD-10-CM | POA: Diagnosis not present

## 2018-03-01 DIAGNOSIS — E11621 Type 2 diabetes mellitus with foot ulcer: Secondary | ICD-10-CM | POA: Diagnosis not present

## 2018-03-01 DIAGNOSIS — E1142 Type 2 diabetes mellitus with diabetic polyneuropathy: Secondary | ICD-10-CM | POA: Diagnosis not present

## 2018-03-01 DIAGNOSIS — L97512 Non-pressure chronic ulcer of other part of right foot with fat layer exposed: Secondary | ICD-10-CM | POA: Diagnosis not present

## 2018-03-03 DIAGNOSIS — L97529 Non-pressure chronic ulcer of other part of left foot with unspecified severity: Secondary | ICD-10-CM | POA: Diagnosis not present

## 2018-03-03 DIAGNOSIS — E1122 Type 2 diabetes mellitus with diabetic chronic kidney disease: Secondary | ICD-10-CM | POA: Diagnosis not present

## 2018-03-03 DIAGNOSIS — E1151 Type 2 diabetes mellitus with diabetic peripheral angiopathy without gangrene: Secondary | ICD-10-CM | POA: Diagnosis not present

## 2018-03-03 DIAGNOSIS — L97512 Non-pressure chronic ulcer of other part of right foot with fat layer exposed: Secondary | ICD-10-CM | POA: Diagnosis not present

## 2018-03-03 DIAGNOSIS — E11621 Type 2 diabetes mellitus with foot ulcer: Secondary | ICD-10-CM | POA: Diagnosis not present

## 2018-03-03 DIAGNOSIS — E1142 Type 2 diabetes mellitus with diabetic polyneuropathy: Secondary | ICD-10-CM | POA: Diagnosis not present

## 2018-03-10 DIAGNOSIS — L97512 Non-pressure chronic ulcer of other part of right foot with fat layer exposed: Secondary | ICD-10-CM | POA: Diagnosis not present

## 2018-03-10 DIAGNOSIS — E1142 Type 2 diabetes mellitus with diabetic polyneuropathy: Secondary | ICD-10-CM | POA: Diagnosis not present

## 2018-03-10 DIAGNOSIS — E11621 Type 2 diabetes mellitus with foot ulcer: Secondary | ICD-10-CM | POA: Diagnosis not present

## 2018-03-10 DIAGNOSIS — E1122 Type 2 diabetes mellitus with diabetic chronic kidney disease: Secondary | ICD-10-CM | POA: Diagnosis not present

## 2018-03-10 DIAGNOSIS — L97529 Non-pressure chronic ulcer of other part of left foot with unspecified severity: Secondary | ICD-10-CM | POA: Diagnosis not present

## 2018-03-10 DIAGNOSIS — E1151 Type 2 diabetes mellitus with diabetic peripheral angiopathy without gangrene: Secondary | ICD-10-CM | POA: Diagnosis not present

## 2018-03-12 DIAGNOSIS — E1151 Type 2 diabetes mellitus with diabetic peripheral angiopathy without gangrene: Secondary | ICD-10-CM | POA: Diagnosis not present

## 2018-03-12 DIAGNOSIS — E1122 Type 2 diabetes mellitus with diabetic chronic kidney disease: Secondary | ICD-10-CM | POA: Diagnosis not present

## 2018-03-12 DIAGNOSIS — L97529 Non-pressure chronic ulcer of other part of left foot with unspecified severity: Secondary | ICD-10-CM | POA: Diagnosis not present

## 2018-03-12 DIAGNOSIS — E1142 Type 2 diabetes mellitus with diabetic polyneuropathy: Secondary | ICD-10-CM | POA: Diagnosis not present

## 2018-03-12 DIAGNOSIS — E11621 Type 2 diabetes mellitus with foot ulcer: Secondary | ICD-10-CM | POA: Diagnosis not present

## 2018-03-12 DIAGNOSIS — L97512 Non-pressure chronic ulcer of other part of right foot with fat layer exposed: Secondary | ICD-10-CM | POA: Diagnosis not present

## 2018-03-15 DIAGNOSIS — E1122 Type 2 diabetes mellitus with diabetic chronic kidney disease: Secondary | ICD-10-CM | POA: Diagnosis not present

## 2018-03-15 DIAGNOSIS — L97512 Non-pressure chronic ulcer of other part of right foot with fat layer exposed: Secondary | ICD-10-CM | POA: Diagnosis not present

## 2018-03-15 DIAGNOSIS — L97529 Non-pressure chronic ulcer of other part of left foot with unspecified severity: Secondary | ICD-10-CM | POA: Diagnosis not present

## 2018-03-15 DIAGNOSIS — E11621 Type 2 diabetes mellitus with foot ulcer: Secondary | ICD-10-CM | POA: Diagnosis not present

## 2018-03-15 DIAGNOSIS — E1151 Type 2 diabetes mellitus with diabetic peripheral angiopathy without gangrene: Secondary | ICD-10-CM | POA: Diagnosis not present

## 2018-03-15 DIAGNOSIS — E1142 Type 2 diabetes mellitus with diabetic polyneuropathy: Secondary | ICD-10-CM | POA: Diagnosis not present

## 2018-03-16 DIAGNOSIS — L97522 Non-pressure chronic ulcer of other part of left foot with fat layer exposed: Secondary | ICD-10-CM | POA: Diagnosis not present

## 2018-03-16 DIAGNOSIS — L97512 Non-pressure chronic ulcer of other part of right foot with fat layer exposed: Secondary | ICD-10-CM | POA: Diagnosis not present

## 2018-03-17 DIAGNOSIS — E1122 Type 2 diabetes mellitus with diabetic chronic kidney disease: Secondary | ICD-10-CM | POA: Diagnosis not present

## 2018-03-17 DIAGNOSIS — L97529 Non-pressure chronic ulcer of other part of left foot with unspecified severity: Secondary | ICD-10-CM | POA: Diagnosis not present

## 2018-03-17 DIAGNOSIS — L97512 Non-pressure chronic ulcer of other part of right foot with fat layer exposed: Secondary | ICD-10-CM | POA: Diagnosis not present

## 2018-03-17 DIAGNOSIS — E1151 Type 2 diabetes mellitus with diabetic peripheral angiopathy without gangrene: Secondary | ICD-10-CM | POA: Diagnosis not present

## 2018-03-17 DIAGNOSIS — E1142 Type 2 diabetes mellitus with diabetic polyneuropathy: Secondary | ICD-10-CM | POA: Diagnosis not present

## 2018-03-17 DIAGNOSIS — E11621 Type 2 diabetes mellitus with foot ulcer: Secondary | ICD-10-CM | POA: Diagnosis not present

## 2018-03-19 DIAGNOSIS — E1151 Type 2 diabetes mellitus with diabetic peripheral angiopathy without gangrene: Secondary | ICD-10-CM | POA: Diagnosis not present

## 2018-03-19 DIAGNOSIS — E1122 Type 2 diabetes mellitus with diabetic chronic kidney disease: Secondary | ICD-10-CM | POA: Diagnosis not present

## 2018-03-19 DIAGNOSIS — E1142 Type 2 diabetes mellitus with diabetic polyneuropathy: Secondary | ICD-10-CM | POA: Diagnosis not present

## 2018-03-19 DIAGNOSIS — L97512 Non-pressure chronic ulcer of other part of right foot with fat layer exposed: Secondary | ICD-10-CM | POA: Diagnosis not present

## 2018-03-19 DIAGNOSIS — E11621 Type 2 diabetes mellitus with foot ulcer: Secondary | ICD-10-CM | POA: Diagnosis not present

## 2018-03-19 DIAGNOSIS — L97529 Non-pressure chronic ulcer of other part of left foot with unspecified severity: Secondary | ICD-10-CM | POA: Diagnosis not present

## 2018-03-22 DIAGNOSIS — L304 Erythema intertrigo: Secondary | ICD-10-CM | POA: Diagnosis not present

## 2018-03-22 DIAGNOSIS — E11621 Type 2 diabetes mellitus with foot ulcer: Secondary | ICD-10-CM | POA: Diagnosis not present

## 2018-03-22 DIAGNOSIS — L309 Dermatitis, unspecified: Secondary | ICD-10-CM | POA: Diagnosis not present

## 2018-03-22 DIAGNOSIS — E1151 Type 2 diabetes mellitus with diabetic peripheral angiopathy without gangrene: Secondary | ICD-10-CM | POA: Diagnosis not present

## 2018-03-22 DIAGNOSIS — L299 Pruritus, unspecified: Secondary | ICD-10-CM | POA: Diagnosis not present

## 2018-03-22 DIAGNOSIS — E1122 Type 2 diabetes mellitus with diabetic chronic kidney disease: Secondary | ICD-10-CM | POA: Diagnosis not present

## 2018-03-22 DIAGNOSIS — L97512 Non-pressure chronic ulcer of other part of right foot with fat layer exposed: Secondary | ICD-10-CM | POA: Diagnosis not present

## 2018-03-22 DIAGNOSIS — E1142 Type 2 diabetes mellitus with diabetic polyneuropathy: Secondary | ICD-10-CM | POA: Diagnosis not present

## 2018-03-22 DIAGNOSIS — L97529 Non-pressure chronic ulcer of other part of left foot with unspecified severity: Secondary | ICD-10-CM | POA: Diagnosis not present

## 2018-03-23 ENCOUNTER — Telehealth (INDEPENDENT_AMBULATORY_CARE_PROVIDER_SITE_OTHER): Payer: Self-pay | Admitting: Orthopedic Surgery

## 2018-03-23 ENCOUNTER — Ambulatory Visit (INDEPENDENT_AMBULATORY_CARE_PROVIDER_SITE_OTHER): Payer: Medicare HMO | Admitting: Orthopedic Surgery

## 2018-03-23 ENCOUNTER — Encounter (INDEPENDENT_AMBULATORY_CARE_PROVIDER_SITE_OTHER): Payer: Self-pay | Admitting: Orthopedic Surgery

## 2018-03-23 VITALS — Ht 59.0 in | Wt 135.0 lb

## 2018-03-23 DIAGNOSIS — L97919 Non-pressure chronic ulcer of unspecified part of right lower leg with unspecified severity: Secondary | ICD-10-CM

## 2018-03-23 DIAGNOSIS — I87331 Chronic venous hypertension (idiopathic) with ulcer and inflammation of right lower extremity: Secondary | ICD-10-CM

## 2018-03-23 DIAGNOSIS — E1142 Type 2 diabetes mellitus with diabetic polyneuropathy: Secondary | ICD-10-CM

## 2018-03-23 DIAGNOSIS — M86271 Subacute osteomyelitis, right ankle and foot: Secondary | ICD-10-CM | POA: Diagnosis not present

## 2018-03-23 DIAGNOSIS — E43 Unspecified severe protein-calorie malnutrition: Secondary | ICD-10-CM

## 2018-03-23 NOTE — Progress Notes (Signed)
Office Visit Note   Patient: Sara Griffith           Date of Birth: [birth year]           MRN: [id number] Visit Date: 03/23/2018              Requested by: Kathyrn Lass, [place], [place] [id number] PCP: Kathyrn Lass, MD  Chief Complaint  Patient presents with  . Right Foot - Wound Check      HPI: Patient is a 82 year old woman who was seen in follow-up for her right foot.  She is approximately 7 months out from the right great toe amputation.  She has been having home health nursing visits every other day she was on doxycycline.  She presents with cellulitis involving the foot with ulcers covering the entire metatarsal heads patient is afebrile.  Assessment & Plan: Visit Diagnoses:  1. Subacute osteomyelitis, right ankle and foot ([place])   2. Idiopathic chronic venous hypertension of right lower extremity with ulcer and inflammation ([facility])   3. Diabetic polyneuropathy associated with type 2 diabetes mellitus ([place])   4. Severe protein-calorie malnutrition ([place])     Plan: Due to the osteomyelitis ulceration cellulitis patient will need to proceed with a transtibial amputation.  I discussed this with the patient and as well as her daughter on the phone.  Her daughter would like to proceed with surgery next Wednesday.  I feel it safe with patient on the doxycycline at this time with the open wounds draining.  Will plan for transtibial amputation of the right with 3 to 5-day admission with discharge to skilled nursing.  The importance of wearing her compression stocking on the left lower extremity was discussed.  Follow-Up Instructions: Return in about 2 weeks (around 04/06/2018).   Ortho Exam  Patient is alert, oriented, no adenopathy, well-dressed, normal affect, normal respiratory effort. Examination patient has cellulitis up to the ankle on the right foot.  She does have a palpable pulse.  There is venous stasis swelling of both lower extremities with venous stasis  ulcers and pitting edema in both legs.  She has a massive ulcer beneath the metatarsal heads of the right foot with exposed bone of the first second third and fourth metatarsal heads.  There is weeping edema from both legs.  Imaging: No results found. No images are attached to the encounter.  Labs: Lab Results  Component Value Date   HGBA1C 7.4 (H) 08/02/2017   HGBA1C 8.0 (H) 10/21/2016   HGBA1C 7.9 (H) 07/03/2016     Lab Results  Component Value Date   ALBUMIN 2.4 (L) 08/03/2017   ALBUMIN 2.6 (L) 08/02/2017   ALBUMIN 3.3 (L) 07/31/2017    Body mass index is 27.27 kg/m.  Orders:  No orders of the defined types were placed in this encounter.  No orders of the defined types were placed in this encounter.    Procedures: No procedures performed  Clinical Data: No additional findings.  ROS:  All other systems negative, except as noted in the HPI. Review of Systems  Objective: Vital Signs: Ht 4\' 11"  (1.499 m)   Wt 135 lb (61.2 kg)   LMP  (LMP Unknown)   BMI 27.27 kg/m   Specialty Comments:  No specialty comments available.  PMFS History: Patient Active Problem List   Diagnosis Date Noted  . Amputated great toe of right foot ([place]) 08/18/2017  . Idiopathic chronic venous hypertension of right lower extremity with ulcer and inflammation (  [place]) 08/18/2017  . Type II diabetes mellitus with renal manifestations ([place]) 08/01/2017  . Fall 08/01/2017  . Left groin pain 08/01/2017  . Rhabdomyolysis 08/01/2017  . History of intracranial hemorrhage 08/01/2017  . Acute renal failure superimposed on stage 3 chronic kidney disease ([place]) 08/01/2017  . Fall at home, initial encounter   . Chest pressure 10/21/2016  . Chest pain at rest   . Hypertensive emergency   . Type 2 diabetes mellitus without complication, without long-term current use of insulin ([place])   . RLS (restless legs syndrome) 07/01/2016  . Cervical lymphadenitis 07/01/2016  . GERD (gastroesophageal reflux  disease) 07/01/2016  . HLD (hyperlipidemia) 07/01/2016  . HTN (hypertension) 07/01/2016  . Lymphadenitis 07/01/2016   Past Medical History:  Diagnosis Date  . Chronic osteomyelitis of toe, right ([place])   . CKD (chronic kidney disease)   . Diabetes mellitus without complication ([place])   . GERD (gastroesophageal reflux disease)   . HLD (hyperlipidemia)   . Hypertension   . IDA (iron deficiency anemia)   . PVD (peripheral vascular disease) ([place])   . RLS (restless legs syndrome)   . TIA (transient ischemic attack)   . Uses walker   . Vitamin D deficiency     Family History  Problem Relation Age of Onset  . Hypertension Mother   . Cancer Father   . Arrhythmia Sister        ppm  . Arrhythmia Brother        ppm    Past Surgical History:  Procedure Laterality Date  . ABDOMINAL HYSTERECTOMY     with bladder tac  . AMPUTATION Right 08/07/2017   Procedure: RIGHT GREAT TOE AMPUTATION AT METATARSOPHALANGEAL JOINT;  Surgeon: Newt Minion, MD;  Location: [facility];  Service: Orthopedics;  Laterality: Right;  . APPENDECTOMY    . CARPAL TUNNEL RELEASE Right   . CATARACT EXTRACTION, BILATERAL    . CERVICAL DISC SURGERY    . COLONOSCOPY    . DILATION AND CURETTAGE OF UTERUS     x 2  . left great toe amputation    . ROTATOR CUFF REPAIR Left    Social History   Occupational History  . Not on file  Tobacco Use  . Smoking status: Former Smoker    Years: 30.00    Types: Cigarettes  . Smokeless tobacco: Former Systems developer    Types: Snuff    Quit date: 07/01/1954  Substance and Sexual Activity  . Alcohol use: No  . Drug use: No  . Sexual activity: Not on file

## 2018-03-23 NOTE — Telephone Encounter (Signed)
Patients daughter called wanting a call back to discuss her appointment this morning as well as the surgery that was mentioned. Requesting a call from Dr. Sharol Given today. Sandra's # [phone]

## 2018-03-23 NOTE — Telephone Encounter (Signed)
Patient's daughter Katharine Look would like a call concerning patient having surgery.  If no answer, please leave a VM.  See message below.  Thank you.

## 2018-03-23 NOTE — Telephone Encounter (Signed)
IC and left message

## 2018-03-24 ENCOUNTER — Other Ambulatory Visit (INDEPENDENT_AMBULATORY_CARE_PROVIDER_SITE_OTHER): Payer: Self-pay | Admitting: Family

## 2018-03-24 DIAGNOSIS — L97529 Non-pressure chronic ulcer of other part of left foot with unspecified severity: Secondary | ICD-10-CM | POA: Diagnosis not present

## 2018-03-24 DIAGNOSIS — E1142 Type 2 diabetes mellitus with diabetic polyneuropathy: Secondary | ICD-10-CM | POA: Diagnosis not present

## 2018-03-24 DIAGNOSIS — L97512 Non-pressure chronic ulcer of other part of right foot with fat layer exposed: Secondary | ICD-10-CM | POA: Diagnosis not present

## 2018-03-24 DIAGNOSIS — E1122 Type 2 diabetes mellitus with diabetic chronic kidney disease: Secondary | ICD-10-CM | POA: Diagnosis not present

## 2018-03-24 DIAGNOSIS — E11621 Type 2 diabetes mellitus with foot ulcer: Secondary | ICD-10-CM | POA: Diagnosis not present

## 2018-03-24 DIAGNOSIS — E1151 Type 2 diabetes mellitus with diabetic peripheral angiopathy without gangrene: Secondary | ICD-10-CM | POA: Diagnosis not present

## 2018-03-25 ENCOUNTER — Telehealth (INDEPENDENT_AMBULATORY_CARE_PROVIDER_SITE_OTHER): Payer: Self-pay

## 2018-03-25 NOTE — Telephone Encounter (Signed)
Melissa with [facility] called wanted to clarify on orders.  I advised patient scheduled for surgery on 07/31

## 2018-03-26 DIAGNOSIS — E1122 Type 2 diabetes mellitus with diabetic chronic kidney disease: Secondary | ICD-10-CM | POA: Diagnosis not present

## 2018-03-26 DIAGNOSIS — L97529 Non-pressure chronic ulcer of other part of left foot with unspecified severity: Secondary | ICD-10-CM | POA: Diagnosis not present

## 2018-03-26 DIAGNOSIS — L97512 Non-pressure chronic ulcer of other part of right foot with fat layer exposed: Secondary | ICD-10-CM | POA: Diagnosis not present

## 2018-03-26 DIAGNOSIS — E1151 Type 2 diabetes mellitus with diabetic peripheral angiopathy without gangrene: Secondary | ICD-10-CM | POA: Diagnosis not present

## 2018-03-26 DIAGNOSIS — E11621 Type 2 diabetes mellitus with foot ulcer: Secondary | ICD-10-CM | POA: Diagnosis not present

## 2018-03-26 DIAGNOSIS — E1142 Type 2 diabetes mellitus with diabetic polyneuropathy: Secondary | ICD-10-CM | POA: Diagnosis not present

## 2018-03-29 ENCOUNTER — Telehealth (INDEPENDENT_AMBULATORY_CARE_PROVIDER_SITE_OTHER): Payer: Self-pay | Admitting: Orthopedic Surgery

## 2018-03-29 DIAGNOSIS — E11621 Type 2 diabetes mellitus with foot ulcer: Secondary | ICD-10-CM | POA: Diagnosis not present

## 2018-03-29 DIAGNOSIS — E1142 Type 2 diabetes mellitus with diabetic polyneuropathy: Secondary | ICD-10-CM | POA: Diagnosis not present

## 2018-03-29 DIAGNOSIS — E1122 Type 2 diabetes mellitus with diabetic chronic kidney disease: Secondary | ICD-10-CM | POA: Diagnosis not present

## 2018-03-29 DIAGNOSIS — L97512 Non-pressure chronic ulcer of other part of right foot with fat layer exposed: Secondary | ICD-10-CM | POA: Diagnosis not present

## 2018-03-29 DIAGNOSIS — E1151 Type 2 diabetes mellitus with diabetic peripheral angiopathy without gangrene: Secondary | ICD-10-CM | POA: Diagnosis not present

## 2018-03-29 DIAGNOSIS — L97529 Non-pressure chronic ulcer of other part of left foot with unspecified severity: Secondary | ICD-10-CM | POA: Diagnosis not present

## 2018-03-29 NOTE — Telephone Encounter (Signed)
Looks like this pt is sch for surgery on Wednesday. Is this correct? I do not see that it has been cancelled.

## 2018-03-29 NOTE — Telephone Encounter (Signed)
Melissa called advised one of the nurses went out and was informed patient's surgery was canceled and she need to confirm. Melissa want to know if there will be new wound orders if the surgery have been canceled. The number to contact Lenna Sciara is [phone]

## 2018-03-30 ENCOUNTER — Encounter (HOSPITAL_COMMUNITY): Payer: Self-pay | Admitting: Anesthesiology

## 2018-03-30 NOTE — Telephone Encounter (Signed)
Surgery was cancelled by patient's family members.  They stated that they needed more time to make arrangements.  They cancelled with pre-admission testing.  I was advised that I could call the patient's daughter tomorrow when she is not working a 12 hr shift like today.

## 2018-03-31 ENCOUNTER — Inpatient Hospital Stay (HOSPITAL_COMMUNITY): Admission: RE | Admit: 2018-03-31 | Payer: Medicare HMO | Source: Ambulatory Visit | Admitting: Orthopedic Surgery

## 2018-03-31 DIAGNOSIS — L03115 Cellulitis of right lower limb: Secondary | ICD-10-CM | POA: Diagnosis not present

## 2018-03-31 DIAGNOSIS — L97512 Non-pressure chronic ulcer of other part of right foot with fat layer exposed: Secondary | ICD-10-CM | POA: Diagnosis not present

## 2018-03-31 DIAGNOSIS — M869 Osteomyelitis, unspecified: Secondary | ICD-10-CM | POA: Diagnosis not present

## 2018-03-31 DIAGNOSIS — E1122 Type 2 diabetes mellitus with diabetic chronic kidney disease: Secondary | ICD-10-CM | POA: Diagnosis not present

## 2018-03-31 DIAGNOSIS — E1142 Type 2 diabetes mellitus with diabetic polyneuropathy: Secondary | ICD-10-CM | POA: Diagnosis not present

## 2018-03-31 DIAGNOSIS — L97529 Non-pressure chronic ulcer of other part of left foot with unspecified severity: Secondary | ICD-10-CM | POA: Diagnosis not present

## 2018-03-31 DIAGNOSIS — L309 Dermatitis, unspecified: Secondary | ICD-10-CM | POA: Diagnosis not present

## 2018-03-31 DIAGNOSIS — L989 Disorder of the skin and subcutaneous tissue, unspecified: Secondary | ICD-10-CM | POA: Diagnosis not present

## 2018-03-31 DIAGNOSIS — E1151 Type 2 diabetes mellitus with diabetic peripheral angiopathy without gangrene: Secondary | ICD-10-CM | POA: Diagnosis not present

## 2018-03-31 DIAGNOSIS — E11621 Type 2 diabetes mellitus with foot ulcer: Secondary | ICD-10-CM | POA: Diagnosis not present

## 2018-03-31 SURGERY — AMPUTATION BELOW KNEE
Anesthesia: General | Laterality: Right

## 2018-03-31 NOTE — Telephone Encounter (Signed)
I called and sw [facility] to advise of message below. Advise to resume prior wound care treatment and sch pending the pt's family rescheduling the surgery. Will call with any questions.

## 2018-04-02 DIAGNOSIS — E1122 Type 2 diabetes mellitus with diabetic chronic kidney disease: Secondary | ICD-10-CM | POA: Diagnosis not present

## 2018-04-02 DIAGNOSIS — L97512 Non-pressure chronic ulcer of other part of right foot with fat layer exposed: Secondary | ICD-10-CM | POA: Diagnosis not present

## 2018-04-02 DIAGNOSIS — E11621 Type 2 diabetes mellitus with foot ulcer: Secondary | ICD-10-CM | POA: Diagnosis not present

## 2018-04-02 DIAGNOSIS — L97529 Non-pressure chronic ulcer of other part of left foot with unspecified severity: Secondary | ICD-10-CM | POA: Diagnosis not present

## 2018-04-02 DIAGNOSIS — E1142 Type 2 diabetes mellitus with diabetic polyneuropathy: Secondary | ICD-10-CM | POA: Diagnosis not present

## 2018-04-02 DIAGNOSIS — E1151 Type 2 diabetes mellitus with diabetic peripheral angiopathy without gangrene: Secondary | ICD-10-CM | POA: Diagnosis not present

## 2018-04-05 ENCOUNTER — Telehealth (INDEPENDENT_AMBULATORY_CARE_PROVIDER_SITE_OTHER): Payer: Self-pay | Admitting: Orthopedic Surgery

## 2018-04-05 DIAGNOSIS — L97529 Non-pressure chronic ulcer of other part of left foot with unspecified severity: Secondary | ICD-10-CM | POA: Diagnosis not present

## 2018-04-05 DIAGNOSIS — E1142 Type 2 diabetes mellitus with diabetic polyneuropathy: Secondary | ICD-10-CM | POA: Diagnosis not present

## 2018-04-05 DIAGNOSIS — E1122 Type 2 diabetes mellitus with diabetic chronic kidney disease: Secondary | ICD-10-CM | POA: Diagnosis not present

## 2018-04-05 DIAGNOSIS — L97512 Non-pressure chronic ulcer of other part of right foot with fat layer exposed: Secondary | ICD-10-CM | POA: Diagnosis not present

## 2018-04-05 DIAGNOSIS — E1151 Type 2 diabetes mellitus with diabetic peripheral angiopathy without gangrene: Secondary | ICD-10-CM | POA: Diagnosis not present

## 2018-04-05 DIAGNOSIS — E11621 Type 2 diabetes mellitus with foot ulcer: Secondary | ICD-10-CM | POA: Diagnosis not present

## 2018-04-05 NOTE — Telephone Encounter (Signed)
I called and left a voicemail for patient's daughter to return my call to discuss surgery.  I have not heard from the patient or her family since they cancelled the below knee amputation that was scheduled for 03/31/2018.

## 2018-04-06 ENCOUNTER — Telehealth (INDEPENDENT_AMBULATORY_CARE_PROVIDER_SITE_OTHER): Payer: Self-pay | Admitting: Orthopedic Surgery

## 2018-04-06 DIAGNOSIS — L309 Dermatitis, unspecified: Secondary | ICD-10-CM | POA: Diagnosis not present

## 2018-04-06 NOTE — Telephone Encounter (Signed)
I spoke with Sara Griffith daughter, Sara Griffith.  She advised that she was trying to make arrangements for her mother's surgery around her work schedule.  She was originally scheduled for 03/31/2018, but the family cancelled surgery.  Sara Griffith is available on 04/23/18.  I advised that if Sara Griffith had any issues in the meantime to please call our office and we could do surgery emergently.  Sara Griffith states that the opposite leg does not Griffith good as well.  Per Sara Griffith the home health nurse is possibly scrubbing the patient's leg too hard when cleaning it and will not put the sock on the patient. Also, patient is almost out of antibiotics (?cephalaxin).  If she needs a refill please send to the [facility] that is listed as her preferred pharmacy.

## 2018-04-06 NOTE — Telephone Encounter (Signed)
Patient does not need additional antibiotics.  Will proceed with surgery as per family's schedule.  Will evaluate the opposite leg at the time of surgery.

## 2018-04-07 ENCOUNTER — Other Ambulatory Visit (INDEPENDENT_AMBULATORY_CARE_PROVIDER_SITE_OTHER): Payer: Self-pay | Admitting: Orthopedic Surgery

## 2018-04-07 DIAGNOSIS — E11621 Type 2 diabetes mellitus with foot ulcer: Secondary | ICD-10-CM | POA: Diagnosis not present

## 2018-04-07 DIAGNOSIS — E1151 Type 2 diabetes mellitus with diabetic peripheral angiopathy without gangrene: Secondary | ICD-10-CM | POA: Diagnosis not present

## 2018-04-07 DIAGNOSIS — E1142 Type 2 diabetes mellitus with diabetic polyneuropathy: Secondary | ICD-10-CM | POA: Diagnosis not present

## 2018-04-07 DIAGNOSIS — M86271 Subacute osteomyelitis, right ankle and foot: Secondary | ICD-10-CM

## 2018-04-07 DIAGNOSIS — L97529 Non-pressure chronic ulcer of other part of left foot with unspecified severity: Secondary | ICD-10-CM | POA: Diagnosis not present

## 2018-04-07 DIAGNOSIS — L97512 Non-pressure chronic ulcer of other part of right foot with fat layer exposed: Secondary | ICD-10-CM | POA: Diagnosis not present

## 2018-04-07 DIAGNOSIS — E1122 Type 2 diabetes mellitus with diabetic chronic kidney disease: Secondary | ICD-10-CM | POA: Diagnosis not present

## 2018-04-07 NOTE — Telephone Encounter (Signed)
I called and left a detailed message for Ms. Inch daughter advising that she did not need any additional antibiotics prior to surgery and also advised that Dr. Sharol Given would examine opposite leg day of surgery.

## 2018-04-10 DIAGNOSIS — E1151 Type 2 diabetes mellitus with diabetic peripheral angiopathy without gangrene: Secondary | ICD-10-CM | POA: Diagnosis not present

## 2018-04-10 DIAGNOSIS — E1142 Type 2 diabetes mellitus with diabetic polyneuropathy: Secondary | ICD-10-CM | POA: Diagnosis not present

## 2018-04-10 DIAGNOSIS — E11621 Type 2 diabetes mellitus with foot ulcer: Secondary | ICD-10-CM | POA: Diagnosis not present

## 2018-04-10 DIAGNOSIS — E1122 Type 2 diabetes mellitus with diabetic chronic kidney disease: Secondary | ICD-10-CM | POA: Diagnosis not present

## 2018-04-10 DIAGNOSIS — L97529 Non-pressure chronic ulcer of other part of left foot with unspecified severity: Secondary | ICD-10-CM | POA: Diagnosis not present

## 2018-04-10 DIAGNOSIS — L97512 Non-pressure chronic ulcer of other part of right foot with fat layer exposed: Secondary | ICD-10-CM | POA: Diagnosis not present

## 2018-04-11 ENCOUNTER — Emergency Department (HOSPITAL_COMMUNITY): Payer: Medicare HMO

## 2018-04-11 ENCOUNTER — Other Ambulatory Visit: Payer: Self-pay

## 2018-04-11 ENCOUNTER — Inpatient Hospital Stay (HOSPITAL_COMMUNITY)
Admission: EM | Admit: 2018-04-11 | Discharge: 2018-04-19 | DRG: 616 | Disposition: A | Discharge status: Skilled Nursing Facility | Payer: Medicare HMO | Attending: Internal Medicine | Admitting: Internal Medicine

## 2018-04-11 DIAGNOSIS — Y92129 Unspecified place in nursing home as the place of occurrence of the external cause: Secondary | ICD-10-CM | POA: Diagnosis not present

## 2018-04-11 DIAGNOSIS — E1140 Type 2 diabetes mellitus with diabetic neuropathy, unspecified: Secondary | ICD-10-CM | POA: Diagnosis present

## 2018-04-11 DIAGNOSIS — E785 Hyperlipidemia, unspecified: Secondary | ICD-10-CM | POA: Diagnosis present

## 2018-04-11 DIAGNOSIS — S3993XA Unspecified injury of pelvis, initial encounter: Secondary | ICD-10-CM | POA: Diagnosis not present

## 2018-04-11 DIAGNOSIS — M6281 Muscle weakness (generalized): Secondary | ICD-10-CM | POA: Diagnosis not present

## 2018-04-11 DIAGNOSIS — E11621 Type 2 diabetes mellitus with foot ulcer: Secondary | ICD-10-CM | POA: Diagnosis present

## 2018-04-11 DIAGNOSIS — R102 Pelvic and perineal pain: Secondary | ICD-10-CM | POA: Diagnosis not present

## 2018-04-11 DIAGNOSIS — L03115 Cellulitis of right lower limb: Secondary | ICD-10-CM | POA: Diagnosis present

## 2018-04-11 DIAGNOSIS — I129 Hypertensive chronic kidney disease with stage 1 through stage 4 chronic kidney disease, or unspecified chronic kidney disease: Secondary | ICD-10-CM | POA: Diagnosis present

## 2018-04-11 DIAGNOSIS — S299XXA Unspecified injury of thorax, initial encounter: Secondary | ICD-10-CM | POA: Diagnosis not present

## 2018-04-11 DIAGNOSIS — Z7401 Bed confinement status: Secondary | ICD-10-CM | POA: Diagnosis not present

## 2018-04-11 DIAGNOSIS — Z8673 Personal history of transient ischemic attack (TIA), and cerebral infarction without residual deficits: Secondary | ICD-10-CM | POA: Diagnosis not present

## 2018-04-11 DIAGNOSIS — E119 Type 2 diabetes mellitus without complications: Secondary | ICD-10-CM

## 2018-04-11 DIAGNOSIS — K219 Gastro-esophageal reflux disease without esophagitis: Secondary | ICD-10-CM | POA: Diagnosis present

## 2018-04-11 DIAGNOSIS — I70261 Atherosclerosis of native arteries of extremities with gangrene, right leg: Secondary | ICD-10-CM | POA: Diagnosis not present

## 2018-04-11 DIAGNOSIS — W19XXXA Unspecified fall, initial encounter: Secondary | ICD-10-CM | POA: Diagnosis not present

## 2018-04-11 DIAGNOSIS — R278 Other lack of coordination: Secondary | ICD-10-CM | POA: Diagnosis not present

## 2018-04-11 DIAGNOSIS — R2689 Other abnormalities of gait and mobility: Secondary | ICD-10-CM | POA: Diagnosis not present

## 2018-04-11 DIAGNOSIS — G2581 Restless legs syndrome: Secondary | ICD-10-CM | POA: Diagnosis present

## 2018-04-11 DIAGNOSIS — E860 Dehydration: Secondary | ICD-10-CM

## 2018-04-11 DIAGNOSIS — E1169 Type 2 diabetes mellitus with other specified complication: Secondary | ICD-10-CM | POA: Diagnosis present

## 2018-04-11 DIAGNOSIS — W1839XA Other fall on same level, initial encounter: Secondary | ICD-10-CM | POA: Diagnosis present

## 2018-04-11 DIAGNOSIS — Z79899 Other long term (current) drug therapy: Secondary | ICD-10-CM

## 2018-04-11 DIAGNOSIS — G8918 Other acute postprocedural pain: Secondary | ICD-10-CM | POA: Diagnosis not present

## 2018-04-11 DIAGNOSIS — E1152 Type 2 diabetes mellitus with diabetic peripheral angiopathy with gangrene: Secondary | ICD-10-CM | POA: Diagnosis present

## 2018-04-11 DIAGNOSIS — M869 Osteomyelitis, unspecified: Secondary | ICD-10-CM | POA: Diagnosis not present

## 2018-04-11 DIAGNOSIS — M2550 Pain in unspecified joint: Secondary | ICD-10-CM | POA: Diagnosis not present

## 2018-04-11 DIAGNOSIS — Z87891 Personal history of nicotine dependence: Secondary | ICD-10-CM

## 2018-04-11 DIAGNOSIS — M86271 Subacute osteomyelitis, right ankle and foot: Secondary | ICD-10-CM | POA: Diagnosis present

## 2018-04-11 DIAGNOSIS — L98499 Non-pressure chronic ulcer of skin of other sites with unspecified severity: Secondary | ICD-10-CM | POA: Diagnosis present

## 2018-04-11 DIAGNOSIS — D509 Iron deficiency anemia, unspecified: Secondary | ICD-10-CM | POA: Diagnosis not present

## 2018-04-11 DIAGNOSIS — I10 Essential (primary) hypertension: Secondary | ICD-10-CM | POA: Diagnosis present

## 2018-04-11 DIAGNOSIS — Z743 Need for continuous supervision: Secondary | ICD-10-CM | POA: Diagnosis not present

## 2018-04-11 DIAGNOSIS — S0993XA Unspecified injury of face, initial encounter: Secondary | ICD-10-CM | POA: Diagnosis not present

## 2018-04-11 DIAGNOSIS — Z66 Do not resuscitate: Secondary | ICD-10-CM | POA: Diagnosis present

## 2018-04-11 DIAGNOSIS — N183 Chronic kidney disease, stage 3 (moderate): Secondary | ICD-10-CM | POA: Diagnosis present

## 2018-04-11 DIAGNOSIS — E43 Unspecified severe protein-calorie malnutrition: Secondary | ICD-10-CM | POA: Diagnosis present

## 2018-04-11 DIAGNOSIS — E1122 Type 2 diabetes mellitus with diabetic chronic kidney disease: Secondary | ICD-10-CM | POA: Diagnosis present

## 2018-04-11 DIAGNOSIS — L03119 Cellulitis of unspecified part of limb: Secondary | ICD-10-CM | POA: Diagnosis not present

## 2018-04-11 DIAGNOSIS — R279 Unspecified lack of coordination: Secondary | ICD-10-CM | POA: Diagnosis not present

## 2018-04-11 DIAGNOSIS — S0990XA Unspecified injury of head, initial encounter: Secondary | ICD-10-CM | POA: Diagnosis not present

## 2018-04-11 DIAGNOSIS — Z888 Allergy status to other drugs, medicaments and biological substances status: Secondary | ICD-10-CM

## 2018-04-11 DIAGNOSIS — Z6826 Body mass index (BMI) 26.0-26.9, adult: Secondary | ICD-10-CM

## 2018-04-11 DIAGNOSIS — L97519 Non-pressure chronic ulcer of other part of right foot with unspecified severity: Secondary | ICD-10-CM | POA: Diagnosis present

## 2018-04-11 DIAGNOSIS — E1129 Type 2 diabetes mellitus with other diabetic kidney complication: Secondary | ICD-10-CM | POA: Diagnosis not present

## 2018-04-11 DIAGNOSIS — S199XXA Unspecified injury of neck, initial encounter: Secondary | ICD-10-CM | POA: Diagnosis not present

## 2018-04-11 DIAGNOSIS — D631 Anemia in chronic kidney disease: Secondary | ICD-10-CM | POA: Diagnosis present

## 2018-04-11 DIAGNOSIS — M86471 Chronic osteomyelitis with draining sinus, right ankle and foot: Secondary | ICD-10-CM | POA: Diagnosis not present

## 2018-04-11 DIAGNOSIS — S98111A Complete traumatic amputation of right great toe, initial encounter: Secondary | ICD-10-CM | POA: Diagnosis not present

## 2018-04-11 DIAGNOSIS — L98492 Non-pressure chronic ulcer of skin of other sites with fat layer exposed: Secondary | ICD-10-CM | POA: Diagnosis not present

## 2018-04-11 DIAGNOSIS — L0390 Cellulitis, unspecified: Secondary | ICD-10-CM | POA: Diagnosis present

## 2018-04-11 LAB — COMPREHENSIVE METABOLIC PANEL
ALK PHOS: 102 U/L (ref 38–126)
ALT: 20 U/L (ref 0–44)
AST: 28 U/L (ref 15–41)
Albumin: 2.3 g/dL — ABNORMAL LOW (ref 3.5–5.0)
Anion gap: 13 (ref 5–15)
BILIRUBIN TOTAL: 0.6 mg/dL (ref 0.3–1.2)
BUN: 15 mg/dL (ref 8–23)
CALCIUM: 9.1 mg/dL (ref 8.9–10.3)
CO2: 25 mmol/L (ref 22–32)
Chloride: 99 mmol/L (ref 98–111)
Creatinine, Ser: 1.11 mg/dL — ABNORMAL HIGH (ref 0.44–1.00)
GFR calc non Af Amer: 46 mL/min — ABNORMAL LOW (ref >60)
GFR, EST AFRICAN AMERICAN: 53 mL/min — AB (ref >60)
Glucose, Bld: 205 mg/dL — ABNORMAL HIGH (ref 70–99)
POTASSIUM: 3.8 mmol/L (ref 3.5–5.1)
Sodium: 137 mmol/L (ref 135–145)
TOTAL PROTEIN: 7.7 g/dL (ref 6.5–8.1)

## 2018-04-11 LAB — CBC WITH DIFFERENTIAL/PLATELET
Abs Immature Granulocytes: 0.1 10*3/uL (ref 0.0–0.1)
BASOS ABS: 0 10*3/uL (ref 0.0–0.1)
Basophils Relative: 0 %
EOS ABS: 0.3 10*3/uL (ref 0.0–0.7)
EOS PCT: 2 %
HEMATOCRIT: 30.3 % — AB (ref 36.0–46.0)
HEMOGLOBIN: 9.3 g/dL — AB (ref 12.0–15.0)
Immature Granulocytes: 1 %
LYMPHS PCT: 10 %
Lymphs Abs: 1.6 10*3/uL (ref 0.7–4.0)
MCH: 26.6 pg (ref 26.0–34.0)
MCHC: 30.7 g/dL (ref 30.0–36.0)
MCV: 86.6 fL (ref 78.0–100.0)
MONO ABS: 0.7 10*3/uL (ref 0.1–1.0)
Monocytes Relative: 5 %
Neutro Abs: 12.6 10*3/uL — ABNORMAL HIGH (ref 1.7–7.7)
Neutrophils Relative %: 82 %
Platelets: 444 10*3/uL — ABNORMAL HIGH (ref 150–400)
RBC: 3.5 MIL/uL — ABNORMAL LOW (ref 3.87–5.11)
RDW: 16.2 % — AB (ref 11.5–15.5)
WBC: 15.3 10*3/uL — AB (ref 4.0–10.5)

## 2018-04-11 LAB — I-STAT TROPONIN, ED: TROPONIN I, POC: 0.03 ng/mL (ref 0.00–0.08)

## 2018-04-11 LAB — CK: Total CK: 215 U/L (ref 38–234)

## 2018-04-11 MED ORDER — SODIUM CHLORIDE 0.9 % IV BOLUS
1000.0000 mL | Freq: Once | INTRAVENOUS | Status: AC
  Administered 2018-04-11: 1000 mL via INTRAVENOUS

## 2018-04-11 NOTE — ED Provider Notes (Addendum)
[facility] EMERGENCY DEPARTMENT Provider Note   CSN: [id number] Arrival date & time: 04/11/18  1957     History   Chief Complaint Chief Complaint  Patient presents with  . Fall    HPI Sara Griffith is a 82 y.o. female hx of CKD, chronic osteomyelitis of the right foot, hypertension, TIA here presenting with fall.  Patient states that she lives in the independent living and her legs gave out and she hit her lip and her head afterwards.  She states that she was on the floor until this evening when she was found by the staff.  Baseline, patient does ambulate with a walker.  Did not take her daily meds today.  Patient scheduled for amputation of the right foot later this month.   The history is provided by the patient.    Past Medical History:  Diagnosis Date  . Chronic osteomyelitis of toe, right ([place])   . CKD (chronic kidney disease)   . Diabetes mellitus without complication ([place])   . GERD (gastroesophageal reflux disease)   . HLD (hyperlipidemia)   . Hypertension   . IDA (iron deficiency anemia)   . PVD (peripheral vascular disease) ([place])   . RLS (restless legs syndrome)   . TIA (transient ischemic attack)   . Uses walker   . Vitamin D deficiency     Patient Active Problem List   Diagnosis Date Noted  . Osteomyelitis of right foot ([place])   . Cellulitis 04/12/2018  . Skin ulcer ([place]) 04/12/2018  . Amputated great toe of right foot ([place]) 08/18/2017  . Idiopathic chronic venous hypertension of right lower extremity with ulcer and inflammation ([place]) 08/18/2017  . Type II diabetes mellitus with renal manifestations ([place]) 08/01/2017  . Fall 08/01/2017  . Left groin pain 08/01/2017  . Rhabdomyolysis 08/01/2017  . History of intracranial hemorrhage 08/01/2017  . Acute renal failure superimposed on stage 3 chronic kidney disease ([place]) 08/01/2017  . Fall at home, initial encounter   . Chest pressure 10/21/2016  . Chest pain at rest   . Hypertensive  emergency   . Type 2 diabetes mellitus without complication, without long-term current use of insulin ([place])   . RLS (restless legs syndrome) 07/01/2016  . Cervical lymphadenitis 07/01/2016  . GERD (gastroesophageal reflux disease) 07/01/2016  . HLD (hyperlipidemia) 07/01/2016  . HTN (hypertension) 07/01/2016  . Lymphadenitis 07/01/2016    Past Surgical History:  Procedure Laterality Date  . ABDOMINAL HYSTERECTOMY     with bladder tac  . AMPUTATION Right 08/07/2017   Procedure: RIGHT GREAT TOE AMPUTATION AT METATARSOPHALANGEAL JOINT;  Surgeon: Newt Minion, MD;  Location: [facility];  Service: Orthopedics;  Laterality: Right;  . APPENDECTOMY    . CARPAL TUNNEL RELEASE Right   . CATARACT EXTRACTION, BILATERAL    . CERVICAL DISC SURGERY    . COLONOSCOPY    . DILATION AND CURETTAGE OF UTERUS     x 2  . left great toe amputation    . ROTATOR CUFF REPAIR Left      OB History   None      Home Medications    Prior to Admission medications   Medication Sig Start Date End Date Taking? Authorizing Provider  amLODipine (NORVASC) 5 MG tablet Take 1 tablet (5 mg total) by mouth daily. Please make overdue yearly appt with Dr. Burt Knack before anymore refills. 2nd attempt Patient taking differently: Take 5 mg by mouth daily.  11/09/17  Yes Bhagat, Bhavinkumar, PA  atorvastatin (  LIPITOR) 40 MG tablet Take 40 mg by mouth at bedtime.    Yes [provider]  cholecalciferol (VITAMIN D) 1000 units tablet Take 1,000 Units by mouth daily.   Yes [provider]  linagliptin (TRADJENTA) 5 MG TABS tablet Take 5 mg by mouth daily.   Yes [provider]  losartan (COZAAR) 50 MG tablet Take 1 tablet (50 mg total) by mouth daily. 08/10/17  Yes Dessa Phi, DO  metoprolol tartrate (LOPRESSOR) 50 MG tablet Take 1 tablet (50 mg total) by mouth 2 (two) times daily with a meal. 07/04/16  Yes Eugenie Filler, MD  omega-3 acid ethyl esters (LOVAZA) 1 g capsule Take 1 g by mouth 3  (three) times daily.  06/28/16  Yes [provider]  omeprazole (PRILOSEC) 40 MG capsule Take 40 mg by mouth daily. 06/28/16  Yes [provider]  pramipexole (MIRAPEX) 0.5 MG tablet Take 0.5 mg by mouth at bedtime.   Yes [provider]  traMADol (ULTRAM) 50 MG tablet Take 50 mg by mouth 2 (two) times daily.  06/05/16  Yes [provider]  vitamin E 400 UNIT capsule Take 400 Units by mouth daily.   Yes [provider]    Family History Family History  Problem Relation Age of Onset  . Hypertension Mother   . Cancer Father   . Arrhythmia Sister        ppm  . Arrhythmia Brother        ppm    Social History Social History   Tobacco Use  . Smoking status: Former Smoker    Years: 30.00    Types: Cigarettes  . Smokeless tobacco: Former Systems developer    Types: Snuff    Quit date: 07/01/1954  Substance Use Topics  . Alcohol use: No  . Drug use: No     Allergies   Cymbalta [duloxetine hcl]; Librax [chlordiazepoxide-clidinium]; Lyrica [pregabalin]; and Naproxen   Review of Systems Review of Systems  Skin: Positive for wound.  All other systems reviewed and are negative.    Physical Exam Updated Vital Signs BP 128/63   Pulse 85   Temp 99.2 F (37.3 C)   Resp 20   Ht 4\' 11"  (1.499 m)   Wt 59.3 kg   LMP  (LMP Unknown)   SpO2 90%   BMI 26.40 kg/m   Physical Exam  Constitutional:  Chronically ill, dehydrated   HENT:  Head: Normocephalic.  MM slightly dry. Abrasion R upper lip.   Eyes: Pupils are equal, round, and reactive to light. Conjunctivae and EOM are normal.  Neck: Normal range of motion. Neck supple.  Cardiovascular: Normal rate, regular rhythm and normal heart sounds.  Pulmonary/Chest: Effort normal and breath sounds normal.  Abdominal: Soft. Bowel sounds are normal. She exhibits no distension. There is no tenderness. There is no guarding.  Musculoskeletal: Normal range of motion.  No spinal tenderness. Nl ROM  bilateral hips. R foot with known stage 4 ulcer (chronic)   Neurological: She is alert.  Skin: Skin is warm.  Psychiatric: She has a normal mood and affect.  Nursing note and vitals reviewed.    ED Treatments / Results  Labs (all labs ordered are listed, but only abnormal results are displayed) Labs Reviewed  SURGICAL PCR SCREEN - Abnormal; Notable for the following components:      Result Value   Staphylococcus aureus POSITIVE (*)    All other components within normal limits  CBC WITH DIFFERENTIAL/PLATELET - Abnormal; Notable for  the following components:   WBC 15.3 (*)    RBC 3.50 (*)    Hemoglobin 9.3 (*)    HCT 30.3 (*)    RDW 16.2 (*)    Platelets 444 (*)    Neutro Abs 12.6 (*)    All other components within normal limits  COMPREHENSIVE METABOLIC PANEL - Abnormal; Notable for the following components:   Glucose, Bld 205 (*)    Creatinine, Ser 1.11 (*)    Albumin 2.3 (*)    GFR calc non Af Amer 46 (*)    GFR calc Af Amer 53 (*)    All other components within normal limits  URINALYSIS, ROUTINE W REFLEX MICROSCOPIC - Abnormal; Notable for the following components:   Color, Urine STRAW (*)    Protein, ur 30 (*)    All other components within normal limits  SEDIMENTATION RATE - Abnormal; Notable for the following components:   Sed Rate 126 (*)    All other components within normal limits  TROPONIN I - Abnormal; Notable for the following components:   Troponin I 0.03 (*)    All other components within normal limits  D-DIMER, QUANTITATIVE (NOT AT [facility]) - Abnormal; Notable for the following components:   D-Dimer, Quant 10.28 (*)    All other components within normal limits  COMPREHENSIVE METABOLIC PANEL - Abnormal; Notable for the following components:   Potassium 3.3 (*)    Glucose, Bld 107 (*)    Creatinine, Ser 1.03 (*)    Calcium 8.0 (*)    Total Protein 6.1 (*)    Albumin 1.8 (*)    GFR calc non Af Amer 50 (*)    GFR calc Af Amer 58 (*)    All other components  within normal limits  CBC - Abnormal; Notable for the following components:   WBC 12.4 (*)    RBC 3.34 (*)    Hemoglobin 8.8 (*)    HCT 28.5 (*)    RDW 16.2 (*)    All other components within normal limits  GLUCOSE, CAPILLARY - Abnormal; Notable for the following components:   Glucose-Capillary 265 (*)    All other components within normal limits  GLUCOSE, CAPILLARY - Abnormal; Notable for the following components:   Glucose-Capillary 152 (*)    All other components within normal limits  GLUCOSE, CAPILLARY - Abnormal; Notable for the following components:   Glucose-Capillary 107 (*)    All other components within normal limits  GLUCOSE, CAPILLARY - Abnormal; Notable for the following components:   Glucose-Capillary 112 (*)    All other components within normal limits  CBC - Abnormal; Notable for the following components:   WBC 11.5 (*)    RBC 3.32 (*)    Hemoglobin 8.7 (*)    HCT 28.6 (*)    RDW 16.5 (*)    All other components within normal limits  GLUCOSE, CAPILLARY - Abnormal; Notable for the following components:   Glucose-Capillary 177 (*)    All other components within normal limits  GLUCOSE, CAPILLARY - Abnormal; Notable for the following components:   Glucose-Capillary 238 (*)    All other components within normal limits  GLUCOSE, CAPILLARY - Abnormal; Notable for the following components:   Glucose-Capillary 240 (*)    All other components within normal limits  GLUCOSE, CAPILLARY - Abnormal; Notable for the following components:   Glucose-Capillary 200 (*)    All other components within normal limits  CULTURE, BLOOD (ROUTINE X 2)  CULTURE, BLOOD (ROUTINE X  2)  AEROBIC CULTURE (SUPERFICIAL SPECIMEN)  CK  TROPONIN I  PROTIME-INR  I-STAT TROPONIN, ED    EKG EKG Interpretation  Date/Time:  Monday April 12 2018 00:29:29 EDT Ventricular Rate:  102 PR Interval:    QRS Duration: 93 QT Interval:  332 QTC Calculation: 433 R Axis:   43 Text Interpretation:   Fast sinus arrhythmia Abnormal inferior Q waves Nonspecific ST depression Confirmed by Ripley Fraise [phone] on 04/12/2018 12:39:50 AM   Radiology Dg Chest 2 View  Result Date: 04/11/2018 CLINICAL DATA:  Un witnessed fall today EXAM: CHEST - 2 VIEW COMPARISON:  08/09/2017 FINDINGS: Cardiac shadows within normal limits. No focal infiltrate or sizable effusion is seen. The lungs are well aerated with mild interstitial changes stable from the prior study. No acute bony abnormality is seen. Chronic compression deformity in the lower thoracic/upper lumbar spine is seen. IMPRESSION: Chronic changes without acute abnormality. Electronically Signed   By: Inez Catalina M.D.   On: 04/11/2018 20:54   Dg Pelvis 1-2 Views  Result Date: 04/11/2018 CLINICAL DATA:  Recent fall with pelvic pain, initial encounter EXAM: PELVIS - 1-2 VIEW COMPARISON:  08/01/2017 FINDINGS: Pelvic ring is intact. No acute fracture or dislocation is seen. Mild degenerative changes of the hip joints are noted bilaterally. IMPRESSION: No acute abnormality noted. Electronically Signed   By: Inez Catalina M.D.   On: 04/11/2018 20:55   Ct Head Wo Contrast  Result Date: 04/11/2018 CLINICAL DATA:  Head trauma. Fall this morning. EXAM: CT HEAD WITHOUT CONTRAST CT CERVICAL SPINE WITHOUT CONTRAST TECHNIQUE: Multidetector CT imaging of the head and cervical spine was performed following the standard protocol without intravenous contrast. Multiplanar CT image reconstructions of the cervical spine were also generated. COMPARISON:  08/02/2017 FINDINGS: CT HEAD FINDINGS Brain: No evidence of acute infarction, hemorrhage, hydrocephalus, extra-axial collection or mass lesion/mass effect. There is mild diffuse low-attenuation within the subcortical and periventricular white matter compatible with chronic microvascular disease. Prominence of the sulci and ventricles compatible with brain atrophy. Vascular: No hyperdense vessel or unexpected calcification.  Skull: Normal. Negative for fracture or focal lesion. Sinuses/Orbits: No acute finding. Other: None. CT CERVICAL SPINE FINDINGS Alignment: There is reversal of normal cervical lordosis. There is an anterolisthesis of C2 on C3 measuring 4 mm. Skull base and vertebrae: No acute fracture. No primary bone lesion or focal pathologic process. Soft tissues and spinal canal: No prevertebral fluid or swelling. No visible canal hematoma. Disc levels: Previous posterior laminectomy and decompression of C3 through C7. Solid fusion of C5 through C7 noted. Marked degenerative disc disease noted at C3-4, C4-5 and C7-T1. Upper chest: Calcified granuloma noted in the medial right upper lobe. Other: None IMPRESSION: 1. No acute intracranial abnormalities. Chronic small vessel ischemic disease and brain atrophy. 2. No evidence of acute cervical spine fracture. 3. Multilevel degenerative disc disease and cervical spondylosis, as above. 4. Extensive cervical spondylosis is identified status post posterior decompression of C3 through C7. There is an anterolisthesis of C2 on C3 which is felt to likely reflect changes secondary to chronic spondylosis. Electronically Signed   By: Kerby Moors M.D.   On: 04/11/2018 21:23   Ct Cervical Spine Wo Contrast  Result Date: 04/11/2018 CLINICAL DATA:  Head trauma. Fall this morning. EXAM: CT HEAD WITHOUT CONTRAST CT CERVICAL SPINE WITHOUT CONTRAST TECHNIQUE: Multidetector CT imaging of the head and cervical spine was performed following the standard protocol without intravenous contrast. Multiplanar CT image reconstructions of the cervical spine were also generated. COMPARISON:  08/02/2017 FINDINGS: CT HEAD FINDINGS Brain: No evidence of acute infarction, hemorrhage, hydrocephalus, extra-axial collection or mass lesion/mass effect. There is mild diffuse low-attenuation within the subcortical and periventricular white matter compatible with chronic microvascular disease. Prominence of the sulci  and ventricles compatible with brain atrophy. Vascular: No hyperdense vessel or unexpected calcification. Skull: Normal. Negative for fracture or focal lesion. Sinuses/Orbits: No acute finding. Other: None. CT CERVICAL SPINE FINDINGS Alignment: There is reversal of normal cervical lordosis. There is an anterolisthesis of C2 on C3 measuring 4 mm. Skull base and vertebrae: No acute fracture. No primary bone lesion or focal pathologic process. Soft tissues and spinal canal: No prevertebral fluid or swelling. No visible canal hematoma. Disc levels: Previous posterior laminectomy and decompression of C3 through C7. Solid fusion of C5 through C7 noted. Marked degenerative disc disease noted at C3-4, C4-5 and C7-T1. Upper chest: Calcified granuloma noted in the medial right upper lobe. Other: None IMPRESSION: 1. No acute intracranial abnormalities. Chronic small vessel ischemic disease and brain atrophy. 2. No evidence of acute cervical spine fracture. 3. Multilevel degenerative disc disease and cervical spondylosis, as above. 4. Extensive cervical spondylosis is identified status post posterior decompression of C3 through C7. There is an anterolisthesis of C2 on C3 which is felt to likely reflect changes secondary to chronic spondylosis. Electronically Signed   By: Kerby Moors M.D.   On: 04/11/2018 21:23    Procedures Procedures (including critical care time)  Angiocath insertion Performed by: Wandra Arthurs  Consent: Verbal consent obtained. Risks and benefits: risks, benefits and alternatives were discussed Time out: Immediately prior to procedure a "time out" was called to verify the correct patient, procedure, equipment, support staff and site/side marked as required.  Preparation: Patient was prepped and draped in the usual sterile fashion.  Vein Location: L brachial  Ultrasound Guided  Gauge: 20 long  Normal blood return and flush without difficulty Patient tolerance: Patient tolerated the  procedure well with no immediate complications.     Medications Ordered in ED Medications  insulin aspart (novoLOG) injection 0-9 Units (2 Units Subcutaneous Given 04/13/18 0808)  traMADol (ULTRAM) tablet 50-100 mg (has no administration in time range)  amLODipine (NORVASC) tablet 5 mg (5 mg Oral Given 04/13/18 0809)  atorvastatin (LIPITOR) tablet 40 mg (40 mg Oral Given 04/13/18 0808)  losartan (COZAAR) tablet 50 mg (50 mg Oral Given 04/13/18 0808)  metoprolol tartrate (LOPRESSOR) tablet 50 mg (50 mg Oral Given 04/13/18 0808)  pantoprazole (PROTONIX) EC tablet 40 mg (40 mg Oral Given 04/13/18 0808)  psyllium (HYDROCIL/METAMUCIL) packet 1 packet (has no administration in time range)  folic acid (FOLVITE) tablet 0.5 mg (0.5 mg Oral Given 04/13/18 0809)  pramipexole (MIRAPEX) tablet 0.5 mg (0.5 mg Oral Given 04/12/18 2139)  cholecalciferol (VITAMIN D) tablet 1,000 Units (1,000 Units Oral Given 04/13/18 0808)  0.9 %  sodium chloride infusion ( Intravenous Rate/Dose Change 04/12/18 1447)  acetaminophen (TYLENOL) tablet 650 mg (has no administration in time range)    Or  acetaminophen (TYLENOL) suppository 650 mg (has no administration in time range)  feeding supplement (PRO-STAT SUGAR FREE 64) liquid 30 mL (30 mLs Oral Given 04/13/18 0809)  piperacillin-tazobactam (ZOSYN) IVPB 3.375 g (3.375 g Intravenous New Bag/Given 04/13/18 0516)  vancomycin (VANCOCIN) 500 mg in sodium chloride 0.9 % 100 mL IVPB (500 mg Intravenous New Bag/Given 04/13/18 0517)  sodium chloride 0.9 % bolus 1,000 mL (0 mLs Intravenous Stopped 04/11/18 2326)  sodium chloride 0.9 % bolus 500 mL (0 mLs  Intravenous Stopped 04/12/18 0141)  metoprolol tartrate (LOPRESSOR) tablet 50 mg (50 mg Oral Given 04/12/18 0202)  vancomycin (VANCOCIN) IVPB 1000 mg/200 mL premix (1,000 mg Intravenous Transfusing/Transfer 04/12/18 0428)  piperacillin-tazobactam (ZOSYN) IVPB 3.375 g (3.375 g Intravenous Transfusing/Transfer 04/12/18 0428)     Initial  Impression / Assessment and Plan / ED Course  I have reviewed the triage vital signs and the nursing notes.  Pertinent labs & imaging results that were available during my care of the patient were reviewed by me and considered in my medical decision making (see chart for details).    Ceaira Fiebelkorn is a 82 y.o. female here with fall. Patient has been on the floor since this morning. Consider rhabdo. Will also get CT head/neck, xrays. Will hydrate and reassess.  10 pm Labs showed WBC 15. CK and CMP pending. Given IVF. Signed out to overnight PA to follow up labs. If CK not elevated and patient can ambulate with assistance, anticipate dc back to facility.   Final Clinical Impressions(s) / ED Diagnoses   Final diagnoses:  Fall, initial encounter  Osteomyelitis of right foot, unspecified type [facility])  Dehydration    ED Discharge Orders    None       Drenda Freeze, MD 04/11/18 2204    Drenda Freeze, MD 04/13/18 [phone]

## 2018-04-11 NOTE — ED Triage Notes (Signed)
Pt arrives today from [facility] independent living facility, fell this AM around 0800 and was found by a friend around 1730. No visible injuries except bruised lip, alert/oriented x4. Pt usually ambulates with a walker.  Was unable to take any daily meds; PTAR vitals 150/756; 100 NSR; 98% RA

## 2018-04-11 NOTE — ED Provider Notes (Addendum)
Care assumed from Dr. Darl Householder at shift change, please see his note for full details, but in brief Sara Griffith is a 82 y.o. female who presents from an independent living facility after a mechanical fall.  [place] this morning but was not found until around 6 or 7:00 this evening.  CT of the head neck as well as x-rays of the chest and pelvis were unremarkable.  Care signed out pending lab evaluation.  Patient has had a history of rhabdomyolysis in the past, so we will check CK level given that patient was found on the floor after several hours.  Patient has been afebrile and mildly tachycardic.  Patient does have history of chronic osteo, and is scheduled for surgery with Dr. Sharol Given later this month.  Prior provider felt that this was stable.  Appears somewhat dehydrated, IV fluid was ordered as well.  Plan: f/u lab work, if unremarkable D/C back to facility  Labs Reviewed  CBC WITH DIFFERENTIAL/PLATELET - Abnormal; Notable for the following components:      Result Value   WBC 15.3 (*)    RBC 3.50 (*)    Hemoglobin 9.3 (*)    HCT 30.3 (*)    RDW 16.2 (*)    Platelets 444 (*)    Neutro Abs 12.6 (*)    All other components within normal limits  COMPREHENSIVE METABOLIC PANEL - Abnormal; Notable for the following components:   Glucose, Bld 205 (*)    Creatinine, Ser 1.11 (*)    Albumin 2.3 (*)    GFR calc non Af Amer 46 (*)    GFR calc Af Amer 53 (*)    All other components within normal limits  URINALYSIS, ROUTINE W REFLEX MICROSCOPIC - Abnormal; Notable for the following components:   Color, Urine STRAW (*)    Protein, ur 30 (*)    All other components within normal limits  CK  I-STAT TROPONIN, ED   Vitals:   04/12/18 0130 04/12/18 0140 04/12/18 0142 04/12/18 0202  BP: (!) 145/48   (!) 143/59  Pulse:  (!) 113  (!) 122  Resp: (!) 24 (!) 25    Temp:   98.8 F (37.1 C)   TempSrc:   Oral   SpO2:  94%    Weight:      Height:        Labs significant for leukocytosis of 15.3, hemoglobin is  stable, glucose elevated at 205, no other acute electrolyte derangements, creatinine is at baseline.  Urinalysis without signs of infection.  CK unremarkable.  Negative troponin.  EKG shows mild tachycardia without any concerning ischemic changes.  Chest x-ray without any active cardiopulmonary disease, CT head, and cervical spine as well as plain films of the pelvis look good.  Patient has continued to be mildly tachycardic, with ambulation she became tachycardic to the 120s, which may be orthostatic.  Will give additional 500 cc of fluid.  Despite leukocytosis of 15.3 no signs of infection in the urine or or pneumonia on chest x-ray.  Abdominal exam is benign.  Patient's daughter has arrived at the bedside and does express concern that her legs look worse with increasing redness and warmth compared to when she last saw her.  Foot with wound over the plantar surface with erythema and purulence.  Given this in conjunction with patient's persistent tachycardia and difficulty caring for herself.  Persistent tachycardia may be related to patient not having her evening medications as well she typically takes 50 mg of metoprolol twice  daily, home dose given here in the ED, versus dehydration or early sepsis.  Will start patient on Vanco and Zosyn for worsening infection given leukocytosis.  Will consult for admission with hospitalist.  Case discussed with Dr. Tomie China with orthopedics, Dr. Sharol Given to see in the hospital tomorrow, agrees with medicine admission and IV antibiotics.  Discussed with Dr. Maudie Mercury with [facility] hospitalist who will see and admit the patient.  Medications  sodium chloride 0.9 % bolus 1,000 mL (0 mLs Intravenous Stopped 04/11/18 2326)  sodium chloride 0.9 % bolus 500 mL (0 mLs Intravenous Stopped 04/12/18 0141)  metoprolol tartrate (LOPRESSOR) tablet 50 mg (50 mg Oral Given 04/12/18 0202)  vancomycin (VANCOCIN) IVPB 1000 mg/200 mL premix (1,000 mg Intravenous Transfusing/Transfer 04/12/18 0428)   piperacillin-tazobactam (ZOSYN) IVPB 3.375 g (3.375 g Intravenous Transfusing/Transfer 04/12/18 0428)    Final diagnoses:  Fall, initial encounter  Osteomyelitis of right foot, unspecified type [facility])  Dehydration        Jacqlyn Larsen, PA-C 04/12/18 2897    Ripley Fraise, MD 04/12/18 [phone]

## 2018-04-12 ENCOUNTER — Encounter (HOSPITAL_COMMUNITY): Payer: Self-pay | Admitting: Internal Medicine

## 2018-04-12 ENCOUNTER — Other Ambulatory Visit: Payer: Self-pay

## 2018-04-12 DIAGNOSIS — E11621 Type 2 diabetes mellitus with foot ulcer: Secondary | ICD-10-CM | POA: Diagnosis present

## 2018-04-12 DIAGNOSIS — L97519 Non-pressure chronic ulcer of other part of right foot with unspecified severity: Secondary | ICD-10-CM | POA: Diagnosis present

## 2018-04-12 DIAGNOSIS — G2581 Restless legs syndrome: Secondary | ICD-10-CM | POA: Diagnosis present

## 2018-04-12 DIAGNOSIS — L98499 Non-pressure chronic ulcer of skin of other sites with unspecified severity: Secondary | ICD-10-CM | POA: Diagnosis present

## 2018-04-12 DIAGNOSIS — M86271 Subacute osteomyelitis, right ankle and foot: Secondary | ICD-10-CM | POA: Diagnosis present

## 2018-04-12 DIAGNOSIS — W1839XA Other fall on same level, initial encounter: Secondary | ICD-10-CM | POA: Diagnosis present

## 2018-04-12 DIAGNOSIS — I10 Essential (primary) hypertension: Secondary | ICD-10-CM | POA: Diagnosis not present

## 2018-04-12 DIAGNOSIS — Z888 Allergy status to other drugs, medicaments and biological substances status: Secondary | ICD-10-CM | POA: Diagnosis not present

## 2018-04-12 DIAGNOSIS — E785 Hyperlipidemia, unspecified: Secondary | ICD-10-CM | POA: Diagnosis present

## 2018-04-12 DIAGNOSIS — E1140 Type 2 diabetes mellitus with diabetic neuropathy, unspecified: Secondary | ICD-10-CM | POA: Diagnosis present

## 2018-04-12 DIAGNOSIS — Y92129 Unspecified place in nursing home as the place of occurrence of the external cause: Secondary | ICD-10-CM | POA: Diagnosis not present

## 2018-04-12 DIAGNOSIS — E119 Type 2 diabetes mellitus without complications: Secondary | ICD-10-CM | POA: Diagnosis not present

## 2018-04-12 DIAGNOSIS — L03115 Cellulitis of right lower limb: Secondary | ICD-10-CM | POA: Diagnosis present

## 2018-04-12 DIAGNOSIS — M869 Osteomyelitis, unspecified: Secondary | ICD-10-CM | POA: Diagnosis not present

## 2018-04-12 DIAGNOSIS — K219 Gastro-esophageal reflux disease without esophagitis: Secondary | ICD-10-CM | POA: Diagnosis present

## 2018-04-12 DIAGNOSIS — E43 Unspecified severe protein-calorie malnutrition: Secondary | ICD-10-CM | POA: Diagnosis present

## 2018-04-12 DIAGNOSIS — Z87891 Personal history of nicotine dependence: Secondary | ICD-10-CM | POA: Diagnosis not present

## 2018-04-12 DIAGNOSIS — I129 Hypertensive chronic kidney disease with stage 1 through stage 4 chronic kidney disease, or unspecified chronic kidney disease: Secondary | ICD-10-CM | POA: Diagnosis present

## 2018-04-12 DIAGNOSIS — E1122 Type 2 diabetes mellitus with diabetic chronic kidney disease: Secondary | ICD-10-CM | POA: Diagnosis present

## 2018-04-12 DIAGNOSIS — L03119 Cellulitis of unspecified part of limb: Secondary | ICD-10-CM | POA: Diagnosis not present

## 2018-04-12 DIAGNOSIS — E1152 Type 2 diabetes mellitus with diabetic peripheral angiopathy with gangrene: Secondary | ICD-10-CM | POA: Diagnosis present

## 2018-04-12 DIAGNOSIS — Z6826 Body mass index (BMI) 26.0-26.9, adult: Secondary | ICD-10-CM | POA: Diagnosis not present

## 2018-04-12 DIAGNOSIS — D631 Anemia in chronic kidney disease: Secondary | ICD-10-CM | POA: Diagnosis present

## 2018-04-12 DIAGNOSIS — Z8673 Personal history of transient ischemic attack (TIA), and cerebral infarction without residual deficits: Secondary | ICD-10-CM | POA: Diagnosis not present

## 2018-04-12 DIAGNOSIS — Z79899 Other long term (current) drug therapy: Secondary | ICD-10-CM | POA: Diagnosis not present

## 2018-04-12 DIAGNOSIS — E860 Dehydration: Secondary | ICD-10-CM | POA: Diagnosis present

## 2018-04-12 DIAGNOSIS — Z66 Do not resuscitate: Secondary | ICD-10-CM | POA: Diagnosis present

## 2018-04-12 DIAGNOSIS — L98492 Non-pressure chronic ulcer of skin of other sites with fat layer exposed: Secondary | ICD-10-CM | POA: Diagnosis not present

## 2018-04-12 DIAGNOSIS — E1169 Type 2 diabetes mellitus with other specified complication: Secondary | ICD-10-CM | POA: Diagnosis present

## 2018-04-12 DIAGNOSIS — N183 Chronic kidney disease, stage 3 (moderate): Secondary | ICD-10-CM | POA: Diagnosis present

## 2018-04-12 DIAGNOSIS — L0390 Cellulitis, unspecified: Secondary | ICD-10-CM | POA: Diagnosis present

## 2018-04-12 DIAGNOSIS — M86471 Chronic osteomyelitis with draining sinus, right ankle and foot: Secondary | ICD-10-CM | POA: Diagnosis not present

## 2018-04-12 LAB — COMPREHENSIVE METABOLIC PANEL
ALT: 18 U/L (ref 0–44)
AST: 26 U/L (ref 15–41)
Albumin: 1.8 g/dL — ABNORMAL LOW (ref 3.5–5.0)
Alkaline Phosphatase: 77 U/L (ref 38–126)
Anion gap: 9 (ref 5–15)
BILIRUBIN TOTAL: 0.6 mg/dL (ref 0.3–1.2)
BUN: 11 mg/dL (ref 8–23)
CALCIUM: 8 mg/dL — AB (ref 8.9–10.3)
CHLORIDE: 106 mmol/L (ref 98–111)
CO2: 24 mmol/L (ref 22–32)
CREATININE: 1.03 mg/dL — AB (ref 0.44–1.00)
GFR, EST AFRICAN AMERICAN: 58 mL/min — AB (ref >60)
GFR, EST NON AFRICAN AMERICAN: 50 mL/min — AB (ref >60)
Glucose, Bld: 107 mg/dL — ABNORMAL HIGH (ref 70–99)
Potassium: 3.3 mmol/L — ABNORMAL LOW (ref 3.5–5.1)
Sodium: 139 mmol/L (ref 135–145)
TOTAL PROTEIN: 6.1 g/dL — AB (ref 6.5–8.1)

## 2018-04-12 LAB — CBC
HCT: 28.5 % — ABNORMAL LOW (ref 36.0–46.0)
Hemoglobin: 8.8 g/dL — ABNORMAL LOW (ref 12.0–15.0)
MCH: 26.3 pg (ref 26.0–34.0)
MCHC: 30.9 g/dL (ref 30.0–36.0)
MCV: 85.3 fL (ref 78.0–100.0)
PLATELETS: 363 10*3/uL (ref 150–400)
RBC: 3.34 MIL/uL — AB (ref 3.87–5.11)
RDW: 16.2 % — ABNORMAL HIGH (ref 11.5–15.5)
WBC: 12.4 10*3/uL — AB (ref 4.0–10.5)

## 2018-04-12 LAB — URINALYSIS, ROUTINE W REFLEX MICROSCOPIC
Bacteria, UA: NONE SEEN
Bilirubin Urine: NEGATIVE
GLUCOSE, UA: NEGATIVE mg/dL
HGB URINE DIPSTICK: NEGATIVE
Ketones, ur: NEGATIVE mg/dL
Leukocytes, UA: NEGATIVE
Nitrite: NEGATIVE
PH: 7 (ref 5.0–8.0)
Protein, ur: 30 mg/dL — AB
SPECIFIC GRAVITY, URINE: 1.01 (ref 1.005–1.030)

## 2018-04-12 LAB — GLUCOSE, CAPILLARY
GLUCOSE-CAPILLARY: 152 mg/dL — AB (ref 70–99)
GLUCOSE-CAPILLARY: 112 mg/dL — AB (ref 70–99)
GLUCOSE-CAPILLARY: 177 mg/dL — AB (ref 70–99)
Glucose-Capillary: 265 mg/dL — ABNORMAL HIGH (ref 70–99)
Glucose-Capillary: 107 mg/dL — ABNORMAL HIGH (ref 70–99)

## 2018-04-12 LAB — TROPONIN I
Troponin I: <0.03 ng/mL (ref <0.03)
Troponin I: 0.03 ng/mL (ref <0.03)

## 2018-04-12 LAB — SEDIMENTATION RATE: Sed Rate: 126 mm/hr — ABNORMAL HIGH (ref 0–22)

## 2018-04-12 LAB — PROTIME-INR
INR: 1.09
PROTHROMBIN TIME: 14 s (ref 11.4–15.2)

## 2018-04-12 LAB — SURGICAL PCR SCREEN
MRSA, PCR: NEGATIVE
Staphylococcus aureus: POSITIVE — AB

## 2018-04-12 LAB — D-DIMER, QUANTITATIVE: D-Dimer, Quant: 10.28 ug/mL-FEU — ABNORMAL HIGH (ref 0.00–0.50)

## 2018-04-12 MED ORDER — PANTOPRAZOLE SODIUM 40 MG PO TBEC
40.0000 mg | DELAYED_RELEASE_TABLET | Freq: Every day | ORAL | Status: DC
  Administered 2018-04-12 – 2018-04-19 (×7): 40 mg via ORAL
  Filled 2018-04-12 (×8): qty 1

## 2018-04-12 MED ORDER — PIPERACILLIN-TAZOBACTAM 3.375 G IVPB 30 MIN
3.3750 g | Freq: Once | INTRAVENOUS | Status: AC
  Administered 2018-04-12: 3.375 g via INTRAVENOUS
  Filled 2018-04-12: qty 50

## 2018-04-12 MED ORDER — TRAMADOL HCL 50 MG PO TABS: 50.0000 mg | ORAL_TABLET | Freq: Two times a day (BID) | ORAL | Status: DC | PRN

## 2018-04-12 MED ORDER — ACETAMINOPHEN 325 MG PO TABS: 650.0000 mg | ORAL_TABLET | Freq: Four times a day (QID) | ORAL | Status: DC | PRN

## 2018-04-12 MED ORDER — METOPROLOL TARTRATE 25 MG PO TABS
50.0000 mg | ORAL_TABLET | Freq: Once | ORAL | Status: AC
  Administered 2018-04-12: 50 mg via ORAL
  Filled 2018-04-12: qty 2

## 2018-04-12 MED ORDER — VITAMIN D 1000 UNITS PO TABS
1000.0000 [IU] | ORAL_TABLET | Freq: Every day | ORAL | Status: DC
  Administered 2018-04-12 – 2018-04-19 (×7): 1000 [IU] via ORAL
  Filled 2018-04-12 (×7): qty 1

## 2018-04-12 MED ORDER — PRAMIPEXOLE DIHYDROCHLORIDE 0.25 MG PO TABS
0.5000 mg | ORAL_TABLET | Freq: Every day | ORAL | Status: DC
  Administered 2018-04-12 – 2018-04-18 (×7): 0.5 mg via ORAL
  Filled 2018-04-12 (×9): qty 2

## 2018-04-12 MED ORDER — SODIUM CHLORIDE 0.9 % IV SOLN
INTRAVENOUS | Status: AC
  Administered 2018-04-12: 05:00:00 via INTRAVENOUS

## 2018-04-12 MED ORDER — PRO-STAT SUGAR FREE PO LIQD
30.0000 mL | Freq: Two times a day (BID) | ORAL | Status: DC
  Administered 2018-04-12 – 2018-04-19 (×14): 30 mL via ORAL
  Filled 2018-04-12 (×14): qty 30

## 2018-04-12 MED ORDER — VANCOMYCIN HCL IN DEXTROSE 1-5 GM/200ML-% IV SOLN
1000.0000 mg | Freq: Once | INTRAVENOUS | Status: AC
  Administered 2018-04-12: 1000 mg via INTRAVENOUS
  Filled 2018-04-12: qty 200

## 2018-04-12 MED ORDER — LEVETIRACETAM 500 MG PO TABS: 500.0000 mg | ORAL_TABLET | Freq: Two times a day (BID) | ORAL | Status: DC

## 2018-04-12 MED ORDER — SODIUM CHLORIDE 0.9 % IV BOLUS
500.0000 mL | Freq: Once | INTRAVENOUS | Status: AC
  Administered 2018-04-12: 500 mL via INTRAVENOUS

## 2018-04-12 MED ORDER — INSULIN ASPART 100 UNIT/ML ~~LOC~~ SOLN
0.0000 [IU] | SUBCUTANEOUS | Status: DC
  Administered 2018-04-12: 5 [IU] via SUBCUTANEOUS
  Administered 2018-04-12: 2 [IU] via SUBCUTANEOUS
  Administered 2018-04-12: 3 [IU] via SUBCUTANEOUS
  Administered 2018-04-13 (×2): 5 [IU] via SUBCUTANEOUS
  Administered 2018-04-13: 2 [IU] via SUBCUTANEOUS
  Administered 2018-04-13 (×2): 3 [IU] via SUBCUTANEOUS
  Administered 2018-04-13: 5 [IU] via SUBCUTANEOUS
  Administered 2018-04-14 (×2): 2 [IU] via SUBCUTANEOUS
  Administered 2018-04-14: 5 [IU] via SUBCUTANEOUS
  Administered 2018-04-14: 2 [IU] via SUBCUTANEOUS
  Administered 2018-04-14 (×2): 3 [IU] via SUBCUTANEOUS
  Administered 2018-04-15: 1 [IU] via SUBCUTANEOUS
  Administered 2018-04-15: 3 [IU] via SUBCUTANEOUS
  Administered 2018-04-15: 2 [IU] via SUBCUTANEOUS
  Administered 2018-04-15: 7 [IU] via SUBCUTANEOUS
  Administered 2018-04-15: 3 [IU] via SUBCUTANEOUS
  Administered 2018-04-15 – 2018-04-16 (×3): 2 [IU] via SUBCUTANEOUS
  Administered 2018-04-16: 1 [IU] via SUBCUTANEOUS
  Administered 2018-04-16: 5 [IU] via SUBCUTANEOUS
  Administered 2018-04-17: 3 [IU] via SUBCUTANEOUS
  Administered 2018-04-17 (×2): 2 [IU] via SUBCUTANEOUS
  Administered 2018-04-17: 5 [IU] via SUBCUTANEOUS
  Administered 2018-04-17: 3 [IU] via SUBCUTANEOUS
  Administered 2018-04-17: 2 [IU] via SUBCUTANEOUS
  Administered 2018-04-18: 5 [IU] via SUBCUTANEOUS
  Administered 2018-04-18: 3 [IU] via SUBCUTANEOUS
  Administered 2018-04-18 (×3): 2 [IU] via SUBCUTANEOUS
  Administered 2018-04-19: 5 [IU] via SUBCUTANEOUS
  Administered 2018-04-19: 2 [IU] via SUBCUTANEOUS
  Administered 2018-04-19: 5 [IU] via SUBCUTANEOUS
  Administered 2018-04-19: 7 [IU] via SUBCUTANEOUS
  Administered 2018-04-19: 1 [IU] via SUBCUTANEOUS
  Administered 2018-04-19: 5 [IU] via SUBCUTANEOUS

## 2018-04-12 MED ORDER — METOPROLOL TARTRATE 50 MG PO TABS
50.0000 mg | ORAL_TABLET | Freq: Two times a day (BID) | ORAL | Status: DC
  Administered 2018-04-12 – 2018-04-19 (×16): 50 mg via ORAL
  Filled 2018-04-12 (×16): qty 1

## 2018-04-12 MED ORDER — PIPERACILLIN-TAZOBACTAM 3.375 G IVPB
3.3750 g | Freq: Three times a day (TID) | INTRAVENOUS | Status: DC
  Administered 2018-04-12 – 2018-04-17 (×14): 3.375 g via INTRAVENOUS
  Filled 2018-04-12 (×13): qty 50

## 2018-04-12 MED ORDER — AMLODIPINE BESYLATE 5 MG PO TABS
5.0000 mg | ORAL_TABLET | Freq: Every day | ORAL | Status: DC
  Administered 2018-04-12 – 2018-04-19 (×8): 5 mg via ORAL
  Filled 2018-04-12 (×8): qty 1

## 2018-04-12 MED ORDER — ACETAMINOPHEN 650 MG RE SUPP: 650.0000 mg | Freq: Four times a day (QID) | RECTAL | Status: DC | PRN

## 2018-04-12 MED ORDER — VANCOMYCIN HCL 500 MG IV SOLR
500.0000 mg | INTRAVENOUS | Status: DC
  Administered 2018-04-13 – 2018-04-17 (×5): 500 mg via INTRAVENOUS
  Filled 2018-04-12 (×6): qty 500

## 2018-04-12 MED ORDER — FOLIC ACID 1 MG PO TABS
0.5000 mg | ORAL_TABLET | Freq: Every day | ORAL | Status: DC
  Administered 2018-04-12 – 2018-04-19 (×7): 0.5 mg via ORAL
  Filled 2018-04-12 (×7): qty 1

## 2018-04-12 MED ORDER — LOSARTAN POTASSIUM 50 MG PO TABS
50.0000 mg | ORAL_TABLET | Freq: Every day | ORAL | Status: DC
  Administered 2018-04-12 – 2018-04-19 (×8): 50 mg via ORAL
  Filled 2018-04-12 (×8): qty 1

## 2018-04-12 MED ORDER — PSYLLIUM 95 % PO PACK
1.0000 | PACK | Freq: Every day | ORAL | Status: DC | PRN
  Filled 2018-04-12: qty 1

## 2018-04-12 MED ORDER — ATORVASTATIN CALCIUM 40 MG PO TABS
40.0000 mg | ORAL_TABLET | Freq: Every day | ORAL | Status: DC
  Administered 2018-04-12 – 2018-04-19 (×8): 40 mg via ORAL
  Filled 2018-04-12 (×8): qty 1

## 2018-04-12 NOTE — H&P (Addendum)
TRH H&P   Patient Demographics:    Sara Griffith, is a 82 y.o. female  MRN: [id number]   DOB - Jul 07, 1936  Admit Date - 04/11/2018  Outpatient Primary MD for the patient is Kathyrn Lass, MD  Referring MD/NP/PA:  Benedetto Goad  Outpatient Specialists:   Patient coming from:  [facility]  Patient presents with  . Fall      HPI:    Sara Griffith  is a 82 y.o. female, w hypertension, dm2, ckd stage 3, severe protein calorie malnutrition, intracranial hemorrhage, pvd, chronic osteomyelitis of the right foot, apparently presents with fall where her legs gave out and she hit her lip and head.  Pt denies syncope, cp, palp, sob, n/v, abd pain, diarrhea, brbpr.   In ED,  Pt was noted to have increase in redness of the bilateral distal lower ext .   Glucose 205 Bun 15, Creatinine 1.11 Ast 28, Alt 20 Wbc 15.3, Hgb 9.3, Plt 444 CPK 215 Urinalysis prot 30  CT brain IMPRESSION: 1. No acute intracranial abnormalities. Chronic small vessel ischemic disease and brain atrophy. 2. No evidence of acute cervical spine fracture. 3. Multilevel degenerative disc disease and cervical spondylosis, as above. 4. Extensive cervical spondylosis is identified status post posterior decompression of C3 through C7. There is an anterolisthesis of C2 on C3 which is felt to likely reflect changes secondary to chronic spondylosis. Pt will be admitted for cellulitis and skin ulcer on the bottom of the right foot which is wheeping yellow discharge as well as anemia, and tachycardia.   CXR IMPRESSION: Chronic changes without acute abnormality.  Pelvis IMPRESSION: No acute abnormality noted.     Review of systems:    In addition to the HPI above,  No Fever-chills, No Headache, No changes with Vision or hearing, No problems swallowing food or Liquids, No Chest pain, Cough  or Shortness of Breath, No Abdominal pain, No Nausea or Vommitting, Bowel movements are regular, No Blood in stool or Urine, No dysuria,  No new joints pains-aches,  No new weakness, tingling, numbness in any extremity, No recent weight gain or loss, No polyuria, polydypsia or polyphagia, No significant Mental Stressors.  A full 10 point Review of Systems was done, except as stated above, all other Review of Systems were negative.   With Past History of the following :    Past Medical History:  Diagnosis Date  . Chronic osteomyelitis of toe, right ([place])   . CKD (chronic kidney disease)   . Diabetes mellitus without complication ([place])   . GERD (gastroesophageal reflux disease)   . HLD (hyperlipidemia)   . Hypertension   . IDA (iron deficiency anemia)   . PVD (peripheral vascular disease) ([place])   . RLS (restless legs syndrome)   . TIA (transient ischemic attack)   . Uses walker   . Vitamin D deficiency  Past Surgical History:  Procedure Laterality Date  . ABDOMINAL HYSTERECTOMY     with bladder tac  . AMPUTATION Right 08/07/2017   Procedure: RIGHT GREAT TOE AMPUTATION AT METATARSOPHALANGEAL JOINT;  Surgeon: Newt Minion, MD;  Location: [place];  Service: Orthopedics;  Laterality: Right;  . APPENDECTOMY    . CARPAL TUNNEL RELEASE Right   . CATARACT EXTRACTION, BILATERAL    . CERVICAL DISC SURGERY    . COLONOSCOPY    . DILATION AND CURETTAGE OF UTERUS     x 2  . left great toe amputation    . ROTATOR CUFF REPAIR Left       Social History:     Social History   Tobacco Use  . Smoking status: Former Smoker    Years: 30.00    Types: Cigarettes  . Smokeless tobacco: Former Systems developer    Types: Snuff    Quit date: 07/01/1954  Substance Use Topics  . Alcohol use: No     Lives - at home  Mobility - walks by self   Family History :     Family History  Problem Relation Age of Onset  . Hypertension Mother   . Cancer Father   . Arrhythmia Sister        ppm   . Arrhythmia Brother        ppm       Home Medications:   Prior to Admission medications   Medication Sig Start Date End Date Taking? Authorizing Provider  acetaminophen (TYLENOL) 325 MG tablet Take 650 mg by mouth every 6 (six) hours as needed for mild pain, moderate pain or headache.    [provider]  amLODipine (NORVASC) 5 MG tablet Take 1 tablet (5 mg total) by mouth daily. Please make overdue yearly appt with Dr. Burt Knack before anymore refills. 2nd attempt 11/09/17   Leanor Kail, PA  atorvastatin (LIPITOR) 40 MG tablet Take 40 mg by mouth daily.    [provider]  cholecalciferol (VITAMIN D) 1000 units tablet Take 1,000 Units by mouth daily.    [provider]  collagenase (SANTYL) ointment Apply 1 application topically daily.    [provider]  doxycycline (VIBRA-TABS) 100 MG tablet Take 1 tablet (100 mg total) by mouth 2 (two) times daily. 09/05/17   Newt Minion, MD  folic acid (FOLVITE) 1 MG tablet Take 0.5 mg by mouth daily.    [provider]  levETIRAcetam (KEPPRA) 500 MG tablet Take 500 mg by mouth 2 (two) times daily.    [provider]  linagliptin (TRADJENTA) 5 MG TABS tablet Take 5 mg by mouth daily.    [provider]  losartan (COZAAR) 50 MG tablet Take 1 tablet (50 mg total) by mouth daily. 08/10/17   Dessa Phi, DO  metoprolol tartrate (LOPRESSOR) 50 MG tablet Take 1 tablet (50 mg total) by mouth 2 (two) times daily with a meal. 07/04/16   Eugenie Filler, MD  omega-3 acid ethyl esters (LOVAZA) 1 g capsule Take 1 g by mouth 3 (three) times daily.  06/28/16   [provider]  omeprazole (PRILOSEC) 40 MG capsule Take 40 mg by mouth daily. 06/28/16   [provider]  OVER THE COUNTER MEDICATION Place 1 drop into both eyes as needed (dry eyes). Over the counter dry eye drops.    [provider]  pramipexole (MIRAPEX) 0.5 MG tablet Take 0.5 mg by mouth at bedtime.     [provider]  psyllium (  METAMUCIL) 58.6 % packet Take 1 packet by mouth daily as needed (constipation).    [provider]  traMADol (ULTRAM) 50 MG tablet Take 50 mg by mouth 3 (three) times daily. 06/05/16   [provider]  vitamin E 400 UNIT capsule Take 400 Units by mouth daily.    [provider]     Allergies:     Allergies  Allergen Reactions  . Cymbalta [Duloxetine Hcl] Swelling    SWELLING REACTION UNSPECIFIED   . Librax [Chlordiazepoxide-Clidinium] Swelling    SWELLING REACTION UNSPECIFIED   . Lyrica [Pregabalin] Swelling    SWELLING REACTION UNSPECIFIED   . Naproxen Swelling    SWELLING REACTION UNSPECIFIED      Physical Exam:   Vitals  Blood pressure (!) 128/56, pulse (!) 108, temperature 98.8 F (37.1 C), temperature source Oral, resp. rate (!) 27, height '4\' 11"'$  (1.499 m), weight 60.3 kg, SpO2 95 %.   1. General lying in bed in NAD,    2. Normal affect and insight, Not Suicidal or Homicidal, Awake Alert, Oriented X 3.  3. No F.N deficits, ALL C.Nerves Intact, Strength 5/5 all 4 extremities, Sensation intact all 4 extremities, Plantars down going.  4. Ears and Eyes appear Normal, Conjunctivae clear, PERRLA. Moist Oral Mucosa.  5. Supple Neck, No JVD, No cervical lymphadenopathy appriciated, No Carotid Bruits.  6. Symmetrical Chest wall movement, Good air movement bilaterally, CTAB.  7. RRR, No Gallops, Rubs or Murmurs, No Parasternal Heave.  8. Positive Bowel Sounds, Abdomen Soft, No tenderness, No organomegaly appriciated,No rebound -guarding or rigidity.  9.  No Cyanosis, large skin ulcer 3cm on the bottom of the right foot, with redness extending up to knee  10. Good muscle tone,  joints appear normal , no effusions, Normal ROM.  11. No Palpable Lymph Nodes in Neck or Axillae      Data Review:    CBC Recent Labs  Lab 04/11/18 2125  WBC 15.3*  HGB 9.3*  HCT 30.3*  PLT 444*  MCV 86.6  MCH 26.6  MCHC  30.7  RDW 16.2*  LYMPHSABS 1.6  MONOABS 0.7  EOSABS 0.3  BASOSABS 0.0   ------------------------------------------------------------------------------------------------------------------  Chemistries  Recent Labs  Lab 04/11/18 2125  NA 137  K 3.8  CL 99  CO2 25  GLUCOSE 205*  BUN 15  CREATININE 1.11*  CALCIUM 9.1  AST 28  ALT 20  ALKPHOS 102  BILITOT 0.6   ------------------------------------------------------------------------------------------------------------------ estimated creatinine clearance is 31.9 mL/min (A) (by C-G formula based on SCr of 1.11 mg/dL (H)). ------------------------------------------------------------------------------------------------------------------ No results for input(s): TSH, T4TOTAL, T3FREE, THYROIDAB in the last 72 hours.  Invalid input(s): FREET3  Coagulation profile No results for input(s): INR, PROTIME in the last 168 hours. ------------------------------------------------------------------------------------------------------------------- No results for input(s): DDIMER in the last 72 hours. -------------------------------------------------------------------------------------------------------------------  Cardiac Enzymes No results for input(s): CKMB, TROPONINI, MYOGLOBIN in the last 168 hours.  Invalid input(s): CK ------------------------------------------------------------------------------------------------------------------    Component Value Date/Time   BNP 295.2 (H) 10/21/2016 1437     ---------------------------------------------------------------------------------------------------------------  Urinalysis    Component Value Date/Time   COLORURINE STRAW (A) 04/11/2018 2326   APPEARANCEUR CLEAR 04/11/2018 2326   LABSPEC 1.010 04/11/2018 2326   PHURINE 7.0 04/11/2018 2326   GLUCOSEU NEGATIVE 04/11/2018 2326   HGBUR NEGATIVE 04/11/2018 2326   BILIRUBINUR NEGATIVE 04/11/2018 2326   KETONESUR NEGATIVE 04/11/2018  2326   PROTEINUR 30 (A) 04/11/2018 2326   NITRITE NEGATIVE 04/11/2018 2326   LEUKOCYTESUR NEGATIVE 04/11/2018 2326    ----------------------------------------------------------------------------------------------------------------  Imaging Results:    Dg Chest 2 View  Result Date: 04/11/2018 CLINICAL DATA:  Un witnessed fall today EXAM: CHEST - 2 VIEW COMPARISON:  08/09/2017 FINDINGS: Cardiac shadows within normal limits. No focal infiltrate or sizable effusion is seen. The lungs are well aerated with mild interstitial changes stable from the prior study. No acute bony abnormality is seen. Chronic compression deformity in the lower thoracic/upper lumbar spine is seen. IMPRESSION: Chronic changes without acute abnormality. Electronically Signed   By: Inez Catalina M.D.   On: 04/11/2018 20:54   Dg Pelvis 1-2 Views  Result Date: 04/11/2018 CLINICAL DATA:  Recent fall with pelvic pain, initial encounter EXAM: PELVIS - 1-2 VIEW COMPARISON:  08/01/2017 FINDINGS: Pelvic ring is intact. No acute fracture or dislocation is seen. Mild degenerative changes of the hip joints are noted bilaterally. IMPRESSION: No acute abnormality noted. Electronically Signed   By: Inez Catalina M.D.   On: 04/11/2018 20:55   Ct Head Wo Contrast  Result Date: 04/11/2018 CLINICAL DATA:  Head trauma. Fall this morning. EXAM: CT HEAD WITHOUT CONTRAST CT CERVICAL SPINE WITHOUT CONTRAST TECHNIQUE: Multidetector CT imaging of the head and cervical spine was performed following the standard protocol without intravenous contrast. Multiplanar CT image reconstructions of the cervical spine were also generated. COMPARISON:  08/02/2017 FINDINGS: CT HEAD FINDINGS Brain: No evidence of acute infarction, hemorrhage, hydrocephalus, extra-axial collection or mass lesion/mass effect. There is mild diffuse low-attenuation within the subcortical and periventricular white matter compatible with chronic microvascular disease. Prominence of the sulci  and ventricles compatible with brain atrophy. Vascular: No hyperdense vessel or unexpected calcification. Skull: Normal. Negative for fracture or focal lesion. Sinuses/Orbits: No acute finding. Other: None. CT CERVICAL SPINE FINDINGS Alignment: There is reversal of normal cervical lordosis. There is an anterolisthesis of C2 on C3 measuring 4 mm. Skull base and vertebrae: No acute fracture. No primary bone lesion or focal pathologic process. Soft tissues and spinal canal: No prevertebral fluid or swelling. No visible canal hematoma. Disc levels: Previous posterior laminectomy and decompression of C3 through C7. Solid fusion of C5 through C7 noted. Marked degenerative disc disease noted at C3-4, C4-5 and C7-T1. Upper chest: Calcified granuloma noted in the medial right upper lobe. Other: None IMPRESSION: 1. No acute intracranial abnormalities. Chronic small vessel ischemic disease and brain atrophy. 2. No evidence of acute cervical spine fracture. 3. Multilevel degenerative disc disease and cervical spondylosis, as above. 4. Extensive cervical spondylosis is identified status post posterior decompression of C3 through C7. There is an anterolisthesis of C2 on C3 which is felt to likely reflect changes secondary to chronic spondylosis. Electronically Signed   By: Kerby Moors M.D.   On: 04/11/2018 21:23   Ct Cervical Spine Wo Contrast  Result Date: 04/11/2018 CLINICAL DATA:  Head trauma. Fall this morning. EXAM: CT HEAD WITHOUT CONTRAST CT CERVICAL SPINE WITHOUT CONTRAST TECHNIQUE: Multidetector CT imaging of the head and cervical spine was performed following the standard protocol without intravenous contrast. Multiplanar CT image reconstructions of the cervical spine were also generated. COMPARISON:  08/02/2017 FINDINGS: CT HEAD FINDINGS Brain: No evidence of acute infarction, hemorrhage, hydrocephalus, extra-axial collection or mass lesion/mass effect. There is mild diffuse low-attenuation within the  subcortical and periventricular white matter compatible with chronic microvascular disease. Prominence of the sulci and ventricles compatible with brain atrophy. Vascular: No hyperdense vessel or unexpected calcification. Skull: Normal. Negative for fracture or focal lesion. Sinuses/Orbits: No acute finding. Other: None. CT CERVICAL SPINE FINDINGS Alignment: There is reversal of  normal cervical lordosis. There is an anterolisthesis of C2 on C3 measuring 4 mm. Skull base and vertebrae: No acute fracture. No primary bone lesion or focal pathologic process. Soft tissues and spinal canal: No prevertebral fluid or swelling. No visible canal hematoma. Disc levels: Previous posterior laminectomy and decompression of C3 through C7. Solid fusion of C5 through C7 noted. Marked degenerative disc disease noted at C3-4, C4-5 and C7-T1. Upper chest: Calcified granuloma noted in the medial right upper lobe. Other: None IMPRESSION: 1. No acute intracranial abnormalities. Chronic small vessel ischemic disease and brain atrophy. 2. No evidence of acute cervical spine fracture. 3. Multilevel degenerative disc disease and cervical spondylosis, as above. 4. Extensive cervical spondylosis is identified status post posterior decompression of C3 through C7. There is an anterolisthesis of C2 on C3 which is felt to likely reflect changes secondary to chronic spondylosis. Electronically Signed   By: Kerby Moors M.D.   On: 04/11/2018 21:23    ekg st at 100, nl axis   Assessment & Plan:    Principal Problem:   Cellulitis Active Problems:   HTN (hypertension)   Type 2 diabetes mellitus without complication, without long-term current use of insulin (HCC)    Cellulitis, Skin ulcer Blood culture x2 Wound culture Check esr vanco iv, zosyn iv pharmacy to dose NPO Dr. Sharol Given to see this am, appreciate input  Tachycardia Tele Trop I q6h x3 Check cardiac echo  D dimer, if positive then VQ scan in am The tachycardia may be due  to her missing her home dose of metoprolol or possibly early sepsis  Dm2 HOLD Tradjenta since NPO fsbs ac and qhs, ISS  Hypertension Cont Amlodipine '5mg'$  po qday Cont Losartan '50mg'$  po qday Cont metoprolol '50mg'$  po bid Check cmp in am  PVD, Hyperlipidemia Cont Lipitor '40mg'$  po qhs Unclear why not on aspirin/ plavix  Prior h/o intracranial hemorrhage, Seizure do ???  Cont Keppra '500mg'$  po bid  RLS  Cont Mirapex 0.'5mg'$  po qhs  Severe protein calorie malnutrition prostat 69m po bid  DVT Prophylaxis SCDs   AM Labs Ordered, also please review Full Orders  Family Communication: Admission, patients condition and plan of care including tests being ordered have been discussed with the patient  who indicate understanding and agree with the plan and Code Status.  Code Status DNR, yellow sheet w patient  Likely DC to  TBD  Condition GUARDED   Consults called:  orthpedics by ED, appreciate input  Admission status: inpatient  Time spent in minutes : 70   JJani GravelM.D on 04/12/2018 at 4:09 AM  Between 7am to 7pm - Pager - [phone] . After 7pm go to [email or web address] - password [email or web address] - Office  [phone]

## 2018-04-12 NOTE — Progress Notes (Signed)
CRITICAL VALUE ALERT  Critical Value: Troponin 0.03 Date & Time Notied:  04/12/2018 1121 Provider Notified: Dr. Jonnie Finner notified   Orders Received/Actions taken: no new orders received will continue to monitor

## 2018-04-12 NOTE — Progress Notes (Signed)
Pharmacy Antibiotic Note  Samya Allender is a 82 y.o. female admitted on 04/11/2018 with cellulitis.  Pharmacy has been consulted for Vancomycin / Zosyn dosing.  Plan: Zosyn 3.375 grams iv Q 8 hours Vancomycin 500 mg iv Q 24 hours Follow up plans   Height: 4\' 11"  (149.9 cm) Weight: 130 lb 11.7 oz (59.3 kg) IBW/kg (Calculated) : 43.2  Temp (24hrs), Avg:98.7 F (37.1 C), Min:98.7 F (37.1 C), Max:98.8 F (37.1 C)  Recent Labs  Lab 04/11/18 2125 04/12/18 0948  WBC 15.3* 12.4*  CREATININE 1.11* 1.03*    Estimated Creatinine Clearance: 34.1 mL/min (A) (by C-G formula based on SCr of 1.03 mg/dL (H)).    Allergies  Allergen Reactions  . Cymbalta [Duloxetine Hcl] Swelling    SWELLING REACTION UNSPECIFIED   . Librax [Chlordiazepoxide-Clidinium] Swelling    SWELLING REACTION UNSPECIFIED   . Lyrica [Pregabalin] Swelling    SWELLING REACTION UNSPECIFIED   . Naproxen Swelling    SWELLING REACTION UNSPECIFIED     Thank you for allowing pharmacy to be a part of this patient's care.  Tad Moore 04/12/2018 8:37 PM

## 2018-04-12 NOTE — Progress Notes (Signed)
Patient's d-dimer is 10.28 this AM. Paged Schorr, NP and made aware. Will continue to monitor.

## 2018-04-12 NOTE — Progress Notes (Addendum)
Pt admitted after MN (no charge).  Pt has known chronic osteo of R foot, admitted last night w/ increased infection (erythema/ drainage) in the R foot, tachycardia and ^WBC.  Started on IV abx.  Have called Dr Sharol Given and he said he will come and see the patient and likely put her on for surgery this week.  Labs and vitals reviewed.  No new medications.  Cont IV abx and IVF"s.   Kelly Splinter MD [facility] pgr [phone] 01/24/2018, 9:25 AM

## 2018-04-12 NOTE — Progress Notes (Signed)
NURSING PROGRESS NOTE  Sara Griffith [id number] Admission Data: 04/12/2018 6:57 AM Attending Provider: Roney Jaffe, MD RRN:[id number], Sara Haw, MD Code Status: DNR  Allergies:  Cymbalta [duloxetine hcl]; Librax [chlordiazepoxide-clidinium]; Lyrica [pregabalin]; and Naproxen Past Medical History:   has a past medical history of Chronic osteomyelitis of toe, right ([place]), CKD (chronic kidney disease), Diabetes mellitus without complication ([place]), GERD (gastroesophageal reflux disease), HLD (hyperlipidemia), Hypertension, IDA (iron deficiency anemia), PVD (peripheral vascular disease) ([place]), RLS (restless legs syndrome), TIA (transient ischemic attack), Uses walker, and Vitamin D deficiency. Past Surgical History:   has a past surgical history that includes Cervical disc surgery; Abdominal hysterectomy; Carpal tunnel release (Right); Rotator cuff repair (Left); Appendectomy; left great toe amputation; Colonoscopy; Cataract extraction, bilateral; Dilation and curettage of uterus; and Amputation (Right, 08/07/2017). Social History:   reports that she has quit smoking. Her smoking use included cigarettes. She quit after 30.00 years of use. She quit smokeless tobacco use about 63 years ago.  Her smokeless tobacco use included snuff. She reports that she does not drink alcohol or use drugs.  Sara Griffith is a 82 y.o. female patient admitted from ED:   Last Documented Vital Signs: Blood pressure 135/67, pulse 92, temperature 98.7 F (37.1 C), temperature source Oral, resp. rate 18, height 4\' 11"  (1.499 m), weight 59.3 kg, SpO2 99 %.  Cardiac Monitoring: Box # 12 in place. Cardiac monitor yields:normal sinus rhythm.  IV Fluids:  IV in place, occlusive dsg intact without redness, IV cath antecubital left, condition patent and no redness normal saline.   Skin: multiple diabetic ulcers, abrasions, rash noted to trunk, two sutures on left thigh.  Patient/Family orientated to room. Information packet given to  patient/family. Admission inpatient armband information verified with patient/family to include name and date of birth and placed on patient arm. Side rails up x 2, fall assessment and education completed with patient/family. Patient/family able to verbalize understanding of risk associated with falls and verbalized understanding to call for assistance before getting out of bed. Call light within reach. Patient/family able to voice and demonstrate understanding of unit orientation instructions.    Will continue to evaluate and treat per MD orders.   Amaryllis Dyke, RN

## 2018-04-12 NOTE — ED Notes (Signed)
Pt ambulated with walker, maintained 120-124bpm throughout movement, 128bpm max.  Movement appeared strenuous but after conferring with pt, she said this is normal for her and that she is extremely tired.  Pt returned to bed.

## 2018-04-12 NOTE — ED Notes (Signed)
POA called and updated with patient permission, daughter will pick up patient

## 2018-04-12 NOTE — ED Notes (Signed)
Pt had episode of being difficult to arouse. CBG 258, oriented x4 on waking and now, PA at bedside for episode

## 2018-04-13 DIAGNOSIS — L98492 Non-pressure chronic ulcer of skin of other sites with fat layer exposed: Secondary | ICD-10-CM

## 2018-04-13 DIAGNOSIS — E119 Type 2 diabetes mellitus without complications: Secondary | ICD-10-CM

## 2018-04-13 DIAGNOSIS — M869 Osteomyelitis, unspecified: Secondary | ICD-10-CM

## 2018-04-13 DIAGNOSIS — L03115 Cellulitis of right lower limb: Secondary | ICD-10-CM

## 2018-04-13 DIAGNOSIS — M86471 Chronic osteomyelitis with draining sinus, right ankle and foot: Secondary | ICD-10-CM

## 2018-04-13 LAB — GLUCOSE, CAPILLARY
GLUCOSE-CAPILLARY: 258 mg/dL — AB (ref 70–99)
GLUCOSE-CAPILLARY: 200 mg/dL — AB (ref 70–99)
GLUCOSE-CAPILLARY: 260 mg/dL — AB (ref 70–99)
GLUCOSE-CAPILLARY: 282 mg/dL — AB (ref 70–99)
Glucose-Capillary: 238 mg/dL — ABNORMAL HIGH (ref 70–99)
Glucose-Capillary: 240 mg/dL — ABNORMAL HIGH (ref 70–99)
Glucose-Capillary: 254 mg/dL — ABNORMAL HIGH (ref 70–99)

## 2018-04-13 LAB — CBC
HEMATOCRIT: 28.6 % — AB (ref 36.0–46.0)
Hemoglobin: 8.7 g/dL — ABNORMAL LOW (ref 12.0–15.0)
MCH: 26.2 pg (ref 26.0–34.0)
MCHC: 30.4 g/dL (ref 30.0–36.0)
MCV: 86.1 fL (ref 78.0–100.0)
Platelets: 386 10*3/uL (ref 150–400)
RBC: 3.32 MIL/uL — AB (ref 3.87–5.11)
RDW: 16.5 % — ABNORMAL HIGH (ref 11.5–15.5)
WBC: 11.5 10*3/uL — AB (ref 4.0–10.5)

## 2018-04-13 MED ORDER — SODIUM CHLORIDE 0.9 % IV SOLN
INTRAVENOUS | Status: DC
  Administered 2018-04-13 – 2018-04-15 (×3): via INTRAVENOUS

## 2018-04-13 MED ORDER — POTASSIUM CHLORIDE CRYS ER 20 MEQ PO TBCR
30.0000 meq | EXTENDED_RELEASE_TABLET | Freq: Three times a day (TID) | ORAL | Status: AC
  Administered 2018-04-13 (×2): 30 meq via ORAL
  Filled 2018-04-13 (×2): qty 1

## 2018-04-13 MED ORDER — GUAIFENESIN ER 600 MG PO TB12
600.0000 mg | ORAL_TABLET | Freq: Two times a day (BID) | ORAL | Status: DC | PRN
  Administered 2018-04-14: 600 mg via ORAL
  Filled 2018-04-13: qty 1

## 2018-04-13 NOTE — Progress Notes (Signed)
[facility] Progress Note  Subjective: no new c/o's.   Vitals:   04/13/18 0437 04/13/18 0438 04/13/18 0808 04/13/18 1321  BP: (!) 142/63  128/63 132/63  Pulse: 97 98 85 88  Resp: 20   20  Temp: 99.2 F (37.3 C)   98.7 F (37.1 C)  TempSrc:    Oral  SpO2: (!) 89% 90%  99%  Weight:      Height:        Inpatient medications: . amLODipine  5 mg Oral Daily  . atorvastatin  40 mg Oral Daily  . cholecalciferol  1,000 Units Oral Daily  . feeding supplement (PRO-STAT SUGAR FREE 64)  30 mL Oral BID  . folic acid  0.5 mg Oral Daily  . insulin aspart  0-9 Units Subcutaneous Q4H  . losartan  50 mg Oral Daily  . metoprolol tartrate  50 mg Oral BID WC  . pantoprazole  40 mg Oral Daily  . potassium chloride  30 mEq Oral TID  . pramipexole  0.5 mg Oral QHS   . piperacillin-tazobactam (ZOSYN)  IV 3.375 g (04/13/18 1305)  . vancomycin 500 mg (04/13/18 0517)   acetaminophen **OR** acetaminophen, psyllium, traMADol  Exam:  frail eldelry WF no distress, calm and resting  no jvd  Chest cta bilat no rales or wheezing  Cor reg no mrg  Abd soft ntnd  Ext bilat LE 1+ edema, bilat LE erythema below the knees , eschars  R foot large plantar ulcer, 2" x 2", R toe ampx 1  NF, ox 3, gen weakness   Presentation Summary: Sara Griffith  is a 82 y.o. female, w hypertension, dm2, ckd stage 3, severe protein calorie malnutrition, intracranial hemorrhage, pvd, chronic osteomyelitis of the right foot, presented with a fall where her legs gave out and she hit her lip and head.  Pt denies syncope, cp, palp, sob, n/v, abd pain, diarrhea, brbpr.  In ED pt was noted to have increase in redness of the bilateral distal lower ext .   Glucose 205 Bun 15, Creatinine 1.11    Ast 28, Alt 20 Wbc 15.3, Hgb 9.3, Plt 444 CPK 215 Urinalysis prot 30  CXR IMPRESSION: Chronic changes without acute abnormality.  CT brain IMPRESSION: 1. No acute intracranial abnormalities. Chronic small vessel ischemic disease  and brain atrophy. 2. No evidence of acute cervical spine fracture.  3. Multilevel degenerative disc disease and cervical spondylosis, as above. 4. Extensive cervical spondylosis is identified status post posterior decompression of C3 through C7. There is an anterolisthesis of C2 on C3 which is felt to likely reflect changes secondary to chronic spondylosis.   Pt will be admitted for cellulitis and skin ulcer on the bottom of the right foot which is wheeping yellow discharge as well as anemia, and tachycardia.       Impression/ Plan:   Principal Problem:   Cellulitis Active Problems:   HTN (hypertension)   DM2 w/o complication w/o long-term current use of insulin    Cellulitis/ chronic skin ulcer R foot - Blood culture x2 are negative - Wound culture is pending - vanco iv, zosyn iv per pharm - Low grade fever, wbc improving - Dr. Sharol Given saw today, plan is for R BKA on Friday, appreciate input  Tachycardia Resolved w/ Rx of infection Will dc telemetry   Dm2 Holding home Tradjenta fsbs ac and qhs, cont SSI  Hypertension - good control Cont Amlodipine 5mg  po qday Cont Losartan 50mg  po qday Cont metoprolol 50mg  po bid  PVD, Hyperlipidemia Cont Lipitor 40mg  po qhs Unclear why not on aspirin/ plavix  Prior h/o intracranial hemorrhage - not taking Keppra anymore, ICH was > 6 mos ago  RLS  Cont Mirapex 0.5mg  po qhs  Severe protein calorie malnutrition prostat 68mL po bid    DVT Prophylaxis: SCDs Family Communication: no family here Code Status: DNR, yellow sheet w patient Disposition: will need SNF after BKA per dr Sharol Given Condition: frail Consults called:  orthpedics Dr Sharol Given Admission status: inpatient       Kelly Splinter MD [facility] pgr [phone] 01/24/2018, 9:25 AM      Recent Labs  Lab 04/11/18 2125 04/12/18 0948  NA 137 139  K 3.8 3.3*  CL 99 106  CO2 25 24  GLUCOSE 205* 107*  BUN 15 11  CREATININE 1.11* 1.03*   CALCIUM 9.1 8.0*   Recent Labs  Lab 04/11/18 2125 04/12/18 0948  AST 28 26  ALT 20 18  ALKPHOS 102 77  BILITOT 0.6 0.6  PROT 7.7 6.1*  ALBUMIN 2.3* 1.8*   Recent Labs  Lab 04/11/18 2125 04/12/18 0948 04/13/18 0347  WBC 15.3* 12.4* 11.5*  NEUTROABS 12.6*  --   --   HGB 9.3* 8.8* 8.7*  HCT 30.3* 28.5* 28.6*  MCV 86.6 85.3 86.1  PLT 444* 363 386   Iron/TIBC/Ferritin/ %Sat No results found for: IRON, TIBC, FERRITIN, IRONPCTSAT

## 2018-04-13 NOTE — Consult Note (Signed)
ORTHOPAEDIC CONSULTATION  REQUESTING PHYSICIAN: Roney Jaffe, MD  Chief Complaint: Increasing cellulitis right lower extremity.  HPI: Sara Griffith is a 82 y.o. female who presents with osteomyelitis ulceration right forefoot with diabetes and peripheral vascular disease.  Past Medical History:  Diagnosis Date  . Chronic osteomyelitis of toe, right ([place])   . CKD (chronic kidney disease)   . Diabetes mellitus without complication ([place])   . GERD (gastroesophageal reflux disease)   . HLD (hyperlipidemia)   . Hypertension   . IDA (iron deficiency anemia)   . PVD (peripheral vascular disease) ([place])   . RLS (restless legs syndrome)   . TIA (transient ischemic attack)   . Uses walker   . Vitamin D deficiency    Past Surgical History:  Procedure Laterality Date  . ABDOMINAL HYSTERECTOMY     with bladder tac  . AMPUTATION Right 08/07/2017   Procedure: RIGHT GREAT TOE AMPUTATION AT METATARSOPHALANGEAL JOINT;  Surgeon: Newt Minion, MD;  Location: [place];  Service: Orthopedics;  Laterality: Right;  . APPENDECTOMY    . CARPAL TUNNEL RELEASE Right   . CATARACT EXTRACTION, BILATERAL    . CERVICAL DISC SURGERY    . COLONOSCOPY    . DILATION AND CURETTAGE OF UTERUS     x 2  . left great toe amputation    . ROTATOR CUFF REPAIR Left    Social History   Socioeconomic History  . Marital status: Widowed    Spouse name: Not on file  . Number of children: Not on file  . Years of education: Not on file  . Highest education level: Not on file  Occupational History  . Not on file  Social Needs  . Financial resource strain: Not on file  . Food insecurity:    Worry: Not on file    Inability: Not on file  . Transportation needs:    Medical: Not on file    Non-medical: Not on file  Tobacco Use  . Smoking status: Former Smoker    Years: 30.00    Types: Cigarettes  . Smokeless tobacco: Former Systems developer    Types: Snuff    Quit date: 07/01/1954  Substance and Sexual Activity  .  Alcohol use: No  . Drug use: No  . Sexual activity: Not on file  Lifestyle  . Physical activity:    Days per week: Not on file    Minutes per session: Not on file  . Stress: Not on file  Relationships  . Social connections:    Talks on phone: Not on file    Gets together: Not on file    Attends religious service: Not on file    Active member of club or organization: Not on file    Attends meetings of clubs or organizations: Not on file    Relationship status: Not on file  Other Topics Concern  . Not on file  Social History Narrative  . Not on file   Family History  Problem Relation Age of Onset  . Hypertension Mother   . Cancer Father   . Arrhythmia Sister        ppm  . Arrhythmia Brother        ppm   - negative except otherwise stated in the family history section Allergies  Allergen Reactions  . Cymbalta [Duloxetine Hcl] Swelling    SWELLING REACTION UNSPECIFIED   . Librax [Chlordiazepoxide-Clidinium] Swelling    SWELLING REACTION UNSPECIFIED   . Lyrica [Pregabalin] Swelling  SWELLING REACTION UNSPECIFIED   . Naproxen Swelling    SWELLING REACTION UNSPECIFIED    Prior to Admission medications   Medication Sig Start Date End Date Taking? Authorizing Provider  amLODipine (NORVASC) 5 MG tablet Take 1 tablet (5 mg total) by mouth daily. Please make overdue yearly appt with Dr. Burt Knack before anymore refills. 2nd attempt Patient taking differently: Take 5 mg by mouth daily.  11/09/17  Yes Bhagat, Bhavinkumar, PA  atorvastatin (LIPITOR) 40 MG tablet Take 40 mg by mouth at bedtime.    Yes [provider]  cholecalciferol (VITAMIN D) 1000 units tablet Take 1,000 Units by mouth daily.   Yes [provider]  linagliptin (TRADJENTA) 5 MG TABS tablet Take 5 mg by mouth daily.   Yes [provider]  losartan (COZAAR) 50 MG tablet Take 1 tablet (50 mg total) by mouth daily. 08/10/17  Yes Dessa Phi, DO  metoprolol tartrate (LOPRESSOR) 50 MG tablet  Take 1 tablet (50 mg total) by mouth 2 (two) times daily with a meal. 07/04/16  Yes Eugenie Filler, MD  omega-3 acid ethyl esters (LOVAZA) 1 g capsule Take 1 g by mouth 3 (three) times daily.  06/28/16  Yes [provider]  omeprazole (PRILOSEC) 40 MG capsule Take 40 mg by mouth daily. 06/28/16  Yes [provider]  pramipexole (MIRAPEX) 0.5 MG tablet Take 0.5 mg by mouth at bedtime.   Yes [provider]  traMADol (ULTRAM) 50 MG tablet Take 50 mg by mouth 2 (two) times daily.  06/05/16  Yes [provider]  vitamin E 400 UNIT capsule Take 400 Units by mouth daily.   Yes [provider]   Dg Chest 2 View  Result Date: 04/11/2018 CLINICAL DATA:  Un witnessed fall today EXAM: CHEST - 2 VIEW COMPARISON:  08/09/2017 FINDINGS: Cardiac shadows within normal limits. No focal infiltrate or sizable effusion is seen. The lungs are well aerated with mild interstitial changes stable from the prior study. No acute bony abnormality is seen. Chronic compression deformity in the lower thoracic/upper lumbar spine is seen. IMPRESSION: Chronic changes without acute abnormality. Electronically Signed   By: Inez Catalina M.D.   On: 04/11/2018 20:54   Dg Pelvis 1-2 Views  Result Date: 04/11/2018 CLINICAL DATA:  Recent fall with pelvic pain, initial encounter EXAM: PELVIS - 1-2 VIEW COMPARISON:  08/01/2017 FINDINGS: Pelvic ring is intact. No acute fracture or dislocation is seen. Mild degenerative changes of the hip joints are noted bilaterally. IMPRESSION: No acute abnormality noted. Electronically Signed   By: Inez Catalina M.D.   On: 04/11/2018 20:55   Ct Head Wo Contrast  Result Date: 04/11/2018 CLINICAL DATA:  Head trauma. Fall this morning. EXAM: CT HEAD WITHOUT CONTRAST CT CERVICAL SPINE WITHOUT CONTRAST TECHNIQUE: Multidetector CT imaging of the head and cervical spine was performed following the standard protocol without intravenous contrast. Multiplanar CT image  reconstructions of the cervical spine were also generated. COMPARISON:  08/02/2017 FINDINGS: CT HEAD FINDINGS Brain: No evidence of acute infarction, hemorrhage, hydrocephalus, extra-axial collection or mass lesion/mass effect. There is mild diffuse low-attenuation within the subcortical and periventricular white matter compatible with chronic microvascular disease. Prominence of the sulci and ventricles compatible with brain atrophy. Vascular: No hyperdense vessel or unexpected calcification. Skull: Normal. Negative for fracture or focal lesion. Sinuses/Orbits: No acute finding. Other: None. CT CERVICAL SPINE FINDINGS Alignment: There is reversal of normal cervical lordosis. There is an anterolisthesis of C2 on C3 measuring 4 mm. Skull base and  vertebrae: No acute fracture. No primary bone lesion or focal pathologic process. Soft tissues and spinal canal: No prevertebral fluid or swelling. No visible canal hematoma. Disc levels: Previous posterior laminectomy and decompression of C3 through C7. Solid fusion of C5 through C7 noted. Marked degenerative disc disease noted at C3-4, C4-5 and C7-T1. Upper chest: Calcified granuloma noted in the medial right upper lobe. Other: None IMPRESSION: 1. No acute intracranial abnormalities. Chronic small vessel ischemic disease and brain atrophy. 2. No evidence of acute cervical spine fracture. 3. Multilevel degenerative disc disease and cervical spondylosis, as above. 4. Extensive cervical spondylosis is identified status post posterior decompression of C3 through C7. There is an anterolisthesis of C2 on C3 which is felt to likely reflect changes secondary to chronic spondylosis. Electronically Signed   By: Kerby Moors M.D.   On: 04/11/2018 21:23   Ct Cervical Spine Wo Contrast  Result Date: 04/11/2018 CLINICAL DATA:  Head trauma. Fall this morning. EXAM: CT HEAD WITHOUT CONTRAST CT CERVICAL SPINE WITHOUT CONTRAST TECHNIQUE: Multidetector CT imaging of the head and  cervical spine was performed following the standard protocol without intravenous contrast. Multiplanar CT image reconstructions of the cervical spine were also generated. COMPARISON:  08/02/2017 FINDINGS: CT HEAD FINDINGS Brain: No evidence of acute infarction, hemorrhage, hydrocephalus, extra-axial collection or mass lesion/mass effect. There is mild diffuse low-attenuation within the subcortical and periventricular white matter compatible with chronic microvascular disease. Prominence of the sulci and ventricles compatible with brain atrophy. Vascular: No hyperdense vessel or unexpected calcification. Skull: Normal. Negative for fracture or focal lesion. Sinuses/Orbits: No acute finding. Other: None. CT CERVICAL SPINE FINDINGS Alignment: There is reversal of normal cervical lordosis. There is an anterolisthesis of C2 on C3 measuring 4 mm. Skull base and vertebrae: No acute fracture. No primary bone lesion or focal pathologic process. Soft tissues and spinal canal: No prevertebral fluid or swelling. No visible canal hematoma. Disc levels: Previous posterior laminectomy and decompression of C3 through C7. Solid fusion of C5 through C7 noted. Marked degenerative disc disease noted at C3-4, C4-5 and C7-T1. Upper chest: Calcified granuloma noted in the medial right upper lobe. Other: None IMPRESSION: 1. No acute intracranial abnormalities. Chronic small vessel ischemic disease and brain atrophy. 2. No evidence of acute cervical spine fracture. 3. Multilevel degenerative disc disease and cervical spondylosis, as above. 4. Extensive cervical spondylosis is identified status post posterior decompression of C3 through C7. There is an anterolisthesis of C2 on C3 which is felt to likely reflect changes secondary to chronic spondylosis. Electronically Signed   By: Kerby Moors M.D.   On: 04/11/2018 21:23   - pertinent xrays, CT, MRI studies were reviewed and independently interpreted  Positive ROS: All other systems  have been reviewed and were otherwise negative with the exception of those mentioned in the HPI and as above.  Physical Exam: General: Alert, no acute distress Psychiatric: Patient is competent for consent with normal mood and affect Lymphatic: No axillary or cervical lymphadenopathy Cardiovascular: No pedal edema Respiratory: No cyanosis, no use of accessory musculature GI: No organomegaly, abdomen is soft and non-tender    Images:  @ENCIMAGES @  Labs:  Lab Results  Component Value Date   HGBA1C 7.4 (H) 08/02/2017   HGBA1C 8.0 (H) 10/21/2016   HGBA1C 7.9 (H) 07/03/2016   ESRSEDRATE 126 (H) 04/12/2018    Lab Results  Component Value Date   ALBUMIN 1.8 (L) 04/12/2018   ALBUMIN 2.3 (L) 04/11/2018   ALBUMIN 2.4 (L) 08/03/2017  Neurologic: Patient does not have protective sensation bilateral lower extremities.   MUSCULOSKELETAL:   Skin: Examination of both legs she has dermatitis in both legs with more swelling in the right leg than the left leg.  There are multiple abrasions on the left leg there is no weeping edema at this time.  Patient has a small Waggoner grade 1 ulcer beneath the metatarsal head of the left foot and open draining ulcers beneath all of the metatarsal heads on the right foot.  Examination patient has palpable pulses bilaterally.  She has exposed bone beneath the metatarsal heads of the right foot with osteomyelitis there is a healing Waggoner grade 1 ulcer on the left foot.  Assessment: Assessment: Diabetic insensate neuropathy with venous insufficiency with increased swelling of the right lower extremity with osteomyelitis of the right forefoot with a healing ulcer of the left foot.  Plan: Plan: We will plan for right transtibial amputation.  We will set up patient for surgery for Friday.  Risks and benefits were discussed including risk of the wound not healing.  Patient will need discharge to skilled nursing.  Thank you for the consult and the  opportunity to see Ms. Sara Griffith, [place] [phone] 7:02 AM

## 2018-04-13 NOTE — H&P (View-Only) (Signed)
ORTHOPAEDIC CONSULTATION  REQUESTING PHYSICIAN: Roney Jaffe, MD  Chief Complaint: Increasing cellulitis right lower extremity.  HPI: Sara Griffith is a 82 y.o. female who presents with osteomyelitis ulceration right forefoot with diabetes and peripheral vascular disease.  Past Medical History:  Diagnosis Date  . Chronic osteomyelitis of toe, right ([place])   . CKD (chronic kidney disease)   . Diabetes mellitus without complication ([place])   . GERD (gastroesophageal reflux disease)   . HLD (hyperlipidemia)   . Hypertension   . IDA (iron deficiency anemia)   . PVD (peripheral vascular disease) ([place])   . RLS (restless legs syndrome)   . TIA (transient ischemic attack)   . Uses walker   . Vitamin D deficiency    Past Surgical History:  Procedure Laterality Date  . ABDOMINAL HYSTERECTOMY     with bladder tac  . AMPUTATION Right 08/07/2017   Procedure: RIGHT GREAT TOE AMPUTATION AT METATARSOPHALANGEAL JOINT;  Surgeon: Newt Minion, MD;  Location: [facility];  Service: Orthopedics;  Laterality: Right;  . APPENDECTOMY    . CARPAL TUNNEL RELEASE Right   . CATARACT EXTRACTION, BILATERAL    . CERVICAL DISC SURGERY    . COLONOSCOPY    . DILATION AND CURETTAGE OF UTERUS     x 2  . left great toe amputation    . ROTATOR CUFF REPAIR Left    Social History   Socioeconomic History  . Marital status: Widowed    Spouse name: Not on file  . Number of children: Not on file  . Years of education: Not on file  . Highest education level: Not on file  Occupational History  . Not on file  Social Needs  . Financial resource strain: Not on file  . Food insecurity:    Worry: Not on file    Inability: Not on file  . Transportation needs:    Medical: Not on file    Non-medical: Not on file  Tobacco Use  . Smoking status: Former Smoker    Years: 30.00    Types: Cigarettes  . Smokeless tobacco: Former Systems developer    Types: Snuff    Quit date: 07/01/1954  Substance and Sexual Activity  .  Alcohol use: No  . Drug use: No  . Sexual activity: Not on file  Lifestyle  . Physical activity:    Days per week: Not on file    Minutes per session: Not on file  . Stress: Not on file  Relationships  . Social connections:    Talks on phone: Not on file    Gets together: Not on file    Attends religious service: Not on file    Active member of club or organization: Not on file    Attends meetings of clubs or organizations: Not on file    Relationship status: Not on file  Other Topics Concern  . Not on file  Social History Narrative  . Not on file   Family History  Problem Relation Age of Onset  . Hypertension Mother   . Cancer Father   . Arrhythmia Sister        ppm  . Arrhythmia Brother        ppm   - negative except otherwise stated in the family history section Allergies  Allergen Reactions  . Cymbalta [Duloxetine Hcl] Swelling    SWELLING REACTION UNSPECIFIED   . Librax [Chlordiazepoxide-Clidinium] Swelling    SWELLING REACTION UNSPECIFIED   . Lyrica [Pregabalin] Swelling  SWELLING REACTION UNSPECIFIED   . Naproxen Swelling    SWELLING REACTION UNSPECIFIED    Prior to Admission medications   Medication Sig Start Date End Date Taking? Authorizing Provider  amLODipine (NORVASC) 5 MG tablet Take 1 tablet (5 mg total) by mouth daily. Please make overdue yearly appt with Dr. Burt Knack before anymore refills. 2nd attempt Patient taking differently: Take 5 mg by mouth daily.  11/09/17  Yes Bhagat, Bhavinkumar, PA  atorvastatin (LIPITOR) 40 MG tablet Take 40 mg by mouth at bedtime.    Yes [provider]  cholecalciferol (VITAMIN D) 1000 units tablet Take 1,000 Units by mouth daily.   Yes [provider]  linagliptin (TRADJENTA) 5 MG TABS tablet Take 5 mg by mouth daily.   Yes [provider]  losartan (COZAAR) 50 MG tablet Take 1 tablet (50 mg total) by mouth daily. 08/10/17  Yes Dessa Phi, DO  metoprolol tartrate (LOPRESSOR) 50 MG tablet  Take 1 tablet (50 mg total) by mouth 2 (two) times daily with a meal. 07/04/16  Yes Eugenie Filler, MD  omega-3 acid ethyl esters (LOVAZA) 1 g capsule Take 1 g by mouth 3 (three) times daily.  06/28/16  Yes [provider]  omeprazole (PRILOSEC) 40 MG capsule Take 40 mg by mouth daily. 06/28/16  Yes [provider]  pramipexole (MIRAPEX) 0.5 MG tablet Take 0.5 mg by mouth at bedtime.   Yes [provider]  traMADol (ULTRAM) 50 MG tablet Take 50 mg by mouth 2 (two) times daily.  06/05/16  Yes [provider]  vitamin E 400 UNIT capsule Take 400 Units by mouth daily.   Yes [provider]   Dg Chest 2 View  Result Date: 04/11/2018 CLINICAL DATA:  Un witnessed fall today EXAM: CHEST - 2 VIEW COMPARISON:  08/09/2017 FINDINGS: Cardiac shadows within normal limits. No focal infiltrate or sizable effusion is seen. The lungs are well aerated with mild interstitial changes stable from the prior study. No acute bony abnormality is seen. Chronic compression deformity in the lower thoracic/upper lumbar spine is seen. IMPRESSION: Chronic changes without acute abnormality. Electronically Signed   By: Inez Catalina M.D.   On: 04/11/2018 20:54   Dg Pelvis 1-2 Views  Result Date: 04/11/2018 CLINICAL DATA:  Recent fall with pelvic pain, initial encounter EXAM: PELVIS - 1-2 VIEW COMPARISON:  08/01/2017 FINDINGS: Pelvic ring is intact. No acute fracture or dislocation is seen. Mild degenerative changes of the hip joints are noted bilaterally. IMPRESSION: No acute abnormality noted. Electronically Signed   By: Inez Catalina M.D.   On: 04/11/2018 20:55   Ct Head Wo Contrast  Result Date: 04/11/2018 CLINICAL DATA:  Head trauma. Fall this morning. EXAM: CT HEAD WITHOUT CONTRAST CT CERVICAL SPINE WITHOUT CONTRAST TECHNIQUE: Multidetector CT imaging of the head and cervical spine was performed following the standard protocol without intravenous contrast. Multiplanar CT image  reconstructions of the cervical spine were also generated. COMPARISON:  08/02/2017 FINDINGS: CT HEAD FINDINGS Brain: No evidence of acute infarction, hemorrhage, hydrocephalus, extra-axial collection or mass lesion/mass effect. There is mild diffuse low-attenuation within the subcortical and periventricular white matter compatible with chronic microvascular disease. Prominence of the sulci and ventricles compatible with brain atrophy. Vascular: No hyperdense vessel or unexpected calcification. Skull: Normal. Negative for fracture or focal lesion. Sinuses/Orbits: No acute finding. Other: None. CT CERVICAL SPINE FINDINGS Alignment: There is reversal of normal cervical lordosis. There is an anterolisthesis of C2 on C3 measuring 4 mm. Skull base and  vertebrae: No acute fracture. No primary bone lesion or focal pathologic process. Soft tissues and spinal canal: No prevertebral fluid or swelling. No visible canal hematoma. Disc levels: Previous posterior laminectomy and decompression of C3 through C7. Solid fusion of C5 through C7 noted. Marked degenerative disc disease noted at C3-4, C4-5 and C7-T1. Upper chest: Calcified granuloma noted in the medial right upper lobe. Other: None IMPRESSION: 1. No acute intracranial abnormalities. Chronic small vessel ischemic disease and brain atrophy. 2. No evidence of acute cervical spine fracture. 3. Multilevel degenerative disc disease and cervical spondylosis, as above. 4. Extensive cervical spondylosis is identified status post posterior decompression of C3 through C7. There is an anterolisthesis of C2 on C3 which is felt to likely reflect changes secondary to chronic spondylosis. Electronically Signed   By: Kerby Moors M.D.   On: 04/11/2018 21:23   Ct Cervical Spine Wo Contrast  Result Date: 04/11/2018 CLINICAL DATA:  Head trauma. Fall this morning. EXAM: CT HEAD WITHOUT CONTRAST CT CERVICAL SPINE WITHOUT CONTRAST TECHNIQUE: Multidetector CT imaging of the head and  cervical spine was performed following the standard protocol without intravenous contrast. Multiplanar CT image reconstructions of the cervical spine were also generated. COMPARISON:  08/02/2017 FINDINGS: CT HEAD FINDINGS Brain: No evidence of acute infarction, hemorrhage, hydrocephalus, extra-axial collection or mass lesion/mass effect. There is mild diffuse low-attenuation within the subcortical and periventricular white matter compatible with chronic microvascular disease. Prominence of the sulci and ventricles compatible with brain atrophy. Vascular: No hyperdense vessel or unexpected calcification. Skull: Normal. Negative for fracture or focal lesion. Sinuses/Orbits: No acute finding. Other: None. CT CERVICAL SPINE FINDINGS Alignment: There is reversal of normal cervical lordosis. There is an anterolisthesis of C2 on C3 measuring 4 mm. Skull base and vertebrae: No acute fracture. No primary bone lesion or focal pathologic process. Soft tissues and spinal canal: No prevertebral fluid or swelling. No visible canal hematoma. Disc levels: Previous posterior laminectomy and decompression of C3 through C7. Solid fusion of C5 through C7 noted. Marked degenerative disc disease noted at C3-4, C4-5 and C7-T1. Upper chest: Calcified granuloma noted in the medial right upper lobe. Other: None IMPRESSION: 1. No acute intracranial abnormalities. Chronic small vessel ischemic disease and brain atrophy. 2. No evidence of acute cervical spine fracture. 3. Multilevel degenerative disc disease and cervical spondylosis, as above. 4. Extensive cervical spondylosis is identified status post posterior decompression of C3 through C7. There is an anterolisthesis of C2 on C3 which is felt to likely reflect changes secondary to chronic spondylosis. Electronically Signed   By: Kerby Moors M.D.   On: 04/11/2018 21:23   - pertinent xrays, CT, MRI studies were reviewed and independently interpreted  Positive ROS: All other systems  have been reviewed and were otherwise negative with the exception of those mentioned in the HPI and as above.  Physical Exam: General: Alert, no acute distress Psychiatric: Patient is competent for consent with normal mood and affect Lymphatic: No axillary or cervical lymphadenopathy Cardiovascular: No pedal edema Respiratory: No cyanosis, no use of accessory musculature GI: No organomegaly, abdomen is soft and non-tender    Images:  @ENCIMAGES @  Labs:  Lab Results  Component Value Date   HGBA1C 7.4 (H) 08/02/2017   HGBA1C 8.0 (H) 10/21/2016   HGBA1C 7.9 (H) 07/03/2016   ESRSEDRATE 126 (H) 04/12/2018    Lab Results  Component Value Date   ALBUMIN 1.8 (L) 04/12/2018   ALBUMIN 2.3 (L) 04/11/2018   ALBUMIN 2.4 (L) 08/03/2017  Neurologic: Patient does not have protective sensation bilateral lower extremities.   MUSCULOSKELETAL:   Skin: Examination of both legs she has dermatitis in both legs with more swelling in the right leg than the left leg.  There are multiple abrasions on the left leg there is no weeping edema at this time.  Patient has a small Waggoner grade 1 ulcer beneath the metatarsal head of the left foot and open draining ulcers beneath all of the metatarsal heads on the right foot.  Examination patient has palpable pulses bilaterally.  She has exposed bone beneath the metatarsal heads of the right foot with osteomyelitis there is a healing Waggoner grade 1 ulcer on the left foot.  Assessment: Assessment: Diabetic insensate neuropathy with venous insufficiency with increased swelling of the right lower extremity with osteomyelitis of the right forefoot with a healing ulcer of the left foot.  Plan: Plan: We will plan for right transtibial amputation.  We will set up patient for surgery for Friday.  Risks and benefits were discussed including risk of the wound not healing.  Patient will need discharge to skilled nursing.  Thank you for the consult and the  opportunity to see Ms. Kody Vigil, [place] [phone] 7:02 AM

## 2018-04-14 DIAGNOSIS — I10 Essential (primary) hypertension: Secondary | ICD-10-CM

## 2018-04-14 DIAGNOSIS — L03119 Cellulitis of unspecified part of limb: Secondary | ICD-10-CM

## 2018-04-14 LAB — CBC
HCT: 31.4 % — ABNORMAL LOW (ref 36.0–46.0)
HEMOGLOBIN: 9.6 g/dL — AB (ref 12.0–15.0)
MCH: 26.2 pg (ref 26.0–34.0)
MCHC: 30.6 g/dL (ref 30.0–36.0)
MCV: 85.8 fL (ref 78.0–100.0)
PLATELETS: 413 10*3/uL — AB (ref 150–400)
RBC: 3.66 MIL/uL — ABNORMAL LOW (ref 3.87–5.11)
RDW: 16.6 % — ABNORMAL HIGH (ref 11.5–15.5)
WBC: 12.4 10*3/uL — ABNORMAL HIGH (ref 4.0–10.5)

## 2018-04-14 LAB — BASIC METABOLIC PANEL
ANION GAP: 7 (ref 5–15)
BUN: 11 mg/dL (ref 8–23)
CALCIUM: 8.1 mg/dL — AB (ref 8.9–10.3)
CO2: 26 mmol/L (ref 22–32)
CREATININE: 1.08 mg/dL — AB (ref 0.44–1.00)
Chloride: 106 mmol/L (ref 98–111)
GFR, EST AFRICAN AMERICAN: 55 mL/min — AB (ref >60)
GFR, EST NON AFRICAN AMERICAN: 47 mL/min — AB (ref >60)
GLUCOSE: 190 mg/dL — AB (ref 70–99)
Potassium: 4.5 mmol/L (ref 3.5–5.1)
Sodium: 139 mmol/L (ref 135–145)

## 2018-04-14 LAB — GLUCOSE, CAPILLARY
GLUCOSE-CAPILLARY: 251 mg/dL — AB (ref 70–99)
Glucose-Capillary: 182 mg/dL — ABNORMAL HIGH (ref 70–99)
Glucose-Capillary: 187 mg/dL — ABNORMAL HIGH (ref 70–99)
Glucose-Capillary: 187 mg/dL — ABNORMAL HIGH (ref 70–99)
Glucose-Capillary: 236 mg/dL — ABNORMAL HIGH (ref 70–99)
Glucose-Capillary: 239 mg/dL — ABNORMAL HIGH (ref 70–99)

## 2018-04-14 MED ORDER — ENOXAPARIN SODIUM 30 MG/0.3ML ~~LOC~~ SOLN
30.0000 mg | Freq: Every day | SUBCUTANEOUS | Status: DC
  Administered 2018-04-14 – 2018-04-19 (×4): 30 mg via SUBCUTANEOUS
  Filled 2018-04-14 (×5): qty 0.3

## 2018-04-14 NOTE — Progress Notes (Signed)
PROGRESS NOTE                                                                                                                                                                                                             Patient Demographics:    Sara Griffith, is a 82 y.o. female, DOB - 1936/06/27, TSV:[id number]  Admit date - 04/11/2018   Admitting Physician Jani Gravel, MD  Outpatient Primary MD for the patient is Kathyrn Lass, MD  LOS - 2   Chief Complaint  Patient presents with  . Fall       Brief Narrative   [name] a81 y.o.female,w hypertension, dm2, ckd stage 3, severe protein calorie malnutrition,intracranial hemorrhage,pvd, chronic osteomyelitis of the right foot, presented with a fall where her legs gave out and she hit her lip and head. Pt denies syncope, cp, palp, sob, n/v, abd pain, diarrhea, brbpr.  In ED pt was noted to have increase in redness of the bilateral distal lower ext .    Subjective:    Sara Griffith today has, No headache, No chest pain, No abdominal pain ,    Assessment  & Plan :    Principal Problem:   Cellulitis Active Problems:   HTN (hypertension)   Type 2 diabetes mellitus without complication, without long-term current use of insulin (HCC)   Skin ulcer (HCC)   Osteomyelitis of right foot (HCC)   Subacute Osteomyelitis of right ankle and foot/cellulitis/nonhealing wound -Blood cell count 15.3 on admission, improving on vancomycin and Zosyn, continue with broad-spectrum antibiotic pending surgery, blood culture remains negative. -Treatment per orthopedic team, reviewing outpatient cardiology note, she had osteomyelitis ulceration cellulitis, plan was for his tibial amputation in 2 weeks, it will be done this Friday given she is admitted.   Sinus tachycardia -due to infection, resolved with antibiotic and hydration   Diabetes mellitus type 2 -Continue to hold Tradjenta, continue with insulin  sliding scale, CBG are controlled today, will hold on initiation of long-acting insulin.  Hypertension  -Acceptable, continue with home regimen of amlodipine, losartan and metoprolol   Hyperlipidemia - Cont Lipitor 40mg  po qhs  Prior h/o intracranial hemorrhage - not taking Keppra anymore, ICH was > 6 mos ago  RLS  - Cont Mirapex 0.5mg  po qhs  Severe protein calorie malnutrition - prostat 65mL po bid  Code Status : DNR  Family Communication  : None at bedside  Disposition Plan  : will need SNF placement  Consults  :  Ortho, dr Sharol Given  Procedures  : None  DVT Prophylaxis  :  [place] lovenox  Lab Results  Component Value Date   PLT 413 (H) 04/14/2018    Antibiotics  :    Anti-infectives (From admission, onward)   Start     Dose/Rate Route Frequency Ordered Stop   04/13/18 0600  vancomycin (VANCOCIN) 500 mg in sodium chloride 0.9 % 100 mL IVPB     500 mg 100 mL/hr over 60 Minutes Intravenous Every 24 hours 04/12/18 2036     04/12/18 2100  piperacillin-tazobactam (ZOSYN) IVPB 3.375 g     3.375 g 12.5 mL/hr over 240 Minutes Intravenous Every 8 hours 04/12/18 2034     04/12/18 0345  vancomycin (VANCOCIN) IVPB 1000 mg/200 mL premix     1,000 mg 200 mL/hr over 60 Minutes Intravenous  Once 04/12/18 0332 04/12/18 0453   04/12/18 0345  piperacillin-tazobactam (ZOSYN) IVPB 3.375 g     3.375 g 100 mL/hr over 30 Minutes Intravenous  Once 04/12/18 0332 04/12/18 0424        Objective:   Vitals:   04/13/18 1720 04/13/18 2144 04/14/18 0541 04/14/18 0837  BP: 139/61 (!) 157/72 (!) 156/62 (!) 156/70  Pulse:  91 91 91  Resp:  20 (!) 22   Temp:  99.4 F (37.4 C) 99.2 F (37.3 C)   TempSrc:  Oral Oral   SpO2:  96% 97%   Weight:      Height:        Wt Readings from Last 3 Encounters:  04/12/18 59.3 kg  03/23/18 61.2 kg  09/24/17 61.2 kg     Intake/Output Summary (Last 24 hours) at 04/14/2018 1302 Last data filed at 04/14/2018 0500 Gross per 24 hour  Intake  393.48 ml  Output 2100 ml  Net -1706.52 ml     Physical Exam  Frail elderly female, laying in bed in no apparent distress  Symmetrical Chest wall movement, Good air movement bilaterally, CTAB RRR,No Gallops,Rubs or new Murmurs, No Parasternal Heave +ve B.Sounds, Abd Soft, No tenderness, No rebound - guarding or rigidity. 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    Data Review:    CBC Recent Labs  Lab 04/11/18 2125 04/12/18 0948 04/13/18 0347 04/14/18 0454  WBC 15.3* 12.4* 11.5* 12.4*  HGB 9.3* 8.8* 8.7* 9.6*  HCT 30.3* 28.5* 28.6* 31.4*  PLT 444* 363 386 413*  MCV 86.6 85.3 86.1 85.8  MCH 26.6 26.3 26.2 26.2  MCHC 30.7 30.9 30.4 30.6  RDW 16.2* 16.2* 16.5* 16.6*  LYMPHSABS 1.6  --   --   --   MONOABS 0.7  --   --   --   EOSABS 0.3  --   --   --   BASOSABS 0.0  --   --   --     Chemistries  Recent Labs  Lab 04/11/18 2125 04/12/18 0948 04/14/18 0454  NA 137 139 139  K 3.8 3.3* 4.5  CL 99 106 106  CO2 25 24 26   GLUCOSE 205* 107* 190*  BUN 15 11 11   CREATININE 1.11* 1.03* 1.08*  CALCIUM 9.1 8.0* 8.1*  AST 28 26  --   ALT 20 18  --   ALKPHOS 102 77  --  BILITOT 0.6 0.6  --    ------------------------------------------------------------------------------------------------------------------ No results for input(s): CHOL, HDL, LDLCALC, TRIG, CHOLHDL, LDLDIRECT in the last 72 hours.  Lab Results  Component Value Date   HGBA1C 7.4 (H) 08/02/2017   ------------------------------------------------------------------------------------------------------------------ No results for input(s): TSH, T4TOTAL, T3FREE, THYROIDAB in the last 72 hours.  Invalid input(s): FREET3 ------------------------------------------------------------------------------------------------------------------ No results for input(s): VITAMINB12, FOLATE, FERRITIN, TIBC, IRON,  RETICCTPCT in the last 72 hours.  Coagulation profile Recent Labs  Lab 04/12/18 0638  INR 1.09    Recent Labs    04/12/18 0510  DDIMER 10.28*    Cardiac Enzymes Recent Labs  Lab 04/12/18 0510 04/12/18 0948  TROPONINI <0.03 0.03*   ------------------------------------------------------------------------------------------------------------------    Component Value Date/Time   BNP 295.2 (H) 10/21/2016 1437    Inpatient Medications  Scheduled Meds: . amLODipine  5 mg Oral Daily  . atorvastatin  40 mg Oral Daily  . cholecalciferol  1,000 Units Oral Daily  . feeding supplement (PRO-STAT SUGAR FREE 64)  30 mL Oral BID  . folic acid  0.5 mg Oral Daily  . insulin aspart  0-9 Units Subcutaneous Q4H  . losartan  50 mg Oral Daily  . metoprolol tartrate  50 mg Oral BID WC  . pantoprazole  40 mg Oral Daily  . pramipexole  0.5 mg Oral QHS   Continuous Infusions: . sodium chloride Stopped (04/14/18 0355)  . piperacillin-tazobactam (ZOSYN)  IV 3.375 g (04/14/18 1224)  . vancomycin 500 mg (04/14/18 9811)   PRN Meds:.acetaminophen **OR** acetaminophen, guaiFENesin, psyllium, traMADol  Micro Results Recent Results (from the past 240 hour(s))  Culture, blood (routine x 2)     Status: None (Preliminary result)   Collection Time: 04/12/18  5:10 AM  Result Value Ref Range Status   Specimen Description BLOOD RIGHT ARM  Final   Special Requests   Final    BOTTLES DRAWN AEROBIC AND ANAEROBIC Blood Culture adequate volume   Culture   Final    NO GROWTH 1 DAY Performed at [facility], [place] [address] [place], [place] [address]    Report Status PENDING  Incomplete  Surgical pcr screen     Status: Abnormal   Collection Time: 04/12/18  5:19 AM  Result Value Ref Range Status   MRSA, PCR NEGATIVE NEGATIVE Final   Staphylococcus aureus POSITIVE (A) NEGATIVE Final    Comment: (NOTE) The Xpert SA Assay (FDA approved for NASAL specimens in patients 20 years of age and older), is  one component of a comprehensive surveillance program. It is not intended to diagnose infection nor to guide or monitor treatment. Performed at [facility], [place] [address] [place], [place] [address]   Culture, blood (routine x 2)     Status: None (Preliminary result)   Collection Time: 04/12/18  5:25 AM  Result Value Ref Range Status   Specimen Description BLOOD RIGHT ANTECUBITAL  Final   Special Requests   Final    BOTTLES DRAWN AEROBIC AND ANAEROBIC Blood Culture adequate volume   Culture   Final    NO GROWTH 1 DAY Performed at [facility], [place] [address], [place], [place] [address]    Report Status PENDING  Incomplete    Radiology Reports Dg Chest 2 View  Result Date: 04/11/2018 CLINICAL DATA:  Un witnessed fall today EXAM: CHEST - 2 VIEW COMPARISON:  08/09/2017 FINDINGS: Cardiac shadows within normal limits. No focal infiltrate or sizable effusion is seen. The lungs are well aerated with mild  interstitial changes stable from the prior study. No acute bony abnormality is seen. Chronic compression deformity in the lower thoracic/upper lumbar spine is seen. IMPRESSION: Chronic changes without acute abnormality. Electronically Signed   By: Inez Catalina M.D.   On: 04/11/2018 20:54   Dg Pelvis 1-2 Views  Result Date: 04/11/2018 CLINICAL DATA:  Recent fall with pelvic pain, initial encounter EXAM: PELVIS - 1-2 VIEW COMPARISON:  08/01/2017 FINDINGS: Pelvic ring is intact. No acute fracture or dislocation is seen. Mild degenerative changes of the hip joints are noted bilaterally. IMPRESSION: No acute abnormality noted. Electronically Signed   By: Inez Catalina M.D.   On: 04/11/2018 20:55   Ct Head Wo Contrast  Result Date: 04/11/2018 CLINICAL DATA:  Head trauma. Fall this morning. EXAM: CT HEAD WITHOUT CONTRAST CT CERVICAL SPINE WITHOUT CONTRAST TECHNIQUE: Multidetector CT imaging of the head and cervical spine was performed following the standard protocol without intravenous  contrast. Multiplanar CT image reconstructions of the cervical spine were also generated. COMPARISON:  08/02/2017 FINDINGS: CT HEAD FINDINGS Brain: No evidence of acute infarction, hemorrhage, hydrocephalus, extra-axial collection or mass lesion/mass effect. There is mild diffuse low-attenuation within the subcortical and periventricular white matter compatible with chronic microvascular disease. Prominence of the sulci and ventricles compatible with brain atrophy. Vascular: No hyperdense vessel or unexpected calcification. Skull: Normal. Negative for fracture or focal lesion. Sinuses/Orbits: No acute finding. Other: None. CT CERVICAL SPINE FINDINGS Alignment: There is reversal of normal cervical lordosis. There is an anterolisthesis of C2 on C3 measuring 4 mm. Skull base and vertebrae: No acute fracture. No primary bone lesion or focal pathologic process. Soft tissues and spinal canal: No prevertebral fluid or swelling. No visible canal hematoma. Disc levels: Previous posterior laminectomy and decompression of C3 through C7. Solid fusion of C5 through C7 noted. Marked degenerative disc disease noted at C3-4, C4-5 and C7-T1. Upper chest: Calcified granuloma noted in the medial right upper lobe. Other: None IMPRESSION: 1. No acute intracranial abnormalities. Chronic small vessel ischemic disease and brain atrophy. 2. No evidence of acute cervical spine fracture. 3. Multilevel degenerative disc disease and cervical spondylosis, as above. 4. Extensive cervical spondylosis is identified status post posterior decompression of C3 through C7. There is an anterolisthesis of C2 on C3 which is felt to likely reflect changes secondary to chronic spondylosis. Electronically Signed   By: Kerby Moors M.D.   On: 04/11/2018 21:23   Ct Cervical Spine Wo Contrast  Result Date: 04/11/2018 CLINICAL DATA:  Head trauma. Fall this morning. EXAM: CT HEAD WITHOUT CONTRAST CT CERVICAL SPINE WITHOUT CONTRAST TECHNIQUE: Multidetector CT  imaging of the head and cervical spine was performed following the standard protocol without intravenous contrast. Multiplanar CT image reconstructions of the cervical spine were also generated. COMPARISON:  08/02/2017 FINDINGS: CT HEAD FINDINGS Brain: No evidence of acute infarction, hemorrhage, hydrocephalus, extra-axial collection or mass lesion/mass effect. There is mild diffuse low-attenuation within the subcortical and periventricular white matter compatible with chronic microvascular disease. Prominence of the sulci and ventricles compatible with brain atrophy. Vascular: No hyperdense vessel or unexpected calcification. Skull: Normal. Negative for fracture or focal lesion. Sinuses/Orbits: No acute finding. Other: None. CT CERVICAL SPINE FINDINGS Alignment: There is reversal of normal cervical lordosis. There is an anterolisthesis of C2 on C3 measuring 4 mm. Skull base and vertebrae: No acute fracture. No primary bone lesion or focal pathologic process. Soft tissues and spinal canal: No prevertebral fluid or swelling. No visible canal hematoma. Disc levels: Previous posterior laminectomy and  decompression of C3 through C7. Solid fusion of C5 through C7 noted. Marked degenerative disc disease noted at C3-4, C4-5 and C7-T1. Upper chest: Calcified granuloma noted in the medial right upper lobe. Other: None IMPRESSION: 1. No acute intracranial abnormalities. Chronic small vessel ischemic disease and brain atrophy. 2. No evidence of acute cervical spine fracture. 3. Multilevel degenerative disc disease and cervical spondylosis, as above. 4. Extensive cervical spondylosis is identified status post posterior decompression of C3 through C7. There is an anterolisthesis of C2 on C3 which is felt to likely reflect changes secondary to chronic spondylosis. Electronically Signed   By: Kerby Moors M.D.   On: 04/11/2018 21:23     Phillips Climes M.D on 04/14/2018 at 1:02 PM  Between 7am to 7pm - Pager -  [phone]  After 7pm go to [email or web address] - password [facility]  [facility] -  Office  [phone]

## 2018-04-15 LAB — CBC
HEMATOCRIT: 28.9 % — AB (ref 36.0–46.0)
Hemoglobin: 8.8 g/dL — ABNORMAL LOW (ref 12.0–15.0)
MCH: 26.3 pg (ref 26.0–34.0)
MCHC: 30.4 g/dL (ref 30.0–36.0)
MCV: 86.5 fL (ref 78.0–100.0)
Platelets: 414 10*3/uL — ABNORMAL HIGH (ref 150–400)
RBC: 3.34 MIL/uL — AB (ref 3.87–5.11)
RDW: 16.5 % — ABNORMAL HIGH (ref 11.5–15.5)
WBC: 10.8 10*3/uL — AB (ref 4.0–10.5)

## 2018-04-15 LAB — GLUCOSE, CAPILLARY
GLUCOSE-CAPILLARY: 176 mg/dL — AB (ref 70–99)
GLUCOSE-CAPILLARY: 150 mg/dL — AB (ref 70–99)
GLUCOSE-CAPILLARY: 232 mg/dL — AB (ref 70–99)
GLUCOSE-CAPILLARY: 327 mg/dL — AB (ref 70–99)
Glucose-Capillary: 164 mg/dL — ABNORMAL HIGH (ref 70–99)
Glucose-Capillary: 209 mg/dL — ABNORMAL HIGH (ref 70–99)

## 2018-04-15 LAB — BASIC METABOLIC PANEL
Anion gap: 9 (ref 5–15)
BUN: 17 mg/dL (ref 8–23)
CO2: 26 mmol/L (ref 22–32)
Calcium: 8.1 mg/dL — ABNORMAL LOW (ref 8.9–10.3)
Chloride: 103 mmol/L (ref 98–111)
Creatinine, Ser: 1.11 mg/dL — ABNORMAL HIGH (ref 0.44–1.00)
GFR, EST AFRICAN AMERICAN: 53 mL/min — AB (ref >60)
GFR, EST NON AFRICAN AMERICAN: 46 mL/min — AB (ref >60)
Glucose, Bld: 189 mg/dL — ABNORMAL HIGH (ref 70–99)
POTASSIUM: 4 mmol/L (ref 3.5–5.1)
Sodium: 138 mmol/L (ref 135–145)

## 2018-04-15 MED ORDER — ENSURE ENLIVE PO LIQD
237.0000 mL | Freq: Two times a day (BID) | ORAL | Status: DC
  Administered 2018-04-15 – 2018-04-18 (×5): 237 mL via ORAL

## 2018-04-15 MED ORDER — GUAIFENESIN ER 600 MG PO TB12
1200.0000 mg | ORAL_TABLET | Freq: Two times a day (BID) | ORAL | Status: DC
  Administered 2018-04-15 – 2018-04-19 (×8): 1200 mg via ORAL
  Filled 2018-04-15 (×8): qty 2

## 2018-04-15 NOTE — Care Management Important Message (Signed)
Important Message  Patient Details  Name: Sara Griffith MRN: [id number] Date of Birth: [birth year]   Medicare Important Message Given:  Yes    [name] [name] 04/15/2018, 4:28 PM

## 2018-04-15 NOTE — Progress Notes (Signed)
PROGRESS NOTE                                                                                                                                                                                                             Patient Demographics:    Sara Griffith, is a 82 y.o. female, DOB - October 20, 1935, AQT:[id number]  Admit date - 04/11/2018   Admitting Physician Jani Gravel, MD  Outpatient Primary MD for the patient is Kathyrn Lass, MD  LOS - 3   Chief Complaint  Patient presents with  . Fall       Brief Narrative    Sara Griffith a5 y.o.female,w hypertension, dm2, ckd stage 3, severe protein calorie malnutrition,intracranial hemorrhage,pvd, chronic osteomyelitis of the right foot, presented with a fall where her legs gave out and she hit her lip and head. Pt denies syncope, cp, palp, sob, n/v, abd pain, diarrhea, brbpr.  In ED pt was noted to have increase in redness of the bilateral distal lower ext .  She is being followed by Dr. Sharol Given for known subacute osteomyelitis of the right foot, she had a plan for transtibial amputation 2 weeks as an outpatient.   Subjective:    Sara Griffith today has, No headache, No chest pain, No abdominal pain , she reports good bowel movement, reports some congestion   Assessment  & Plan :    Principal Problem:   Cellulitis Active Problems:   HTN (hypertension)   Type 2 diabetes mellitus without complication, without long-term current use of insulin (HCC)   Skin ulcer (HCC)   Osteomyelitis of right foot (HCC)   Subacute Osteomyelitis of right ankle and foot/cellulitis/nonhealing wound -This appears to be chronic problem, leukocytosis on admission 15.3, has been improved on broad-spectrum antibiotic, for now continue with vancomycin and Zosyn, follow on blood cultures, remains negative . -Management per orthopedic team, patient is well-known to Dr. Sharol Given from orthopedic service, by reviewing outpatient  orthopedic records, she does appear to be having subacute osteomyelitis with ulceration and cellulitis, plan was for transtibial amputation in 2 weeks as an outpatient, orthopedic will proceed tomorrow with surgery .  Sinus tachycardia -due to infection, resolved with antibiotic and hydration   Diabetes mellitus type 2 -Overall CBG acceptable on sliding scale, given she will be n.p.o. after midnight I will hold on starting low-dose Lantus. -  continue to hold Tradjenta,   Hypertension  -Acceptable, continue with home regimen of amlodipine, losartan and metoprolol   Hyperlipidemia - Cont Lipitor 40mg  po qhs  Prior h/o intracranial hemorrhage - not taking Keppra anymore, ICH was > 6 mos ago  RLS  - Cont Mirapex 0.5mg  po qhs  Severe protein calorie malnutrition - prostat 22mL po bid - will start on Ensure    Code Status : DNR  Family Communication  : None at bedside  Disposition Plan  : will need SNF placement  Consults  :  Ortho, dr Sharol Given  Procedures  : None  DVT Prophylaxis  :  [place] lovenox  Lab Results  Component Value Date   PLT 414 (H) 04/15/2018    Antibiotics  :    Anti-infectives (From admission, onward)   Start     Dose/Rate Route Frequency Ordered Stop   04/13/18 0600  vancomycin (VANCOCIN) 500 mg in sodium chloride 0.9 % 100 mL IVPB     500 mg 100 mL/hr over 60 Minutes Intravenous Every 24 hours 04/12/18 2036     04/12/18 2100  piperacillin-tazobactam (ZOSYN) IVPB 3.375 g     3.375 g 12.5 mL/hr over 240 Minutes Intravenous Every 8 hours 04/12/18 2034     04/12/18 0345  vancomycin (VANCOCIN) IVPB 1000 mg/200 mL premix     1,000 mg 200 mL/hr over 60 Minutes Intravenous  Once 04/12/18 0332 04/12/18 0453   04/12/18 0345  piperacillin-tazobactam (ZOSYN) IVPB 3.375 g     3.375 g 100 mL/hr over 30 Minutes Intravenous  Once 04/12/18 0332 04/12/18 0424        Objective:   Vitals:   04/14/18 1711 04/14/18 2132 04/15/18 0554 04/15/18 0830  BP:  (!) 128/59 102/80 (!) 146/63 (!) 142/69  Pulse: 84 81 84 82  Resp:  19 19   Temp:  98.7 F (37.1 C) 98.9 F (37.2 C)   TempSrc:   Oral   SpO2:  95% 95%   Weight:      Height:        Wt Readings from Last 3 Encounters:  04/12/18 59.3 kg  03/23/18 61.2 kg  09/24/17 61.2 kg    No intake or output data in the 24 hours ending 04/15/18 1029   Physical Exam  Frail elderly female, laying in bed, comfortable, no apparent distress  Awake Alert, Oriented X 3, No new F.N deficits, Normal affect  Symmetrical Chest wall movement, Good air movement bilaterally, CTAB RRR,No Gallops,Rubs or new Murmurs, No Parasternal Heave +ve B.Sounds, Abd Soft, No tenderness, No rebound - guarding or rigidity. 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   Data Review:    CBC Recent Labs  Lab 04/11/18 2125 04/12/18 0948 04/13/18 0347 04/14/18 0454 04/15/18 0441  WBC 15.3* 12.4* 11.5* 12.4* 10.8*  HGB 9.3* 8.8* 8.7* 9.6* 8.8*  HCT 30.3* 28.5* 28.6* 31.4* 28.9*  PLT 444* 363 386 413* 414*  MCV 86.6 85.3 86.1 85.8 86.5  MCH 26.6 26.3 26.2 26.2 26.3  MCHC 30.7 30.9 30.4 30.6 30.4  RDW 16.2* 16.2* 16.5* 16.6* 16.5*  LYMPHSABS 1.6  --   --   --   --   MONOABS 0.7  --   --   --   --   EOSABS 0.3  --   --   --   --  BASOSABS 0.0  --   --   --   --     Chemistries  Recent Labs  Lab 04/11/18 2125 04/12/18 0948 04/14/18 0454 04/15/18 0441  NA 137 139 139 138  K 3.8 3.3* 4.5 4.0  CL 99 106 106 103  CO2 25 24 26 26   GLUCOSE 205* 107* 190* 189*  BUN 15 11 11 17   CREATININE 1.11* 1.03* 1.08* 1.11*  CALCIUM 9.1 8.0* 8.1* 8.1*  AST 28 26  --   --   ALT 20 18  --   --   ALKPHOS 102 77  --   --   BILITOT 0.6 0.6  --   --     ------------------------------------------------------------------------------------------------------------------ No results for input(s): CHOL, HDL, LDLCALC, TRIG, CHOLHDL, LDLDIRECT in the last 72 hours.  Lab Results  Component Value Date   HGBA1C 7.4 (H) 08/02/2017   ------------------------------------------------------------------------------------------------------------------ No results for input(s): TSH, T4TOTAL, T3FREE, THYROIDAB in the last 72 hours.  Invalid input(s): FREET3 ------------------------------------------------------------------------------------------------------------------ No results for input(s): VITAMINB12, FOLATE, FERRITIN, TIBC, IRON, RETICCTPCT in the last 72 hours.  Coagulation profile Recent Labs  Lab 04/12/18 0638  INR 1.09    No results for input(s): DDIMER in the last 72 hours.  Cardiac Enzymes Recent Labs  Lab 04/12/18 0510 04/12/18 0948  TROPONINI <0.03 0.03*   ------------------------------------------------------------------------------------------------------------------    Component Value Date/Time   BNP 295.2 (H) 10/21/2016 1437    Inpatient Medications  Scheduled Meds: . amLODipine  5 mg Oral Daily  . atorvastatin  40 mg Oral Daily  . cholecalciferol  1,000 Units Oral Daily  . enoxaparin (LOVENOX) injection  30 mg Subcutaneous Daily  . feeding supplement (ENSURE ENLIVE)  237 mL Oral BID BM  . feeding supplement (PRO-STAT SUGAR FREE 64)  30 mL Oral BID  . folic acid  0.5 mg Oral Daily  . guaiFENesin  1,200 mg Oral BID  . insulin aspart  0-9 Units Subcutaneous Q4H  . losartan  50 mg Oral Daily  . metoprolol tartrate  50 mg Oral BID WC  . pantoprazole  40 mg Oral Daily  . pramipexole  0.5 mg Oral QHS   Continuous Infusions: . sodium chloride 10 mL/hr at 04/14/18 2158  . piperacillin-tazobactam (ZOSYN)  IV 3.375 g (04/15/18 0655)  . vancomycin 500 mg (04/15/18 0657)   PRN Meds:.acetaminophen **OR** acetaminophen,  psyllium, traMADol  Micro Results Recent Results (from the past 240 hour(s))  Culture, blood (routine x 2)     Status: None (Preliminary result)   Collection Time: 04/12/18  5:10 AM  Result Value Ref Range Status   Specimen Description BLOOD RIGHT ARM  Final   Special Requests   Final    BOTTLES DRAWN AEROBIC AND ANAEROBIC Blood Culture adequate volume   Culture   Final    NO GROWTH 2 DAYS Performed at [facility], [place] [address] [place], [place] [address]    Report Status PENDING  Incomplete  Surgical pcr screen     Status: Abnormal   Collection Time: 04/12/18  5:19 AM  Result Value Ref Range Status   MRSA, PCR NEGATIVE NEGATIVE Final   Staphylococcus aureus POSITIVE (A) NEGATIVE Final    Comment: (NOTE) The Xpert SA Assay (FDA approved for NASAL specimens in patients 62 years of age and older), is one component of a comprehensive surveillance program. It is not intended to diagnose infection nor to guide or monitor treatment. Performed at [facility], [place],  [place] [address]   Culture, blood (routine x 2)     Status: None (Preliminary result)   Collection Time: 04/12/18  5:25 AM  Result Value Ref Range Status   Specimen Description BLOOD RIGHT ANTECUBITAL  Final   Special Requests   Final    BOTTLES DRAWN AEROBIC AND ANAEROBIC Blood Culture adequate volume   Culture   Final    NO GROWTH 2 DAYS Performed at [facility], [address], [place], [place] [address]    Report Status PENDING  Incomplete    Radiology Reports Dg Chest 2 View  Result Date: 04/11/2018 CLINICAL DATA:  Un witnessed fall today EXAM: CHEST - 2 VIEW COMPARISON:  08/09/2017 FINDINGS: Cardiac shadows within normal limits. No focal infiltrate or sizable effusion is seen. The lungs are well aerated with mild interstitial changes stable from the prior study. No acute bony abnormality is seen. Chronic compression deformity in the lower thoracic/upper lumbar spine is  seen. IMPRESSION: Chronic changes without acute abnormality. Electronically Signed   By: Inez Catalina M.D.   On: 04/11/2018 20:54   Dg Pelvis 1-2 Views  Result Date: 04/11/2018 CLINICAL DATA:  Recent fall with pelvic pain, initial encounter EXAM: PELVIS - 1-2 VIEW COMPARISON:  08/01/2017 FINDINGS: Pelvic ring is intact. No acute fracture or dislocation is seen. Mild degenerative changes of the hip joints are noted bilaterally. IMPRESSION: No acute abnormality noted. Electronically Signed   By: Inez Catalina M.D.   On: 04/11/2018 20:55   Ct Head Wo Contrast  Result Date: 04/11/2018 CLINICAL DATA:  Head trauma. Fall this morning. EXAM: CT HEAD WITHOUT CONTRAST CT CERVICAL SPINE WITHOUT CONTRAST TECHNIQUE: Multidetector CT imaging of the head and cervical spine was performed following the standard protocol without intravenous contrast. Multiplanar CT image reconstructions of the cervical spine were also generated. COMPARISON:  08/02/2017 FINDINGS: CT HEAD FINDINGS Brain: No evidence of acute infarction, hemorrhage, hydrocephalus, extra-axial collection or mass lesion/mass effect. There is mild diffuse low-attenuation within the subcortical and periventricular white matter compatible with chronic microvascular disease. Prominence of the sulci and ventricles compatible with brain atrophy. Vascular: No hyperdense vessel or unexpected calcification. Skull: Normal. Negative for fracture or focal lesion. Sinuses/Orbits: No acute finding. Other: None. CT CERVICAL SPINE FINDINGS Alignment: There is reversal of normal cervical lordosis. There is an anterolisthesis of C2 on C3 measuring 4 mm. Skull base and vertebrae: No acute fracture. No primary bone lesion or focal pathologic process. Soft tissues and spinal canal: No prevertebral fluid or swelling. No visible canal hematoma. Disc levels: Previous posterior laminectomy and decompression of C3 through C7. Solid fusion of C5 through C7 noted. Marked degenerative disc  disease noted at C3-4, C4-5 and C7-T1. Upper chest: Calcified granuloma noted in the medial right upper lobe. Other: None IMPRESSION: 1. No acute intracranial abnormalities. Chronic small vessel ischemic disease and brain atrophy. 2. No evidence of acute cervical spine fracture. 3. Multilevel degenerative disc disease and cervical spondylosis, as above. 4. Extensive cervical spondylosis is identified status post posterior decompression of C3 through C7. There is an anterolisthesis of C2 on C3 which is felt to likely reflect changes secondary to chronic spondylosis. Electronically Signed   By: Kerby Moors M.D.   On: 04/11/2018 21:23   Ct Cervical Spine Wo Contrast  Result Date: 04/11/2018 CLINICAL DATA:  Head trauma. Fall this morning. EXAM: CT HEAD WITHOUT CONTRAST CT CERVICAL SPINE WITHOUT CONTRAST TECHNIQUE: Multidetector CT imaging of the head and cervical spine was performed following the standard  protocol without intravenous contrast. Multiplanar CT image reconstructions of the cervical spine were also generated. COMPARISON:  08/02/2017 FINDINGS: CT HEAD FINDINGS Brain: No evidence of acute infarction, hemorrhage, hydrocephalus, extra-axial collection or mass lesion/mass effect. There is mild diffuse low-attenuation within the subcortical and periventricular white matter compatible with chronic microvascular disease. Prominence of the sulci and ventricles compatible with brain atrophy. Vascular: No hyperdense vessel or unexpected calcification. Skull: Normal. Negative for fracture or focal lesion. Sinuses/Orbits: No acute finding. Other: None. CT CERVICAL SPINE FINDINGS Alignment: There is reversal of normal cervical lordosis. There is an anterolisthesis of C2 on C3 measuring 4 mm. Skull base and vertebrae: No acute fracture. No primary bone lesion or focal pathologic process. Soft tissues and spinal canal: No prevertebral fluid or swelling. No visible canal hematoma. Disc levels: Previous posterior  laminectomy and decompression of C3 through C7. Solid fusion of C5 through C7 noted. Marked degenerative disc disease noted at C3-4, C4-5 and C7-T1. Upper chest: Calcified granuloma noted in the medial right upper lobe. Other: None IMPRESSION: 1. No acute intracranial abnormalities. Chronic small vessel ischemic disease and brain atrophy. 2. No evidence of acute cervical spine fracture. 3. Multilevel degenerative disc disease and cervical spondylosis, as above. 4. Extensive cervical spondylosis is identified status post posterior decompression of C3 through C7. There is an anterolisthesis of C2 on C3 which is felt to likely reflect changes secondary to chronic spondylosis. Electronically Signed   By: Kerby Moors M.D.   On: 04/11/2018 21:23     Phillips Climes M.D on 04/15/2018 at 10:29 AM  Between 7am to 7pm - Pager - [phone]  After 7pm go to [email or web address] - password [facility]  [facility] -  Office  [phone]

## 2018-04-15 NOTE — Progress Notes (Signed)
Pharmacy Antibiotic Note  Sara Griffith is a 82 y.o. female admitted on 04/11/2018 with RLE cellulitis.  Pharmacy has been consulted for Vancomycin / Zosyn dosing.  Renal function stable, afebrile, WBC down to 10.8.   Plan: Vanc 500mg  IV Q24H Zosyn EID 3.375gm IV Q8H Monitor renal fxn, clinical progress F/U abx plan post amputation to decide on whether to check VT   Height: 4\' 11"  (149.9 cm) Weight: 130 lb 11.7 oz (59.3 kg) IBW/kg (Calculated) : 43.2  Temp (24hrs), Avg:98.6 F (37 C), Min:98.2 F (36.8 C), Max:98.9 F (37.2 C)  Recent Labs  Lab 04/11/18 2125 04/12/18 0948 04/13/18 0347 04/14/18 0454 04/15/18 0441  WBC 15.3* 12.4* 11.5* 12.4* 10.8*  CREATININE 1.11* 1.03*  --  1.08* 1.11*    Estimated Creatinine Clearance: 31.7 mL/min (A) (by C-G formula based on SCr of 1.11 mg/dL (H)).    Allergies  Allergen Reactions  . Cymbalta [Duloxetine Hcl] Swelling    SWELLING REACTION UNSPECIFIED   . Librax [Chlordiazepoxide-Clidinium] Swelling    SWELLING REACTION UNSPECIFIED   . Lyrica [Pregabalin] Swelling    SWELLING REACTION UNSPECIFIED   . Naproxen Swelling    SWELLING REACTION UNSPECIFIED     Vanc 8/12 >> Zosyn 8/12 >>  8/12 MRSA PCR - negative 8/12 BCx - NGTD   Fontaine Kossman D. Mina Marble, PharmD, BCPS, [place] 04/15/2018, 8:43 AM

## 2018-04-16 ENCOUNTER — Inpatient Hospital Stay (HOSPITAL_COMMUNITY): Payer: Medicare HMO | Admitting: Anesthesiology

## 2018-04-16 ENCOUNTER — Inpatient Hospital Stay (HOSPITAL_COMMUNITY): Admission: RE | Admit: 2018-04-16 | Payer: Medicare HMO | Source: Ambulatory Visit | Admitting: Orthopedic Surgery

## 2018-04-16 ENCOUNTER — Encounter (HOSPITAL_COMMUNITY)
Admission: EM | Disposition: A | Discharge status: Skilled Nursing Facility | Payer: Self-pay | Source: Home / Self Care | Attending: Internal Medicine

## 2018-04-16 DIAGNOSIS — M86271 Subacute osteomyelitis, right ankle and foot: Secondary | ICD-10-CM

## 2018-04-16 HISTORY — PX: AMPUTATION: SHX166

## 2018-04-16 LAB — GLUCOSE, CAPILLARY
Glucose-Capillary: 132 mg/dL — ABNORMAL HIGH (ref 70–99)
Glucose-Capillary: 155 mg/dL — ABNORMAL HIGH (ref 70–99)
Glucose-Capillary: 164 mg/dL — ABNORMAL HIGH (ref 70–99)
Glucose-Capillary: 164 mg/dL — ABNORMAL HIGH (ref 70–99)
Glucose-Capillary: 173 mg/dL — ABNORMAL HIGH (ref 70–99)
Glucose-Capillary: 253 mg/dL — ABNORMAL HIGH (ref 70–99)

## 2018-04-16 SURGERY — AMPUTATION BELOW KNEE
Anesthesia: General | Laterality: Right

## 2018-04-16 MED ORDER — METOCLOPRAMIDE HCL 5 MG/ML IJ SOLN: 5.0000 mg | Freq: Three times a day (TID) | INTRAMUSCULAR | Status: DC | PRN

## 2018-04-16 MED ORDER — METHOCARBAMOL 1000 MG/10ML IJ SOLN
500.0000 mg | Freq: Four times a day (QID) | INTRAMUSCULAR | Status: DC | PRN
  Filled 2018-04-16: qty 5

## 2018-04-16 MED ORDER — CHLORHEXIDINE GLUCONATE 4 % EX LIQD
60.0000 mL | Freq: Once | CUTANEOUS | Status: AC
  Administered 2018-04-16: 4 via TOPICAL
  Filled 2018-04-16: qty 60

## 2018-04-16 MED ORDER — ONDANSETRON HCL 4 MG PO TABS: 4.0000 mg | ORAL_TABLET | Freq: Four times a day (QID) | ORAL | Status: DC | PRN

## 2018-04-16 MED ORDER — LACTATED RINGERS IV SOLN
INTRAVENOUS | Status: DC | PRN
  Administered 2018-04-16: 13:00:00 via INTRAVENOUS

## 2018-04-16 MED ORDER — FENTANYL CITRATE (PF) 100 MCG/2ML IJ SOLN
INTRAMUSCULAR | Status: DC | PRN
  Administered 2018-04-16: 100 ug via INTRAVENOUS

## 2018-04-16 MED ORDER — SODIUM CHLORIDE 0.9 % IV SOLN
INTRAVENOUS | Status: DC | PRN
  Administered 2018-04-16: 50 ug/min via INTRAVENOUS

## 2018-04-16 MED ORDER — BISACODYL 10 MG RE SUPP: 10.0000 mg | Freq: Every day | RECTAL | Status: DC | PRN

## 2018-04-16 MED ORDER — OXYCODONE HCL 5 MG PO TABS: 5.0000 mg | ORAL_TABLET | Freq: Once | ORAL | Status: DC | PRN

## 2018-04-16 MED ORDER — MORPHINE SULFATE (PF) 2 MG/ML IV SOLN: 0.5000 mg | INTRAVENOUS | Status: DC | PRN

## 2018-04-16 MED ORDER — HYDROCODONE-ACETAMINOPHEN 5-325 MG PO TABS: 1.0000 | ORAL_TABLET | ORAL | Status: DC | PRN

## 2018-04-16 MED ORDER — MUPIROCIN 2 % EX OINT: 1.0000 "application " | TOPICAL_OINTMENT | Freq: Two times a day (BID) | CUTANEOUS | Status: DC

## 2018-04-16 MED ORDER — DOCUSATE SODIUM 100 MG PO CAPS
100.0000 mg | ORAL_CAPSULE | Freq: Two times a day (BID) | ORAL | Status: DC
  Administered 2018-04-16 – 2018-04-19 (×6): 100 mg via ORAL
  Filled 2018-04-16 (×6): qty 1

## 2018-04-16 MED ORDER — CEFAZOLIN SODIUM-DEXTROSE 2-4 GM/100ML-% IV SOLN
INTRAVENOUS | Status: AC
  Filled 2018-04-16: qty 100

## 2018-04-16 MED ORDER — GABAPENTIN 300 MG PO CAPS
300.0000 mg | ORAL_CAPSULE | Freq: Three times a day (TID) | ORAL | Status: DC
  Administered 2018-04-16 – 2018-04-19 (×10): 300 mg via ORAL
  Filled 2018-04-16 (×9): qty 1

## 2018-04-16 MED ORDER — CEFAZOLIN SODIUM-DEXTROSE 2-4 GM/100ML-% IV SOLN
2.0000 g | INTRAVENOUS | Status: AC
  Administered 2018-04-16: 2 g via INTRAVENOUS
  Filled 2018-04-16: qty 100

## 2018-04-16 MED ORDER — EPHEDRINE SULFATE 50 MG/ML IJ SOLN
INTRAMUSCULAR | Status: DC | PRN
  Administered 2018-04-16: 10 mg via INTRAVENOUS

## 2018-04-16 MED ORDER — LACTATED RINGERS IV SOLN: INTRAVENOUS | Status: DC

## 2018-04-16 MED ORDER — POLYETHYLENE GLYCOL 3350 17 G PO PACK: 17.0000 g | PACK | Freq: Every day | ORAL | Status: DC | PRN

## 2018-04-16 MED ORDER — METHOCARBAMOL 500 MG PO TABS: 500.0000 mg | ORAL_TABLET | Freq: Four times a day (QID) | ORAL | Status: DC | PRN

## 2018-04-16 MED ORDER — METOCLOPRAMIDE HCL 10 MG PO TABS: 5.0000 mg | ORAL_TABLET | Freq: Three times a day (TID) | ORAL | Status: DC | PRN

## 2018-04-16 MED ORDER — HYDROCODONE-ACETAMINOPHEN 7.5-325 MG PO TABS
1.0000 | ORAL_TABLET | ORAL | Status: DC | PRN
  Administered 2018-04-18 – 2018-04-19 (×2): 1 via ORAL
  Filled 2018-04-16 (×2): qty 1

## 2018-04-16 MED ORDER — OXYCODONE HCL 5 MG/5ML PO SOLN: 5.0000 mg | Freq: Once | ORAL | Status: DC | PRN

## 2018-04-16 MED ORDER — ROPIVACAINE HCL 7.5 MG/ML IJ SOLN
INTRAMUSCULAR | Status: DC | PRN
  Administered 2018-04-16: 15 mL via PERINEURAL
  Administered 2018-04-16: 20 mL via PERINEURAL

## 2018-04-16 MED ORDER — ONDANSETRON HCL 4 MG/2ML IJ SOLN: 4.0000 mg | Freq: Four times a day (QID) | INTRAMUSCULAR | Status: DC | PRN

## 2018-04-16 MED ORDER — DIPHENHYDRAMINE HCL 25 MG PO CAPS
25.0000 mg | ORAL_CAPSULE | Freq: Four times a day (QID) | ORAL | Status: DC | PRN
  Administered 2018-04-16: 25 mg via ORAL
  Filled 2018-04-16: qty 1

## 2018-04-16 MED ORDER — ACETAMINOPHEN 325 MG PO TABS: 325.0000 mg | ORAL_TABLET | Freq: Four times a day (QID) | ORAL | Status: DC | PRN

## 2018-04-16 MED ORDER — SODIUM CHLORIDE 0.9 % IV SOLN: INTRAVENOUS | Status: DC

## 2018-04-16 MED ORDER — PROPOFOL 10 MG/ML IV BOLUS
INTRAVENOUS | Status: DC | PRN
  Administered 2018-04-16: 100 mg via INTRAVENOUS

## 2018-04-16 MED ORDER — FENTANYL CITRATE (PF) 100 MCG/2ML IJ SOLN
25.0000 ug | INTRAMUSCULAR | Status: DC | PRN
  Administered 2018-04-16: 50 ug via INTRAVENOUS

## 2018-04-16 MED ORDER — MAGNESIUM CITRATE PO SOLN: 1.0000 | Freq: Once | ORAL | Status: DC | PRN

## 2018-04-16 MED ORDER — ONDANSETRON HCL 4 MG/2ML IJ SOLN: 4.0000 mg | Freq: Once | INTRAMUSCULAR | Status: DC | PRN

## 2018-04-16 MED ORDER — PHENYLEPHRINE HCL 10 MG/ML IJ SOLN
INTRAMUSCULAR | Status: DC | PRN
  Administered 2018-04-16: 80 ug via INTRAVENOUS

## 2018-04-16 SURGICAL SUPPLY — 36 items
BENZOIN TINCTURE PRP APPL 2/3 (GAUZE/BANDAGES/DRESSINGS) ×12 IMPLANT
BLADE SAW RECIP 87.9 MT (BLADE) ×6 IMPLANT
BLADE SURG 21 STRL SS (BLADE) ×3 IMPLANT
CANISTER WOUND CARE 500ML ATS (WOUND CARE) ×3 IMPLANT
COVER SURGICAL LIGHT HANDLE (MISCELLANEOUS) ×3 IMPLANT
CUFF TOURNIQUET SINGLE 34IN LL (TOURNIQUET CUFF) ×3 IMPLANT
CUFF TOURNIQUET SINGLE 44IN (TOURNIQUET CUFF) IMPLANT
DRAPE INCISE IOBAN 66X45 STRL (DRAPES) ×9 IMPLANT
DRAPE U-SHAPE 47X51 STRL (DRAPES) ×3 IMPLANT
DRESSING PREVENA PLUS CUSTOM (GAUZE/BANDAGES/DRESSINGS) ×2 IMPLANT
DRSG PREVENA PLUS CUSTOM (GAUZE/BANDAGES/DRESSINGS) ×6
DURAPREP 26ML APPLICATOR (WOUND CARE) ×3 IMPLANT
ELECT REM PT RETURN 9FT ADLT (ELECTROSURGICAL) ×3
ELECTRODE REM PT RTRN 9FT ADLT (ELECTROSURGICAL) ×1 IMPLANT
GLOVE BIOGEL PI IND STRL 9 (GLOVE) ×1 IMPLANT
GLOVE BIOGEL PI INDICATOR 9 (GLOVE) ×2
GLOVE SURG ORTHO 9.0 STRL STRW (GLOVE) ×3 IMPLANT
GOWN STRL REUS W/ TWL XL LVL3 (GOWN DISPOSABLE) ×2 IMPLANT
GOWN STRL REUS W/TWL XL LVL3 (GOWN DISPOSABLE) ×4
KIT BASIN OR (CUSTOM PROCEDURE TRAY) ×3 IMPLANT
KIT TURNOVER KIT B (KITS) ×3 IMPLANT
MANIFOLD NEPTUNE II (INSTRUMENTS) ×3 IMPLANT
NS IRRIG 1000ML POUR BTL (IV SOLUTION) ×3 IMPLANT
PACK ORTHO EXTREMITY (CUSTOM PROCEDURE TRAY) ×3 IMPLANT
PAD ARMBOARD 7.5X6 YLW CONV (MISCELLANEOUS) ×3 IMPLANT
PREVENA RESTOR ARTHOFORM 46X30 (CANNISTER) ×3 IMPLANT
SPONGE LAP 18X18 X RAY DECT (DISPOSABLE) IMPLANT
STAPLER VISISTAT 35W (STAPLE) ×3 IMPLANT
STOCKINETTE IMPERVIOUS LG (DRAPES) ×3 IMPLANT
SUT SILK 2 0 (SUTURE) ×2
SUT SILK 2-0 18XBRD TIE 12 (SUTURE) ×1 IMPLANT
SUT VIC AB 1 CTX 27 (SUTURE) IMPLANT
TOWEL OR 17X26 10 PK STRL BLUE (TOWEL DISPOSABLE) ×3 IMPLANT
TUBE CONNECTING 12'X1/4 (SUCTIONS) ×1
TUBE CONNECTING 12X1/4 (SUCTIONS) ×2 IMPLANT
YANKAUER SUCT BULB TIP NO VENT (SUCTIONS) ×3 IMPLANT

## 2018-04-16 NOTE — Progress Notes (Signed)
PROGRESS NOTE                                                                                                                                                                                                             Patient Demographics:    Sara Griffith, is a 82 y.o. female, DOB - Oct 10, 1935, RDE:[id number]  Admit date - 04/11/2018   Admitting Physician Jani Gravel, MD  Outpatient Primary MD for the patient is Kathyrn Lass, MD  LOS - 4   Chief Complaint  Patient presents with  . Fall       Brief Narrative    [name],w hypertension, dm2, ckd stage 3, severe protein calorie malnutrition,intracranial hemorrhage,pvd, chronic osteomyelitis of the right foot, presented with a fall where her legs gave out and she hit her lip and head. Pt denies syncope, cp, palp, sob, n/v, abd pain, diarrhea, brbpr.  In ED pt was noted to have increase in redness of the bilateral distal lower ext .  She is being followed by Dr. Sharol Given for known subacute osteomyelitis of the right foot, she had a plan for transtibial amputation 2 weeks as an outpatient.   Subjective:    Sara Griffith today has, No headache, No chest pain, No abdominal pain , denies any complaints today  Assessment  & Plan :    Principal Problem:   Cellulitis Active Problems:   HTN (hypertension)   Type 2 diabetes mellitus without complication, without long-term current use of insulin (HCC)   Skin ulcer ([place])   Osteomyelitis of right foot (HCC)   Subacute Osteomyelitis of right ankle and foot/cellulitis/nonhealing wound -This appears to be chronic problem, leukocytosis on admission 15.3, has been improved on broad-spectrum antibiotic, for now continue with vancomycin and Zosyn, culture remains negative, likely will discontinue antibiotic in 24 hours after amputation. -Management per orthopedic team, patient is well-known to Dr. Sharol Given from orthopedic service, by reviewing  outpatient orthopedic records, she does appear to be having subacute osteomyelitis with ulceration and cellulitis, plan was for transtibial amputation in 2 weeks as an outpatient, plan for surgery today.  Sinus tachycardia -due to infection, resolved with antibiotic and hydration   Diabetes mellitus type 2 -Overall CBG acceptable on sliding scale, overall CBG are fluctuating, but overall controlled, given she is n.p.o., going for surgery today, will not start any  long-acting insulin. -  continue to hold Tradjenta,   Hypertension  -Acceptable, continue with home regimen of amlodipine, losartan and metoprolol   Hyperlipidemia - Cont Lipitor 40mg  po qhs  Prior h/o intracranial hemorrhage - not taking Keppra anymore, ICH was > 6 mos ago  RLS  - Cont Mirapex 0.5mg  po qhs  Severe protein calorie malnutrition -Continue with pro-stat and ensure    Code Status : DNR  Family Communication  : None at bedside  Disposition Plan  : will need SNF placement  Consults  :  Ortho, dr Sharol Given  Procedures  : None  DVT Prophylaxis  :  [place] lovenox  Lab Results  Component Value Date   PLT 414 (H) 04/15/2018    Antibiotics  :    Anti-infectives (From admission, onward)   Start     Dose/Rate Route Frequency Ordered Stop   04/16/18 0745  ceFAZolin (ANCEF) IVPB 2g/100 mL premix     2 g 200 mL/hr over 30 Minutes Intravenous On call to O.R. 04/16/18 0738 04/17/18 0559   04/13/18 0600  vancomycin (VANCOCIN) 500 mg in sodium chloride 0.9 % 100 mL IVPB     500 mg 100 mL/hr over 60 Minutes Intravenous Every 24 hours 04/12/18 2036     04/12/18 2100  piperacillin-tazobactam (ZOSYN) IVPB 3.375 g     3.375 g 12.5 mL/hr over 240 Minutes Intravenous Every 8 hours 04/12/18 2034     04/12/18 0345  vancomycin (VANCOCIN) IVPB 1000 mg/200 mL premix     1,000 mg 200 mL/hr over 60 Minutes Intravenous  Once 04/12/18 0332 04/12/18 0453   04/12/18 0345  piperacillin-tazobactam (ZOSYN) IVPB 3.375 g       3.375 g 100 mL/hr over 30 Minutes Intravenous  Once 04/12/18 0332 04/12/18 0424        Objective:   Vitals:   04/15/18 0830 04/15/18 1525 04/15/18 2129 04/16/18 0550  BP: (!) 142/69 (!) 128/56 (!) 131/55 (!) 156/61  Pulse: 82 85 76 86  Resp:  16 (!) 21 (!) 22  Temp:   98.5 F (36.9 C) 98.5 F (36.9 C)  TempSrc:   Oral Oral  SpO2:  95% 95% 96%  Weight:      Height:        Wt Readings from Last 3 Encounters:  04/12/18 59.3 kg  03/23/18 61.2 kg  09/24/17 61.2 kg     Intake/Output Summary (Last 24 hours) at 04/16/2018 0943 Last data filed at 04/16/2018 0442 Gross per 24 hour  Intake 984.07 ml  Output 400 ml  Net 584.07 ml     Physical Exam  Frail elderly female, laying in bed, comfortable, no apparent distress  Awake Alert, Oriented X 3, No new F.N deficits, Normal affect Symmetrical Chest wall movement, Good air movement bilaterally, CTAB RRR,No Gallops,Rubs or new Murmurs, No Parasternal Heave +ve B.Sounds, Abd Soft, No tenderness, No rebound - guarding or rigidity. 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   Data Review:    CBC Recent Labs  Lab 04/11/18 2125 04/12/18 0948 04/13/18 0347 04/14/18 0454 04/15/18 0441  WBC 15.3* 12.4* 11.5* 12.4* 10.8*  HGB 9.3* 8.8* 8.7* 9.6* 8.8*  HCT 30.3* 28.5* 28.6* 31.4* 28.9*  PLT 444* 363 386 413* 414*  MCV 86.6 85.3 86.1 85.8 86.5  MCH 26.6 26.3 26.2 26.2 26.3  MCHC 30.7 30.9 30.4 30.6 30.4  RDW 16.2* 16.2* 16.5* 16.6* 16.5*  LYMPHSABS 1.6  --   --   --   --   MONOABS 0.7  --   --   --   --   EOSABS 0.3  --   --   --   --   BASOSABS 0.0  --   --   --   --     Chemistries  Recent Labs  Lab 04/11/18 2125 04/12/18 0948 04/14/18 0454 04/15/18 0441   NA 137 139 139 138  K 3.8 3.3* 4.5 4.0  CL 99 106 106 103  CO2 25 24 26 26   GLUCOSE 205* 107* 190* 189*  BUN 15 11 11 17   CREATININE 1.11* 1.03* 1.08* 1.11*  CALCIUM 9.1 8.0* 8.1* 8.1*  AST 28 26  --   --   ALT 20 18  --   --   ALKPHOS 102 77  --   --   BILITOT 0.6 0.6  --   --    ------------------------------------------------------------------------------------------------------------------ No results for input(s): CHOL, HDL, LDLCALC, TRIG, CHOLHDL, LDLDIRECT in the last 72 hours.  Lab Results  Component Value Date   HGBA1C 7.4 (H) 08/02/2017   ------------------------------------------------------------------------------------------------------------------ No results for input(s): TSH, T4TOTAL, T3FREE, THYROIDAB in the last 72 hours.  Invalid input(s): FREET3 ------------------------------------------------------------------------------------------------------------------ No results for input(s): VITAMINB12, FOLATE, FERRITIN, TIBC, IRON, RETICCTPCT in the last 72 hours.  Coagulation profile Recent Labs  Lab 04/12/18 0638  INR 1.09    No results for input(s): DDIMER in the last 72 hours.  Cardiac Enzymes Recent Labs  Lab 04/12/18 0510 04/12/18 0948  TROPONINI <0.03 0.03*   ------------------------------------------------------------------------------------------------------------------    Component Value Date/Time   BNP 295.2 (H) 10/21/2016 1437    Inpatient Medications  Scheduled Meds: . amLODipine  5 mg Oral Daily  . atorvastatin  40 mg Oral Daily  . chlorhexidine  60 mL Topical Once  . cholecalciferol  1,000 Units Oral Daily  . enoxaparin (LOVENOX) injection  30 mg Subcutaneous Daily  . feeding supplement (ENSURE ENLIVE)  237 mL Oral BID BM  . feeding supplement (PRO-STAT SUGAR FREE 64)  30 mL Oral BID  . folic acid  0.5 mg Oral Daily  . guaiFENesin  1,200 mg Oral BID  . insulin aspart  0-9 Units Subcutaneous Q4H  . losartan  50 mg Oral Daily  .  metoprolol tartrate  50 mg Oral BID WC  . pantoprazole  40 mg Oral Daily  . pramipexole  0.5 mg Oral QHS   Continuous Infusions: . sodium chloride 10 mL/hr at 04/15/18 2127  .  ceFAZolin (ANCEF) IV    . piperacillin-tazobactam (ZOSYN)  IV 3.375 g (04/16/18 0605)  . vancomycin 500 mg (04/16/18 0606)   PRN Meds:.acetaminophen **OR** acetaminophen, psyllium, traMADol  Micro Results Recent Results (from the past 240 hour(s))  Culture, blood (routine x 2)     Status: None (Preliminary result)   Collection Time: 04/12/18  5:10 AM  Result Value Ref Range Status   Specimen Description BLOOD RIGHT ARM  Final   Special Requests   Final    BOTTLES DRAWN AEROBIC AND ANAEROBIC Blood Culture adequate volume   Culture   Final    NO GROWTH 3 DAYS Performed at [facility], [place] [address] [place],  [address]    Report Status PENDING  Incomplete  Surgical pcr screen  Status: Abnormal   Collection Time: 04/12/18  5:19 AM  Result Value Ref Range Status   MRSA, PCR NEGATIVE NEGATIVE Final   Staphylococcus aureus POSITIVE (A) NEGATIVE Final    Comment: (NOTE) The Xpert SA Assay (FDA approved for NASAL specimens in patients 3 years of age and older), is one component of a comprehensive surveillance program. It is not intended to diagnose infection nor to guide or monitor treatment. Performed at [facility], [place] [address], [place], [place] [address]   Culture, blood (routine x 2)     Status: None (Preliminary result)   Collection Time: 04/12/18  5:25 AM  Result Value Ref Range Status   Specimen Description BLOOD RIGHT ANTECUBITAL  Final   Special Requests   Final    BOTTLES DRAWN AEROBIC AND ANAEROBIC Blood Culture adequate volume   Culture   Final    NO GROWTH 3 DAYS Performed at [facility], [place] [address], [place], [place] [address]    Report Status PENDING  Incomplete    Radiology Reports Dg Chest 2 View  Result Date: 04/11/2018 CLINICAL DATA:  Un  witnessed fall today EXAM: CHEST - 2 VIEW COMPARISON:  08/09/2017 FINDINGS: Cardiac shadows within normal limits. No focal infiltrate or sizable effusion is seen. The lungs are well aerated with mild interstitial changes stable from the prior study. No acute bony abnormality is seen. Chronic compression deformity in the lower thoracic/upper lumbar spine is seen. IMPRESSION: Chronic changes without acute abnormality. Electronically Signed   By: Inez Catalina M.D.   On: 04/11/2018 20:54   Dg Pelvis 1-2 Views  Result Date: 04/11/2018 CLINICAL DATA:  Recent fall with pelvic pain, initial encounter EXAM: PELVIS - 1-2 VIEW COMPARISON:  08/01/2017 FINDINGS: Pelvic ring is intact. No acute fracture or dislocation is seen. Mild degenerative changes of the hip joints are noted bilaterally. IMPRESSION: No acute abnormality noted. Electronically Signed   By: Inez Catalina M.D.   On: 04/11/2018 20:55   Ct Head Wo Contrast  Result Date: 04/11/2018 CLINICAL DATA:  Head trauma. Fall this morning. EXAM: CT HEAD WITHOUT CONTRAST CT CERVICAL SPINE WITHOUT CONTRAST TECHNIQUE: Multidetector CT imaging of the head and cervical spine was performed following the standard protocol without intravenous contrast. Multiplanar CT image reconstructions of the cervical spine were also generated. COMPARISON:  08/02/2017 FINDINGS: CT HEAD FINDINGS Brain: No evidence of acute infarction, hemorrhage, hydrocephalus, extra-axial collection or mass lesion/mass effect. There is mild diffuse low-attenuation within the subcortical and periventricular white matter compatible with chronic microvascular disease. Prominence of the sulci and ventricles compatible with brain atrophy. Vascular: No hyperdense vessel or unexpected calcification. Skull: Normal. Negative for fracture or focal lesion. Sinuses/Orbits: No acute finding. Other: None. CT CERVICAL SPINE FINDINGS Alignment: There is reversal of normal cervical lordosis. There is an anterolisthesis of  C2 on C3 measuring 4 mm. Skull base and vertebrae: No acute fracture. No primary bone lesion or focal pathologic process. Soft tissues and spinal canal: No prevertebral fluid or swelling. No visible canal hematoma. Disc levels: Previous posterior laminectomy and decompression of C3 through C7. Solid fusion of C5 through C7 noted. Marked degenerative disc disease noted at C3-4, C4-5 and C7-T1. Upper chest: Calcified granuloma noted in the medial right upper lobe. Other: None IMPRESSION: 1. No acute intracranial abnormalities. Chronic small vessel ischemic disease and brain atrophy. 2. No evidence of acute cervical spine fracture. 3. Multilevel degenerative disc disease and cervical spondylosis, as above. 4. Extensive cervical spondylosis is identified  status post posterior decompression of C3 through C7. There is an anterolisthesis of C2 on C3 which is felt to likely reflect changes secondary to chronic spondylosis. Electronically Signed   By: Kerby Moors M.D.   On: 04/11/2018 21:23   Ct Cervical Spine Wo Contrast  Result Date: 04/11/2018 CLINICAL DATA:  Head trauma. Fall this morning. EXAM: CT HEAD WITHOUT CONTRAST CT CERVICAL SPINE WITHOUT CONTRAST TECHNIQUE: Multidetector CT imaging of the head and cervical spine was performed following the standard protocol without intravenous contrast. Multiplanar CT image reconstructions of the cervical spine were also generated. COMPARISON:  08/02/2017 FINDINGS: CT HEAD FINDINGS Brain: No evidence of acute infarction, hemorrhage, hydrocephalus, extra-axial collection or mass lesion/mass effect. There is mild diffuse low-attenuation within the subcortical and periventricular white matter compatible with chronic microvascular disease. Prominence of the sulci and ventricles compatible with brain atrophy. Vascular: No hyperdense vessel or unexpected calcification. Skull: Normal. Negative for fracture or focal lesion. Sinuses/Orbits: No acute finding. Other: None. CT  CERVICAL SPINE FINDINGS Alignment: There is reversal of normal cervical lordosis. There is an anterolisthesis of C2 on C3 measuring 4 mm. Skull base and vertebrae: No acute fracture. No primary bone lesion or focal pathologic process. Soft tissues and spinal canal: No prevertebral fluid or swelling. No visible canal hematoma. Disc levels: Previous posterior laminectomy and decompression of C3 through C7. Solid fusion of C5 through C7 noted. Marked degenerative disc disease noted at C3-4, C4-5 and C7-T1. Upper chest: Calcified granuloma noted in the medial right upper lobe. Other: None IMPRESSION: 1. No acute intracranial abnormalities. Chronic small vessel ischemic disease and brain atrophy. 2. No evidence of acute cervical spine fracture. 3. Multilevel degenerative disc disease and cervical spondylosis, as above. 4. Extensive cervical spondylosis is identified status post posterior decompression of C3 through C7. There is an anterolisthesis of C2 on C3 which is felt to likely reflect changes secondary to chronic spondylosis. Electronically Signed   By: Kerby Moors M.D.   On: 04/11/2018 21:23     Phillips Climes M.D on 04/16/2018 at 9:43 AM  Between 7am to 7pm - Pager - [phone]  After 7pm go to [email or web address] - password [facility]  [facility] -  Office  [phone]

## 2018-04-16 NOTE — Transfer of Care (Signed)
Immediate Anesthesia Transfer of Care Note  Patient: Sara Griffith  Procedure(s) Performed: RIGHT BELOW KNEE AMPUTATION (Right )  Patient Location: PACU  Anesthesia Type:General  Level of Consciousness: awake, alert  and oriented  Airway & Oxygen Therapy: Patient Spontanous Breathing and Patient connected to nasal cannula oxygen  Post-op Assessment: Report given to RN, Post -op Vital signs reviewed and stable and Patient moving all extremities X 4  Post vital signs: Reviewed and stable  Last Vitals:  Vitals Value Taken Time  BP 95/53 04/16/2018  2:30 PM  Temp    Pulse 60 04/16/2018  2:30 PM  Resp 14 04/16/2018  2:30 PM  SpO2 99 % 04/16/2018  2:30 PM  Vitals shown include unvalidated device data.  Last Pain:  Vitals:   04/16/18 0550  TempSrc: Oral  PainSc:          Complications: No apparent anesthesia complications

## 2018-04-16 NOTE — Anesthesia Procedure Notes (Signed)
Procedure Name: LMA Insertion Date/Time: 04/16/2018 1:41 PM Performed by: Neldon Newport, CRNA Pre-anesthesia Checklist: Timeout performed, Patient being monitored, Suction available, Emergency Drugs available and Patient identified Patient Re-evaluated:Patient Re-evaluated prior to induction Oxygen Delivery Method: Circle system utilized Preoxygenation: Pre-oxygenation with 100% oxygen Induction Type: IV induction Ventilation: Mask ventilation without difficulty LMA: LMA with gastric port inserted LMA Size: 4.0 Number of attempts: 2 Placement Confirmation: breath sounds checked- equal and bilateral and positive ETCO2 Tube secured with: Tape Dental Injury: Teeth and Oropharynx as per pre-operative assessment

## 2018-04-16 NOTE — Anesthesia Procedure Notes (Signed)
Anesthesia Regional Block: Popliteal block   Pre-Anesthetic Checklist: ,, timeout performed, Correct Patient, Correct Site, Correct Laterality, Correct Procedure, Correct Position, site marked, Risks and benefits discussed,  Surgical consent,  Pre-op evaluation,  At surgeon's request and post-op pain management  Laterality: Right  Prep: chloraprep       Needles:  Injection technique: Single-shot  Needle Type: Echogenic Needle     Needle Length: 9cm  Needle Gauge: 21     Additional Needles:   Narrative:  Start time: 04/16/2018 1:01 PM End time: 04/16/2018 1:05 PM Injection made incrementally with aspirations every 5 mL.  Performed by: Personally  Anesthesiologist: Audry Pili, MD  Additional Notes: No pain on injection. No increased resistance to injection. Injection made in 5cc increments. Good needle visualization. Patient tolerated the procedure well.

## 2018-04-16 NOTE — Anesthesia Procedure Notes (Signed)
Anesthesia Regional Block: Adductor canal block   Pre-Anesthetic Checklist: ,, timeout performed, Correct Patient, Correct Site, Correct Laterality, Correct Procedure, Correct Position, site marked, Risks and benefits discussed,  Surgical consent,  Pre-op evaluation,  At surgeon's request and post-op pain management  Laterality: Right  Prep: chloraprep       Needles:  Injection technique: Single-shot  Needle Type: Echogenic Needle     Needle Length: 9cm  Needle Gauge: 21     Additional Needles:   Narrative:  Start time: 04/16/2018 12:56 PM End time: 04/16/2018 12:59 PM Injection made incrementally with aspirations every 5 mL.  Performed by: Personally  Anesthesiologist: Audry Pili, MD  Additional Notes: No pain on injection. No increased resistance to injection. Injection made in 5cc increments. Good needle visualization. Patient tolerated the procedure well.

## 2018-04-16 NOTE — Anesthesia Preprocedure Evaluation (Addendum)
Anesthesia Evaluation  Patient identified by MRN, date of birth, ID band Patient awake    Reviewed: Allergy & Precautions, NPO status , Patient's Chart, lab work & pertinent test results  History of Anesthesia Complications Negative for: history of anesthetic complications  Airway Mallampati: II  TM Distance: >3 FB Neck ROM: Limited    Dental  (+) Edentulous Upper, Edentulous Lower   Pulmonary former smoker,    breath sounds clear to auscultation       Cardiovascular hypertension, Pt. on medications and Pt. on home beta blockers + Peripheral Vascular Disease   Rhythm:Regular Rate:Normal     Neuro/Psych  RLS  TIAnegative psych ROS   GI/Hepatic Neg liver ROS, GERD  Controlled and Medicated,  Endo/Other  diabetes, Type 2  Renal/GU CRFRenal disease  negative genitourinary   Musculoskeletal negative musculoskeletal ROS (+)   Abdominal   Peds  Hematology  (+) anemia ,   Anesthesia Other Findings   Reproductive/Obstetrics                            Anesthesia Physical Anesthesia Plan  ASA: III  Anesthesia Plan: General   Post-op Pain Management:  Regional for Post-op pain   Induction:   PONV Risk Score and Plan: 3 and Treatment may vary due to age or medical condition, Ondansetron and Propofol infusion  Airway Management Planned: LMA  Additional Equipment: None  Intra-op Plan:   Post-operative Plan: Extubation in OR  Informed Consent: I have reviewed the patients History and Physical, chart, labs and discussed the procedure including the risks, benefits and alternatives for the proposed anesthesia with the patient or authorized representative who has indicated his/her understanding and acceptance.     Plan Discussed with: CRNA and Anesthesiologist  Anesthesia Plan Comments: (Discussion had regarding DNR. Patient states she is agreeable to IV fluids, vasopressor medications,  airway management, and CPR if cause of arrest is deemed reversible. Does not want prolonged mechanical ventilation.)      Anesthesia Quick Evaluation

## 2018-04-16 NOTE — Op Note (Signed)
   Date of Surgery: 04/16/2018  INDICATIONS: Sara Griffith is a 82 y.o.-year-old female who has gangrene and osteomyelitis right foot.  PREOPERATIVE DIAGNOSIS: gangrene and osteomyelitis right foot  POSTOPERATIVE DIAGNOSIS: Same.  PROCEDURE: Transtibial amputation Application of Prevena wound VAC  SURGEON: Sharol Given, M.D.  ANESTHESIA:  general  IV FLUIDS AND URINE: See anesthesia.  ESTIMATED BLOOD LOSS: min mL.  COMPLICATIONS: None.  DESCRIPTION OF PROCEDURE: The patient was brought to the operating room and underwent a general anesthetic. After adequate levels of anesthesia were obtained patient's lower extremity was prepped using DuraPrep draped into a sterile field. A timeout was called. The foot was draped out of the sterile field with impervious stockinette. A transverse incision was made 11 cm distal to the tibial tubercle. This curved proximally and a large posterior flap was created. The tibia was transected 1 cm proximal to the skin incision. The fibula was transected just proximal to the tibial incision. The tibia was beveled anteriorly. A large posterior flap was created. The sciatic nerve was pulled cut and allowed to retract. The vascular bundles were suture ligated with 2-0 silk. The deep and superficial fascial layers were closed using #1 Vicryl. The skin was closed using staples and 2-0 nylon. The wound was covered with a Prevena wound VAC. There was a good suction fit. A prosthetic shrinker was applied. Patient was extubated taken to the PACU in stable condition.   DISCHARGE PLANNING:  Antibiotic duration:24 hours post op  Weightbearing: NWB right  Pain medication: opoid pathway  Dressing care/ Wound VAC, continue for 2 weeks, then may remoove  Discharge to: SNF  Follow-up: In the office 1 week post operative.  [name], MD [place] 2:12 PM

## 2018-04-16 NOTE — Consult Note (Signed)
            [facility] CM Primary Care Navigator  04/16/2018  Sara Griffith Apr 21, 1936 [id number]   Martin Majestic to see patient at the bedside to identify possible discharge needs but she is off the unit in the OR for surgery per staff. (right below the knee amputation- for Osteomyelitis of right ankle and foot/ cellulitis/ nonhealing wound)  Will attempt to see patient at another time when she is available in the room    Addendum (04/19/18):   Went to seepatient at the bedside to identify possible discharge needs. Patient reportshaving a "fall at home, when both legs gave way and was found by friend on the floor" which had resulted to this admission. (cellulitis and skin ulcer at the bottom of the right foot, status post right below the knee amputation) Patient is a resident of [place].  Patient endorsesDr.Lisa Sabra Heck with [facility] her[facility] care provider.   [facility] [address] [facility].   Patientreportsthat her daughter Katharine Look) has been managing her medications at home with use of "pill box" system filled weekly.  Patient states that facility provides transportation toherdoctors' appointments, otherwise, her daughter's mother in-law Vinnie Level) provides transport for her.  Patient mentioned that her daughter will be assisting with her needs at [facility] discharge.   Anticipated discharge plan isskilled nursing facility (SNF) per therapy recommendation.  Patient voiced understanding to call primary care provider's office for a post discharge follow-up appointment within a1- 2 weeksor sooner if needs arise.Patient letter (with PCP's contact number) wasprovided as her reminder.  Explained to [facility] services available for health managementand resourcesat home and she indicated being able to manage her chronic health issues so far with daughter's  assistance.  Patientverbalizedunderstandingof needto seekreferralfrom primary care provider to [facility] care management ifdeemed necessary and appropriatefor services in the nearfuture- onceshereturnsback home.  [facility] care management information was provided for future needsthatshe may have.  Primary care provider's office is listed as providing transition of care (TOC) follow-up.    For additional questions please contact:  Edwena Felty A. Mina Babula, BSN, RN-BC [facility] PRIMARY CARE Navigator Cell: [phone]

## 2018-04-16 NOTE — Interval H&P Note (Signed)
History and Physical Interval Note:  04/16/2018 6:53 AM  Sara Griffith  has presented today for surgery, with the diagnosis of Osteomyelitis and Cellulitis Right Foot  The various methods of treatment have been discussed with the patient and family. After consideration of risks, benefits and other options for treatment, the patient has consented to  Procedure(s): RIGHT BELOW KNEE AMPUTATION (Right) as a surgical intervention .  The patient's history has been reviewed, patient examined, no change in status, stable for surgery.  I have reviewed the patient's chart and labs.  Questions were answered to the patient's satisfaction.     Newt Minion

## 2018-04-17 LAB — GLUCOSE, CAPILLARY
GLUCOSE-CAPILLARY: 169 mg/dL — AB (ref 70–99)
GLUCOSE-CAPILLARY: 167 mg/dL — AB (ref 70–99)
GLUCOSE-CAPILLARY: 228 mg/dL — AB (ref 70–99)
Glucose-Capillary: 124 mg/dL — ABNORMAL HIGH (ref 70–99)
Glucose-Capillary: 144 mg/dL — ABNORMAL HIGH (ref 70–99)
Glucose-Capillary: 223 mg/dL — ABNORMAL HIGH (ref 70–99)
Glucose-Capillary: 260 mg/dL — ABNORMAL HIGH (ref 70–99)

## 2018-04-17 LAB — CBC
HCT: 32.1 % — ABNORMAL LOW (ref 36.0–46.0)
Hemoglobin: 9.4 g/dL — ABNORMAL LOW (ref 12.0–15.0)
MCH: 26.2 pg (ref 26.0–34.0)
MCHC: 29.3 g/dL — AB (ref 30.0–36.0)
MCV: 89.4 fL (ref 78.0–100.0)
Platelets: 434 10*3/uL — ABNORMAL HIGH (ref 150–400)
RBC: 3.59 MIL/uL — AB (ref 3.87–5.11)
RDW: 16.4 % — ABNORMAL HIGH (ref 11.5–15.5)
WBC: 10.8 10*3/uL — ABNORMAL HIGH (ref 4.0–10.5)

## 2018-04-17 LAB — CULTURE, BLOOD (ROUTINE X 2)
CULTURE: NO GROWTH
CULTURE: NO GROWTH
SPECIAL REQUESTS: ADEQUATE
Special Requests: ADEQUATE

## 2018-04-17 LAB — BASIC METABOLIC PANEL
Anion gap: 12 (ref 5–15)
BUN: 23 mg/dL (ref 8–23)
CO2: 25 mmol/L (ref 22–32)
CREATININE: 1.28 mg/dL — AB (ref 0.44–1.00)
Calcium: 8.4 mg/dL — ABNORMAL LOW (ref 8.9–10.3)
Chloride: 100 mmol/L (ref 98–111)
GFR calc non Af Amer: 38 mL/min — ABNORMAL LOW (ref >60)
GFR, EST AFRICAN AMERICAN: 45 mL/min — AB (ref >60)
GLUCOSE: 186 mg/dL — AB (ref 70–99)
Potassium: 4 mmol/L (ref 3.5–5.1)
Sodium: 137 mmol/L (ref 135–145)

## 2018-04-17 MED ORDER — HYDROXYZINE HCL 25 MG PO TABS
25.0000 mg | ORAL_TABLET | Freq: Once | ORAL | Status: AC
  Administered 2018-04-17: 25 mg via ORAL
  Filled 2018-04-17: qty 1

## 2018-04-17 NOTE — Plan of Care (Signed)
  Problem: Skin Integrity: Goal: Risk for impaired skin integrity will decrease Outcome: Progressing   Problem: Pain Managment: Goal: General experience of comfort will improve Outcome: Progressing   

## 2018-04-17 NOTE — NC FL2 (Signed)
[place] MEDICAID FL2 LEVEL OF CARE SCREENING TOOL     IDENTIFICATION  Patient Name: Sara Griffith Birthdate: 12-15-1935 Sex: female Admission Date (Current Location): 04/11/2018  [facility] and [place] Number:  Herbalist and Address:  [facility] [place] [place] [address] [place], [place] [address]      Provider Number: [id number]  Attending Physician Name and Address:  Elgergawy, Silver Huguenin, MD  Relative Name and Phone Number:  Darlyne Russian, daughter, [phone]    Current Level of Care: Hospital Recommended Level of Care: [place] Prior Approval Number:    Date Approved/Denied:   PASRR Number: [id number]  Discharge Plan: SNF    Current Diagnoses: Patient Active Problem List   Diagnosis Date Noted  . Osteomyelitis of right foot ([place])   . Cellulitis 04/12/2018  . Skin ulcer ([place]) 04/12/2018  . Amputated great toe of right foot ([place]) 08/18/2017  . Idiopathic chronic venous hypertension of right lower extremity with ulcer and inflammation ([place]) 08/18/2017  . Type II diabetes mellitus with renal manifestations ([place]) 08/01/2017  . Fall 08/01/2017  . Left groin pain 08/01/2017  . Rhabdomyolysis 08/01/2017  . History of intracranial hemorrhage 08/01/2017  . Acute renal failure superimposed on stage 3 chronic kidney disease ([place]) 08/01/2017  . Fall at home, initial encounter   . Chest pressure 10/21/2016  . Chest pain at rest   . Hypertensive emergency   . Type 2 diabetes mellitus without complication, without long-term current use of insulin ([place])   . RLS (restless legs syndrome) 07/01/2016  . Cervical lymphadenitis 07/01/2016  . GERD (gastroesophageal reflux disease) 07/01/2016  . HLD (hyperlipidemia) 07/01/2016  . HTN (hypertension) 07/01/2016  . Lymphadenitis 07/01/2016    Orientation RESPIRATION BLADDER Height & Weight     Self, Time, Situation, Place  Normal Continent, External catheter Weight: 130 lb 11.7 oz (59.3  kg) Height:  4\' 11"  (149.9 cm)  BEHAVIORAL SYMPTOMS/MOOD NEUROLOGICAL BOWEL NUTRITION STATUS      Continent Diet  AMBULATORY STATUS COMMUNICATION OF NEEDS Skin   Extensive Assist Verbally Surgical wounds, Other (Comment), Wound Vac(surgical incision on right leg with wound vac; diabetic ulcer on left foot)                       Personal Care Assistance Level of Assistance  Feeding, Dressing, Bathing Bathing Assistance: Maximum assistance Feeding assistance: Independent Dressing Assistance: Maximum assistance     Functional Limitations Info  Sight, Hearing, Speech Sight Info: Adequate Hearing Info: Adequate Speech Info: Adequate    SPECIAL CARE FACTORS FREQUENCY  PT (By licensed PT), OT (By licensed OT)     PT Frequency: 5x week OT Frequency: 5x week            Contractures Contractures Info: Not present    Additional Factors Info  Code Status, Allergies, Insulin Sliding Scale Code Status Info: DNR Allergies Info: CYMBALTA DULOXETINE HCL, LIBRAX CHLORDIAZEPOXIDE-CLIDINIUM, LYRICA PREGABALIN, NAPROXEN    Insulin Sliding Scale Info: insulin aspart (novoLOG) injection 0-9 Units every 4 hours       Current Medications (04/17/2018):  This is the current hospital active medication list Current Facility-Administered Medications  Medication Dose Route Frequency Provider Last Rate Last Dose  . 0.9 %  sodium chloride infusion   Intravenous Continuous Newt Minion, MD 10 mL/hr at 04/15/18 2127    . 0.9 %  sodium chloride infusion   Intravenous Continuous Newt Minion, MD   Stopped at 04/17/18 1058  .  acetaminophen (TYLENOL) tablet 325-650 mg  325-650 mg Oral Q6H PRN Newt Minion, MD      . amLODipine (NORVASC) tablet 5 mg  5 mg Oral Daily Newt Minion, MD   5 mg at 04/17/18 1056  . atorvastatin (LIPITOR) tablet 40 mg  40 mg Oral Daily Newt Minion, MD   40 mg at 04/17/18 1056  . bisacodyl (DULCOLAX) suppository 10 mg  10 mg Rectal Daily PRN Newt Minion, MD       . cholecalciferol (VITAMIN D) tablet 1,000 Units  1,000 Units Oral Daily Newt Minion, MD   1,000 Units at 04/17/18 1057  . diphenhydrAMINE (BENADRYL) capsule 25 mg  25 mg Oral Q6H PRN Elgergawy, Silver Huguenin, MD   25 mg at 04/16/18 2008  . docusate sodium (COLACE) capsule 100 mg  100 mg Oral BID Newt Minion, MD   100 mg at 04/17/18 1056  . enoxaparin (LOVENOX) injection 30 mg  30 mg Subcutaneous Daily Newt Minion, MD   30 mg at 04/17/18 1058  . feeding supplement (ENSURE ENLIVE) (ENSURE ENLIVE) liquid 237 mL  237 mL Oral BID BM Newt Minion, MD   237 mL at 04/15/18 1244  . feeding supplement (PRO-STAT SUGAR FREE 64) liquid 30 mL  30 mL Oral BID Newt Minion, MD   30 mL at 04/17/18 1055  . folic acid (FOLVITE) tablet 0.5 mg  0.5 mg Oral Daily Newt Minion, MD   0.5 mg at 04/17/18 1056  . gabapentin (NEURONTIN) capsule 300 mg  300 mg Oral TID Newt Minion, MD   300 mg at 04/17/18 1056  . guaiFENesin (MUCINEX) 12 hr tablet 1,200 mg  1,200 mg Oral BID Newt Minion, MD   1,200 mg at 04/17/18 1055  . HYDROcodone-acetaminophen (NORCO) 7.5-325 MG per tablet 1-2 tablet  1-2 tablet Oral Q4H PRN Newt Minion, MD      . HYDROcodone-acetaminophen (NORCO/VICODIN) 5-325 MG per tablet 1-2 tablet  1-2 tablet Oral Q4H PRN Newt Minion, MD      . insulin aspart (novoLOG) injection 0-9 Units  0-9 Units Subcutaneous Q4H Newt Minion, MD   2 Units at 04/17/18 1107  . lactated ringers infusion   Intravenous Continuous Newt Minion, MD      . lactated ringers infusion   Intravenous Continuous Newt Minion, MD   Stopped at 04/16/18 1600  . losartan (COZAAR) tablet 50 mg  50 mg Oral Daily Newt Minion, MD   50 mg at 04/17/18 1056  . magnesium citrate solution 1 Bottle  1 Bottle Oral Once PRN Newt Minion, MD      . methocarbamol (ROBAXIN) tablet 500 mg  500 mg Oral Q6H PRN Newt Minion, MD       Or  . methocarbamol (ROBAXIN) 500 mg in dextrose 5 % 50 mL IVPB  500 mg Intravenous Q6H PRN Newt Minion, MD      . metoCLOPramide (REGLAN) tablet 5-10 mg  5-10 mg Oral Q8H PRN Newt Minion, MD       Or  . metoCLOPramide (REGLAN) injection 5-10 mg  5-10 mg Intravenous Q8H PRN Newt Minion, MD      . metoprolol tartrate (LOPRESSOR) tablet 50 mg  50 mg Oral BID WC Newt Minion, MD   50 mg at 04/17/18 1056  . morphine 2 MG/ML injection 0.5-1 mg  0.5-1 mg Intravenous Q2H PRN Newt Minion,  MD      . ondansetron (ZOFRAN) tablet 4 mg  4 mg Oral Q6H PRN Newt Minion, MD       Or  . ondansetron [facility]) injection 4 mg  4 mg Intravenous Q6H PRN Newt Minion, MD      . pantoprazole (PROTONIX) EC tablet 40 mg  40 mg Oral Daily Newt Minion, MD   40 mg at 04/17/18 1056  . piperacillin-tazobactam (ZOSYN) IVPB 3.375 g  3.375 g Intravenous Q8H Newt Minion, MD 12.5 mL/hr at 04/17/18 0614 3.375 g at 04/17/18 0614  . polyethylene glycol (MIRALAX / GLYCOLAX) packet 17 g  17 g Oral Daily PRN Newt Minion, MD      . pramipexole (MIRAPEX) tablet 0.5 mg  0.5 mg Oral QHS Newt Minion, MD   0.5 mg at 04/16/18 2150  . psyllium (HYDROCIL/METAMUCIL) packet 1 packet  1 packet Oral Daily PRN Newt Minion, MD      . traMADol Veatrice Bourbon) tablet 50-100 mg  50-100 mg Oral BID PRN Newt Minion, MD      . vancomycin (VANCOCIN) 500 mg in sodium chloride 0.9 % 100 mL IVPB  500 mg Intravenous Q24H Newt Minion, MD 100 mL/hr at 04/17/18 0609 500 mg at 04/17/18 0609     Discharge Medications: Please see discharge summary for a list of discharge medications.  Relevant Imaging Results:  Relevant Lab Results:   Additional Information [id number] [place], [place]

## 2018-04-17 NOTE — Evaluation (Signed)
Physical Therapy Evaluation Patient Details Name: Sara Griffith MRN: [id number] DOB: Sep 08, 1935 Today's Date: 04/17/2018   History of Present Illness  Pt is an 82 y.o. female with PMH of hypertension, dm2, ckd stage 3, severe protein calorie malnutrition, intracranial hemorrhage, pvd, chronic osteomyelitis of the right foot, and s/p bilat great toe amputations. She presented to the ED after a fall where her legs gave out and she hit her lip and head.  Pt was scheduled to undergo R BKA in 2 weeks due to cellulitis and osteomyelitis R foot. Surgery was moved up due to admission and R BKA performed 04-16-18.     Clinical Impression  Pt admitted with above diagnosis. Pt currently with functional limitations due to the deficits listed below (see PT Problem List). On eval, pt required min assist bed mobility and mod assist attempts at sit to stand. Pt tolerated sitting EOB x 10 minutes with min guard assist. Pt with c/o feeling dizzy/lightheaded requiring return to supine. She is very motivated to participate in therapy. Pt will benefit from skilled PT to increase their independence and safety with mobility to allow discharge to the venue listed below.       Follow Up Recommendations SNF    Equipment Recommendations  Other (comment)(defer to next venue)    Recommendations for Other Services       Precautions / Restrictions Precautions Precautions: Fall;Other (comment) Precaution Comments: wound vac Restrictions Weight Bearing Restrictions: Yes RLE Weight Bearing: Non weight bearing Other Position/Activity Restrictions: keep R residual limb elevated in bed      Mobility  Bed Mobility Overal bed mobility: Needs Assistance Bed Mobility: Rolling Rolling: Min assist   Supine to sit: Min assist     General bed mobility comments: +rail, increased time and effort, cues for sequencing, use of bed pad to scoot to EOB  Transfers Overall transfer level: Needs assistance   Transfers: Sit  to/from Stand Sit to Stand: Mod assist         General transfer comment: Pt able to clear bottom from bed but unable to attain full upright stance. She reports feeling dizzy/lightheaded and nervous about falling. When ready to progress to recliner, may benefit from stedy.   Ambulation/Gait             General Gait Details: unable   Stairs            Wheelchair Mobility    Modified Rankin (Stroke Patients Only)       Balance Overall balance assessment: Needs assistance Sitting-balance support: Single extremity supported;Feet unsupported Sitting balance-Leahy Scale: Fair Sitting balance - Comments: Pt sat EOB x 10 minutes with min guard assist.                                     Pertinent Vitals/Pain Pain Assessment: No/denies pain    Home Living Family/patient expects to be discharged to:: Skilled nursing facility                 Additional Comments: Pt lives at [facility].    Prior Function Level of Independence: Needs assistance   Gait / Transfers Assistance Needed: uses a rollator for all mobility           Hand Dominance   Dominant Hand: Right    Extremity/Trunk Assessment   Upper Extremity Assessment Upper Extremity Assessment: Generalized weakness    Lower Extremity Assessment Lower Extremity Assessment:  Generalized weakness    Cervical / Trunk Assessment Cervical / Trunk Assessment: Kyphotic  Communication   Communication: No difficulties  Cognition Arousal/Alertness: Awake/alert Behavior During Therapy: WFL for tasks assessed/performed Overall Cognitive Status: Within Functional Limits for tasks assessed                                        General Comments      Exercises Amputee Exercises Knee Flexion: AROM;5 reps;Seated Knee Extension: AROM;5 reps;Seated   Assessment/Plan    PT Assessment Patient needs continued PT services  PT Problem List Decreased  strength;Decreased mobility;Decreased activity tolerance;Decreased balance       PT Treatment Interventions Therapeutic activities;DME instruction;Gait training;Therapeutic exercise;Patient/family education;Balance training;Functional mobility training;Neuromuscular re-education    PT Goals (Current goals can be found in the Care Plan section)  Acute Rehab PT Goals Patient Stated Goal: get better PT Goal Formulation: With patient Time For Goal Achievement: 05/01/18 Potential to Achieve Goals: Good    Frequency Min 3X/week   Barriers to discharge        Co-evaluation               AM-PAC PT "6 Clicks" Daily Activity  Outcome Measure Difficulty turning over in bed (including adjusting bedclothes, sheets and blankets)?: A Lot Difficulty moving from lying on back to sitting on the side of the bed? : Unable Difficulty sitting down on and standing up from a chair with arms (e.g., wheelchair, bedside commode, etc,.)?: Unable Help needed moving to and from a bed to chair (including a wheelchair)?: Total Help needed walking in hospital room?: Total Help needed climbing 3-5 steps with a railing? : Total 6 Click Score: 7    End of Session Equipment Utilized During Treatment: Gait belt;Oxygen Activity Tolerance: Patient tolerated treatment well Patient left: in bed;with call bell/phone within reach;with bed alarm set Nurse Communication: Mobility status;Other (comment)(Pt reports not getting a breakfast tray. ) PT Visit Diagnosis: Other abnormalities of gait and mobility (R26.89);Muscle weakness (generalized) (M62.81)    Time: 2841-3244 PT Time Calculation (min) (ACUTE ONLY): 28 min   Charges:   PT Evaluation $PT Eval Moderate Complexity: 1 Mod PT Treatments $Therapeutic Activity: 8-22 mins        Lorrin Goodell, PT  Office # [phone] Pager [phone]   [name] 04/17/2018, 10:44 AM

## 2018-04-17 NOTE — Progress Notes (Signed)
Orthopedic Tech Progress Note Patient Details:  Sara Griffith May 29, 1936 [id number]  Patient ID: [name], female   DOB: [birth year], 82 y.o.   MRN: [id number]   Hildred Priest Called in bio-tech brace order; spoke with Mortimer Fries

## 2018-04-17 NOTE — Progress Notes (Signed)
PROGRESS NOTE                                                                                                                                                                                                             Patient Demographics:    Sara Griffith, is a 82 y.o. female, DOB - 26-Aug-1936, FIE:[id number]  Admit date - 04/11/2018   Admitting Physician Jani Gravel, MD  Outpatient Primary MD for the patient is Kathyrn Lass, MD  LOS - 5   Chief Complaint  Patient presents with  . Fall       Brief Narrative    Sara Griffith a89 y.o.female,w hypertension, dm2, ckd stage 3, severe protein calorie malnutrition,intracranial hemorrhage,pvd, chronic osteomyelitis of the right foot, presented with a fall where her legs gave out and she hit her lip and head. Pt denies syncope, cp, palp, sob, n/v, abd pain, diarrhea, brbpr.  In ED pt was noted to have increase in redness of the bilateral distal lower ext .  She is being followed by Dr. Sharol Given for known subacute osteomyelitis of the right foot, she had a plan for transtibial amputation 2 weeks as an outpatient.   Subjective:    Sara Griffith today has, No headache, No chest pain, No abdominal pain , denies any complaints today  Assessment  & Plan :    Principal Problem:   Cellulitis Active Problems:   HTN (hypertension)   Type 2 diabetes mellitus without complication, without long-term current use of insulin (HCC)   Skin ulcer (HCC)   Osteomyelitis of right foot (HCC)   Subacute Osteomyelitis of right ankle and foot/cellulitis/nonhealing wound -This appears to be chronic problem, leukocytosis on admission 15.3, has been improved on broad-spectrum antibiotic, was empirically on vancomycin and Zosyn, will DC antibiotics today, as it is 24 hours after her amputation. -Management per orthopedic team, patient is well-known to Dr. Sharol Given from orthopedic service, by reviewing outpatient orthopedic records,  she does appear to be having subacute osteomyelitis with ulceration and cellulitis, plan was for transtibial amputation in 2 weeks as an outpatient, she went for transtibial amputation 04/16/2018 by Dr. Sharol Given.  Sinus tachycardia -Reolved,   Diabetes mellitus type 2 -Her CBG are currently acceptable insulin sliding scale -  continue to hold Tradjenta,   Hypertension  -Acceptable, continue with home regimen of amlodipine, losartan and metoprolol  Hyperlipidemia - Cont Lipitor 40mg  po qhs  Prior h/o intracranial hemorrhage - not taking Keppra anymore, ICH was > 6 mos ago  RLS  - Cont Mirapex 0.5mg  po qhs  Severe protein calorie malnutrition -Continue with pro-stat and ensure    Code Status : DNR  Family Communication  : Cussed with daughter via phone 04/17/2018  Disposition Plan  : will need SNF placement, hopefully by Monday or Tuesday  Consults  :  Ortho, dr Sharol Given  Procedures  : None  DVT Prophylaxis  :  [place] lovenox  Lab Results  Component Value Date   PLT 434 (H) 04/17/2018    Antibiotics  :    Anti-infectives (From admission, onward)   Start     Dose/Rate Route Frequency Ordered Stop   04/16/18 1158  ceFAZolin (ANCEF) 2-4 GM/100ML-% IVPB    Note to Pharmacy:  Merryl Hacker   : cabinet override      04/16/18 1158 04/16/18 1327   04/16/18 0745  ceFAZolin (ANCEF) IVPB 2g/100 mL premix     2 g 200 mL/hr over 30 Minutes Intravenous On call to O.R. 04/16/18 0738 04/16/18 1327   04/13/18 0600  vancomycin (VANCOCIN) 500 mg in sodium chloride 0.9 % 100 mL IVPB  Status:  Discontinued     500 mg 100 mL/hr over 60 Minutes Intravenous Every 24 hours 04/12/18 2036 04/17/18 1443   04/12/18 2100  piperacillin-tazobactam (ZOSYN) IVPB 3.375 g  Status:  Discontinued     3.375 g 12.5 mL/hr over 240 Minutes Intravenous Every 8 hours 04/12/18 2034 04/17/18 1443   04/12/18 0345  vancomycin (VANCOCIN) IVPB 1000 mg/200 mL premix     1,000 mg 200 mL/hr over 60 Minutes  Intravenous  Once 04/12/18 0332 04/12/18 0453   04/12/18 0345  piperacillin-tazobactam (ZOSYN) IVPB 3.375 g     3.375 g 100 mL/hr over 30 Minutes Intravenous  Once 04/12/18 0332 04/12/18 0424        Objective:   Vitals:   04/16/18 1600 04/16/18 1617 04/16/18 2041 04/17/18 0428  BP:  137/64 (!) 116/58 (!) 145/67  Pulse: 62 63 68 76  Resp: 17 16 18 18   Temp: 97.6 F (36.4 C)  97.6 F (36.4 C) 97.7 F (36.5 C)  TempSrc:   Oral   SpO2: 99% 100% 99% 100%  Weight:      Height:        Wt Readings from Last 3 Encounters:  04/12/18 59.3 kg  03/23/18 61.2 kg  09/24/17 61.2 kg     Intake/Output Summary (Last 24 hours) at 04/17/2018 1443 Last data filed at 04/17/2018 1300 Gross per 24 hour  Intake 380 ml  Output 1000 ml  Net -620 ml     Physical Exam  Frail elderly female, Oriented X 3, No new F.N deficits, Normal affect Symmetrical Chest wall movement, Good air movement bilaterally, CTAB RRR,No Gallops,Rubs or new Murmurs, No Parasternal Heave +ve B.Sounds, Abd Soft, No tenderness, No rebound - guarding or rigidity. Status post right BKA, wound VAC   Data Review:    CBC Recent Labs  Lab 04/11/18 2125 04/12/18 0948 04/13/18 0347 04/14/18 0454 04/15/18 0441 04/17/18 0424  WBC 15.3* 12.4* 11.5* 12.4* 10.8* 10.8*  HGB 9.3* 8.8* 8.7* 9.6* 8.8* 9.4*  HCT 30.3* 28.5* 28.6* 31.4* 28.9* 32.1*  PLT 444* 363 386 413* 414* 434*  MCV 86.6 85.3 86.1 85.8 86.5 89.4  MCH 26.6 26.3 26.2 26.2 26.3 26.2  MCHC 30.7 30.9 30.4 30.6 30.4 29.3*  RDW  16.2* 16.2* 16.5* 16.6* 16.5* 16.4*  LYMPHSABS 1.6  --   --   --   --   --   MONOABS 0.7  --   --   --   --   --   EOSABS 0.3  --   --   --   --   --   BASOSABS 0.0  --   --   --   --   --     Chemistries  Recent Labs  Lab 04/11/18 2125 04/12/18 0948 04/14/18 0454 04/15/18 0441 04/17/18 0424  NA 137 139 139 138 137  K 3.8 3.3* 4.5 4.0 4.0  CL 99 106 106 103 100  CO2 25 24 26 26 25   GLUCOSE 205* 107* 190* 189* 186*  BUN 15  11 11 17 23   CREATININE 1.11* 1.03* 1.08* 1.11* 1.28*  CALCIUM 9.1 8.0* 8.1* 8.1* 8.4*  AST 28 26  --   --   --   ALT 20 18  --   --   --   ALKPHOS 102 77  --   --   --   BILITOT 0.6 0.6  --   --   --    ------------------------------------------------------------------------------------------------------------------ No results for input(s): CHOL, HDL, LDLCALC, TRIG, CHOLHDL, LDLDIRECT in the last 72 hours.  Lab Results  Component Value Date   HGBA1C 7.4 (H) 08/02/2017   ------------------------------------------------------------------------------------------------------------------ No results for input(s): TSH, T4TOTAL, T3FREE, THYROIDAB in the last 72 hours.  Invalid input(s): FREET3 ------------------------------------------------------------------------------------------------------------------ No results for input(s): VITAMINB12, FOLATE, FERRITIN, TIBC, IRON, RETICCTPCT in the last 72 hours.  Coagulation profile Recent Labs  Lab 04/12/18 0638  INR 1.09    No results for input(s): DDIMER in the last 72 hours.  Cardiac Enzymes Recent Labs  Lab 04/12/18 0510 04/12/18 0948  TROPONINI <0.03 0.03*   ------------------------------------------------------------------------------------------------------------------    Component Value Date/Time   BNP 295.2 (H) 10/21/2016 1437    Inpatient Medications  Scheduled Meds: . amLODipine  5 mg Oral Daily  . atorvastatin  40 mg Oral Daily  . cholecalciferol  1,000 Units Oral Daily  . docusate sodium  100 mg Oral BID  . enoxaparin (LOVENOX) injection  30 mg Subcutaneous Daily  . feeding supplement (ENSURE ENLIVE)  237 mL Oral BID BM  . feeding supplement (PRO-STAT SUGAR FREE 64)  30 mL Oral BID  . folic acid  0.5 mg Oral Daily  . gabapentin  300 mg Oral TID  . guaiFENesin  1,200 mg Oral BID  . insulin aspart  0-9 Units Subcutaneous Q4H  . losartan  50 mg Oral Daily  . metoprolol tartrate  50 mg Oral BID WC  .  pantoprazole  40 mg Oral Daily  . pramipexole  0.5 mg Oral QHS   Continuous Infusions: . sodium chloride 10 mL/hr at 04/15/18 2127  . sodium chloride Stopped (04/17/18 1058)  . methocarbamol (ROBAXIN) IV     PRN Meds:.acetaminophen, bisacodyl, diphenhydrAMINE, HYDROcodone-acetaminophen, HYDROcodone-acetaminophen, magnesium citrate, methocarbamol **OR** methocarbamol (ROBAXIN) IV, metoCLOPramide **OR** metoCLOPramide (REGLAN) injection, morphine injection, ondansetron **OR** ondansetron (ZOFRAN) IV, polyethylene glycol, psyllium, traMADol  Micro Results Recent Results (from the past 240 hour(s))  Culture, blood (routine x 2)     Status: None   Collection Time: 04/12/18  5:10 AM  Result Value Ref Range Status   Specimen Description BLOOD RIGHT ARM  Final   Special Requests   Final    BOTTLES DRAWN AEROBIC AND ANAEROBIC Blood Culture adequate volume   Culture   Final  NO GROWTH 5 DAYS Performed at [facility], [place] [address], [place], [place] [address]    Report Status 04/17/2018 FINAL  Final  Surgical pcr screen     Status: Abnormal   Collection Time: 04/12/18  5:19 AM  Result Value Ref Range Status   MRSA, PCR NEGATIVE NEGATIVE Final   Staphylococcus aureus POSITIVE (A) NEGATIVE Final    Comment: (NOTE) The Xpert SA Assay (FDA approved for NASAL specimens in patients 68 years of age and older), is one component of a comprehensive surveillance program. It is not intended to diagnose infection nor to guide or monitor treatment. Performed at [facility], [place] [address], [place], [place] [address]   Culture, blood (routine x 2)     Status: None   Collection Time: 04/12/18  5:25 AM  Result Value Ref Range Status   Specimen Description BLOOD RIGHT ANTECUBITAL  Final   Special Requests   Final    BOTTLES DRAWN AEROBIC AND ANAEROBIC Blood Culture adequate volume   Culture   Final    NO GROWTH 5 DAYS Performed at [facility], [place] [address], [place],   [address]    Report Status 04/17/2018 FINAL  Final    Radiology Reports Dg Chest 2 View  Result Date: 04/11/2018 CLINICAL DATA:  Un witnessed fall today EXAM: CHEST - 2 VIEW COMPARISON:  08/09/2017 FINDINGS: Cardiac shadows within normal limits. No focal infiltrate or sizable effusion is seen. The lungs are well aerated with mild interstitial changes stable from the prior study. No acute bony abnormality is seen. Chronic compression deformity in the lower thoracic/upper lumbar spine is seen. IMPRESSION: Chronic changes without acute abnormality. Electronically Signed   By: Inez Catalina M.D.   On: 04/11/2018 20:54   Dg Pelvis 1-2 Views  Result Date: 04/11/2018 CLINICAL DATA:  Recent fall with pelvic pain, initial encounter EXAM: PELVIS - 1-2 VIEW COMPARISON:  08/01/2017 FINDINGS: Pelvic ring is intact. No acute fracture or dislocation is seen. Mild degenerative changes of the hip joints are noted bilaterally. IMPRESSION: No acute abnormality noted. Electronically Signed   By: Inez Catalina M.D.   On: 04/11/2018 20:55   Ct Head Wo Contrast  Result Date: 04/11/2018 CLINICAL DATA:  Head trauma. Fall this morning. EXAM: CT HEAD WITHOUT CONTRAST CT CERVICAL SPINE WITHOUT CONTRAST TECHNIQUE: Multidetector CT imaging of the head and cervical spine was performed following the standard protocol without intravenous contrast. Multiplanar CT image reconstructions of the cervical spine were also generated. COMPARISON:  08/02/2017 FINDINGS: CT HEAD FINDINGS Brain: No evidence of acute infarction, hemorrhage, hydrocephalus, extra-axial collection or mass lesion/mass effect. There is mild diffuse low-attenuation within the subcortical and periventricular white matter compatible with chronic microvascular disease. Prominence of the sulci and ventricles compatible with brain atrophy. Vascular: No hyperdense vessel or unexpected calcification. Skull: Normal. Negative for fracture or focal lesion. Sinuses/Orbits: No acute  finding. Other: None. CT CERVICAL SPINE FINDINGS Alignment: There is reversal of normal cervical lordosis. There is an anterolisthesis of C2 on C3 measuring 4 mm. Skull base and vertebrae: No acute fracture. No primary bone lesion or focal pathologic process. Soft tissues and spinal canal: No prevertebral fluid or swelling. No visible canal hematoma. Disc levels: Previous posterior laminectomy and decompression of C3 through C7. Solid fusion of C5 through C7 noted. Marked degenerative disc disease noted at C3-4, C4-5 and C7-T1. Upper chest: Calcified granuloma noted in the medial right upper lobe. Other: None IMPRESSION: 1. No acute intracranial  abnormalities. Chronic small vessel ischemic disease and brain atrophy. 2. No evidence of acute cervical spine fracture. 3. Multilevel degenerative disc disease and cervical spondylosis, as above. 4. Extensive cervical spondylosis is identified status post posterior decompression of C3 through C7. There is an anterolisthesis of C2 on C3 which is felt to likely reflect changes secondary to chronic spondylosis. Electronically Signed   By: Kerby Moors M.D.   On: 04/11/2018 21:23   Ct Cervical Spine Wo Contrast  Result Date: 04/11/2018 CLINICAL DATA:  Head trauma. Fall this morning. EXAM: CT HEAD WITHOUT CONTRAST CT CERVICAL SPINE WITHOUT CONTRAST TECHNIQUE: Multidetector CT imaging of the head and cervical spine was performed following the standard protocol without intravenous contrast. Multiplanar CT image reconstructions of the cervical spine were also generated. COMPARISON:  08/02/2017 FINDINGS: CT HEAD FINDINGS Brain: No evidence of acute infarction, hemorrhage, hydrocephalus, extra-axial collection or mass lesion/mass effect. There is mild diffuse low-attenuation within the subcortical and periventricular white matter compatible with chronic microvascular disease. Prominence of the sulci and ventricles compatible with brain atrophy. Vascular: No hyperdense vessel or  unexpected calcification. Skull: Normal. Negative for fracture or focal lesion. Sinuses/Orbits: No acute finding. Other: None. CT CERVICAL SPINE FINDINGS Alignment: There is reversal of normal cervical lordosis. There is an anterolisthesis of C2 on C3 measuring 4 mm. Skull base and vertebrae: No acute fracture. No primary bone lesion or focal pathologic process. Soft tissues and spinal canal: No prevertebral fluid or swelling. No visible canal hematoma. Disc levels: Previous posterior laminectomy and decompression of C3 through C7. Solid fusion of C5 through C7 noted. Marked degenerative disc disease noted at C3-4, C4-5 and C7-T1. Upper chest: Calcified granuloma noted in the medial right upper lobe. Other: None IMPRESSION: 1. No acute intracranial abnormalities. Chronic small vessel ischemic disease and brain atrophy. 2. No evidence of acute cervical spine fracture. 3. Multilevel degenerative disc disease and cervical spondylosis, as above. 4. Extensive cervical spondylosis is identified status post posterior decompression of C3 through C7. There is an anterolisthesis of C2 on C3 which is felt to likely reflect changes secondary to chronic spondylosis. Electronically Signed   By: Kerby Moors M.D.   On: 04/11/2018 21:23     Phillips Climes M.D on 04/17/2018 at 2:43 PM  Between 7am to 7pm - Pager - [phone]  After 7pm go to [email or web address] - password [facility]  [facility] -  Office  [phone]

## 2018-04-18 LAB — GLUCOSE, CAPILLARY
GLUCOSE-CAPILLARY: 160 mg/dL — AB (ref 70–99)
GLUCOSE-CAPILLARY: 185 mg/dL — AB (ref 70–99)
GLUCOSE-CAPILLARY: 275 mg/dL — AB (ref 70–99)
Glucose-Capillary: 121 mg/dL — ABNORMAL HIGH (ref 70–99)
Glucose-Capillary: 171 mg/dL — ABNORMAL HIGH (ref 70–99)
Glucose-Capillary: 239 mg/dL — ABNORMAL HIGH (ref 70–99)

## 2018-04-18 MED ORDER — GLUCERNA SHAKE PO LIQD
237.0000 mL | Freq: Three times a day (TID) | ORAL | Status: DC
  Administered 2018-04-18 – 2018-04-19 (×4): 237 mL via ORAL

## 2018-04-18 NOTE — Clinical Social Work Note (Signed)
Clinical Social Work Assessment  Patient Details  Name: Sara Griffith MRN: [id number] Date of Birth: [birth year]  Date of referral:  04/18/18               Reason for consult:  Facility Placement, Discharge Planning                Permission sought to share information with:  Facility Sport and exercise psychologist, Family Supports Permission granted to share information::  Yes, Verbal Permission Granted  Name::     Darlyne Russian  Agency::  SNFs  Relationship::  daughter  Contact Information:  [phone]  Housing/Transportation Living arrangements for the past 2 months:  [place] of Information:  Patient, Adult Children Patient Interpreter Needed:  None Criminal Activity/Legal Involvement Pertinent to Current Situation/Hospitalization:  No - Comment as needed Significant Relationships:  Adult Children Lives with:  Self Do you feel safe going back to the place where you live?  Yes Need for family participation in patient care:  Yes (Comment)  Care giving concerns: Patient from [place]. PT recommending SNF. Status post transtibial amputation this admission. Has a wound vac.   Social Worker assessment / plan: CSW met with patient and daughter, Katharine Look, at bedside. Patient alert and oriented. CSW introduced self and role and discussed disposition planning. Patient and daughter agreeable to SNF and prefer [facility]. Daughter also hopeful patient can transfer to a long term care facility out of [facility], closer to other family, after some rehab at [facility].   Daughter also indicated patient has a VA benefit, and needs a form completed by the SNF when one is identified.   CSW explained SNF referral and [facility] authorization process. CSW to send out initial referrals and will provide bed offers when available. CSW to follow and support.  Employment status:  Retired Forensic scientist:  Managed Medicare([employer]) PT Recommendations:  [place] / Referral to community resources:  [place]  Patient/Family's Response to care: Patient and daughter appreciative of care.  Patient/Family's Understanding of and Emotional Response to Diagnosis, Current Treatment, and Prognosis: Patient and daughter with understanding of patient's condition and agreeable to SNF.  Emotional Assessment Appearance:  Appears stated age Attitude/Demeanor/Rapport:  Engaged Affect (typically observed):  Accepting, Calm, Appropriate, Pleasant Orientation:  Oriented to Self, Oriented to Place, Oriented to  Time, Oriented to Situation Alcohol / Substance use:  Not Applicable Psych involvement (Current and /or in the community):  No (Comment)  Discharge Needs  Concerns to be addressed:  Discharge Planning Concerns, Care Coordination Readmission within the last 30 days:  No Current discharge risk:  Physical Impairment, Dependent with Mobility Barriers to Discharge:  Continued Medical Work up, [place], [place] 04/18/2018, 11:17 AM

## 2018-04-18 NOTE — Progress Notes (Signed)
PROGRESS NOTE                                                                                                                                                                                                             Patient Demographics:    Sara Griffith, is a 82 y.o. female, DOB - 02/07/1936, PFX:[id number]  Admit date - 04/11/2018   Admitting Physician Jani Gravel, MD  Outpatient Primary MD for the patient is Kathyrn Lass, MD  LOS - 6   Chief Complaint  Patient presents with  . Fall       Brief Narrative    [name] a80 y.o.female,w hypertension, dm2, ckd stage 3, severe protein calorie malnutrition,intracranial hemorrhage,pvd, chronic osteomyelitis of the right foot, presented with a fall where her legs gave out and she hit her lip and head. Pt denies syncope, cp, palp, sob, n/v, abd pain, diarrhea, brbpr.  In ED pt was noted to have increase in redness of the bilateral distal lower ext .  She is being followed by Dr. Sharol Given for known subacute osteomyelitis of the right foot, she had a plan for transtibial amputation 2 weeks as an outpatient.  She was seen by orthopedic, she went for transtibial amputation 04/16/2018 by Dr. Sharol Given   Subjective:    Sara Griffith today has, No headache, No chest pain, No abdominal pain , does report some upper extremity twitching.  Assessment  & Plan :    Principal Problem:   Cellulitis Active Problems:   HTN (hypertension)   Type 2 diabetes mellitus without complication, without long-term current use of insulin (HCC)   Skin ulcer (HCC)   Osteomyelitis of right foot (HCC)   Subacute Osteomyelitis of right ankle and foot/cellulitis/nonhealing wound -This appears to be chronic problem, leukocytosis on admission 15.3, has been improved on broad-spectrum antibiotic, was empirically on vancomycin and Zosyn, off antibiotics 04/17/2018 -Management per orthopedic team, patient is well-known to Dr. Sharol Given from  orthopedic service, by reviewing outpatient orthopedic records, she does appear to be having subacute osteomyelitis with ulceration and cellulitis, plan was for transtibial amputation in 2 weeks as an outpatient, she went for transtibial amputation 04/16/2018 by Dr. Sharol Given.  Sinus tachycardia -Reolved,   Diabetes mellitus type 2 -Her CBG are currently acceptable insulin sliding scale -  continue to hold Tradjenta,   Hypertension  -Acceptable, continue  with home regimen of amlodipine, losartan and metoprolol   Hyperlipidemia - Cont Lipitor 40mg  po qhs  Prior h/o intracranial hemorrhage - not taking Keppra anymore, ICH was > 6 mos ago  RLS  - Cont Mirapex 0.5mg  po qhs  Severe protein calorie malnutrition -Continue with pro-stat and ensure    Code Status : DNR  Family Communication  : Discussed with daughter via phone 04/17/2018  Disposition Plan  : will need SNF placement, hopefully by Monday or Tuesday  Consults  :  Ortho, dr Sharol Given  Procedures  : None  DVT Prophylaxis  :  [place] lovenox  Lab Results  Component Value Date   PLT 434 (H) 04/17/2018    Antibiotics  :    Anti-infectives (From admission, onward)   Start     Dose/Rate Route Frequency Ordered Stop   04/16/18 1158  ceFAZolin (ANCEF) 2-4 GM/100ML-% IVPB    Note to Pharmacy:  Merryl Hacker   : cabinet override      04/16/18 1158 04/16/18 1327   04/16/18 0745  ceFAZolin (ANCEF) IVPB 2g/100 mL premix     2 g 200 mL/hr over 30 Minutes Intravenous On call to O.R. 04/16/18 0738 04/16/18 1327   04/13/18 0600  vancomycin (VANCOCIN) 500 mg in sodium chloride 0.9 % 100 mL IVPB  Status:  Discontinued     500 mg 100 mL/hr over 60 Minutes Intravenous Every 24 hours 04/12/18 2036 04/17/18 1443   04/12/18 2100  piperacillin-tazobactam (ZOSYN) IVPB 3.375 g  Status:  Discontinued     3.375 g 12.5 mL/hr over 240 Minutes Intravenous Every 8 hours 04/12/18 2034 04/17/18 1443   04/12/18 0345  vancomycin (VANCOCIN) IVPB  1000 mg/200 mL premix     1,000 mg 200 mL/hr over 60 Minutes Intravenous  Once 04/12/18 0332 04/12/18 0453   04/12/18 0345  piperacillin-tazobactam (ZOSYN) IVPB 3.375 g     3.375 g 100 mL/hr over 30 Minutes Intravenous  Once 04/12/18 0332 04/12/18 0424        Objective:   Vitals:   04/17/18 0428 04/17/18 2122 04/18/18 0640 04/18/18 1426  BP: (!) 145/67 111/63 119/80 (!) 120/55  Pulse: 76 75 68 72  Resp: 18 20 18 16   Temp: 97.7 F (36.5 C) 98 F (36.7 C) 98.3 F (36.8 C) 98.2 F (36.8 C)  TempSrc:  Oral Oral Oral  SpO2: 100% 95% 99% 99%  Weight:      Height:        Wt Readings from Last 3 Encounters:  04/12/18 59.3 kg  03/23/18 61.2 kg  09/24/17 61.2 kg     Intake/Output Summary (Last 24 hours) at 04/18/2018 1449 Last data filed at 04/18/2018 1046 Gross per 24 hour  Intake -  Output 1225 ml  Net -1225 ml     Physical Exam  Awake Alert, Oriented X 3, No new F.N deficits, Normal affect Symmetrical Chest wall movement, Good air movement bilaterally, CTAB RRR,No Gallops,Rubs or new Murmurs, No Parasternal Heave +ve B.Sounds, Abd Soft, No tenderness, No rebound - guarding or rigidity. No Cyanosis, Clubbing or edema, No new Rash or bruise   Status post right BKA, wound VAC, with biotic brace   Data Review:    CBC Recent Labs  Lab 04/11/18 2125 04/12/18 0948 04/13/18 0347 04/14/18 0454 04/15/18 0441 04/17/18 0424  WBC 15.3* 12.4* 11.5* 12.4* 10.8* 10.8*  HGB 9.3* 8.8* 8.7* 9.6* 8.8* 9.4*  HCT 30.3* 28.5* 28.6* 31.4* 28.9* 32.1*  PLT 444* 363 386 413* 414* 434*  MCV 86.6 85.3 86.1 85.8 86.5 89.4  MCH 26.6 26.3 26.2 26.2 26.3 26.2  MCHC 30.7 30.9 30.4 30.6 30.4 29.3*  RDW 16.2* 16.2* 16.5* 16.6* 16.5* 16.4*  LYMPHSABS 1.6  --   --   --   --   --   MONOABS 0.7  --   --   --   --   --   EOSABS 0.3  --   --   --   --   --   BASOSABS 0.0  --   --   --   --   --     Chemistries  Recent Labs  Lab 04/11/18 2125 04/12/18 0948 04/14/18 0454 04/15/18 0441  04/17/18 0424  NA 137 139 139 138 137  K 3.8 3.3* 4.5 4.0 4.0  CL 99 106 106 103 100  CO2 25 24 26 26 25   GLUCOSE 205* 107* 190* 189* 186*  BUN 15 11 11 17 23   CREATININE 1.11* 1.03* 1.08* 1.11* 1.28*  CALCIUM 9.1 8.0* 8.1* 8.1* 8.4*  AST 28 26  --   --   --   ALT 20 18  --   --   --   ALKPHOS 102 77  --   --   --   BILITOT 0.6 0.6  --   --   --    ------------------------------------------------------------------------------------------------------------------ No results for input(s): CHOL, HDL, LDLCALC, TRIG, CHOLHDL, LDLDIRECT in the last 72 hours.  Lab Results  Component Value Date   HGBA1C 7.4 (H) 08/02/2017   ------------------------------------------------------------------------------------------------------------------ No results for input(s): TSH, T4TOTAL, T3FREE, THYROIDAB in the last 72 hours.  Invalid input(s): FREET3 ------------------------------------------------------------------------------------------------------------------ No results for input(s): VITAMINB12, FOLATE, FERRITIN, TIBC, IRON, RETICCTPCT in the last 72 hours.  Coagulation profile Recent Labs  Lab 04/12/18 0638  INR 1.09    No results for input(s): DDIMER in the last 72 hours.  Cardiac Enzymes Recent Labs  Lab 04/12/18 0510 04/12/18 0948  TROPONINI <0.03 0.03*   ------------------------------------------------------------------------------------------------------------------    Component Value Date/Time   BNP 295.2 (H) 10/21/2016 1437    Inpatient Medications  Scheduled Meds: . amLODipine  5 mg Oral Daily  . atorvastatin  40 mg Oral Daily  . cholecalciferol  1,000 Units Oral Daily  . docusate sodium  100 mg Oral BID  . enoxaparin (LOVENOX) injection  30 mg Subcutaneous Daily  . feeding supplement (GLUCERNA SHAKE)  237 mL Oral TID BM  . feeding supplement (PRO-STAT SUGAR FREE 64)  30 mL Oral BID  . folic acid  0.5 mg Oral Daily  . gabapentin  300 mg Oral TID  . guaiFENesin   1,200 mg Oral BID  . insulin aspart  0-9 Units Subcutaneous Q4H  . losartan  50 mg Oral Daily  . metoprolol tartrate  50 mg Oral BID WC  . pantoprazole  40 mg Oral Daily  . pramipexole  0.5 mg Oral QHS   Continuous Infusions: . sodium chloride 10 mL/hr at 04/15/18 2127  . sodium chloride Stopped (04/17/18 1058)  . methocarbamol (ROBAXIN) IV     PRN Meds:.acetaminophen, bisacodyl, diphenhydrAMINE, HYDROcodone-acetaminophen, HYDROcodone-acetaminophen, magnesium citrate, methocarbamol **OR** methocarbamol (ROBAXIN) IV, metoCLOPramide **OR** metoCLOPramide (REGLAN) injection, morphine injection, ondansetron **OR** ondansetron (ZOFRAN) IV, polyethylene glycol, psyllium, traMADol  Micro Results Recent Results (from the past 240 hour(s))  Culture, blood (routine x 2)     Status: None   Collection Time: 04/12/18  5:10 AM  Result Value Ref Range Status   Specimen Description BLOOD RIGHT ARM  Final  Special Requests   Final    BOTTLES DRAWN AEROBIC AND ANAEROBIC Blood Culture adequate volume   Culture   Final    NO GROWTH 5 DAYS Performed at [facility], [place] [address], [place], [place] [address]    Report Status 04/17/2018 FINAL  Final  Surgical pcr screen     Status: Abnormal   Collection Time: 04/12/18  5:19 AM  Result Value Ref Range Status   MRSA, PCR NEGATIVE NEGATIVE Final   Staphylococcus aureus POSITIVE (A) NEGATIVE Final    Comment: (NOTE) The Xpert SA Assay (FDA approved for NASAL specimens in patients 41 years of age and older), is one component of a comprehensive surveillance program. It is not intended to diagnose infection nor to guide or monitor treatment. Performed at [facility], [place] [address] [place], [place] [address]   Culture, blood (routine x 2)     Status: None   Collection Time: 04/12/18  5:25 AM  Result Value Ref Range Status   Specimen Description BLOOD RIGHT ANTECUBITAL  Final   Special Requests   Final    BOTTLES DRAWN AEROBIC AND  ANAEROBIC Blood Culture adequate volume   Culture   Final    NO GROWTH 5 DAYS Performed at [facility], [place] [address] [place], [place] [address]    Report Status 04/17/2018 FINAL  Final    Radiology Reports Dg Chest 2 View  Result Date: 04/11/2018 CLINICAL DATA:  Un witnessed fall today EXAM: CHEST - 2 VIEW COMPARISON:  08/09/2017 FINDINGS: Cardiac shadows within normal limits. No focal infiltrate or sizable effusion is seen. The lungs are well aerated with mild interstitial changes stable from the prior study. No acute bony abnormality is seen. Chronic compression deformity in the lower thoracic/upper lumbar spine is seen. IMPRESSION: Chronic changes without acute abnormality. Electronically Signed   By: Inez Catalina M.D.   On: 04/11/2018 20:54   Dg Pelvis 1-2 Views  Result Date: 04/11/2018 CLINICAL DATA:  Recent fall with pelvic pain, initial encounter EXAM: PELVIS - 1-2 VIEW COMPARISON:  08/01/2017 FINDINGS: Pelvic ring is intact. No acute fracture or dislocation is seen. Mild degenerative changes of the hip joints are noted bilaterally. IMPRESSION: No acute abnormality noted. Electronically Signed   By: Inez Catalina M.D.   On: 04/11/2018 20:55   Ct Head Wo Contrast  Result Date: 04/11/2018 CLINICAL DATA:  Head trauma. Fall this morning. EXAM: CT HEAD WITHOUT CONTRAST CT CERVICAL SPINE WITHOUT CONTRAST TECHNIQUE: Multidetector CT imaging of the head and cervical spine was performed following the standard protocol without intravenous contrast. Multiplanar CT image reconstructions of the cervical spine were also generated. COMPARISON:  08/02/2017 FINDINGS: CT HEAD FINDINGS Brain: No evidence of acute infarction, hemorrhage, hydrocephalus, extra-axial collection or mass lesion/mass effect. There is mild diffuse low-attenuation within the subcortical and periventricular white matter compatible with chronic microvascular disease. Prominence of the sulci and ventricles compatible with brain  atrophy. Vascular: No hyperdense vessel or unexpected calcification. Skull: Normal. Negative for fracture or focal lesion. Sinuses/Orbits: No acute finding. Other: None. CT CERVICAL SPINE FINDINGS Alignment: There is reversal of normal cervical lordosis. There is an anterolisthesis of C2 on C3 measuring 4 mm. Skull base and vertebrae: No acute fracture. No primary bone lesion or focal pathologic process. Soft tissues and spinal canal: No prevertebral fluid or swelling. No visible canal hematoma. Disc levels: Previous posterior laminectomy and decompression of C3 through C7. Solid fusion of C5 through C7 noted. Marked degenerative  disc disease noted at C3-4, C4-5 and C7-T1. Upper chest: Calcified granuloma noted in the medial right upper lobe. Other: None IMPRESSION: 1. No acute intracranial abnormalities. Chronic small vessel ischemic disease and brain atrophy. 2. No evidence of acute cervical spine fracture. 3. Multilevel degenerative disc disease and cervical spondylosis, as above. 4. Extensive cervical spondylosis is identified status post posterior decompression of C3 through C7. There is an anterolisthesis of C2 on C3 which is felt to likely reflect changes secondary to chronic spondylosis. Electronically Signed   By: Kerby Moors M.D.   On: 04/11/2018 21:23   Ct Cervical Spine Wo Contrast  Result Date: 04/11/2018 CLINICAL DATA:  Head trauma. Fall this morning. EXAM: CT HEAD WITHOUT CONTRAST CT CERVICAL SPINE WITHOUT CONTRAST TECHNIQUE: Multidetector CT imaging of the head and cervical spine was performed following the standard protocol without intravenous contrast. Multiplanar CT image reconstructions of the cervical spine were also generated. COMPARISON:  08/02/2017 FINDINGS: CT HEAD FINDINGS Brain: No evidence of acute infarction, hemorrhage, hydrocephalus, extra-axial collection or mass lesion/mass effect. There is mild diffuse low-attenuation within the subcortical and periventricular white matter  compatible with chronic microvascular disease. Prominence of the sulci and ventricles compatible with brain atrophy. Vascular: No hyperdense vessel or unexpected calcification. Skull: Normal. Negative for fracture or focal lesion. Sinuses/Orbits: No acute finding. Other: None. CT CERVICAL SPINE FINDINGS Alignment: There is reversal of normal cervical lordosis. There is an anterolisthesis of C2 on C3 measuring 4 mm. Skull base and vertebrae: No acute fracture. No primary bone lesion or focal pathologic process. Soft tissues and spinal canal: No prevertebral fluid or swelling. No visible canal hematoma. Disc levels: Previous posterior laminectomy and decompression of C3 through C7. Solid fusion of C5 through C7 noted. Marked degenerative disc disease noted at C3-4, C4-5 and C7-T1. Upper chest: Calcified granuloma noted in the medial right upper lobe. Other: None IMPRESSION: 1. No acute intracranial abnormalities. Chronic small vessel ischemic disease and brain atrophy. 2. No evidence of acute cervical spine fracture. 3. Multilevel degenerative disc disease and cervical spondylosis, as above. 4. Extensive cervical spondylosis is identified status post posterior decompression of C3 through C7. There is an anterolisthesis of C2 on C3 which is felt to likely reflect changes secondary to chronic spondylosis. Electronically Signed   By: Kerby Moors M.D.   On: 04/11/2018 21:23     Phillips Climes M.D on 04/18/2018 at 2:49 PM  Between 7am to 7pm - Pager - [phone]  After 7pm go to [email or web address] - password [facility]  [facility] -  Office  [phone]

## 2018-04-19 ENCOUNTER — Encounter (HOSPITAL_COMMUNITY): Payer: Self-pay | Admitting: Orthopedic Surgery

## 2018-04-19 DIAGNOSIS — M6281 Muscle weakness (generalized): Secondary | ICD-10-CM | POA: Diagnosis not present

## 2018-04-19 DIAGNOSIS — L03119 Cellulitis of unspecified part of limb: Secondary | ICD-10-CM | POA: Diagnosis not present

## 2018-04-19 DIAGNOSIS — L859 Epidermal thickening, unspecified: Secondary | ICD-10-CM | POA: Diagnosis not present

## 2018-04-19 DIAGNOSIS — M869 Osteomyelitis, unspecified: Secondary | ICD-10-CM | POA: Diagnosis not present

## 2018-04-19 DIAGNOSIS — R2689 Other abnormalities of gait and mobility: Secondary | ICD-10-CM | POA: Diagnosis not present

## 2018-04-19 DIAGNOSIS — I739 Peripheral vascular disease, unspecified: Secondary | ICD-10-CM | POA: Diagnosis not present

## 2018-04-19 DIAGNOSIS — E46 Unspecified protein-calorie malnutrition: Secondary | ICD-10-CM | POA: Diagnosis not present

## 2018-04-19 DIAGNOSIS — E1122 Type 2 diabetes mellitus with diabetic chronic kidney disease: Secondary | ICD-10-CM | POA: Diagnosis not present

## 2018-04-19 DIAGNOSIS — D509 Iron deficiency anemia, unspecified: Secondary | ICD-10-CM | POA: Diagnosis not present

## 2018-04-19 DIAGNOSIS — Z89511 Acquired absence of right leg below knee: Secondary | ICD-10-CM | POA: Diagnosis not present

## 2018-04-19 DIAGNOSIS — R279 Unspecified lack of coordination: Secondary | ICD-10-CM | POA: Diagnosis not present

## 2018-04-19 DIAGNOSIS — Z7401 Bed confinement status: Secondary | ICD-10-CM | POA: Diagnosis not present

## 2018-04-19 DIAGNOSIS — L97529 Non-pressure chronic ulcer of other part of left foot with unspecified severity: Secondary | ICD-10-CM | POA: Diagnosis not present

## 2018-04-19 DIAGNOSIS — M86271 Subacute osteomyelitis, right ankle and foot: Secondary | ICD-10-CM | POA: Diagnosis not present

## 2018-04-19 DIAGNOSIS — Z743 Need for continuous supervision: Secondary | ICD-10-CM | POA: Diagnosis not present

## 2018-04-19 DIAGNOSIS — I10 Essential (primary) hypertension: Secondary | ICD-10-CM | POA: Diagnosis not present

## 2018-04-19 DIAGNOSIS — R278 Other lack of coordination: Secondary | ICD-10-CM | POA: Diagnosis not present

## 2018-04-19 DIAGNOSIS — M2550 Pain in unspecified joint: Secondary | ICD-10-CM | POA: Diagnosis not present

## 2018-04-19 DIAGNOSIS — I629 Nontraumatic intracranial hemorrhage, unspecified: Secondary | ICD-10-CM | POA: Diagnosis not present

## 2018-04-19 DIAGNOSIS — N183 Chronic kidney disease, stage 3 (moderate): Secondary | ICD-10-CM | POA: Diagnosis not present

## 2018-04-19 DIAGNOSIS — E785 Hyperlipidemia, unspecified: Secondary | ICD-10-CM | POA: Diagnosis not present

## 2018-04-19 DIAGNOSIS — E119 Type 2 diabetes mellitus without complications: Secondary | ICD-10-CM | POA: Diagnosis not present

## 2018-04-19 DIAGNOSIS — S98111A Complete traumatic amputation of right great toe, initial encounter: Secondary | ICD-10-CM | POA: Diagnosis not present

## 2018-04-19 DIAGNOSIS — G2581 Restless legs syndrome: Secondary | ICD-10-CM | POA: Diagnosis not present

## 2018-04-19 DIAGNOSIS — K219 Gastro-esophageal reflux disease without esophagitis: Secondary | ICD-10-CM | POA: Diagnosis not present

## 2018-04-19 DIAGNOSIS — E1129 Type 2 diabetes mellitus with other diabetic kidney complication: Secondary | ICD-10-CM | POA: Diagnosis not present

## 2018-04-19 LAB — GLUCOSE, CAPILLARY
GLUCOSE-CAPILLARY: 158 mg/dL — AB (ref 70–99)
GLUCOSE-CAPILLARY: 264 mg/dL — AB (ref 70–99)
GLUCOSE-CAPILLARY: 268 mg/dL — AB (ref 70–99)
GLUCOSE-CAPILLARY: 299 mg/dL — AB (ref 70–99)
Glucose-Capillary: 137 mg/dL — ABNORMAL HIGH (ref 70–99)
Glucose-Capillary: 306 mg/dL — ABNORMAL HIGH (ref 70–99)

## 2018-04-19 LAB — CBC
HEMATOCRIT: 28.7 % — AB (ref 36.0–46.0)
HEMOGLOBIN: 8.5 g/dL — AB (ref 12.0–15.0)
MCH: 26.2 pg (ref 26.0–34.0)
MCHC: 29.6 g/dL — AB (ref 30.0–36.0)
MCV: 88.3 fL (ref 78.0–100.0)
Platelets: 422 10*3/uL — ABNORMAL HIGH (ref 150–400)
RBC: 3.25 MIL/uL — ABNORMAL LOW (ref 3.87–5.11)
RDW: 16.2 % — ABNORMAL HIGH (ref 11.5–15.5)
WBC: 12.4 10*3/uL — AB (ref 4.0–10.5)

## 2018-04-19 LAB — BASIC METABOLIC PANEL
ANION GAP: 5 (ref 5–15)
BUN: 24 mg/dL — ABNORMAL HIGH (ref 8–23)
CHLORIDE: 104 mmol/L (ref 98–111)
CO2: 29 mmol/L (ref 22–32)
CREATININE: 1.09 mg/dL — AB (ref 0.44–1.00)
Calcium: 8.6 mg/dL — ABNORMAL LOW (ref 8.9–10.3)
GFR calc non Af Amer: 47 mL/min — ABNORMAL LOW (ref >60)
GFR, EST AFRICAN AMERICAN: 54 mL/min — AB (ref >60)
Glucose, Bld: 152 mg/dL — ABNORMAL HIGH (ref 70–99)
POTASSIUM: 4.4 mmol/L (ref 3.5–5.1)
SODIUM: 138 mmol/L (ref 135–145)

## 2018-04-19 MED ORDER — INSULIN GLARGINE 100 UNIT/ML ~~LOC~~ SOLN: 4.0000 [IU] | Freq: Every day | SUBCUTANEOUS | 11 refills | Status: AC

## 2018-04-19 MED ORDER — GLUCERNA SHAKE PO LIQD: 237.0000 mL | Freq: Three times a day (TID) | ORAL | 0 refills | Status: AC

## 2018-04-19 MED ORDER — BISACODYL 10 MG RE SUPP: 10.0000 mg | Freq: Every day | RECTAL | 0 refills | Status: AC | PRN

## 2018-04-19 MED ORDER — PRO-STAT SUGAR FREE PO LIQD: 30.0000 mL | Freq: Two times a day (BID) | ORAL | 0 refills | Status: AC

## 2018-04-19 MED ORDER — HYDROCODONE-ACETAMINOPHEN 7.5-325 MG PO TABS: 1.0000 | ORAL_TABLET | ORAL | 0 refills | Status: AC | PRN

## 2018-04-19 MED ORDER — INSULIN GLARGINE 100 UNIT/ML ~~LOC~~ SOLN
4.0000 [IU] | Freq: Every day | SUBCUTANEOUS | Status: DC
  Administered 2018-04-19: 4 [IU] via SUBCUTANEOUS
  Filled 2018-04-19 (×2): qty 0.04

## 2018-04-19 MED ORDER — INSULIN ASPART 100 UNIT/ML ~~LOC~~ SOLN: 0.0000 [IU] | Freq: Three times a day (TID) | SUBCUTANEOUS | 11 refills | Status: AC

## 2018-04-19 MED ORDER — DOCUSATE SODIUM 100 MG PO CAPS: 100.0000 mg | ORAL_CAPSULE | Freq: Two times a day (BID) | ORAL | 0 refills | Status: AC

## 2018-04-19 NOTE — Discharge Summary (Signed)
Sara Griffith, is a 82 y.o. female  DOB 08-18-1936  MRN [id number].  Admission date:  04/11/2018  Admitting Physician  Jani Gravel, MD  Discharge Date:  04/19/2018   Primary MD  Kathyrn Lass, MD  Recommendations for primary care physician for things to follow:  -Check CBC, BMP in 3 days -Orthopedic in 1 week -Continue with wound VAC for 2 weeks,   Admission Diagnosis  Dehydration [E86.0] Fall, initial encounter [W19.XXXA] Leukocytosis, unspecified type [D72.829] Cellulitis [L03.90]   Discharge Diagnosis  Dehydration [E86.0] Fall, initial encounter [W19.XXXA] Leukocytosis, unspecified type [D72.829] Cellulitis [L03.90]    Principal Problem:   Cellulitis Active Problems:   HTN (hypertension)   Type 2 diabetes mellitus without complication, without long-term current use of insulin (HCC)   Skin ulcer ([place])   Osteomyelitis of right foot ([place])      Past Medical History:  Diagnosis Date  . Chronic osteomyelitis of toe, right ([place])   . CKD (chronic kidney disease)   . Diabetes mellitus without complication ([place])   . GERD (gastroesophageal reflux disease)   . HLD (hyperlipidemia)   . Hypertension   . IDA (iron deficiency anemia)   . PVD (peripheral vascular disease) ([place])   . RLS (restless legs syndrome)   . TIA (transient ischemic attack)   . Uses walker   . Vitamin D deficiency     Past Surgical History:  Procedure Laterality Date  . ABDOMINAL HYSTERECTOMY     with bladder tac  . AMPUTATION Right 08/07/2017   Procedure: RIGHT GREAT TOE AMPUTATION AT METATARSOPHALANGEAL JOINT;  Surgeon: Newt Minion, MD;  Location: [facility];  Service: Orthopedics;  Laterality: Right;  . APPENDECTOMY    . CARPAL TUNNEL RELEASE Right   . CATARACT EXTRACTION, BILATERAL    . CERVICAL DISC SURGERY    . COLONOSCOPY    . DILATION AND CURETTAGE OF UTERUS     x 2  . left great toe amputation    . ROTATOR CUFF  REPAIR Left        History of present illness and  Hospital Course:     Kindly see H&P for history of present illness and admission details, please review complete Labs, Consult reports and Test reports for all details in brief  HPI  from the history and physical done on the day of admission 04/12/2018 Sara Griffith  is a 82 y.o. female, w hypertension, dm2, ckd stage 3, severe protein calorie malnutrition, intracranial hemorrhage, pvd, chronic osteomyelitis of the right foot, apparently presents with fall where her legs gave out and she hit her lip and head.  Pt denies syncope, cp, palp, sob, n/v, abd pain, diarrhea, brbpr.   In ED,  Pt was noted to have increase in redness of the bilateral distal lower ext .   Glucose 205 Bun 15, Creatinine 1.11 Ast 28, Alt 20 Wbc 15.3, Hgb 9.3, Plt 444 CPK 215 Urinalysis prot 30  CT brain IMPRESSION: 1. No acute intracranial abnormalities. Chronic small vessel ischemic disease and brain atrophy. 2.  No evidence of acute cervical spine fracture. 3. Multilevel degenerative disc disease and cervical spondylosis, as above. 4. Extensive cervical spondylosis is identified status post posterior decompression of C3 through C7. There is an anterolisthesis of C2 on C3 which is felt to likely reflect changes secondary to chronic spondylosis. Pt will be admitted for cellulitis and skin ulcer on the bottom of the right foot which is wheeping yellow discharge as well as anemia, and tachycardia.   CXR IMPRESSION: Chronic changes without acute abnormality.  Pelvis IMPRESSION: No acute abnormality noted.     Hospital Course  [name] a9 y.o.female,w hypertension, dm2, ckd stage 3, severe protein calorie malnutrition,intracranial hemorrhage,pvd, chronic osteomyelitis of the right foot, presentedwith afall where her legs gave out and she hit her lip and head. Pt denies syncope, cp, palp, sob, n/v, abd pain, diarrhea, brbpr. In ED  pt was noted to have increase in redness of the bilateral distal lower ext .  She is being followed by Dr. Sharol Given for known subacute osteomyelitis of the right foot, she had a plan for transtibial amputation 2 weeks as an outpatient.  She was seen by orthopedic, she went for transtibial amputation 04/16/2018 by Dr. Sharol Given   Subacute Osteomyelitis of right ankle and foot/cellulitis/nonhealing wound -This appears to be chronic problem, leukocytosis on admission 15.3, has been improved on broad-spectrum antibiotic, was empirically on vancomycin and Zosyn, off antibiotics 04/17/2018 -Management per orthopedic team, patient is well-known to Dr. Sharol Given from orthopedic service, by reviewing outpatient orthopedic records, she does appear to be having subacute osteomyelitis with ulceration and cellulitis, plan was for transtibial amputation in 2 weeks as an outpatient, she went for transtibial amputation 04/16/2018 by Dr. Sharol Given.  Is to be discharged on wound VAC for 2 weeks, to see him in the office in 1 week  Sinus tachycardia -Reolved,   Diabetes mellitus type 2 -  continue with  Tradjenta, will add insulin sliding scale  Hypertension -Acceptable, continue with home regimen of amlodipine, losartan and metoprolol   Hyperlipidemia - Cont Lipitor 40mg  po qhs  Prior h/o intracranial hemorrhage - not taking Keppra anymore, ICH was > 6 mos ago  RLS  - Cont Mirapex 0.5mg  po qhs  Severe protein calorie malnutrition -Continue with pro-stat and Glucerna   Code Status : DNR  Discharge Condition:  stable   Follow UP   Contact information for follow-up providers    Kathyrn Lass, MD.   Specialty:  Family Medicine Contact information: [place] [address] [phone]        Newt Minion, MD In 1 week.   Specialty:  Orthopedic Surgery Contact information: [place] [address] [phone]            Contact information for  after-discharge care    Destination    [facility] .   Service:  Skilled Nursing Contact information: [place] [phone]                    Discharge Instructions  and  Discharge Medications    Discharge Instructions    Discharge instructions   Complete by:  As directed    Follow with Primary MD Kathyrn Lass, MD or SNF physician in 7 days  Get CBC, CMP, checked  by Primary MD next visit.    Activity: As tolerated with Full fall precautions use walker/cane & assistance as needed   Disposition Home   Diet:  Heart Healthy , carbohydrate modified, with feeding assistance and aspiration precautions.  For Heart failure patients - Check your Weight same time everyday, if you gain over 2 pounds, or you develop in leg swelling, experience more shortness of breath or chest pain, call your Primary MD immediately. Follow Cardiac Low Salt Diet and 1.5 lit/day fluid restriction.   On your next visit with your primary care physician please Get Medicines reviewed and adjusted.   Please request your Prim.MD to go over all Hospital Tests and Procedure/Radiological results at the follow up, please get all Hospital records sent to your Prim MD by signing hospital release before you go home.   If you experience worsening of your admission symptoms, develop shortness of breath, life threatening emergency, suicidal or homicidal thoughts you must seek medical attention immediately by calling 911 or calling your MD immediately  if symptoms less severe.  You Must read complete instructions/literature along with all the possible adverse reactions/side effects for all the Medicines you take and that have been prescribed to you. Take any new Medicines after you have completely understood and accpet all the possible adverse reactions/side effects.   Do not drive, operating heavy machinery, perform activities at  heights, swimming or participation in water activities or provide baby sitting services if your were admitted for syncope or siezures until you have seen by Primary MD or a Neurologist and advised to do so again.  Do not drive when taking Pain medications.    Do not take more than prescribed Pain, Sleep and Anxiety Medications  Special Instructions: If you have smoked or chewed Tobacco  in the last 2 yrs please stop smoking, stop any regular Alcohol  and or any Recreational drug use.  Wear Seat belts while driving.   Please note  You were cared for by a hospitalist during your hospital stay. If you have any questions about your discharge medications or the care you received while you were in the hospital after you are discharged, you can call the unit and asked to speak with the hospitalist on call if the hospitalist that took care of you is not available. Once you are discharged, your primary care physician will handle any further medical issues. Please note that NO REFILLS for any discharge medications will be authorized once you are discharged, as it is imperative that you return to your primary care physician (or establish a relationship with a primary care physician if you do not have one) for your aftercare needs so that they can reassess your need for medications and monitor your lab values.   Negative Pressure Wound Therapy - Incisional   Complete by:  As directed    Continue VAC for a total of 2 weeks treatment, plug in to maintain charge     Allergies as of 04/19/2018      Reactions   Cymbalta [duloxetine Hcl] Swelling   SWELLING REACTION UNSPECIFIED    Librax [chlordiazepoxide-clidinium] Swelling   SWELLING REACTION UNSPECIFIED    Lyrica [pregabalin] Swelling   SWELLING REACTION UNSPECIFIED    Naproxen Swelling   SWELLING REACTION UNSPECIFIED       Medication List    STOP taking these medications   traMADol 50 MG tablet Commonly known as:  ULTRAM     TAKE these  medications   amLODipine 5 MG tablet Commonly known as:  NORVASC Take 1 tablet (5 mg total) by mouth daily. Please make overdue yearly appt with Dr. Burt Knack before anymore refills. 2nd attempt What changed:  additional instructions   atorvastatin 40 MG tablet Commonly known as:  LIPITOR Take 40 mg by mouth at bedtime.   bisacodyl 10 MG suppository Commonly known as:  DULCOLAX Place 1 suppository (10 mg total) rectally daily as needed for moderate constipation.   cholecalciferol 1000 units tablet Commonly known as:  VITAMIN D Take 1,000 Units by mouth daily.   docusate sodium 100 MG capsule Commonly known as:  COLACE Take 1 capsule (100 mg total) by mouth 2 (two) times daily.   feeding supplement (GLUCERNA SHAKE) Liqd Take 237 mLs by mouth 3 (three) times daily between meals.   feeding supplement (PRO-STAT SUGAR FREE 64) Liqd Take 30 mLs by mouth 2 (two) times daily.   HYDROcodone-acetaminophen 7.5-325 MG tablet Commonly known as:  NORCO Take 1 tablet by mouth every 4 (four) hours as needed for severe pain (pain score 7-10).   insulin aspart 100 UNIT/ML injection Commonly known as:  novoLOG Inject 0-9 Units into the skin 3 (three) times daily with meals.   insulin glargine 100 UNIT/ML injection Commonly known as:  LANTUS Inject 0.04 mLs (4 Units total) into the skin daily. Start taking on:  04/20/2018   linagliptin 5 MG Tabs tablet Commonly known as:  TRADJENTA Take 5 mg by mouth daily.   losartan 50 MG tablet Commonly known as:  COZAAR Take 1 tablet (50 mg total) by mouth daily.   metoprolol tartrate 50 MG tablet Commonly known as:  LOPRESSOR Take 1 tablet (50 mg total) by mouth 2 (two) times daily with a meal.   omega-3 acid ethyl esters 1 g capsule Commonly known as:  LOVAZA Take 1 g by mouth 3 (three) times daily.   omeprazole 40 MG capsule Commonly known as:  PRILOSEC Take 40 mg by mouth daily.   pramipexole 0.5 MG tablet Commonly known as:   MIRAPEX Take 0.5 mg by mouth at bedtime.   vitamin E 400 UNIT capsule Take 400 Units by mouth daily.         Diet and Activity recommendation: See Discharge Instructions above   Consults obtained -  Orthopedic dr Sharol Given   Major procedures and Radiology Reports - PLEASE review detailed and final reports for all details, in brief -  Right below-knee amputation by Dr. Sharol Given on 04/16/2018    Dg Chest 2 View  Result Date: 04/11/2018 CLINICAL DATA:  Un witnessed fall today EXAM: CHEST - 2 VIEW COMPARISON:  08/09/2017 FINDINGS: Cardiac shadows within normal limits. No focal infiltrate or sizable effusion is seen. The lungs are well aerated with mild interstitial changes stable from the prior study. No acute bony abnormality is seen. Chronic compression deformity in the lower thoracic/upper lumbar spine is seen. IMPRESSION: Chronic changes without acute abnormality. Electronically Signed   By: Inez Catalina M.D.   On: 04/11/2018 20:54   Dg Pelvis 1-2 Views  Result Date: 04/11/2018 CLINICAL DATA:  Recent fall with pelvic pain, initial encounter EXAM: PELVIS - 1-2 VIEW COMPARISON:  08/01/2017 FINDINGS: Pelvic ring is intact. No acute fracture or dislocation is seen. Mild degenerative changes of the hip joints are noted bilaterally. IMPRESSION: No acute abnormality noted. Electronically Signed   By: Inez Catalina M.D.   On: 04/11/2018 20:55   Ct Head Wo Contrast  Result Date: 04/11/2018 CLINICAL DATA:  Head trauma. Fall this morning. EXAM: CT HEAD WITHOUT CONTRAST CT CERVICAL SPINE WITHOUT CONTRAST TECHNIQUE: Multidetector CT imaging of the head and cervical spine was performed following the standard protocol without intravenous contrast. Multiplanar  CT image reconstructions of the cervical spine were also generated. COMPARISON:  08/02/2017 FINDINGS: CT HEAD FINDINGS Brain: No evidence of acute infarction, hemorrhage, hydrocephalus, extra-axial collection or mass lesion/mass effect. There is mild  diffuse low-attenuation within the subcortical and periventricular white matter compatible with chronic microvascular disease. Prominence of the sulci and ventricles compatible with brain atrophy. Vascular: No hyperdense vessel or unexpected calcification. Skull: Normal. Negative for fracture or focal lesion. Sinuses/Orbits: No acute finding. Other: None. CT CERVICAL SPINE FINDINGS Alignment: There is reversal of normal cervical lordosis. There is an anterolisthesis of C2 on C3 measuring 4 mm. Skull base and vertebrae: No acute fracture. No primary bone lesion or focal pathologic process. Soft tissues and spinal canal: No prevertebral fluid or swelling. No visible canal hematoma. Disc levels: Previous posterior laminectomy and decompression of C3 through C7. Solid fusion of C5 through C7 noted. Marked degenerative disc disease noted at C3-4, C4-5 and C7-T1. Upper chest: Calcified granuloma noted in the medial right upper lobe. Other: None IMPRESSION: 1. No acute intracranial abnormalities. Chronic small vessel ischemic disease and brain atrophy. 2. No evidence of acute cervical spine fracture. 3. Multilevel degenerative disc disease and cervical spondylosis, as above. 4. Extensive cervical spondylosis is identified status post posterior decompression of C3 through C7. There is an anterolisthesis of C2 on C3 which is felt to likely reflect changes secondary to chronic spondylosis. Electronically Signed   By: Kerby Moors M.D.   On: 04/11/2018 21:23   Ct Cervical Spine Wo Contrast  Result Date: 04/11/2018 CLINICAL DATA:  Head trauma. Fall this morning. EXAM: CT HEAD WITHOUT CONTRAST CT CERVICAL SPINE WITHOUT CONTRAST TECHNIQUE: Multidetector CT imaging of the head and cervical spine was performed following the standard protocol without intravenous contrast. Multiplanar CT image reconstructions of the cervical spine were also generated. COMPARISON:  08/02/2017 FINDINGS: CT HEAD FINDINGS Brain: No evidence of  acute infarction, hemorrhage, hydrocephalus, extra-axial collection or mass lesion/mass effect. There is mild diffuse low-attenuation within the subcortical and periventricular white matter compatible with chronic microvascular disease. Prominence of the sulci and ventricles compatible with brain atrophy. Vascular: No hyperdense vessel or unexpected calcification. Skull: Normal. Negative for fracture or focal lesion. Sinuses/Orbits: No acute finding. Other: None. CT CERVICAL SPINE FINDINGS Alignment: There is reversal of normal cervical lordosis. There is an anterolisthesis of C2 on C3 measuring 4 mm. Skull base and vertebrae: No acute fracture. No primary bone lesion or focal pathologic process. Soft tissues and spinal canal: No prevertebral fluid or swelling. No visible canal hematoma. Disc levels: Previous posterior laminectomy and decompression of C3 through C7. Solid fusion of C5 through C7 noted. Marked degenerative disc disease noted at C3-4, C4-5 and C7-T1. Upper chest: Calcified granuloma noted in the medial right upper lobe. Other: None IMPRESSION: 1. No acute intracranial abnormalities. Chronic small vessel ischemic disease and brain atrophy. 2. No evidence of acute cervical spine fracture. 3. Multilevel degenerative disc disease and cervical spondylosis, as above. 4. Extensive cervical spondylosis is identified status post posterior decompression of C3 through C7. There is an anterolisthesis of C2 on C3 which is felt to likely reflect changes secondary to chronic spondylosis. Electronically Signed   By: Kerby Moors M.D.   On: 04/11/2018 21:23    Micro Results   Recent Results (from the past 240 hour(s))  Culture, blood (routine x 2)     Status: None   Collection Time: 04/12/18  5:10 AM  Result Value Ref Range Status   Specimen Description BLOOD RIGHT ARM  Final   Special Requests   Final    BOTTLES DRAWN AEROBIC AND ANAEROBIC Blood Culture adequate volume   Culture   Final    NO GROWTH 5  DAYS Performed at [facility], [address], [place], [place] [address]    Report Status 04/17/2018 FINAL  Final  Surgical pcr screen     Status: Abnormal   Collection Time: 04/12/18  5:19 AM  Result Value Ref Range Status   MRSA, PCR NEGATIVE NEGATIVE Final   Staphylococcus aureus POSITIVE (A) NEGATIVE Final    Comment: (NOTE) The Xpert SA Assay (FDA approved for NASAL specimens in patients 47 years of age and older), is one component of a comprehensive surveillance program. It is not intended to diagnose infection nor to guide or monitor treatment. Performed at [facility], [place] [address] [place], [place] [address]   Culture, blood (routine x 2)     Status: None   Collection Time: 04/12/18  5:25 AM  Result Value Ref Range Status   Specimen Description BLOOD RIGHT ANTECUBITAL  Final   Special Requests   Final    BOTTLES DRAWN AEROBIC AND ANAEROBIC Blood Culture adequate volume   Culture   Final    NO GROWTH 5 DAYS Performed at [facility], [place] [address] [place], [place] [address]    Report Status 04/17/2018 FINAL  Final       Today   Subjective:   Austine Bonenberger today has no headache,no chest or abdominal pain, does report some right upper extremity twitching, but it has significantly subsided Objective:   Blood pressure (!) 140/55, pulse 70, temperature 98.6 F (37 C), temperature source Oral, resp. rate 19, height 4\' 11"  (1.499 m), weight 59.3 kg, SpO2 99 %.   Intake/Output Summary (Last 24 hours) at 04/19/2018 1313 Last data filed at 04/19/2018 1000 Gross per 24 hour  Intake 240 ml  Output 550 ml  Net -310 ml    Exam Awake Alert, Oriented x 3, frail, thin appearing ,no new F.N deficits, Normal affect Symmetrical Chest wall movement, Good air movement bilaterally, CTAB RRR,No Gallops,Rubs or new Murmurs, No Parasternal Heave +ve B.Sounds, Abd Soft, Non tender, No rebound -guarding or rigidity. Status post right BKA, wound VAC, with  biotic brace bruise  Data Review   CBC w Diff:  Lab Results  Component Value Date   WBC 12.4 (H) 04/19/2018   HGB 8.5 (L) 04/19/2018   HCT 28.7 (L) 04/19/2018   PLT 422 (H) 04/19/2018   LYMPHOPCT 10 04/11/2018   MONOPCT 5 04/11/2018   EOSPCT 2 04/11/2018   BASOPCT 0 04/11/2018    CMP:  Lab Results  Component Value Date   NA 138 04/19/2018   K 4.4 04/19/2018   CL 104 04/19/2018   CO2 29 04/19/2018   BUN 24 (H) 04/19/2018   CREATININE 1.09 (H) 04/19/2018   PROT 6.1 (L) 04/12/2018   ALBUMIN 1.8 (L) 04/12/2018   BILITOT 0.6 04/12/2018   ALKPHOS 77 04/12/2018   AST 26 04/12/2018   ALT 18 04/12/2018  .   Total Time in preparing paper work, data evaluation and todays exam - 61 minutes  Phillips Climes M.D on 04/19/2018 at 1:13 PM  [facility]   Office  [phone]

## 2018-04-19 NOTE — Discharge Instructions (Signed)
Follow with Primary MD Kathyrn Lass, MD or SNF physician in 7 days  Get CBC, CMP, checked  by Primary MD next visit.    Activity: As tolerated with Full fall precautions use walker/cane & assistance as needed   Disposition Home   Diet: Heart Healthy , carbohydrate modified, with feeding assistance and aspiration precautions.  For Heart failure patients - Check your Weight same time everyday, if you gain over 2 pounds, or you develop in leg swelling, experience more shortness of breath or chest pain, call your Primary MD immediately. Follow Cardiac Low Salt Diet and 1.5 lit/day fluid restriction.   On your next visit with your primary care physician please Get Medicines reviewed and adjusted.   Please request your Prim.MD to go over all Hospital Tests and Procedure/Radiological results at the follow up, please get all Hospital records sent to your Prim MD by signing hospital release before you go home.   If you experience worsening of your admission symptoms, develop shortness of breath, life threatening emergency, suicidal or homicidal thoughts you must seek medical attention immediately by calling 911 or calling your MD immediately  if symptoms less severe.  You Must read complete instructions/literature along with all the possible adverse reactions/side effects for all the Medicines you take and that have been prescribed to you. Take any new Medicines after you have completely understood and accpet all the possible adverse reactions/side effects.   Do not drive, operating heavy machinery, perform activities at heights, swimming or participation in water activities or provide baby sitting services if your were admitted for syncope or siezures until you have seen by Primary MD or a Neurologist and advised to do so again.  Do not drive when taking Pain medications.    Do not take more than prescribed Pain, Sleep and Anxiety Medications  Special Instructions: If you have smoked or  chewed Tobacco  in the last 2 yrs please stop smoking, stop any regular Alcohol  and or any Recreational drug use.  Wear Seat belts while driving.   Please note  You were cared for by a hospitalist during your hospital stay. If you have any questions about your discharge medications or the care you received while you were in the hospital after you are discharged, you can call the unit and asked to speak with the hospitalist on call if the hospitalist that took care of you is not available. Once you are discharged, your primary care physician will handle any further medical issues. Please note that NO REFILLS for any discharge medications will be authorized once you are discharged, as it is imperative that you return to your primary care physician (or establish a relationship with a primary care physician if you do not have one) for your aftercare needs so that they can reassess your need for medications and monitor your lab values.

## 2018-04-19 NOTE — Progress Notes (Signed)
Physical Therapy Treatment Patient Details Name: Sara Griffith MRN: [id number] DOB: [birth year] Today's Date: 04/19/2018    History of Present Illness Pt is an 82 y.o. female with PMH of hypertension, dm2, ckd stage 3, severe protein calorie malnutrition, intracranial hemorrhage, pvd, chronic osteomyelitis of the right foot, and s/p bilat great toe amputations. She presented to the ED after a fall where her legs gave out and she hit her lip and head.  Pt was scheduled to undergo R BKA in 2 weeks due to cellulitis and osteomyelitis R foot. Surgery was moved up due to admission and R BKA performed 04-16-18.     PT Comments    Pt with improved tolerance for mobility. Able to perform supine HEP and transfer to recliner using stedy and min A. Pt heavily reliant on UEs for mobility. Current recommendations appropriate. Will continue to follow acutely to maximize functional mobility independence and safety.    Follow Up Recommendations  SNF     Equipment Recommendations  None recommended by PT    Recommendations for Other Services       Precautions / Restrictions Precautions Precautions: Fall;Other (comment) Precaution Comments: wound vac Restrictions Weight Bearing Restrictions: Yes RLE Weight Bearing: Non weight bearing    Mobility  Bed Mobility Overal bed mobility: Needs Assistance Bed Mobility: Supine to Sit     Supine to sit: Min assist     General bed mobility comments: Min A for trunk elevation and to assist with scooting hips to EOB. Pt reporting some dizziness in sitting and required seated rest for symptoms to improve.   Transfers Overall transfer level: Needs assistance   Transfers: Sit to/from Stand Sit to Stand: Min assist         General transfer comment: Min A for lift assist and steadying to stand using stedy X2. Heavy reliance on UE support to stand. Used stedy to transfer pt to chair.   Ambulation/Gait                 Stairs              Wheelchair Mobility    Modified Rankin (Stroke Patients Only)       Balance Overall balance assessment: Needs assistance Sitting-balance support: Single extremity supported;Feet unsupported Sitting balance-Leahy Scale: Fair     Standing balance support: Bilateral upper extremity supported Standing balance-Leahy Scale: Poor Standing balance comment: HEavy reliance on UE support.                             Cognition Arousal/Alertness: Awake/alert Behavior During Therapy: WFL for tasks assessed/performed Overall Cognitive Status: Within Functional Limits for tasks assessed                                        Exercises Amputee Exercises Quad Sets: AROM;Right;10 reps;Supine Hip ABduction/ADduction: AROM;Right;10 reps;Supine Straight Leg Raises: AROM;Right;10 reps;Supine    General Comments        Pertinent Vitals/Pain Pain Assessment: No/denies pain    Home Living                      Prior Function            PT Goals (current goals can now be found in the care plan section) Acute Rehab PT Goals Patient Stated Goal: get better PT Goal Formulation: With  patient Time For Goal Achievement: 05/01/18 Potential to Achieve Goals: Good Progress towards PT goals: Progressing toward goals    Frequency    Min 3X/week      PT Plan Current plan remains appropriate    Co-evaluation              AM-PAC PT "6 Clicks" Daily Activity  Outcome Measure  Difficulty turning over in bed (including adjusting bedclothes, sheets and blankets)?: A Lot Difficulty moving from lying on back to sitting on the side of the bed? : Unable Difficulty sitting down on and standing up from a chair with arms (e.g., wheelchair, bedside commode, etc,.)?: Unable Help needed moving to and from a bed to chair (including a wheelchair)?: A Lot Help needed walking in hospital room?: Total Help needed climbing 3-5 steps with a railing? : Total 6  Click Score: 8    End of Session Equipment Utilized During Treatment: Gait belt;Oxygen Activity Tolerance: Patient tolerated treatment well Patient left: in chair;with call bell/phone within reach;with chair alarm set Nurse Communication: Mobility status;Other (comment)(use stedy to transfer) PT Visit Diagnosis: Other abnormalities of gait and mobility (R26.89);Muscle weakness (generalized) (M62.81)     Time: 7673-4193 PT Time Calculation (min) (ACUTE ONLY): 28 min  Charges:  $Therapeutic Activity: 23-37 mins                     Sara Griffith, PT, DPT  Acute Rehabilitation Services  Pager: [phone]    Sara Griffith 04/19/2018, 1:49 PM

## 2018-04-19 NOTE — Progress Notes (Signed)
Report called to [facility] at [facility]. Preveena wound vac in place and IV removed. Currently waiting on transport.

## 2018-04-19 NOTE — Progress Notes (Addendum)
[facility] discharged Skilled nursing facility per MD order.  Discharge instructions reviewed and discussed with the patient, all questions and concerns answered. Copy of instructions and care notes for new diagnosis and medication given to patient, as well as copy of AVS and scripts placed in packet for facility.  Allergies as of 04/19/2018      Reactions   Cymbalta [duloxetine Hcl] Swelling   SWELLING REACTION UNSPECIFIED    Librax [chlordiazepoxide-clidinium] Swelling   SWELLING REACTION UNSPECIFIED    Lyrica [pregabalin] Swelling   SWELLING REACTION UNSPECIFIED    Naproxen Swelling   SWELLING REACTION UNSPECIFIED       Medication List    STOP taking these medications   traMADol 50 MG tablet Commonly known as:  ULTRAM     TAKE these medications   amLODipine 5 MG tablet Commonly known as:  NORVASC Take 1 tablet (5 mg total) by mouth daily. Please make overdue yearly appt with Dr. Burt Knack before anymore refills. 2nd attempt What changed:  additional instructions   atorvastatin 40 MG tablet Commonly known as:  LIPITOR Take 40 mg by mouth at bedtime.   bisacodyl 10 MG suppository Commonly known as:  DULCOLAX Place 1 suppository (10 mg total) rectally daily as needed for moderate constipation.   cholecalciferol 1000 units tablet Commonly known as:  VITAMIN D Take 1,000 Units by mouth daily.   docusate sodium 100 MG capsule Commonly known as:  COLACE Take 1 capsule (100 mg total) by mouth 2 (two) times daily.   feeding supplement (GLUCERNA SHAKE) Liqd Take 237 mLs by mouth 3 (three) times daily between meals.   feeding supplement (PRO-STAT SUGAR FREE 64) Liqd Take 30 mLs by mouth 2 (two) times daily.   HYDROcodone-acetaminophen 7.5-325 MG tablet Commonly known as:  NORCO Take 1 tablet by mouth every 4 (four) hours as needed for severe pain (pain score 7-10).   insulin aspart 100 UNIT/ML injection Commonly known as:  novoLOG Inject 0-9 Units into the skin 3 (three)  times daily with meals.   insulin glargine 100 UNIT/ML injection Commonly known as:  LANTUS Inject 0.04 mLs (4 Units total) into the skin daily. Start taking on:  04/20/2018   linagliptin 5 MG Tabs tablet Commonly known as:  TRADJENTA Take 5 mg by mouth daily.   losartan 50 MG tablet Commonly known as:  COZAAR Take 1 tablet (50 mg total) by mouth daily.   metoprolol tartrate 50 MG tablet Commonly known as:  LOPRESSOR Take 1 tablet (50 mg total) by mouth 2 (two) times daily with a meal.   omega-3 acid ethyl esters 1 g capsule Commonly known as:  LOVAZA Take 1 g by mouth 3 (three) times daily.   omeprazole 40 MG capsule Commonly known as:  PRILOSEC Take 40 mg by mouth daily.   pramipexole 0.5 MG tablet Commonly known as:  MIRAPEX Take 0.5 mg by mouth at bedtime.   vitamin E 400 UNIT capsule Take 400 Units by mouth daily.      Switched pt. Over to the prevena plus wound VAC for discharge. Patient will be escorted by PTAR to [facility],  no distress noted upon discharge.  Wynetta Emery, Tanor Glaspy C 04/19/2018 3:54 PM

## 2018-04-19 NOTE — Progress Notes (Signed)
Insurance authorization received for patient to discharge to [facility] today. MD and daughter aware.   Percell Locus Zaydrian Batta LCSW [phone]

## 2018-04-19 NOTE — Progress Notes (Signed)
Patient ID: Sara Griffith, female   DOB: 17-Jun-1936, 82 y.o.   MRN: [id number] Patient is comfortable without complaints this morning.  There is no drainage in the wound VAC canister.  Patient has her stump protector in place.  Plan for discharge to skilled nursing.

## 2018-04-19 NOTE — Clinical Social Work Placement (Signed)
   CLINICAL SOCIAL WORK PLACEMENT  NOTE  Date:  04/19/2018  Patient Details  Name: Sara Griffith MRN: [id number] Date of Birth: 05/20/1936  Clinical Social Work is seeking post-discharge placement for this patient at the [place] level of care (*CSW will initial, date and re-position this form in  chart as items are completed):  Yes   Patient/family provided with [place] Work Department's list of facilities offering this level of care within the geographic area requested by the patient (or if unable, by the patient's family).  Yes   Patient/family informed of their freedom to choose among providers that offer the needed level of care, that participate in Medicare, Medicaid or managed care program needed by the patient, have an available bed and are willing to accept the patient.  Yes   Patient/family informed of [facility] ownership interest in [facility] and [facility], as well as of the fact that they are under no obligation to receive care at these facilities.  PASRR submitted to EDS on       PASRR number received on       Existing PASRR number confirmed on 04/19/18     FL2 transmitted to all facilities in geographic area requested by pt/family on 04/19/18     FL2 transmitted to all facilities within larger geographic area on       Patient informed that his/her managed care company has contracts with or will negotiate with certain facilities, including the following:        Yes   Patient/family informed of bed offers received.  Patient chooses bed at [facility]     Physician recommends and patient chooses bed at      Patient to be transferred to [facility] on 04/19/18.  Patient to be transferred to facility by PTAR     Patient family notified on 04/19/18 of transfer.  Name of family member notified:  Katharine Look, daughter     PHYSICIAN       Additional Comment:     _______________________________________________ Benard Halsted, [place] 04/19/2018, 12:09 PM

## 2018-04-19 NOTE — Progress Notes (Signed)
Patient will DC to: [facility] Anticipated DC date: 04/19/18 Family notified: Daughter-Sandra Transport by: Corey Harold   Per MD patient ready for DC to [facility]. RN, patient, patient's family, and facility notified of DC. Discharge Summary sent to facility. RN given number for report [phone] Room [phone]). DC packet on chart. Ambulance transport requested for patient.   CSW signing off.  Cedric Fishman, LCSW Clinical Social Worker [phone]

## 2018-04-19 NOTE — Plan of Care (Signed)
  Problem: Education: Goal: Knowledge of General Education information will improve Description Including pain rating scale, medication(s)/side effects and non-pharmacologic comfort measures Outcome: Progressing Goal: Knowledge of General Education information will improve Description Including pain rating scale, medication(s)/side effects and non-pharmacologic comfort measures Outcome: Progressing   Problem: Health Behavior/Discharge Planning: Goal: Ability to manage health-related needs will improve Outcome: Progressing   Problem: Clinical Measurements: Goal: Ability to maintain clinical measurements within normal limits will improve Outcome: Progressing Goal: Will remain free from infection Outcome: Progressing Goal: Diagnostic test results will improve Outcome: Progressing Goal: Respiratory complications will improve Outcome: Progressing Goal: Cardiovascular complication will be avoided Outcome: Progressing   Problem: Activity: Goal: Risk for activity intolerance will decrease Outcome: Progressing   Problem: Nutrition: Goal: Adequate nutrition will be maintained Outcome: Progressing   Problem: Coping: Goal: Level of anxiety will decrease Outcome: Progressing   Problem: Elimination: Goal: Will not experience complications related to bowel motility Outcome: Progressing Goal: Will not experience complications related to urinary retention Outcome: Progressing   Problem: Pain Managment: Goal: General experience of comfort will improve Outcome: Progressing   Problem: Safety: Goal: Ability to remain free from injury will improve Outcome: Progressing   Problem: Skin Integrity: Goal: Risk for impaired skin integrity will decrease Outcome: Progressing

## 2018-04-20 ENCOUNTER — Telehealth (INDEPENDENT_AMBULATORY_CARE_PROVIDER_SITE_OTHER): Payer: Self-pay

## 2018-04-20 DIAGNOSIS — E1122 Type 2 diabetes mellitus with diabetic chronic kidney disease: Secondary | ICD-10-CM | POA: Diagnosis not present

## 2018-04-20 DIAGNOSIS — G2581 Restless legs syndrome: Secondary | ICD-10-CM | POA: Diagnosis not present

## 2018-04-20 DIAGNOSIS — I10 Essential (primary) hypertension: Secondary | ICD-10-CM | POA: Diagnosis not present

## 2018-04-20 DIAGNOSIS — M86271 Subacute osteomyelitis, right ankle and foot: Secondary | ICD-10-CM | POA: Diagnosis not present

## 2018-04-20 NOTE — Telephone Encounter (Signed)
Received a call stating that patient's wound vac has stopped working and wanted to know if wound vac could be taken off and have a [employer] wound vac put on.  Talked with Autumn and she stated to advise them to remove wound vac and apply a dry dressing and change dressing daily.

## 2018-04-20 NOTE — Anesthesia Postprocedure Evaluation (Signed)
Anesthesia Post Note  Patient: Sara Griffith  Procedure(s) Performed: RIGHT BELOW KNEE AMPUTATION (Right )     Patient location during evaluation: PACU Anesthesia Type: General Level of consciousness: sedated and awake Pain management: pain level controlled Vital Signs Assessment: post-procedure vital signs reviewed and stable Respiratory status: spontaneous breathing Cardiovascular status: stable Postop Assessment: no apparent nausea or vomiting Anesthetic complications: no    Last Vitals:  Vitals:   04/19/18 1439 04/19/18 2154  BP: (!) 132/59 (!) 117/54  Pulse: 75 66  Resp: 19 20  Temp: 36.7 C 37.4 C  SpO2: 100% 97%    Last Pain:  Vitals:   04/19/18 2154  TempSrc: Oral  PainSc:    Pain Goal: Patients Stated Pain Goal: 0 (04/18/18 2226)               Huber Mathers JR,JOHN Mateo Flow

## 2018-04-21 DIAGNOSIS — L97529 Non-pressure chronic ulcer of other part of left foot with unspecified severity: Secondary | ICD-10-CM | POA: Diagnosis not present

## 2018-04-23 DIAGNOSIS — E46 Unspecified protein-calorie malnutrition: Secondary | ICD-10-CM | POA: Diagnosis not present

## 2018-04-23 DIAGNOSIS — L859 Epidermal thickening, unspecified: Secondary | ICD-10-CM | POA: Diagnosis not present

## 2018-04-23 DIAGNOSIS — E1122 Type 2 diabetes mellitus with diabetic chronic kidney disease: Secondary | ICD-10-CM | POA: Diagnosis not present

## 2018-04-23 DIAGNOSIS — N183 Chronic kidney disease, stage 3 (moderate): Secondary | ICD-10-CM | POA: Diagnosis not present

## 2018-04-23 DIAGNOSIS — I10 Essential (primary) hypertension: Secondary | ICD-10-CM | POA: Diagnosis not present

## 2018-04-23 DIAGNOSIS — Z89511 Acquired absence of right leg below knee: Secondary | ICD-10-CM | POA: Diagnosis not present

## 2018-04-23 DIAGNOSIS — G2581 Restless legs syndrome: Secondary | ICD-10-CM | POA: Diagnosis not present

## 2018-04-23 DIAGNOSIS — I739 Peripheral vascular disease, unspecified: Secondary | ICD-10-CM | POA: Diagnosis not present

## 2018-04-28 DIAGNOSIS — L97529 Non-pressure chronic ulcer of other part of left foot with unspecified severity: Secondary | ICD-10-CM | POA: Diagnosis not present

## 2018-04-28 DIAGNOSIS — E1122 Type 2 diabetes mellitus with diabetic chronic kidney disease: Secondary | ICD-10-CM | POA: Diagnosis not present

## 2018-04-28 DIAGNOSIS — M86271 Subacute osteomyelitis, right ankle and foot: Secondary | ICD-10-CM | POA: Diagnosis not present

## 2018-04-28 DIAGNOSIS — I629 Nontraumatic intracranial hemorrhage, unspecified: Secondary | ICD-10-CM | POA: Diagnosis not present

## 2018-04-28 DIAGNOSIS — I10 Essential (primary) hypertension: Secondary | ICD-10-CM | POA: Diagnosis not present

## 2018-04-29 ENCOUNTER — Ambulatory Visit (INDEPENDENT_AMBULATORY_CARE_PROVIDER_SITE_OTHER): Payer: Medicare HMO | Admitting: Physician Assistant

## 2018-04-29 ENCOUNTER — Encounter (INDEPENDENT_AMBULATORY_CARE_PROVIDER_SITE_OTHER): Payer: Self-pay | Admitting: Orthopedic Surgery

## 2018-04-29 DIAGNOSIS — Z89511 Acquired absence of right leg below knee: Secondary | ICD-10-CM

## 2018-04-30 ENCOUNTER — Encounter (INDEPENDENT_AMBULATORY_CARE_PROVIDER_SITE_OTHER): Payer: Self-pay | Admitting: Physician Assistant

## 2018-04-30 NOTE — Progress Notes (Signed)
Office Visit Note   Patient: Sara Griffith           Date of Birth: [birth year]           MRN: [id number] Visit Date: 04/29/2018              Requested by: Kathyrn Lass, [place], [place] [address] PCP: Kathyrn Lass, MD   Assessment & Plan: Visit Diagnoses:  1. Acquired absence of right lower extremity below knee ([place])     Plan: Staples were harvested.  She will continue to wear stump shrinker stocking around-the-clock.  They can fold down the upper portion of the shrinker to avoid blisters which are over the proximal right knee and thigh.  She can get the transtibial irritation wet in the shower and wash with soap and water daily and then reapply the stump shrinker.  She can wear her stump protector when up with her walker but does not otherwise need to wear it.  She will follow-up in 4 weeks.  Follow-Up Instructions: No follow-ups on file.   Orders:  No orders of the defined types were placed in this encounter.  No orders of the defined types were placed in this encounter.     Procedures: No procedures performed   Clinical Data: No additional findings.   Subjective: Chief Complaint  Patient presents with  . Right Foot - Routine Post Op    HPI Patient is a 82 year old female who presents for postoperative follow-up following a right transtibial amputation on 04/16/2018.  She is 2 weeks postop.  She reports she has been doing well.  She is had minimal to no pain.  She is currently at [facility] skilled nursing facility. Review of Systems   Objective: Vital Signs: LMP  (LMP Unknown)   Physical Exam Patient presents at the wheelchair level.  She is alert oriented and appropriate. Ortho Exam Examination of the right transtibial amputation shows it to be healing well.  There is minimal edema.  Staples are intact but removed this visit.  There is no cellulitis.  She has full extension at the knee and excellent flexion as well. Specialty Comments:    No specialty comments available.  Imaging: No results found.   PMFS History: Patient Active Problem List   Diagnosis Date Noted  . Osteomyelitis of right foot ([place])   . Cellulitis 04/12/2018  . Skin ulcer ([place]) 04/12/2018  . Amputated great toe of right foot ([place]) 08/18/2017  . Idiopathic chronic venous hypertension of right lower extremity with ulcer and inflammation ([place]) 08/18/2017  . Type II diabetes mellitus with renal manifestations ([place]) 08/01/2017  . Fall 08/01/2017  . Left groin pain 08/01/2017  . Rhabdomyolysis 08/01/2017  . History of intracranial hemorrhage 08/01/2017  . Acute renal failure superimposed on stage 3 chronic kidney disease ([place]) 08/01/2017  . Fall at home, initial encounter   . Chest pressure 10/21/2016  . Chest pain at rest   . Hypertensive emergency   . Type 2 diabetes mellitus without complication, without long-term current use of insulin ([place])   . RLS (restless legs syndrome) 07/01/2016  . Cervical lymphadenitis 07/01/2016  . GERD (gastroesophageal reflux disease) 07/01/2016  . HLD (hyperlipidemia) 07/01/2016  . HTN (hypertension) 07/01/2016  . Lymphadenitis 07/01/2016   Past Medical History:  Diagnosis Date  . Chronic osteomyelitis of toe, right ([place])   . CKD (chronic kidney disease)   . Diabetes mellitus without complication ([place])   . GERD (gastroesophageal reflux disease)   .  HLD (hyperlipidemia)   . Hypertension   . IDA (iron deficiency anemia)   . PVD (peripheral vascular disease) ([place])   . RLS (restless legs syndrome)   . TIA (transient ischemic attack)   . Uses walker   . Vitamin D deficiency     Family History  Problem Relation Age of Onset  . Hypertension Mother   . Cancer Father   . Arrhythmia Sister        ppm  . Arrhythmia Brother        ppm    Past Surgical History:  Procedure Laterality Date  . ABDOMINAL HYSTERECTOMY     with bladder tac  . AMPUTATION Right 08/07/2017   Procedure: RIGHT GREAT TOE AMPUTATION AT  METATARSOPHALANGEAL JOINT;  Surgeon: Newt Minion, MD;  Location: [facility];  Service: Orthopedics;  Laterality: Right;  . AMPUTATION Right 04/16/2018   Procedure: RIGHT BELOW KNEE AMPUTATION;  Surgeon: Newt Minion, MD;  Location: [facility];  Service: Orthopedics;  Laterality: Right;  . APPENDECTOMY    . CARPAL TUNNEL RELEASE Right   . CATARACT EXTRACTION, BILATERAL    . CERVICAL DISC SURGERY    . COLONOSCOPY    . DILATION AND CURETTAGE OF UTERUS     x 2  . left great toe amputation    . ROTATOR CUFF REPAIR Left    Social History   Occupational History  . Not on file  Tobacco Use  . Smoking status: Former Smoker    Years: 30.00    Types: Cigarettes  . Smokeless tobacco: Former Systems developer    Types: Snuff    Quit date: 07/01/1954  Substance and Sexual Activity  . Alcohol use: No  . Drug use: No  . Sexual activity: Not on file

## 2018-05-04 DIAGNOSIS — S98111A Complete traumatic amputation of right great toe, initial encounter: Secondary | ICD-10-CM | POA: Diagnosis not present

## 2018-05-04 DIAGNOSIS — E1129 Type 2 diabetes mellitus with other diabetic kidney complication: Secondary | ICD-10-CM | POA: Diagnosis not present

## 2018-05-04 DIAGNOSIS — M86271 Subacute osteomyelitis, right ankle and foot: Secondary | ICD-10-CM | POA: Diagnosis not present

## 2018-05-04 DIAGNOSIS — M6281 Muscle weakness (generalized): Secondary | ICD-10-CM | POA: Diagnosis not present

## 2018-05-04 DIAGNOSIS — D509 Iron deficiency anemia, unspecified: Secondary | ICD-10-CM | POA: Diagnosis not present

## 2018-05-04 DIAGNOSIS — I10 Essential (primary) hypertension: Secondary | ICD-10-CM | POA: Diagnosis not present

## 2018-05-04 DIAGNOSIS — R21 Rash and other nonspecific skin eruption: Secondary | ICD-10-CM | POA: Diagnosis not present

## 2018-05-04 DIAGNOSIS — L97528 Non-pressure chronic ulcer of other part of left foot with other specified severity: Secondary | ICD-10-CM | POA: Diagnosis not present

## 2018-05-04 DIAGNOSIS — R2689 Other abnormalities of gait and mobility: Secondary | ICD-10-CM | POA: Diagnosis not present

## 2018-05-04 DIAGNOSIS — R278 Other lack of coordination: Secondary | ICD-10-CM | POA: Diagnosis not present

## 2018-05-04 DIAGNOSIS — E785 Hyperlipidemia, unspecified: Secondary | ICD-10-CM | POA: Diagnosis not present

## 2018-05-04 DIAGNOSIS — E1122 Type 2 diabetes mellitus with diabetic chronic kidney disease: Secondary | ICD-10-CM | POA: Diagnosis not present

## 2018-05-04 DIAGNOSIS — K219 Gastro-esophageal reflux disease without esophagitis: Secondary | ICD-10-CM | POA: Diagnosis not present

## 2018-05-05 DIAGNOSIS — R278 Other lack of coordination: Secondary | ICD-10-CM | POA: Diagnosis not present

## 2018-05-05 DIAGNOSIS — D509 Iron deficiency anemia, unspecified: Secondary | ICD-10-CM | POA: Diagnosis not present

## 2018-05-05 DIAGNOSIS — S98111A Complete traumatic amputation of right great toe, initial encounter: Secondary | ICD-10-CM | POA: Diagnosis not present

## 2018-05-05 DIAGNOSIS — E785 Hyperlipidemia, unspecified: Secondary | ICD-10-CM | POA: Diagnosis not present

## 2018-05-05 DIAGNOSIS — E1129 Type 2 diabetes mellitus with other diabetic kidney complication: Secondary | ICD-10-CM | POA: Diagnosis not present

## 2018-05-05 DIAGNOSIS — L97529 Non-pressure chronic ulcer of other part of left foot with unspecified severity: Secondary | ICD-10-CM | POA: Diagnosis not present

## 2018-05-05 DIAGNOSIS — R2689 Other abnormalities of gait and mobility: Secondary | ICD-10-CM | POA: Diagnosis not present

## 2018-05-05 DIAGNOSIS — I10 Essential (primary) hypertension: Secondary | ICD-10-CM | POA: Diagnosis not present

## 2018-05-05 DIAGNOSIS — M6281 Muscle weakness (generalized): Secondary | ICD-10-CM | POA: Diagnosis not present

## 2018-05-05 DIAGNOSIS — K219 Gastro-esophageal reflux disease without esophagitis: Secondary | ICD-10-CM | POA: Diagnosis not present

## 2018-05-06 DIAGNOSIS — D509 Iron deficiency anemia, unspecified: Secondary | ICD-10-CM | POA: Diagnosis not present

## 2018-05-06 DIAGNOSIS — K219 Gastro-esophageal reflux disease without esophagitis: Secondary | ICD-10-CM | POA: Diagnosis not present

## 2018-05-06 DIAGNOSIS — S98111A Complete traumatic amputation of right great toe, initial encounter: Secondary | ICD-10-CM | POA: Diagnosis not present

## 2018-05-06 DIAGNOSIS — R2689 Other abnormalities of gait and mobility: Secondary | ICD-10-CM | POA: Diagnosis not present

## 2018-05-06 DIAGNOSIS — I10 Essential (primary) hypertension: Secondary | ICD-10-CM | POA: Diagnosis not present

## 2018-05-06 DIAGNOSIS — M6281 Muscle weakness (generalized): Secondary | ICD-10-CM | POA: Diagnosis not present

## 2018-05-06 DIAGNOSIS — E1129 Type 2 diabetes mellitus with other diabetic kidney complication: Secondary | ICD-10-CM | POA: Diagnosis not present

## 2018-05-06 DIAGNOSIS — E785 Hyperlipidemia, unspecified: Secondary | ICD-10-CM | POA: Diagnosis not present

## 2018-05-06 DIAGNOSIS — R278 Other lack of coordination: Secondary | ICD-10-CM | POA: Diagnosis not present

## 2018-05-07 DIAGNOSIS — K219 Gastro-esophageal reflux disease without esophagitis: Secondary | ICD-10-CM | POA: Diagnosis not present

## 2018-05-07 DIAGNOSIS — M6281 Muscle weakness (generalized): Secondary | ICD-10-CM | POA: Diagnosis not present

## 2018-05-07 DIAGNOSIS — E785 Hyperlipidemia, unspecified: Secondary | ICD-10-CM | POA: Diagnosis not present

## 2018-05-07 DIAGNOSIS — R278 Other lack of coordination: Secondary | ICD-10-CM | POA: Diagnosis not present

## 2018-05-07 DIAGNOSIS — I10 Essential (primary) hypertension: Secondary | ICD-10-CM | POA: Diagnosis not present

## 2018-05-07 DIAGNOSIS — S98111A Complete traumatic amputation of right great toe, initial encounter: Secondary | ICD-10-CM | POA: Diagnosis not present

## 2018-05-07 DIAGNOSIS — D509 Iron deficiency anemia, unspecified: Secondary | ICD-10-CM | POA: Diagnosis not present

## 2018-05-07 DIAGNOSIS — R2689 Other abnormalities of gait and mobility: Secondary | ICD-10-CM | POA: Diagnosis not present

## 2018-05-07 DIAGNOSIS — E1129 Type 2 diabetes mellitus with other diabetic kidney complication: Secondary | ICD-10-CM | POA: Diagnosis not present

## 2018-05-10 DIAGNOSIS — K219 Gastro-esophageal reflux disease without esophagitis: Secondary | ICD-10-CM | POA: Diagnosis not present

## 2018-05-10 DIAGNOSIS — R278 Other lack of coordination: Secondary | ICD-10-CM | POA: Diagnosis not present

## 2018-05-10 DIAGNOSIS — S98111A Complete traumatic amputation of right great toe, initial encounter: Secondary | ICD-10-CM | POA: Diagnosis not present

## 2018-05-10 DIAGNOSIS — E1129 Type 2 diabetes mellitus with other diabetic kidney complication: Secondary | ICD-10-CM | POA: Diagnosis not present

## 2018-05-10 DIAGNOSIS — D509 Iron deficiency anemia, unspecified: Secondary | ICD-10-CM | POA: Diagnosis not present

## 2018-05-10 DIAGNOSIS — M6281 Muscle weakness (generalized): Secondary | ICD-10-CM | POA: Diagnosis not present

## 2018-05-10 DIAGNOSIS — R2689 Other abnormalities of gait and mobility: Secondary | ICD-10-CM | POA: Diagnosis not present

## 2018-05-10 DIAGNOSIS — E785 Hyperlipidemia, unspecified: Secondary | ICD-10-CM | POA: Diagnosis not present

## 2018-05-10 DIAGNOSIS — I10 Essential (primary) hypertension: Secondary | ICD-10-CM | POA: Diagnosis not present

## 2018-05-11 DIAGNOSIS — R2689 Other abnormalities of gait and mobility: Secondary | ICD-10-CM | POA: Diagnosis not present

## 2018-05-11 DIAGNOSIS — R278 Other lack of coordination: Secondary | ICD-10-CM | POA: Diagnosis not present

## 2018-05-11 DIAGNOSIS — D509 Iron deficiency anemia, unspecified: Secondary | ICD-10-CM | POA: Diagnosis not present

## 2018-05-11 DIAGNOSIS — S98111A Complete traumatic amputation of right great toe, initial encounter: Secondary | ICD-10-CM | POA: Diagnosis not present

## 2018-05-11 DIAGNOSIS — K219 Gastro-esophageal reflux disease without esophagitis: Secondary | ICD-10-CM | POA: Diagnosis not present

## 2018-05-11 DIAGNOSIS — E785 Hyperlipidemia, unspecified: Secondary | ICD-10-CM | POA: Diagnosis not present

## 2018-05-11 DIAGNOSIS — E1129 Type 2 diabetes mellitus with other diabetic kidney complication: Secondary | ICD-10-CM | POA: Diagnosis not present

## 2018-05-11 DIAGNOSIS — M6281 Muscle weakness (generalized): Secondary | ICD-10-CM | POA: Diagnosis not present

## 2018-05-11 DIAGNOSIS — I10 Essential (primary) hypertension: Secondary | ICD-10-CM | POA: Diagnosis not present

## 2018-05-12 DIAGNOSIS — R2689 Other abnormalities of gait and mobility: Secondary | ICD-10-CM | POA: Diagnosis not present

## 2018-05-12 DIAGNOSIS — D509 Iron deficiency anemia, unspecified: Secondary | ICD-10-CM | POA: Diagnosis not present

## 2018-05-12 DIAGNOSIS — R278 Other lack of coordination: Secondary | ICD-10-CM | POA: Diagnosis not present

## 2018-05-12 DIAGNOSIS — I10 Essential (primary) hypertension: Secondary | ICD-10-CM | POA: Diagnosis not present

## 2018-05-12 DIAGNOSIS — K219 Gastro-esophageal reflux disease without esophagitis: Secondary | ICD-10-CM | POA: Diagnosis not present

## 2018-05-12 DIAGNOSIS — M6281 Muscle weakness (generalized): Secondary | ICD-10-CM | POA: Diagnosis not present

## 2018-05-12 DIAGNOSIS — E785 Hyperlipidemia, unspecified: Secondary | ICD-10-CM | POA: Diagnosis not present

## 2018-05-12 DIAGNOSIS — S98111A Complete traumatic amputation of right great toe, initial encounter: Secondary | ICD-10-CM | POA: Diagnosis not present

## 2018-05-12 DIAGNOSIS — E1129 Type 2 diabetes mellitus with other diabetic kidney complication: Secondary | ICD-10-CM | POA: Diagnosis not present

## 2018-05-12 DIAGNOSIS — L97529 Non-pressure chronic ulcer of other part of left foot with unspecified severity: Secondary | ICD-10-CM | POA: Diagnosis not present

## 2018-05-13 DIAGNOSIS — R278 Other lack of coordination: Secondary | ICD-10-CM | POA: Diagnosis not present

## 2018-05-13 DIAGNOSIS — D509 Iron deficiency anemia, unspecified: Secondary | ICD-10-CM | POA: Diagnosis not present

## 2018-05-13 DIAGNOSIS — S98111A Complete traumatic amputation of right great toe, initial encounter: Secondary | ICD-10-CM | POA: Diagnosis not present

## 2018-05-13 DIAGNOSIS — R2689 Other abnormalities of gait and mobility: Secondary | ICD-10-CM | POA: Diagnosis not present

## 2018-05-13 DIAGNOSIS — E1129 Type 2 diabetes mellitus with other diabetic kidney complication: Secondary | ICD-10-CM | POA: Diagnosis not present

## 2018-05-13 DIAGNOSIS — K219 Gastro-esophageal reflux disease without esophagitis: Secondary | ICD-10-CM | POA: Diagnosis not present

## 2018-05-13 DIAGNOSIS — M6281 Muscle weakness (generalized): Secondary | ICD-10-CM | POA: Diagnosis not present

## 2018-05-13 DIAGNOSIS — I10 Essential (primary) hypertension: Secondary | ICD-10-CM | POA: Diagnosis not present

## 2018-05-13 DIAGNOSIS — E785 Hyperlipidemia, unspecified: Secondary | ICD-10-CM | POA: Diagnosis not present

## 2018-05-14 DIAGNOSIS — M6281 Muscle weakness (generalized): Secondary | ICD-10-CM | POA: Diagnosis not present

## 2018-05-14 DIAGNOSIS — R278 Other lack of coordination: Secondary | ICD-10-CM | POA: Diagnosis not present

## 2018-05-14 DIAGNOSIS — R2689 Other abnormalities of gait and mobility: Secondary | ICD-10-CM | POA: Diagnosis not present

## 2018-05-14 DIAGNOSIS — K219 Gastro-esophageal reflux disease without esophagitis: Secondary | ICD-10-CM | POA: Diagnosis not present

## 2018-05-14 DIAGNOSIS — S98111A Complete traumatic amputation of right great toe, initial encounter: Secondary | ICD-10-CM | POA: Diagnosis not present

## 2018-05-14 DIAGNOSIS — I10 Essential (primary) hypertension: Secondary | ICD-10-CM | POA: Diagnosis not present

## 2018-05-14 DIAGNOSIS — E785 Hyperlipidemia, unspecified: Secondary | ICD-10-CM | POA: Diagnosis not present

## 2018-05-14 DIAGNOSIS — E1129 Type 2 diabetes mellitus with other diabetic kidney complication: Secondary | ICD-10-CM | POA: Diagnosis not present

## 2018-05-14 DIAGNOSIS — D509 Iron deficiency anemia, unspecified: Secondary | ICD-10-CM | POA: Diagnosis not present

## 2018-05-17 DIAGNOSIS — D509 Iron deficiency anemia, unspecified: Secondary | ICD-10-CM | POA: Diagnosis not present

## 2018-05-17 DIAGNOSIS — R278 Other lack of coordination: Secondary | ICD-10-CM | POA: Diagnosis not present

## 2018-05-17 DIAGNOSIS — E785 Hyperlipidemia, unspecified: Secondary | ICD-10-CM | POA: Diagnosis not present

## 2018-05-17 DIAGNOSIS — E1129 Type 2 diabetes mellitus with other diabetic kidney complication: Secondary | ICD-10-CM | POA: Diagnosis not present

## 2018-05-17 DIAGNOSIS — I10 Essential (primary) hypertension: Secondary | ICD-10-CM | POA: Diagnosis not present

## 2018-05-17 DIAGNOSIS — M6281 Muscle weakness (generalized): Secondary | ICD-10-CM | POA: Diagnosis not present

## 2018-05-17 DIAGNOSIS — S98111A Complete traumatic amputation of right great toe, initial encounter: Secondary | ICD-10-CM | POA: Diagnosis not present

## 2018-05-17 DIAGNOSIS — R2689 Other abnormalities of gait and mobility: Secondary | ICD-10-CM | POA: Diagnosis not present

## 2018-05-17 DIAGNOSIS — K219 Gastro-esophageal reflux disease without esophagitis: Secondary | ICD-10-CM | POA: Diagnosis not present

## 2018-05-18 DIAGNOSIS — S98111A Complete traumatic amputation of right great toe, initial encounter: Secondary | ICD-10-CM | POA: Diagnosis not present

## 2018-05-18 DIAGNOSIS — K219 Gastro-esophageal reflux disease without esophagitis: Secondary | ICD-10-CM | POA: Diagnosis not present

## 2018-05-18 DIAGNOSIS — R278 Other lack of coordination: Secondary | ICD-10-CM | POA: Diagnosis not present

## 2018-05-18 DIAGNOSIS — E1129 Type 2 diabetes mellitus with other diabetic kidney complication: Secondary | ICD-10-CM | POA: Diagnosis not present

## 2018-05-18 DIAGNOSIS — R2689 Other abnormalities of gait and mobility: Secondary | ICD-10-CM | POA: Diagnosis not present

## 2018-05-18 DIAGNOSIS — M6281 Muscle weakness (generalized): Secondary | ICD-10-CM | POA: Diagnosis not present

## 2018-05-18 DIAGNOSIS — E785 Hyperlipidemia, unspecified: Secondary | ICD-10-CM | POA: Diagnosis not present

## 2018-05-18 DIAGNOSIS — I10 Essential (primary) hypertension: Secondary | ICD-10-CM | POA: Diagnosis not present

## 2018-05-18 DIAGNOSIS — D509 Iron deficiency anemia, unspecified: Secondary | ICD-10-CM | POA: Diagnosis not present

## 2018-05-19 DIAGNOSIS — D509 Iron deficiency anemia, unspecified: Secondary | ICD-10-CM | POA: Diagnosis not present

## 2018-05-19 DIAGNOSIS — L97529 Non-pressure chronic ulcer of other part of left foot with unspecified severity: Secondary | ICD-10-CM | POA: Diagnosis not present

## 2018-05-19 DIAGNOSIS — S98111A Complete traumatic amputation of right great toe, initial encounter: Secondary | ICD-10-CM | POA: Diagnosis not present

## 2018-05-19 DIAGNOSIS — E785 Hyperlipidemia, unspecified: Secondary | ICD-10-CM | POA: Diagnosis not present

## 2018-05-19 DIAGNOSIS — E1129 Type 2 diabetes mellitus with other diabetic kidney complication: Secondary | ICD-10-CM | POA: Diagnosis not present

## 2018-05-19 DIAGNOSIS — I10 Essential (primary) hypertension: Secondary | ICD-10-CM | POA: Diagnosis not present

## 2018-05-19 DIAGNOSIS — R278 Other lack of coordination: Secondary | ICD-10-CM | POA: Diagnosis not present

## 2018-05-19 DIAGNOSIS — K219 Gastro-esophageal reflux disease without esophagitis: Secondary | ICD-10-CM | POA: Diagnosis not present

## 2018-05-19 DIAGNOSIS — R2689 Other abnormalities of gait and mobility: Secondary | ICD-10-CM | POA: Diagnosis not present

## 2018-05-19 DIAGNOSIS — M6281 Muscle weakness (generalized): Secondary | ICD-10-CM | POA: Diagnosis not present

## 2018-05-20 DIAGNOSIS — M6281 Muscle weakness (generalized): Secondary | ICD-10-CM | POA: Diagnosis not present

## 2018-05-20 DIAGNOSIS — D509 Iron deficiency anemia, unspecified: Secondary | ICD-10-CM | POA: Diagnosis not present

## 2018-05-20 DIAGNOSIS — R278 Other lack of coordination: Secondary | ICD-10-CM | POA: Diagnosis not present

## 2018-05-20 DIAGNOSIS — S98111A Complete traumatic amputation of right great toe, initial encounter: Secondary | ICD-10-CM | POA: Diagnosis not present

## 2018-05-20 DIAGNOSIS — E1129 Type 2 diabetes mellitus with other diabetic kidney complication: Secondary | ICD-10-CM | POA: Diagnosis not present

## 2018-05-20 DIAGNOSIS — I10 Essential (primary) hypertension: Secondary | ICD-10-CM | POA: Diagnosis not present

## 2018-05-20 DIAGNOSIS — R2689 Other abnormalities of gait and mobility: Secondary | ICD-10-CM | POA: Diagnosis not present

## 2018-05-20 DIAGNOSIS — E785 Hyperlipidemia, unspecified: Secondary | ICD-10-CM | POA: Diagnosis not present

## 2018-05-20 DIAGNOSIS — K219 Gastro-esophageal reflux disease without esophagitis: Secondary | ICD-10-CM | POA: Diagnosis not present

## 2018-05-21 DIAGNOSIS — I739 Peripheral vascular disease, unspecified: Secondary | ICD-10-CM | POA: Diagnosis not present

## 2018-05-21 DIAGNOSIS — F0390 Unspecified dementia without behavioral disturbance: Secondary | ICD-10-CM | POA: Diagnosis not present

## 2018-05-21 DIAGNOSIS — E785 Hyperlipidemia, unspecified: Secondary | ICD-10-CM | POA: Diagnosis not present

## 2018-05-21 DIAGNOSIS — K219 Gastro-esophageal reflux disease without esophagitis: Secondary | ICD-10-CM | POA: Diagnosis not present

## 2018-05-21 DIAGNOSIS — R278 Other lack of coordination: Secondary | ICD-10-CM | POA: Diagnosis not present

## 2018-05-21 DIAGNOSIS — S98111A Complete traumatic amputation of right great toe, initial encounter: Secondary | ICD-10-CM | POA: Diagnosis not present

## 2018-05-21 DIAGNOSIS — M6281 Muscle weakness (generalized): Secondary | ICD-10-CM | POA: Diagnosis not present

## 2018-05-21 DIAGNOSIS — D509 Iron deficiency anemia, unspecified: Secondary | ICD-10-CM | POA: Diagnosis not present

## 2018-05-21 DIAGNOSIS — G2581 Restless legs syndrome: Secondary | ICD-10-CM | POA: Diagnosis not present

## 2018-05-21 DIAGNOSIS — R2689 Other abnormalities of gait and mobility: Secondary | ICD-10-CM | POA: Diagnosis not present

## 2018-05-21 DIAGNOSIS — I10 Essential (primary) hypertension: Secondary | ICD-10-CM | POA: Diagnosis not present

## 2018-05-21 DIAGNOSIS — Z89511 Acquired absence of right leg below knee: Secondary | ICD-10-CM | POA: Diagnosis not present

## 2018-05-21 DIAGNOSIS — N183 Chronic kidney disease, stage 3 (moderate): Secondary | ICD-10-CM | POA: Diagnosis not present

## 2018-05-21 DIAGNOSIS — E1122 Type 2 diabetes mellitus with diabetic chronic kidney disease: Secondary | ICD-10-CM | POA: Diagnosis not present

## 2018-05-21 DIAGNOSIS — E1129 Type 2 diabetes mellitus with other diabetic kidney complication: Secondary | ICD-10-CM | POA: Diagnosis not present

## 2018-05-24 DIAGNOSIS — S98111A Complete traumatic amputation of right great toe, initial encounter: Secondary | ICD-10-CM | POA: Diagnosis not present

## 2018-05-24 DIAGNOSIS — D509 Iron deficiency anemia, unspecified: Secondary | ICD-10-CM | POA: Diagnosis not present

## 2018-05-24 DIAGNOSIS — R2689 Other abnormalities of gait and mobility: Secondary | ICD-10-CM | POA: Diagnosis not present

## 2018-05-24 DIAGNOSIS — R278 Other lack of coordination: Secondary | ICD-10-CM | POA: Diagnosis not present

## 2018-05-24 DIAGNOSIS — E785 Hyperlipidemia, unspecified: Secondary | ICD-10-CM | POA: Diagnosis not present

## 2018-05-24 DIAGNOSIS — E1129 Type 2 diabetes mellitus with other diabetic kidney complication: Secondary | ICD-10-CM | POA: Diagnosis not present

## 2018-05-24 DIAGNOSIS — K219 Gastro-esophageal reflux disease without esophagitis: Secondary | ICD-10-CM | POA: Diagnosis not present

## 2018-05-24 DIAGNOSIS — I10 Essential (primary) hypertension: Secondary | ICD-10-CM | POA: Diagnosis not present

## 2018-05-24 DIAGNOSIS — M6281 Muscle weakness (generalized): Secondary | ICD-10-CM | POA: Diagnosis not present

## 2018-05-25 DIAGNOSIS — M6281 Muscle weakness (generalized): Secondary | ICD-10-CM | POA: Diagnosis not present

## 2018-05-25 DIAGNOSIS — S98111A Complete traumatic amputation of right great toe, initial encounter: Secondary | ICD-10-CM | POA: Diagnosis not present

## 2018-05-25 DIAGNOSIS — E785 Hyperlipidemia, unspecified: Secondary | ICD-10-CM | POA: Diagnosis not present

## 2018-05-25 DIAGNOSIS — R278 Other lack of coordination: Secondary | ICD-10-CM | POA: Diagnosis not present

## 2018-05-25 DIAGNOSIS — K219 Gastro-esophageal reflux disease without esophagitis: Secondary | ICD-10-CM | POA: Diagnosis not present

## 2018-05-25 DIAGNOSIS — L97529 Non-pressure chronic ulcer of other part of left foot with unspecified severity: Secondary | ICD-10-CM | POA: Diagnosis not present

## 2018-05-25 DIAGNOSIS — I10 Essential (primary) hypertension: Secondary | ICD-10-CM | POA: Diagnosis not present

## 2018-05-25 DIAGNOSIS — E1129 Type 2 diabetes mellitus with other diabetic kidney complication: Secondary | ICD-10-CM | POA: Diagnosis not present

## 2018-05-25 DIAGNOSIS — R2689 Other abnormalities of gait and mobility: Secondary | ICD-10-CM | POA: Diagnosis not present

## 2018-05-25 DIAGNOSIS — D509 Iron deficiency anemia, unspecified: Secondary | ICD-10-CM | POA: Diagnosis not present

## 2018-05-26 DIAGNOSIS — K219 Gastro-esophageal reflux disease without esophagitis: Secondary | ICD-10-CM | POA: Diagnosis not present

## 2018-05-26 DIAGNOSIS — D509 Iron deficiency anemia, unspecified: Secondary | ICD-10-CM | POA: Diagnosis not present

## 2018-05-26 DIAGNOSIS — E1129 Type 2 diabetes mellitus with other diabetic kidney complication: Secondary | ICD-10-CM | POA: Diagnosis not present

## 2018-05-26 DIAGNOSIS — R278 Other lack of coordination: Secondary | ICD-10-CM | POA: Diagnosis not present

## 2018-05-26 DIAGNOSIS — M6281 Muscle weakness (generalized): Secondary | ICD-10-CM | POA: Diagnosis not present

## 2018-05-26 DIAGNOSIS — E785 Hyperlipidemia, unspecified: Secondary | ICD-10-CM | POA: Diagnosis not present

## 2018-05-26 DIAGNOSIS — I10 Essential (primary) hypertension: Secondary | ICD-10-CM | POA: Diagnosis not present

## 2018-05-26 DIAGNOSIS — R2689 Other abnormalities of gait and mobility: Secondary | ICD-10-CM | POA: Diagnosis not present

## 2018-05-26 DIAGNOSIS — S98111A Complete traumatic amputation of right great toe, initial encounter: Secondary | ICD-10-CM | POA: Diagnosis not present

## 2018-05-27 ENCOUNTER — Encounter (INDEPENDENT_AMBULATORY_CARE_PROVIDER_SITE_OTHER): Payer: Self-pay | Admitting: Orthopedic Surgery

## 2018-05-27 ENCOUNTER — Ambulatory Visit (INDEPENDENT_AMBULATORY_CARE_PROVIDER_SITE_OTHER): Payer: Medicare HMO | Admitting: Physician Assistant

## 2018-05-27 VITALS — Ht 59.0 in | Wt 130.7 lb

## 2018-05-27 DIAGNOSIS — E1142 Type 2 diabetes mellitus with diabetic polyneuropathy: Secondary | ICD-10-CM

## 2018-05-27 DIAGNOSIS — R2689 Other abnormalities of gait and mobility: Secondary | ICD-10-CM | POA: Diagnosis not present

## 2018-05-27 DIAGNOSIS — L97919 Non-pressure chronic ulcer of unspecified part of right lower leg with unspecified severity: Secondary | ICD-10-CM

## 2018-05-27 DIAGNOSIS — I10 Essential (primary) hypertension: Secondary | ICD-10-CM | POA: Diagnosis not present

## 2018-05-27 DIAGNOSIS — Z89511 Acquired absence of right leg below knee: Secondary | ICD-10-CM

## 2018-05-27 DIAGNOSIS — I87331 Chronic venous hypertension (idiopathic) with ulcer and inflammation of right lower extremity: Secondary | ICD-10-CM

## 2018-05-27 DIAGNOSIS — S98111A Complete traumatic amputation of right great toe, initial encounter: Secondary | ICD-10-CM | POA: Diagnosis not present

## 2018-05-27 DIAGNOSIS — D509 Iron deficiency anemia, unspecified: Secondary | ICD-10-CM | POA: Diagnosis not present

## 2018-05-27 DIAGNOSIS — K219 Gastro-esophageal reflux disease without esophagitis: Secondary | ICD-10-CM | POA: Diagnosis not present

## 2018-05-27 DIAGNOSIS — E1129 Type 2 diabetes mellitus with other diabetic kidney complication: Secondary | ICD-10-CM | POA: Diagnosis not present

## 2018-05-27 DIAGNOSIS — R278 Other lack of coordination: Secondary | ICD-10-CM | POA: Diagnosis not present

## 2018-05-27 DIAGNOSIS — M6281 Muscle weakness (generalized): Secondary | ICD-10-CM | POA: Diagnosis not present

## 2018-05-27 DIAGNOSIS — E785 Hyperlipidemia, unspecified: Secondary | ICD-10-CM | POA: Diagnosis not present

## 2018-05-27 DIAGNOSIS — E43 Unspecified severe protein-calorie malnutrition: Secondary | ICD-10-CM

## 2018-05-28 ENCOUNTER — Encounter (INDEPENDENT_AMBULATORY_CARE_PROVIDER_SITE_OTHER): Payer: Self-pay | Admitting: Physician Assistant

## 2018-05-28 DIAGNOSIS — R2689 Other abnormalities of gait and mobility: Secondary | ICD-10-CM | POA: Diagnosis not present

## 2018-05-28 DIAGNOSIS — E785 Hyperlipidemia, unspecified: Secondary | ICD-10-CM | POA: Diagnosis not present

## 2018-05-28 DIAGNOSIS — E1129 Type 2 diabetes mellitus with other diabetic kidney complication: Secondary | ICD-10-CM | POA: Diagnosis not present

## 2018-05-28 DIAGNOSIS — M6281 Muscle weakness (generalized): Secondary | ICD-10-CM | POA: Diagnosis not present

## 2018-05-28 DIAGNOSIS — R278 Other lack of coordination: Secondary | ICD-10-CM | POA: Diagnosis not present

## 2018-05-28 DIAGNOSIS — D509 Iron deficiency anemia, unspecified: Secondary | ICD-10-CM | POA: Diagnosis not present

## 2018-05-28 DIAGNOSIS — K219 Gastro-esophageal reflux disease without esophagitis: Secondary | ICD-10-CM | POA: Diagnosis not present

## 2018-05-28 DIAGNOSIS — S98111A Complete traumatic amputation of right great toe, initial encounter: Secondary | ICD-10-CM | POA: Diagnosis not present

## 2018-05-28 DIAGNOSIS — I10 Essential (primary) hypertension: Secondary | ICD-10-CM | POA: Diagnosis not present

## 2018-05-28 NOTE — Progress Notes (Signed)
Office Visit Note   Patient: Sara Griffith           Date of Birth: [birth year]           MRN: [id number] Visit Date: 05/27/2018              Requested by: Kathyrn Lass, [place], [place] [address] PCP: Kathyrn Lass, MD  Chief Complaint  Patient presents with  . Right Leg - Follow-up, Routine Post Op    BKA      HPI: Patient is an 82 year old female who is seen for postoperative follow-up following a right transtibial amputation on 04/16/2018. She is approximately 6 weeks postop.  She is wearing her stump shrinkers.  She is working with [facility]otech for prosthetic evaluation.  She did develop a rash over her proximal thighs and reports that is itching some and we have recommended some hydrocortisone cream 0.1% to her rash daily after cleansing.  She does have some titer stump shrinkers and we instructed her how to roll these down to avoid irritation of the proximal thigh.   Assessment & Plan: Visit Diagnoses:  1. Acquired absence of right lower extremity below knee ([place])   2. Idiopathic chronic venous hypertension of right lower extremity with ulcer and inflammation (HCC)   3. Diabetic polyneuropathy associated with type 2 diabetes mellitus ([place])   4. Severe protein-calorie malnutrition ([place])     Plan: Hydrocortisone cream to the proximal thigh rash daily after cleansing.  She can resume wearing her stump shrinker stockings and we did instruct her how to roll these down to avoid irritation of the proximal thigh.  She does not need to use her stump protector any longer.  Continue to work with biotech on prosthetic evaluation.  Follow-up here in 4 weeks.  Follow-Up Instructions: Return in about 4 weeks (around 06/24/2018).   Ortho Exam  Patient is alert, oriented, no adenopathy, well-dressed, normal affect, normal respiratory effort. Patient's right transtibial amputation incision is clean dry and intact and well-healed.  There is minimal edema.  There is no signs of  cellulitis.  She does have some proximal thigh irritation/slightly raised rash without signs of cellulitis.  She has full knee extension and good flexion.  Imaging: No results found. No images are attached to the encounter.  Labs: Lab Results  Component Value Date   HGBA1C 7.4 (H) 08/02/2017   HGBA1C 8.0 (H) 10/21/2016   HGBA1C 7.9 (H) 07/03/2016   ESRSEDRATE 126 (H) 04/12/2018   REPTSTATUS 04/17/2018 FINAL 04/12/2018   CULT  04/12/2018    NO GROWTH 5 DAYS Performed at [facility], [place] [address], [place], [place] [address]      Lab Results  Component Value Date   ALBUMIN 1.8 (L) 04/12/2018   ALBUMIN 2.3 (L) 04/11/2018   ALBUMIN 2.4 (L) 08/03/2017    Body mass index is 26.4 kg/m.  Orders:  No orders of the defined types were placed in this encounter.  No orders of the defined types were placed in this encounter.    Procedures: No procedures performed  Clinical Data: No additional findings.  ROS:  All other systems negative, except as noted in the HPI. Review of Systems  Objective: Vital Signs: Ht 4\' 11"  (1.499 m)   Wt 130 lb 11.7 oz (59.3 kg)   LMP  (LMP Unknown)   BMI 26.40 kg/m   Specialty Comments:  No specialty comments available.  PMFS History: Patient Active Problem List   Diagnosis Date  Noted  . Osteomyelitis of right foot ([place])   . Cellulitis 04/12/2018  . Skin ulcer ([place]) 04/12/2018  . Amputated great toe of right foot ([place]) 08/18/2017  . Idiopathic chronic venous hypertension of right lower extremity with ulcer and inflammation ([place]) 08/18/2017  . Type II diabetes mellitus with renal manifestations ([place]) 08/01/2017  . Fall 08/01/2017  . Left groin pain 08/01/2017  . Rhabdomyolysis 08/01/2017  . History of intracranial hemorrhage 08/01/2017  . Acute renal failure superimposed on stage 3 chronic kidney disease ([place]) 08/01/2017  . Fall at home, initial encounter   . Chest pressure 10/21/2016  . Chest pain at rest   .  Hypertensive emergency   . Type 2 diabetes mellitus without complication, without long-term current use of insulin ([place])   . RLS (restless legs syndrome) 07/01/2016  . Cervical lymphadenitis 07/01/2016  . GERD (gastroesophageal reflux disease) 07/01/2016  . HLD (hyperlipidemia) 07/01/2016  . HTN (hypertension) 07/01/2016  . Lymphadenitis 07/01/2016   Past Medical History:  Diagnosis Date  . Chronic osteomyelitis of toe, right ([place])   . CKD (chronic kidney disease)   . Diabetes mellitus without complication ([place])   . GERD (gastroesophageal reflux disease)   . HLD (hyperlipidemia)   . Hypertension   . IDA (iron deficiency anemia)   . PVD (peripheral vascular disease) ([place])   . RLS (restless legs syndrome)   . TIA (transient ischemic attack)   . Uses walker   . Vitamin D deficiency     Family History  Problem Relation Age of Onset  . Hypertension Mother   . Cancer Father   . Arrhythmia Sister        ppm  . Arrhythmia Brother        ppm    Past Surgical History:  Procedure Laterality Date  . ABDOMINAL HYSTERECTOMY     with bladder tac  . AMPUTATION Right 08/07/2017   Procedure: RIGHT GREAT TOE AMPUTATION AT METATARSOPHALANGEAL JOINT;  Surgeon: Newt Minion, MD;  Location: [facility];  Service: Orthopedics;  Laterality: Right;  . AMPUTATION Right 04/16/2018   Procedure: RIGHT BELOW KNEE AMPUTATION;  Surgeon: Newt Minion, MD;  Location: [facility];  Service: Orthopedics;  Laterality: Right;  . APPENDECTOMY    . CARPAL TUNNEL RELEASE Right   . CATARACT EXTRACTION, BILATERAL    . CERVICAL DISC SURGERY    . COLONOSCOPY    . DILATION AND CURETTAGE OF UTERUS     x 2  . left great toe amputation    . ROTATOR CUFF REPAIR Left    Social History   Occupational History  . Not on file  Tobacco Use  . Smoking status: Former Smoker    Years: 30.00    Types: Cigarettes  . Smokeless tobacco: Former Systems developer    Types: Snuff    Quit date: 07/01/1954  Substance and Sexual Activity  .  Alcohol use: No  . Drug use: No  . Sexual activity: Not on file

## 2018-05-31 DIAGNOSIS — E1129 Type 2 diabetes mellitus with other diabetic kidney complication: Secondary | ICD-10-CM | POA: Diagnosis not present

## 2018-05-31 DIAGNOSIS — R278 Other lack of coordination: Secondary | ICD-10-CM | POA: Diagnosis not present

## 2018-05-31 DIAGNOSIS — K219 Gastro-esophageal reflux disease without esophagitis: Secondary | ICD-10-CM | POA: Diagnosis not present

## 2018-05-31 DIAGNOSIS — D509 Iron deficiency anemia, unspecified: Secondary | ICD-10-CM | POA: Diagnosis not present

## 2018-05-31 DIAGNOSIS — E785 Hyperlipidemia, unspecified: Secondary | ICD-10-CM | POA: Diagnosis not present

## 2018-05-31 DIAGNOSIS — M6281 Muscle weakness (generalized): Secondary | ICD-10-CM | POA: Diagnosis not present

## 2018-05-31 DIAGNOSIS — R2689 Other abnormalities of gait and mobility: Secondary | ICD-10-CM | POA: Diagnosis not present

## 2018-05-31 DIAGNOSIS — S98111A Complete traumatic amputation of right great toe, initial encounter: Secondary | ICD-10-CM | POA: Diagnosis not present

## 2018-05-31 DIAGNOSIS — I10 Essential (primary) hypertension: Secondary | ICD-10-CM | POA: Diagnosis not present

## 2018-06-01 DIAGNOSIS — I10 Essential (primary) hypertension: Secondary | ICD-10-CM | POA: Diagnosis not present

## 2018-06-01 DIAGNOSIS — M86271 Subacute osteomyelitis, right ankle and foot: Secondary | ICD-10-CM | POA: Diagnosis not present

## 2018-06-01 DIAGNOSIS — D509 Iron deficiency anemia, unspecified: Secondary | ICD-10-CM | POA: Diagnosis not present

## 2018-06-01 DIAGNOSIS — S98111A Complete traumatic amputation of right great toe, initial encounter: Secondary | ICD-10-CM | POA: Diagnosis not present

## 2018-06-01 DIAGNOSIS — E785 Hyperlipidemia, unspecified: Secondary | ICD-10-CM | POA: Diagnosis not present

## 2018-06-01 DIAGNOSIS — R278 Other lack of coordination: Secondary | ICD-10-CM | POA: Diagnosis not present

## 2018-06-01 DIAGNOSIS — M6281 Muscle weakness (generalized): Secondary | ICD-10-CM | POA: Diagnosis not present

## 2018-06-01 DIAGNOSIS — R2689 Other abnormalities of gait and mobility: Secondary | ICD-10-CM | POA: Diagnosis not present

## 2018-06-01 DIAGNOSIS — E1129 Type 2 diabetes mellitus with other diabetic kidney complication: Secondary | ICD-10-CM | POA: Diagnosis not present

## 2018-06-01 DIAGNOSIS — K219 Gastro-esophageal reflux disease without esophagitis: Secondary | ICD-10-CM | POA: Diagnosis not present

## 2018-06-01 DIAGNOSIS — E1122 Type 2 diabetes mellitus with diabetic chronic kidney disease: Secondary | ICD-10-CM | POA: Diagnosis not present

## 2018-06-01 DIAGNOSIS — T8141XA Infection following a procedure, superficial incisional surgical site, initial encounter: Secondary | ICD-10-CM | POA: Diagnosis not present

## 2018-06-02 ENCOUNTER — Ambulatory Visit: Payer: Medicare HMO | Admitting: Physician Assistant

## 2018-06-02 DIAGNOSIS — L97529 Non-pressure chronic ulcer of other part of left foot with unspecified severity: Secondary | ICD-10-CM | POA: Diagnosis not present

## 2018-06-02 DIAGNOSIS — E785 Hyperlipidemia, unspecified: Secondary | ICD-10-CM | POA: Diagnosis not present

## 2018-06-02 DIAGNOSIS — R278 Other lack of coordination: Secondary | ICD-10-CM | POA: Diagnosis not present

## 2018-06-02 DIAGNOSIS — M6281 Muscle weakness (generalized): Secondary | ICD-10-CM | POA: Diagnosis not present

## 2018-06-02 DIAGNOSIS — R2689 Other abnormalities of gait and mobility: Secondary | ICD-10-CM | POA: Diagnosis not present

## 2018-06-02 DIAGNOSIS — E1129 Type 2 diabetes mellitus with other diabetic kidney complication: Secondary | ICD-10-CM | POA: Diagnosis not present

## 2018-06-02 DIAGNOSIS — I10 Essential (primary) hypertension: Secondary | ICD-10-CM | POA: Diagnosis not present

## 2018-06-02 DIAGNOSIS — S98111A Complete traumatic amputation of right great toe, initial encounter: Secondary | ICD-10-CM | POA: Diagnosis not present

## 2018-06-02 DIAGNOSIS — D509 Iron deficiency anemia, unspecified: Secondary | ICD-10-CM | POA: Diagnosis not present

## 2018-06-02 DIAGNOSIS — K219 Gastro-esophageal reflux disease without esophagitis: Secondary | ICD-10-CM | POA: Diagnosis not present

## 2018-06-04 DIAGNOSIS — E1129 Type 2 diabetes mellitus with other diabetic kidney complication: Secondary | ICD-10-CM | POA: Diagnosis not present

## 2018-06-04 DIAGNOSIS — I10 Essential (primary) hypertension: Secondary | ICD-10-CM | POA: Diagnosis not present

## 2018-06-04 DIAGNOSIS — D509 Iron deficiency anemia, unspecified: Secondary | ICD-10-CM | POA: Diagnosis not present

## 2018-06-04 DIAGNOSIS — T8141XA Infection following a procedure, superficial incisional surgical site, initial encounter: Secondary | ICD-10-CM | POA: Diagnosis not present

## 2018-06-04 DIAGNOSIS — K219 Gastro-esophageal reflux disease without esophagitis: Secondary | ICD-10-CM | POA: Diagnosis not present

## 2018-06-04 DIAGNOSIS — R2689 Other abnormalities of gait and mobility: Secondary | ICD-10-CM | POA: Diagnosis not present

## 2018-06-04 DIAGNOSIS — M6281 Muscle weakness (generalized): Secondary | ICD-10-CM | POA: Diagnosis not present

## 2018-06-04 DIAGNOSIS — E785 Hyperlipidemia, unspecified: Secondary | ICD-10-CM | POA: Diagnosis not present

## 2018-06-04 DIAGNOSIS — R278 Other lack of coordination: Secondary | ICD-10-CM | POA: Diagnosis not present

## 2018-06-04 DIAGNOSIS — E1122 Type 2 diabetes mellitus with diabetic chronic kidney disease: Secondary | ICD-10-CM | POA: Diagnosis not present

## 2018-06-04 DIAGNOSIS — S98111A Complete traumatic amputation of right great toe, initial encounter: Secondary | ICD-10-CM | POA: Diagnosis not present

## 2018-06-04 DIAGNOSIS — M86271 Subacute osteomyelitis, right ankle and foot: Secondary | ICD-10-CM | POA: Diagnosis not present

## 2018-06-05 DIAGNOSIS — R278 Other lack of coordination: Secondary | ICD-10-CM | POA: Diagnosis not present

## 2018-06-05 DIAGNOSIS — D509 Iron deficiency anemia, unspecified: Secondary | ICD-10-CM | POA: Diagnosis not present

## 2018-06-05 DIAGNOSIS — M6281 Muscle weakness (generalized): Secondary | ICD-10-CM | POA: Diagnosis not present

## 2018-06-05 DIAGNOSIS — I10 Essential (primary) hypertension: Secondary | ICD-10-CM | POA: Diagnosis not present

## 2018-06-05 DIAGNOSIS — S98111A Complete traumatic amputation of right great toe, initial encounter: Secondary | ICD-10-CM | POA: Diagnosis not present

## 2018-06-05 DIAGNOSIS — R2689 Other abnormalities of gait and mobility: Secondary | ICD-10-CM | POA: Diagnosis not present

## 2018-06-05 DIAGNOSIS — E785 Hyperlipidemia, unspecified: Secondary | ICD-10-CM | POA: Diagnosis not present

## 2018-06-05 DIAGNOSIS — E1129 Type 2 diabetes mellitus with other diabetic kidney complication: Secondary | ICD-10-CM | POA: Diagnosis not present

## 2018-06-05 DIAGNOSIS — K219 Gastro-esophageal reflux disease without esophagitis: Secondary | ICD-10-CM | POA: Diagnosis not present

## 2018-06-07 DIAGNOSIS — K219 Gastro-esophageal reflux disease without esophagitis: Secondary | ICD-10-CM | POA: Diagnosis not present

## 2018-06-07 DIAGNOSIS — E785 Hyperlipidemia, unspecified: Secondary | ICD-10-CM | POA: Diagnosis not present

## 2018-06-07 DIAGNOSIS — E1129 Type 2 diabetes mellitus with other diabetic kidney complication: Secondary | ICD-10-CM | POA: Diagnosis not present

## 2018-06-07 DIAGNOSIS — R2689 Other abnormalities of gait and mobility: Secondary | ICD-10-CM | POA: Diagnosis not present

## 2018-06-07 DIAGNOSIS — I10 Essential (primary) hypertension: Secondary | ICD-10-CM | POA: Diagnosis not present

## 2018-06-07 DIAGNOSIS — D509 Iron deficiency anemia, unspecified: Secondary | ICD-10-CM | POA: Diagnosis not present

## 2018-06-07 DIAGNOSIS — M6281 Muscle weakness (generalized): Secondary | ICD-10-CM | POA: Diagnosis not present

## 2018-06-07 DIAGNOSIS — S98111A Complete traumatic amputation of right great toe, initial encounter: Secondary | ICD-10-CM | POA: Diagnosis not present

## 2018-06-07 DIAGNOSIS — R278 Other lack of coordination: Secondary | ICD-10-CM | POA: Diagnosis not present

## 2018-06-09 DIAGNOSIS — L97529 Non-pressure chronic ulcer of other part of left foot with unspecified severity: Secondary | ICD-10-CM | POA: Diagnosis not present

## 2018-06-15 ENCOUNTER — Ambulatory Visit: Payer: Medicare HMO | Admitting: Cardiology

## 2018-06-15 NOTE — Progress Notes (Deleted)
Cardiology Office Note:    Date:  06/15/2018   ID:  Sara Griffith, DOB July 05, 1936, MRN [id number]  PCP:  Kathyrn Lass, MD  Cardiologist:  Sherren Mocha, MD  Referring MD: Kathyrn Lass, MD   No chief complaint on file. ***  History of Present Illness:    Sara Griffith is a 82 y.o. 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   She was seen by cardiology during the hospitalization in 10/2016 for chest pain and hypertensive urgency.  There was a question of new onset of atrial fibrillation, however EKGs were reviewed and it was determined that her underlying rhythm was sinus rhythm with PACs.  No A. fib was observed during her hospitalization.  Amlodipine was added to her metoprolol and losartan and her BP improved.  Her chest pain resolved with improved blood pressure.  She underwent nuclear stress testing that was low risk for ischemia.  She was last seen in the office on 11/27/2016 at which time her blood pressure was well controlled.  Past Medical History:  Diagnosis Date  . Chronic osteomyelitis of toe, right ([place])   . CKD (chronic kidney disease)   . Diabetes mellitus without complication ([place])   . GERD (gastroesophageal reflux disease)   . HLD (hyperlipidemia)   . Hypertension   . IDA (iron deficiency anemia)   . PVD (peripheral vascular disease) ([place])   . RLS (restless legs syndrome)   . TIA (transient ischemic attack)   . Uses walker   . Vitamin D deficiency     Past Surgical History:  Procedure Laterality Date  . ABDOMINAL HYSTERECTOMY     with bladder tac  . AMPUTATION Right 08/07/2017   Procedure: RIGHT GREAT TOE AMPUTATION AT METATARSOPHALANGEAL JOINT;  Surgeon: Newt Minion, MD;  Location: [facility];  Service: Orthopedics;  Laterality: Right;  . AMPUTATION Right 04/16/2018   Procedure: RIGHT BELOW KNEE AMPUTATION;  Surgeon: Newt Minion, MD;  Location: [facility];  Service: Orthopedics;  Laterality:  Right;  . APPENDECTOMY    . CARPAL TUNNEL RELEASE Right   . CATARACT EXTRACTION, BILATERAL    . CERVICAL DISC SURGERY    . COLONOSCOPY    . DILATION AND CURETTAGE OF UTERUS     x 2  . left great toe amputation    . ROTATOR CUFF REPAIR Left     Current Medications: No outpatient medications have been marked as taking for the 06/15/18 encounter (Appointment) with Daune Perch, NP.     Allergies:   Cymbalta [duloxetine hcl]; Librax [chlordiazepoxide-clidinium]; Lyrica [pregabalin]; and Naproxen   Social History   Socioeconomic History  . Marital status: Widowed    Spouse name: Not on file  . Number of children: Not on file  . Years of education: Not on file  . Highest education level: Not on file  Occupational History  . Not on file  Social Needs  . Financial resource strain: Not on file  . Food insecurity:    Worry: Not on file    Inability: Not on file  . Transportation needs:    Medical: Not on file    Non-medical: Not on file  Tobacco Use  . Smoking status: Former Smoker    Years: 30.00    Types: Cigarettes  . Smokeless tobacco: Former Systems developer    Types: Snuff    Quit date: 07/01/1954  Substance and Sexual Activity  . Alcohol use: No  .  Drug use: No  . Sexual activity: Not on file  Lifestyle  . Physical activity:    Days per week: Not on file    Minutes per session: Not on file  . Stress: Not on file  Relationships  . Social connections:    Talks on phone: Not on file    Gets together: Not on file    Attends religious service: Not on file    Active member of club or organization: Not on file    Attends meetings of clubs or organizations: Not on file    Relationship status: Not on file  Other Topics Concern  . Not on file  Social History Narrative  . Not on file     Family History: The patient's ***family history includes Arrhythmia in her brother and sister; Cancer in her father; Hypertension in her mother. ROS:   Please see the history of present  illness.    *** All other systems reviewed and are negative.  EKGs/Labs/Other Studies Reviewed:    The following studies were reviewed today:  NST 10/22/16 Perfusion: No decreased activity in the left ventricle on stress imaging to suggest reversible ischemia or infarction.  Wall Motion: Normal left ventricular wall motion. No left ventricular dilation.  Left Ventricular Ejection Fraction: 83 %  End diastolic volume 45 ml  End systolic volume 8 ml  IMPRESSION: 1. No reversible ischemia or infarction. 2. Normal left ventricular wall motion. 3. Left ventricular ejection fraction 83% 4. Non invasive risk stratification*: Low   EKG:  EKG is *** ordered today.  The ekg ordered today demonstrates ***  Recent Labs: 08/08/2017: Magnesium 1.2 04/12/2018: ALT 18 04/19/2018: BUN 24; Creatinine, Ser 1.09; Hemoglobin 8.5; Platelets 422; Potassium 4.4; Sodium 138   Recent Lipid Panel    Component Value Date/Time   CHOL 145 10/22/2016 0600   TRIG 151 (H) 10/22/2016 0600   HDL 40 (L) 10/22/2016 0600   CHOLHDL 3.6 10/22/2016 0600   VLDL 30 10/22/2016 0600   LDLCALC 75 10/22/2016 0600    Physical Exam:    VS:  LMP  (LMP Unknown)     Wt Readings from Last 3 Encounters:  05/27/18 130 lb 11.7 oz (59.3 kg)  04/12/18 130 lb 11.7 oz (59.3 kg)  03/23/18 135 lb (61.2 kg)     Physical Exam***   ASSESSMENT:    1. Essential hypertension    PLAN:    In order of problems listed above:  Hypertension   Medication Adjustments/Labs and Tests Ordered: Current medicines are reviewed at length with the patient today.  Concerns regarding medicines are outlined above. Labs and tests ordered and medication changes are outlined in the patient instructions below:  There are no Patient Instructions on file for this visit.   Signed, Daune Perch, NP  06/15/2018 4:34 AM    [place]

## 2018-06-16 DIAGNOSIS — L97529 Non-pressure chronic ulcer of other part of left foot with unspecified severity: Secondary | ICD-10-CM | POA: Diagnosis not present

## 2018-06-28 ENCOUNTER — Ambulatory Visit (INDEPENDENT_AMBULATORY_CARE_PROVIDER_SITE_OTHER): Payer: Medicare HMO | Admitting: Orthopedic Surgery

## 2018-06-29 DIAGNOSIS — Z89511 Acquired absence of right leg below knee: Secondary | ICD-10-CM | POA: Diagnosis not present

## 2018-06-29 DIAGNOSIS — F0391 Unspecified dementia with behavioral disturbance: Secondary | ICD-10-CM | POA: Diagnosis not present

## 2018-06-29 DIAGNOSIS — G2581 Restless legs syndrome: Secondary | ICD-10-CM | POA: Diagnosis not present

## 2018-06-29 DIAGNOSIS — G459 Transient cerebral ischemic attack, unspecified: Secondary | ICD-10-CM | POA: Diagnosis not present

## 2018-06-29 DIAGNOSIS — I10 Essential (primary) hypertension: Secondary | ICD-10-CM | POA: Diagnosis not present

## 2018-06-29 DIAGNOSIS — I739 Peripheral vascular disease, unspecified: Secondary | ICD-10-CM | POA: Diagnosis not present

## 2018-06-29 DIAGNOSIS — E1122 Type 2 diabetes mellitus with diabetic chronic kidney disease: Secondary | ICD-10-CM | POA: Diagnosis not present

## 2018-06-29 DIAGNOSIS — E46 Unspecified protein-calorie malnutrition: Secondary | ICD-10-CM | POA: Diagnosis not present

## 2018-07-01 ENCOUNTER — Encounter: Payer: Self-pay | Admitting: Cardiology

## 2018-07-05 ENCOUNTER — Ambulatory Visit (INDEPENDENT_AMBULATORY_CARE_PROVIDER_SITE_OTHER): Payer: Medicare HMO | Admitting: Orthopedic Surgery

## 2018-07-05 ENCOUNTER — Encounter (INDEPENDENT_AMBULATORY_CARE_PROVIDER_SITE_OTHER): Payer: Self-pay | Admitting: Orthopedic Surgery

## 2018-07-05 VITALS — Ht 59.0 in | Wt 130.7 lb

## 2018-07-05 DIAGNOSIS — Z89511 Acquired absence of right leg below knee: Secondary | ICD-10-CM

## 2018-07-05 NOTE — Progress Notes (Signed)
Office Visit Note   Patient: Sara Griffith           Date of Birth: 1936-07-17           MRN: [id number] Visit Date: 07/05/2018              Requested by: Kathyrn Lass, [place], [place] [address] PCP: Kathyrn Lass, MD  Chief Complaint  Patient presents with  . Right Leg - Routine Post Op    Right BKA       HPI: Patient is an 82 year old woman who presents in follow-up for right transtibial amputation.  Patient's residual limb is well-healed.  She has been casted for her prosthesis this occurred 8 weeks postoperatively.  Patient is now 10 weeks postoperatively.  Assessment & Plan: Visit Diagnoses:  1. Acquired absence of right lower extremity below knee [facility])     Plan: Patient will begin gait training with her new prosthesis.  Follow-Up Instructions: Return in about 3 months (around 10/05/2018).   Ortho Exam  Patient is alert, oriented, no adenopathy, well-dressed, normal affect, normal respiratory effort. Examination patient has full active extension no extensor lag the residual limb is well-healed there is no open wounds no cellulitis no signs of infection.  Imaging: No results found. No images are attached to the encounter.  Labs: Lab Results  Component Value Date   HGBA1C 7.4 (H) 08/02/2017   HGBA1C 8.0 (H) 10/21/2016   HGBA1C 7.9 (H) 07/03/2016   ESRSEDRATE 126 (H) 04/12/2018   REPTSTATUS 04/17/2018 FINAL 04/12/2018   CULT  04/12/2018    NO GROWTH 5 DAYS Performed at [facility], [place] [address], [place],  [address]      Lab Results  Component Value Date   ALBUMIN 1.8 (L) 04/12/2018   ALBUMIN 2.3 (L) 04/11/2018   ALBUMIN 2.4 (L) 08/03/2017    Body mass index is 26.4 kg/m.  Orders:  No orders of the defined types were placed in this encounter.  No orders of the defined types were placed in this encounter.    Procedures: No procedures performed  Clinical Data: No additional findings.  ROS:  All other  systems negative, except as noted in the HPI. Review of Systems  Objective: Vital Signs: Ht 4\' 11"  (1.499 m)   Wt 130 lb 11.7 oz (59.3 kg)   LMP  (LMP Unknown)   BMI 26.40 kg/m   Specialty Comments:  No specialty comments available.  PMFS History: Patient Active Problem List   Diagnosis Date Noted  . Osteomyelitis of right foot ([place])   . Cellulitis 04/12/2018  . Skin ulcer ([place]) 04/12/2018  . Amputated great toe of right foot ([place]) 08/18/2017  . Idiopathic chronic venous hypertension of right lower extremity with ulcer and inflammation ([place]) 08/18/2017  . Type II diabetes mellitus with renal manifestations ([place]) 08/01/2017  . Fall 08/01/2017  . Left groin pain 08/01/2017  . Rhabdomyolysis 08/01/2017  . History of intracranial hemorrhage 08/01/2017  . Acute renal failure superimposed on stage 3 chronic kidney disease ([place]) 08/01/2017  . Fall at home, initial encounter   . Chest pressure 10/21/2016  . Chest pain at rest   . Hypertensive emergency   . Type 2 diabetes mellitus without complication, without long-term current use of insulin ([place])   . RLS (restless legs syndrome) 07/01/2016  . Cervical lymphadenitis 07/01/2016  . GERD (gastroesophageal reflux disease) 07/01/2016  . HLD (hyperlipidemia) 07/01/2016  . HTN (hypertension) 07/01/2016  . Lymphadenitis 07/01/2016  Past Medical History:  Diagnosis Date  . Chronic osteomyelitis of toe, right ([place])   . CKD (chronic kidney disease)   . Diabetes mellitus without complication ([place])   . GERD (gastroesophageal reflux disease)   . HLD (hyperlipidemia)   . Hypertension   . IDA (iron deficiency anemia)   . PVD (peripheral vascular disease) ([place])   . RLS (restless legs syndrome)   . TIA (transient ischemic attack)   . Uses walker   . Vitamin D deficiency     Family History  Problem Relation Age of Onset  . Hypertension Mother   . Cancer Father   . Arrhythmia Sister        ppm  . Arrhythmia Brother        ppm      Past Surgical History:  Procedure Laterality Date  . ABDOMINAL HYSTERECTOMY     with bladder tac  . AMPUTATION Right 08/07/2017   Procedure: RIGHT GREAT TOE AMPUTATION AT METATARSOPHALANGEAL JOINT;  Surgeon: Newt Minion, MD;  Location: [facility];  Service: Orthopedics;  Laterality: Right;  . AMPUTATION Right 04/16/2018   Procedure: RIGHT BELOW KNEE AMPUTATION;  Surgeon: Newt Minion, MD;  Location: [facility];  Service: Orthopedics;  Laterality: Right;  . APPENDECTOMY    . CARPAL TUNNEL RELEASE Right   . CATARACT EXTRACTION, BILATERAL    . CERVICAL DISC SURGERY    . COLONOSCOPY    . DILATION AND CURETTAGE OF UTERUS     x 2  . left great toe amputation    . ROTATOR CUFF REPAIR Left    Social History   Occupational History  . Not on file  Tobacco Use  . Smoking status: Former Smoker    Years: 30.00    Types: Cigarettes  . Smokeless tobacco: Former Systems developer    Types: Snuff    Quit date: 07/01/1954  Substance and Sexual Activity  . Alcohol use: No  . Drug use: No  . Sexual activity: Not on file

## 2018-07-22 DIAGNOSIS — I639 Cerebral infarction, unspecified: Secondary | ICD-10-CM | POA: Diagnosis not present

## 2018-07-22 DIAGNOSIS — I10 Essential (primary) hypertension: Secondary | ICD-10-CM | POA: Diagnosis not present

## 2018-07-22 DIAGNOSIS — F0391 Unspecified dementia with behavioral disturbance: Secondary | ICD-10-CM | POA: Diagnosis not present

## 2018-07-22 DIAGNOSIS — Z89511 Acquired absence of right leg below knee: Secondary | ICD-10-CM | POA: Diagnosis not present

## 2018-07-22 DIAGNOSIS — G2581 Restless legs syndrome: Secondary | ICD-10-CM | POA: Diagnosis not present

## 2018-07-22 DIAGNOSIS — N183 Chronic kidney disease, stage 3 (moderate): Secondary | ICD-10-CM | POA: Diagnosis not present

## 2018-07-22 DIAGNOSIS — D649 Anemia, unspecified: Secondary | ICD-10-CM | POA: Diagnosis not present

## 2018-07-28 DIAGNOSIS — Z89511 Acquired absence of right leg below knee: Secondary | ICD-10-CM | POA: Diagnosis not present

## 2018-08-03 DIAGNOSIS — D509 Iron deficiency anemia, unspecified: Secondary | ICD-10-CM | POA: Diagnosis not present

## 2018-08-03 DIAGNOSIS — S98111A Complete traumatic amputation of right great toe, initial encounter: Secondary | ICD-10-CM | POA: Diagnosis not present

## 2018-08-03 DIAGNOSIS — R278 Other lack of coordination: Secondary | ICD-10-CM | POA: Diagnosis not present

## 2018-08-03 DIAGNOSIS — K219 Gastro-esophageal reflux disease without esophagitis: Secondary | ICD-10-CM | POA: Diagnosis not present

## 2018-08-03 DIAGNOSIS — E1129 Type 2 diabetes mellitus with other diabetic kidney complication: Secondary | ICD-10-CM | POA: Diagnosis not present

## 2018-08-03 DIAGNOSIS — I10 Essential (primary) hypertension: Secondary | ICD-10-CM | POA: Diagnosis not present

## 2018-08-03 DIAGNOSIS — M6281 Muscle weakness (generalized): Secondary | ICD-10-CM | POA: Diagnosis not present

## 2018-08-03 DIAGNOSIS — E785 Hyperlipidemia, unspecified: Secondary | ICD-10-CM | POA: Diagnosis not present

## 2018-08-03 DIAGNOSIS — R2689 Other abnormalities of gait and mobility: Secondary | ICD-10-CM | POA: Diagnosis not present

## 2018-08-04 DIAGNOSIS — M6281 Muscle weakness (generalized): Secondary | ICD-10-CM | POA: Diagnosis not present

## 2018-08-04 DIAGNOSIS — R2689 Other abnormalities of gait and mobility: Secondary | ICD-10-CM | POA: Diagnosis not present

## 2018-08-04 DIAGNOSIS — R278 Other lack of coordination: Secondary | ICD-10-CM | POA: Diagnosis not present

## 2018-08-04 DIAGNOSIS — S98111A Complete traumatic amputation of right great toe, initial encounter: Secondary | ICD-10-CM | POA: Diagnosis not present

## 2018-08-04 DIAGNOSIS — I10 Essential (primary) hypertension: Secondary | ICD-10-CM | POA: Diagnosis not present

## 2018-08-04 DIAGNOSIS — E785 Hyperlipidemia, unspecified: Secondary | ICD-10-CM | POA: Diagnosis not present

## 2018-08-04 DIAGNOSIS — K219 Gastro-esophageal reflux disease without esophagitis: Secondary | ICD-10-CM | POA: Diagnosis not present

## 2018-08-04 DIAGNOSIS — E1129 Type 2 diabetes mellitus with other diabetic kidney complication: Secondary | ICD-10-CM | POA: Diagnosis not present

## 2018-08-04 DIAGNOSIS — D509 Iron deficiency anemia, unspecified: Secondary | ICD-10-CM | POA: Diagnosis not present

## 2018-08-05 DIAGNOSIS — I10 Essential (primary) hypertension: Secondary | ICD-10-CM | POA: Diagnosis not present

## 2018-08-05 DIAGNOSIS — D509 Iron deficiency anemia, unspecified: Secondary | ICD-10-CM | POA: Diagnosis not present

## 2018-08-05 DIAGNOSIS — E1129 Type 2 diabetes mellitus with other diabetic kidney complication: Secondary | ICD-10-CM | POA: Diagnosis not present

## 2018-08-05 DIAGNOSIS — R2689 Other abnormalities of gait and mobility: Secondary | ICD-10-CM | POA: Diagnosis not present

## 2018-08-05 DIAGNOSIS — K219 Gastro-esophageal reflux disease without esophagitis: Secondary | ICD-10-CM | POA: Diagnosis not present

## 2018-08-05 DIAGNOSIS — M6281 Muscle weakness (generalized): Secondary | ICD-10-CM | POA: Diagnosis not present

## 2018-08-05 DIAGNOSIS — R278 Other lack of coordination: Secondary | ICD-10-CM | POA: Diagnosis not present

## 2018-08-05 DIAGNOSIS — E785 Hyperlipidemia, unspecified: Secondary | ICD-10-CM | POA: Diagnosis not present

## 2018-08-05 DIAGNOSIS — S98111A Complete traumatic amputation of right great toe, initial encounter: Secondary | ICD-10-CM | POA: Diagnosis not present

## 2018-08-06 DIAGNOSIS — R278 Other lack of coordination: Secondary | ICD-10-CM | POA: Diagnosis not present

## 2018-08-06 DIAGNOSIS — D509 Iron deficiency anemia, unspecified: Secondary | ICD-10-CM | POA: Diagnosis not present

## 2018-08-06 DIAGNOSIS — M6281 Muscle weakness (generalized): Secondary | ICD-10-CM | POA: Diagnosis not present

## 2018-08-06 DIAGNOSIS — E1129 Type 2 diabetes mellitus with other diabetic kidney complication: Secondary | ICD-10-CM | POA: Diagnosis not present

## 2018-08-06 DIAGNOSIS — R2689 Other abnormalities of gait and mobility: Secondary | ICD-10-CM | POA: Diagnosis not present

## 2018-08-06 DIAGNOSIS — K219 Gastro-esophageal reflux disease without esophagitis: Secondary | ICD-10-CM | POA: Diagnosis not present

## 2018-08-06 DIAGNOSIS — E785 Hyperlipidemia, unspecified: Secondary | ICD-10-CM | POA: Diagnosis not present

## 2018-08-06 DIAGNOSIS — I10 Essential (primary) hypertension: Secondary | ICD-10-CM | POA: Diagnosis not present

## 2018-08-06 DIAGNOSIS — S98111A Complete traumatic amputation of right great toe, initial encounter: Secondary | ICD-10-CM | POA: Diagnosis not present

## 2018-08-09 DIAGNOSIS — D509 Iron deficiency anemia, unspecified: Secondary | ICD-10-CM | POA: Diagnosis not present

## 2018-08-09 DIAGNOSIS — K219 Gastro-esophageal reflux disease without esophagitis: Secondary | ICD-10-CM | POA: Diagnosis not present

## 2018-08-09 DIAGNOSIS — R278 Other lack of coordination: Secondary | ICD-10-CM | POA: Diagnosis not present

## 2018-08-09 DIAGNOSIS — I10 Essential (primary) hypertension: Secondary | ICD-10-CM | POA: Diagnosis not present

## 2018-08-09 DIAGNOSIS — S98111A Complete traumatic amputation of right great toe, initial encounter: Secondary | ICD-10-CM | POA: Diagnosis not present

## 2018-08-09 DIAGNOSIS — R2689 Other abnormalities of gait and mobility: Secondary | ICD-10-CM | POA: Diagnosis not present

## 2018-08-09 DIAGNOSIS — M6281 Muscle weakness (generalized): Secondary | ICD-10-CM | POA: Diagnosis not present

## 2018-08-09 DIAGNOSIS — E1129 Type 2 diabetes mellitus with other diabetic kidney complication: Secondary | ICD-10-CM | POA: Diagnosis not present

## 2018-08-09 DIAGNOSIS — E785 Hyperlipidemia, unspecified: Secondary | ICD-10-CM | POA: Diagnosis not present

## 2018-08-10 DIAGNOSIS — I10 Essential (primary) hypertension: Secondary | ICD-10-CM | POA: Diagnosis not present

## 2018-08-10 DIAGNOSIS — E785 Hyperlipidemia, unspecified: Secondary | ICD-10-CM | POA: Diagnosis not present

## 2018-08-10 DIAGNOSIS — D509 Iron deficiency anemia, unspecified: Secondary | ICD-10-CM | POA: Diagnosis not present

## 2018-08-10 DIAGNOSIS — S98111A Complete traumatic amputation of right great toe, initial encounter: Secondary | ICD-10-CM | POA: Diagnosis not present

## 2018-08-10 DIAGNOSIS — R278 Other lack of coordination: Secondary | ICD-10-CM | POA: Diagnosis not present

## 2018-08-10 DIAGNOSIS — K219 Gastro-esophageal reflux disease without esophagitis: Secondary | ICD-10-CM | POA: Diagnosis not present

## 2018-08-10 DIAGNOSIS — E1129 Type 2 diabetes mellitus with other diabetic kidney complication: Secondary | ICD-10-CM | POA: Diagnosis not present

## 2018-08-10 DIAGNOSIS — R2689 Other abnormalities of gait and mobility: Secondary | ICD-10-CM | POA: Diagnosis not present

## 2018-08-10 DIAGNOSIS — M6281 Muscle weakness (generalized): Secondary | ICD-10-CM | POA: Diagnosis not present

## 2018-08-11 DIAGNOSIS — D509 Iron deficiency anemia, unspecified: Secondary | ICD-10-CM | POA: Diagnosis not present

## 2018-08-11 DIAGNOSIS — M6281 Muscle weakness (generalized): Secondary | ICD-10-CM | POA: Diagnosis not present

## 2018-08-11 DIAGNOSIS — S98111A Complete traumatic amputation of right great toe, initial encounter: Secondary | ICD-10-CM | POA: Diagnosis not present

## 2018-08-11 DIAGNOSIS — E1129 Type 2 diabetes mellitus with other diabetic kidney complication: Secondary | ICD-10-CM | POA: Diagnosis not present

## 2018-08-11 DIAGNOSIS — R278 Other lack of coordination: Secondary | ICD-10-CM | POA: Diagnosis not present

## 2018-08-11 DIAGNOSIS — K219 Gastro-esophageal reflux disease without esophagitis: Secondary | ICD-10-CM | POA: Diagnosis not present

## 2018-08-11 DIAGNOSIS — E785 Hyperlipidemia, unspecified: Secondary | ICD-10-CM | POA: Diagnosis not present

## 2018-08-11 DIAGNOSIS — R2689 Other abnormalities of gait and mobility: Secondary | ICD-10-CM | POA: Diagnosis not present

## 2018-08-11 DIAGNOSIS — I10 Essential (primary) hypertension: Secondary | ICD-10-CM | POA: Diagnosis not present

## 2018-08-12 DIAGNOSIS — E785 Hyperlipidemia, unspecified: Secondary | ICD-10-CM | POA: Diagnosis not present

## 2018-08-12 DIAGNOSIS — S98111A Complete traumatic amputation of right great toe, initial encounter: Secondary | ICD-10-CM | POA: Diagnosis not present

## 2018-08-12 DIAGNOSIS — I10 Essential (primary) hypertension: Secondary | ICD-10-CM | POA: Diagnosis not present

## 2018-08-12 DIAGNOSIS — R2689 Other abnormalities of gait and mobility: Secondary | ICD-10-CM | POA: Diagnosis not present

## 2018-08-12 DIAGNOSIS — E1129 Type 2 diabetes mellitus with other diabetic kidney complication: Secondary | ICD-10-CM | POA: Diagnosis not present

## 2018-08-12 DIAGNOSIS — M6281 Muscle weakness (generalized): Secondary | ICD-10-CM | POA: Diagnosis not present

## 2018-08-12 DIAGNOSIS — K219 Gastro-esophageal reflux disease without esophagitis: Secondary | ICD-10-CM | POA: Diagnosis not present

## 2018-08-12 DIAGNOSIS — D509 Iron deficiency anemia, unspecified: Secondary | ICD-10-CM | POA: Diagnosis not present

## 2018-08-12 DIAGNOSIS — R278 Other lack of coordination: Secondary | ICD-10-CM | POA: Diagnosis not present

## 2018-08-13 DIAGNOSIS — R278 Other lack of coordination: Secondary | ICD-10-CM | POA: Diagnosis not present

## 2018-08-13 DIAGNOSIS — D509 Iron deficiency anemia, unspecified: Secondary | ICD-10-CM | POA: Diagnosis not present

## 2018-08-13 DIAGNOSIS — R2689 Other abnormalities of gait and mobility: Secondary | ICD-10-CM | POA: Diagnosis not present

## 2018-08-13 DIAGNOSIS — I10 Essential (primary) hypertension: Secondary | ICD-10-CM | POA: Diagnosis not present

## 2018-08-13 DIAGNOSIS — E785 Hyperlipidemia, unspecified: Secondary | ICD-10-CM | POA: Diagnosis not present

## 2018-08-13 DIAGNOSIS — M6281 Muscle weakness (generalized): Secondary | ICD-10-CM | POA: Diagnosis not present

## 2018-08-13 DIAGNOSIS — K219 Gastro-esophageal reflux disease without esophagitis: Secondary | ICD-10-CM | POA: Diagnosis not present

## 2018-08-13 DIAGNOSIS — E1129 Type 2 diabetes mellitus with other diabetic kidney complication: Secondary | ICD-10-CM | POA: Diagnosis not present

## 2018-08-13 DIAGNOSIS — S98111A Complete traumatic amputation of right great toe, initial encounter: Secondary | ICD-10-CM | POA: Diagnosis not present

## 2018-08-14 DIAGNOSIS — S98111A Complete traumatic amputation of right great toe, initial encounter: Secondary | ICD-10-CM | POA: Diagnosis not present

## 2018-08-14 DIAGNOSIS — M6281 Muscle weakness (generalized): Secondary | ICD-10-CM | POA: Diagnosis not present

## 2018-08-14 DIAGNOSIS — I10 Essential (primary) hypertension: Secondary | ICD-10-CM | POA: Diagnosis not present

## 2018-08-14 DIAGNOSIS — E785 Hyperlipidemia, unspecified: Secondary | ICD-10-CM | POA: Diagnosis not present

## 2018-08-14 DIAGNOSIS — R2689 Other abnormalities of gait and mobility: Secondary | ICD-10-CM | POA: Diagnosis not present

## 2018-08-14 DIAGNOSIS — R278 Other lack of coordination: Secondary | ICD-10-CM | POA: Diagnosis not present

## 2018-08-14 DIAGNOSIS — D509 Iron deficiency anemia, unspecified: Secondary | ICD-10-CM | POA: Diagnosis not present

## 2018-08-14 DIAGNOSIS — K219 Gastro-esophageal reflux disease without esophagitis: Secondary | ICD-10-CM | POA: Diagnosis not present

## 2018-08-14 DIAGNOSIS — E1129 Type 2 diabetes mellitus with other diabetic kidney complication: Secondary | ICD-10-CM | POA: Diagnosis not present

## 2018-08-17 DIAGNOSIS — D509 Iron deficiency anemia, unspecified: Secondary | ICD-10-CM | POA: Diagnosis not present

## 2018-08-17 DIAGNOSIS — S98111A Complete traumatic amputation of right great toe, initial encounter: Secondary | ICD-10-CM | POA: Diagnosis not present

## 2018-08-17 DIAGNOSIS — E1129 Type 2 diabetes mellitus with other diabetic kidney complication: Secondary | ICD-10-CM | POA: Diagnosis not present

## 2018-08-17 DIAGNOSIS — I10 Essential (primary) hypertension: Secondary | ICD-10-CM | POA: Diagnosis not present

## 2018-08-17 DIAGNOSIS — F419 Anxiety disorder, unspecified: Secondary | ICD-10-CM | POA: Diagnosis not present

## 2018-08-17 DIAGNOSIS — R2689 Other abnormalities of gait and mobility: Secondary | ICD-10-CM | POA: Diagnosis not present

## 2018-08-17 DIAGNOSIS — E785 Hyperlipidemia, unspecified: Secondary | ICD-10-CM | POA: Diagnosis not present

## 2018-08-17 DIAGNOSIS — K219 Gastro-esophageal reflux disease without esophagitis: Secondary | ICD-10-CM | POA: Diagnosis not present

## 2018-08-17 DIAGNOSIS — F321 Major depressive disorder, single episode, moderate: Secondary | ICD-10-CM | POA: Diagnosis not present

## 2018-08-17 DIAGNOSIS — R278 Other lack of coordination: Secondary | ICD-10-CM | POA: Diagnosis not present

## 2018-08-17 DIAGNOSIS — M6281 Muscle weakness (generalized): Secondary | ICD-10-CM | POA: Diagnosis not present

## 2018-08-18 DIAGNOSIS — K219 Gastro-esophageal reflux disease without esophagitis: Secondary | ICD-10-CM | POA: Diagnosis not present

## 2018-08-18 DIAGNOSIS — E1129 Type 2 diabetes mellitus with other diabetic kidney complication: Secondary | ICD-10-CM | POA: Diagnosis not present

## 2018-08-18 DIAGNOSIS — I10 Essential (primary) hypertension: Secondary | ICD-10-CM | POA: Diagnosis not present

## 2018-08-18 DIAGNOSIS — D509 Iron deficiency anemia, unspecified: Secondary | ICD-10-CM | POA: Diagnosis not present

## 2018-08-18 DIAGNOSIS — S98111A Complete traumatic amputation of right great toe, initial encounter: Secondary | ICD-10-CM | POA: Diagnosis not present

## 2018-08-18 DIAGNOSIS — R2689 Other abnormalities of gait and mobility: Secondary | ICD-10-CM | POA: Diagnosis not present

## 2018-08-18 DIAGNOSIS — R278 Other lack of coordination: Secondary | ICD-10-CM | POA: Diagnosis not present

## 2018-08-18 DIAGNOSIS — M6281 Muscle weakness (generalized): Secondary | ICD-10-CM | POA: Diagnosis not present

## 2018-08-18 DIAGNOSIS — E785 Hyperlipidemia, unspecified: Secondary | ICD-10-CM | POA: Diagnosis not present

## 2018-08-19 DIAGNOSIS — E1129 Type 2 diabetes mellitus with other diabetic kidney complication: Secondary | ICD-10-CM | POA: Diagnosis not present

## 2018-08-19 DIAGNOSIS — I10 Essential (primary) hypertension: Secondary | ICD-10-CM | POA: Diagnosis not present

## 2018-08-19 DIAGNOSIS — E785 Hyperlipidemia, unspecified: Secondary | ICD-10-CM | POA: Diagnosis not present

## 2018-08-19 DIAGNOSIS — D509 Iron deficiency anemia, unspecified: Secondary | ICD-10-CM | POA: Diagnosis not present

## 2018-08-19 DIAGNOSIS — S98111A Complete traumatic amputation of right great toe, initial encounter: Secondary | ICD-10-CM | POA: Diagnosis not present

## 2018-08-19 DIAGNOSIS — R2689 Other abnormalities of gait and mobility: Secondary | ICD-10-CM | POA: Diagnosis not present

## 2018-08-19 DIAGNOSIS — K219 Gastro-esophageal reflux disease without esophagitis: Secondary | ICD-10-CM | POA: Diagnosis not present

## 2018-08-19 DIAGNOSIS — M6281 Muscle weakness (generalized): Secondary | ICD-10-CM | POA: Diagnosis not present

## 2018-08-19 DIAGNOSIS — R278 Other lack of coordination: Secondary | ICD-10-CM | POA: Diagnosis not present

## 2018-08-24 DIAGNOSIS — F0390 Unspecified dementia without behavioral disturbance: Secondary | ICD-10-CM | POA: Diagnosis not present

## 2018-08-24 DIAGNOSIS — G459 Transient cerebral ischemic attack, unspecified: Secondary | ICD-10-CM | POA: Diagnosis not present

## 2018-08-24 DIAGNOSIS — N183 Chronic kidney disease, stage 3 (moderate): Secondary | ICD-10-CM | POA: Diagnosis not present

## 2018-08-24 DIAGNOSIS — K219 Gastro-esophageal reflux disease without esophagitis: Secondary | ICD-10-CM | POA: Diagnosis not present

## 2018-08-24 DIAGNOSIS — I739 Peripheral vascular disease, unspecified: Secondary | ICD-10-CM | POA: Diagnosis not present

## 2018-08-24 DIAGNOSIS — E46 Unspecified protein-calorie malnutrition: Secondary | ICD-10-CM | POA: Diagnosis not present

## 2018-08-24 DIAGNOSIS — D649 Anemia, unspecified: Secondary | ICD-10-CM | POA: Diagnosis not present

## 2018-08-24 DIAGNOSIS — Z89511 Acquired absence of right leg below knee: Secondary | ICD-10-CM | POA: Diagnosis not present

## 2018-08-24 DIAGNOSIS — G2581 Restless legs syndrome: Secondary | ICD-10-CM | POA: Diagnosis not present

## 2018-08-31 DIAGNOSIS — F321 Major depressive disorder, single episode, moderate: Secondary | ICD-10-CM | POA: Diagnosis not present

## 2018-08-31 DIAGNOSIS — F419 Anxiety disorder, unspecified: Secondary | ICD-10-CM | POA: Diagnosis not present

## 2018-09-08 DIAGNOSIS — F419 Anxiety disorder, unspecified: Secondary | ICD-10-CM | POA: Diagnosis not present

## 2018-09-08 DIAGNOSIS — F321 Major depressive disorder, single episode, moderate: Secondary | ICD-10-CM | POA: Diagnosis not present

## 2018-09-14 DIAGNOSIS — F321 Major depressive disorder, single episode, moderate: Secondary | ICD-10-CM | POA: Diagnosis not present

## 2018-09-14 DIAGNOSIS — F419 Anxiety disorder, unspecified: Secondary | ICD-10-CM | POA: Diagnosis not present

## 2018-09-21 DIAGNOSIS — M6281 Muscle weakness (generalized): Secondary | ICD-10-CM | POA: Diagnosis not present

## 2018-09-21 DIAGNOSIS — R278 Other lack of coordination: Secondary | ICD-10-CM | POA: Diagnosis not present

## 2018-09-21 DIAGNOSIS — E785 Hyperlipidemia, unspecified: Secondary | ICD-10-CM | POA: Diagnosis not present

## 2018-09-21 DIAGNOSIS — R2689 Other abnormalities of gait and mobility: Secondary | ICD-10-CM | POA: Diagnosis not present

## 2018-09-21 DIAGNOSIS — S98111A Complete traumatic amputation of right great toe, initial encounter: Secondary | ICD-10-CM | POA: Diagnosis not present

## 2018-09-21 DIAGNOSIS — D509 Iron deficiency anemia, unspecified: Secondary | ICD-10-CM | POA: Diagnosis not present

## 2018-09-21 DIAGNOSIS — K219 Gastro-esophageal reflux disease without esophagitis: Secondary | ICD-10-CM | POA: Diagnosis not present

## 2018-09-21 DIAGNOSIS — E1129 Type 2 diabetes mellitus with other diabetic kidney complication: Secondary | ICD-10-CM | POA: Diagnosis not present

## 2018-09-21 DIAGNOSIS — I10 Essential (primary) hypertension: Secondary | ICD-10-CM | POA: Diagnosis not present

## 2018-09-23 DIAGNOSIS — E46 Unspecified protein-calorie malnutrition: Secondary | ICD-10-CM | POA: Diagnosis not present

## 2018-09-23 DIAGNOSIS — D649 Anemia, unspecified: Secondary | ICD-10-CM | POA: Diagnosis not present

## 2018-09-23 DIAGNOSIS — Z89511 Acquired absence of right leg below knee: Secondary | ICD-10-CM | POA: Diagnosis not present

## 2018-09-23 DIAGNOSIS — G2581 Restless legs syndrome: Secondary | ICD-10-CM | POA: Diagnosis not present

## 2018-09-23 DIAGNOSIS — F0390 Unspecified dementia without behavioral disturbance: Secondary | ICD-10-CM | POA: Diagnosis not present

## 2018-09-23 DIAGNOSIS — N183 Chronic kidney disease, stage 3 (moderate): Secondary | ICD-10-CM | POA: Diagnosis not present

## 2018-09-24 DIAGNOSIS — I10 Essential (primary) hypertension: Secondary | ICD-10-CM | POA: Diagnosis not present

## 2018-09-24 DIAGNOSIS — M6281 Muscle weakness (generalized): Secondary | ICD-10-CM | POA: Diagnosis not present

## 2018-09-24 DIAGNOSIS — K219 Gastro-esophageal reflux disease without esophagitis: Secondary | ICD-10-CM | POA: Diagnosis not present

## 2018-09-24 DIAGNOSIS — S98111A Complete traumatic amputation of right great toe, initial encounter: Secondary | ICD-10-CM | POA: Diagnosis not present

## 2018-09-24 DIAGNOSIS — E785 Hyperlipidemia, unspecified: Secondary | ICD-10-CM | POA: Diagnosis not present

## 2018-09-24 DIAGNOSIS — R2689 Other abnormalities of gait and mobility: Secondary | ICD-10-CM | POA: Diagnosis not present

## 2018-09-24 DIAGNOSIS — R278 Other lack of coordination: Secondary | ICD-10-CM | POA: Diagnosis not present

## 2018-09-24 DIAGNOSIS — D509 Iron deficiency anemia, unspecified: Secondary | ICD-10-CM | POA: Diagnosis not present

## 2018-09-24 DIAGNOSIS — E1129 Type 2 diabetes mellitus with other diabetic kidney complication: Secondary | ICD-10-CM | POA: Diagnosis not present

## 2018-09-25 DIAGNOSIS — R2689 Other abnormalities of gait and mobility: Secondary | ICD-10-CM | POA: Diagnosis not present

## 2018-09-25 DIAGNOSIS — E1129 Type 2 diabetes mellitus with other diabetic kidney complication: Secondary | ICD-10-CM | POA: Diagnosis not present

## 2018-09-25 DIAGNOSIS — D509 Iron deficiency anemia, unspecified: Secondary | ICD-10-CM | POA: Diagnosis not present

## 2018-09-25 DIAGNOSIS — I10 Essential (primary) hypertension: Secondary | ICD-10-CM | POA: Diagnosis not present

## 2018-09-25 DIAGNOSIS — E785 Hyperlipidemia, unspecified: Secondary | ICD-10-CM | POA: Diagnosis not present

## 2018-09-25 DIAGNOSIS — M6281 Muscle weakness (generalized): Secondary | ICD-10-CM | POA: Diagnosis not present

## 2018-09-25 DIAGNOSIS — R278 Other lack of coordination: Secondary | ICD-10-CM | POA: Diagnosis not present

## 2018-09-25 DIAGNOSIS — K219 Gastro-esophageal reflux disease without esophagitis: Secondary | ICD-10-CM | POA: Diagnosis not present

## 2018-09-25 DIAGNOSIS — S98111A Complete traumatic amputation of right great toe, initial encounter: Secondary | ICD-10-CM | POA: Diagnosis not present

## 2018-09-28 DIAGNOSIS — E785 Hyperlipidemia, unspecified: Secondary | ICD-10-CM | POA: Diagnosis not present

## 2018-09-28 DIAGNOSIS — D509 Iron deficiency anemia, unspecified: Secondary | ICD-10-CM | POA: Diagnosis not present

## 2018-09-28 DIAGNOSIS — F419 Anxiety disorder, unspecified: Secondary | ICD-10-CM | POA: Diagnosis not present

## 2018-09-28 DIAGNOSIS — K219 Gastro-esophageal reflux disease without esophagitis: Secondary | ICD-10-CM | POA: Diagnosis not present

## 2018-09-28 DIAGNOSIS — E1129 Type 2 diabetes mellitus with other diabetic kidney complication: Secondary | ICD-10-CM | POA: Diagnosis not present

## 2018-09-28 DIAGNOSIS — S98111A Complete traumatic amputation of right great toe, initial encounter: Secondary | ICD-10-CM | POA: Diagnosis not present

## 2018-09-28 DIAGNOSIS — F321 Major depressive disorder, single episode, moderate: Secondary | ICD-10-CM | POA: Diagnosis not present

## 2018-09-28 DIAGNOSIS — I10 Essential (primary) hypertension: Secondary | ICD-10-CM | POA: Diagnosis not present

## 2018-09-28 DIAGNOSIS — R278 Other lack of coordination: Secondary | ICD-10-CM | POA: Diagnosis not present

## 2018-09-28 DIAGNOSIS — R2689 Other abnormalities of gait and mobility: Secondary | ICD-10-CM | POA: Diagnosis not present

## 2018-09-28 DIAGNOSIS — M6281 Muscle weakness (generalized): Secondary | ICD-10-CM | POA: Diagnosis not present

## 2018-09-29 DIAGNOSIS — S98111A Complete traumatic amputation of right great toe, initial encounter: Secondary | ICD-10-CM | POA: Diagnosis not present

## 2018-09-29 DIAGNOSIS — R278 Other lack of coordination: Secondary | ICD-10-CM | POA: Diagnosis not present

## 2018-09-29 DIAGNOSIS — E785 Hyperlipidemia, unspecified: Secondary | ICD-10-CM | POA: Diagnosis not present

## 2018-09-29 DIAGNOSIS — R2689 Other abnormalities of gait and mobility: Secondary | ICD-10-CM | POA: Diagnosis not present

## 2018-09-29 DIAGNOSIS — E1129 Type 2 diabetes mellitus with other diabetic kidney complication: Secondary | ICD-10-CM | POA: Diagnosis not present

## 2018-09-29 DIAGNOSIS — M6281 Muscle weakness (generalized): Secondary | ICD-10-CM | POA: Diagnosis not present

## 2018-09-29 DIAGNOSIS — I10 Essential (primary) hypertension: Secondary | ICD-10-CM | POA: Diagnosis not present

## 2018-09-29 DIAGNOSIS — K219 Gastro-esophageal reflux disease without esophagitis: Secondary | ICD-10-CM | POA: Diagnosis not present

## 2018-09-29 DIAGNOSIS — D509 Iron deficiency anemia, unspecified: Secondary | ICD-10-CM | POA: Diagnosis not present

## 2018-10-01 DIAGNOSIS — E1129 Type 2 diabetes mellitus with other diabetic kidney complication: Secondary | ICD-10-CM | POA: Diagnosis not present

## 2018-10-01 DIAGNOSIS — K219 Gastro-esophageal reflux disease without esophagitis: Secondary | ICD-10-CM | POA: Diagnosis not present

## 2018-10-01 DIAGNOSIS — I10 Essential (primary) hypertension: Secondary | ICD-10-CM | POA: Diagnosis not present

## 2018-10-01 DIAGNOSIS — M6281 Muscle weakness (generalized): Secondary | ICD-10-CM | POA: Diagnosis not present

## 2018-10-01 DIAGNOSIS — S98111A Complete traumatic amputation of right great toe, initial encounter: Secondary | ICD-10-CM | POA: Diagnosis not present

## 2018-10-01 DIAGNOSIS — R2689 Other abnormalities of gait and mobility: Secondary | ICD-10-CM | POA: Diagnosis not present

## 2018-10-01 DIAGNOSIS — R278 Other lack of coordination: Secondary | ICD-10-CM | POA: Diagnosis not present

## 2018-10-01 DIAGNOSIS — E785 Hyperlipidemia, unspecified: Secondary | ICD-10-CM | POA: Diagnosis not present

## 2018-10-01 DIAGNOSIS — D509 Iron deficiency anemia, unspecified: Secondary | ICD-10-CM | POA: Diagnosis not present

## 2018-10-05 ENCOUNTER — Ambulatory Visit (INDEPENDENT_AMBULATORY_CARE_PROVIDER_SITE_OTHER): Payer: Medicare HMO | Admitting: Orthopedic Surgery

## 2018-10-05 ENCOUNTER — Ambulatory Visit (INDEPENDENT_AMBULATORY_CARE_PROVIDER_SITE_OTHER): Payer: Medicare HMO | Admitting: Physician Assistant

## 2018-10-05 ENCOUNTER — Encounter (INDEPENDENT_AMBULATORY_CARE_PROVIDER_SITE_OTHER): Payer: Self-pay | Admitting: Physician Assistant

## 2018-10-05 VITALS — Ht 59.0 in | Wt 130.0 lb

## 2018-10-05 DIAGNOSIS — R278 Other lack of coordination: Secondary | ICD-10-CM | POA: Diagnosis not present

## 2018-10-05 DIAGNOSIS — K219 Gastro-esophageal reflux disease without esophagitis: Secondary | ICD-10-CM | POA: Diagnosis not present

## 2018-10-05 DIAGNOSIS — Z89511 Acquired absence of right leg below knee: Secondary | ICD-10-CM

## 2018-10-05 DIAGNOSIS — E785 Hyperlipidemia, unspecified: Secondary | ICD-10-CM | POA: Diagnosis not present

## 2018-10-05 DIAGNOSIS — E1142 Type 2 diabetes mellitus with diabetic polyneuropathy: Secondary | ICD-10-CM

## 2018-10-05 DIAGNOSIS — I87331 Chronic venous hypertension (idiopathic) with ulcer and inflammation of right lower extremity: Secondary | ICD-10-CM | POA: Diagnosis not present

## 2018-10-05 DIAGNOSIS — L97919 Non-pressure chronic ulcer of unspecified part of right lower leg with unspecified severity: Secondary | ICD-10-CM | POA: Diagnosis not present

## 2018-10-05 DIAGNOSIS — S98111A Complete traumatic amputation of right great toe, initial encounter: Secondary | ICD-10-CM | POA: Diagnosis not present

## 2018-10-05 DIAGNOSIS — I10 Essential (primary) hypertension: Secondary | ICD-10-CM | POA: Diagnosis not present

## 2018-10-05 DIAGNOSIS — E43 Unspecified severe protein-calorie malnutrition: Secondary | ICD-10-CM

## 2018-10-05 DIAGNOSIS — R2689 Other abnormalities of gait and mobility: Secondary | ICD-10-CM | POA: Diagnosis not present

## 2018-10-05 DIAGNOSIS — D509 Iron deficiency anemia, unspecified: Secondary | ICD-10-CM | POA: Diagnosis not present

## 2018-10-05 DIAGNOSIS — M6281 Muscle weakness (generalized): Secondary | ICD-10-CM | POA: Diagnosis not present

## 2018-10-05 DIAGNOSIS — E1129 Type 2 diabetes mellitus with other diabetic kidney complication: Secondary | ICD-10-CM | POA: Diagnosis not present

## 2018-10-05 NOTE — Progress Notes (Signed)
Office Visit Note   Patient: Sara Griffith           Date of Birth: 15-Jul-1936           MRN: [id number] Visit Date: 10/05/2018              Requested by: Kathyrn Lass, [place], [place] [address] PCP: Kathyrn Lass, MD  Chief Complaint  Patient presents with  . Right Leg - Follow-up    04/2018 BKA  [facility]      HPI: The patient is a 83 year old woman who underwent a right transtibial amputation on 04/16/2018.  She has obtained a prosthesis from [facility] but has developed some intermittent areas of pressure over the distal and medial residual limb.  This has hampered her ability to progress with her ambulation.  She is due to see [facility] clinic again next week and will discuss the pressure areas with them.  She is currently in skilled nursing working with physical therapy.  She has had her prosthetic since November 2019.  Assessment & Plan: Visit Diagnoses:  1. Acquired absence of right lower extremity below knee ([place])   2. Idiopathic chronic venous hypertension of right lower extremity with ulcer and inflammation (HCC)   3. Diabetic polyneuropathy associated with type 2 diabetes mellitus ([place])   4. Severe protein-calorie malnutrition ([place])     Plan: We applied some Mepilex border over the pressure areas of the distal residual limb.  She is going to follow-up with [facility] clinic next week to see if there is some way she can offload the distal residual limb.  She will follow-up here in 4 weeks after seeing them.  Follow-Up Instructions: Return in about 4 weeks (around 11/02/2018).   Ortho Exam  Patient is alert, oriented, no adenopathy, well-dressed, normal affect, normal respiratory effort. The right transtibial amputation site is well-healed without signs or evidence of cellulitis or infection.  She does have some very mild edema.  She has some pressure areas over the distal residual limb over her prominence distal tibia without overt breakdown.  She is wearing a 2  XL silver shrinker stocking.  She has full knee extension and good flexion.  Imaging: No results found. No images are attached to the encounter.  Labs: Lab Results  Component Value Date   HGBA1C 7.4 (H) 08/02/2017   HGBA1C 8.0 (H) 10/21/2016   HGBA1C 7.9 (H) 07/03/2016   ESRSEDRATE 126 (H) 04/12/2018   REPTSTATUS 04/17/2018 FINAL 04/12/2018   CULT  04/12/2018    NO GROWTH 5 DAYS Performed at [facility], [place] [address], [place], [place] [address]      Lab Results  Component Value Date   ALBUMIN 1.8 (L) 04/12/2018   ALBUMIN 2.3 (L) 04/11/2018   ALBUMIN 2.4 (L) 08/03/2017    Body mass index is 26.26 kg/m.  Orders:  No orders of the defined types were placed in this encounter.  No orders of the defined types were placed in this encounter.    Procedures: No procedures performed  Clinical Data: No additional findings.  ROS:  All other systems negative, except as noted in the HPI. Review of Systems  Objective: Vital Signs: Ht 4\' 11"  (1.499 m)   Wt 130 lb (59 kg)   LMP  (LMP Unknown)   BMI 26.26 kg/m   Specialty Comments:  No specialty comments available.  PMFS History: Patient Active Problem List   Diagnosis Date Noted  . Osteomyelitis of right foot ([place])   .  Cellulitis 04/12/2018  . Skin ulcer ([place]) 04/12/2018  . Amputated great toe of right foot ([place]) 08/18/2017  . Idiopathic chronic venous hypertension of right lower extremity with ulcer and inflammation ([place]) 08/18/2017  . Type II diabetes mellitus with renal manifestations ([place]) 08/01/2017  . Fall 08/01/2017  . Left groin pain 08/01/2017  . Rhabdomyolysis 08/01/2017  . History of intracranial hemorrhage 08/01/2017  . Acute renal failure superimposed on stage 3 chronic kidney disease ([place]) 08/01/2017  . Fall at home, initial encounter   . Chest pressure 10/21/2016  . Chest pain at rest   . Hypertensive emergency   . Type 2 diabetes mellitus without complication, without long-term  current use of insulin ([place])   . RLS (restless legs syndrome) 07/01/2016  . Cervical lymphadenitis 07/01/2016  . GERD (gastroesophageal reflux disease) 07/01/2016  . HLD (hyperlipidemia) 07/01/2016  . HTN (hypertension) 07/01/2016  . Lymphadenitis 07/01/2016   Past Medical History:  Diagnosis Date  . Chronic osteomyelitis of toe, right ([place])   . CKD (chronic kidney disease)   . Diabetes mellitus without complication ([place])   . GERD (gastroesophageal reflux disease)   . HLD (hyperlipidemia)   . Hypertension   . IDA (iron deficiency anemia)   . PVD (peripheral vascular disease) ([place])   . RLS (restless legs syndrome)   . TIA (transient ischemic attack)   . Uses walker   . Vitamin D deficiency     Family History  Problem Relation Age of Onset  . Hypertension Mother   . Cancer Father   . Arrhythmia Sister        ppm  . Arrhythmia Brother        ppm    Past Surgical History:  Procedure Laterality Date  . ABDOMINAL HYSTERECTOMY     with bladder tac  . AMPUTATION Right 08/07/2017   Procedure: RIGHT GREAT TOE AMPUTATION AT METATARSOPHALANGEAL JOINT;  Surgeon: Newt Minion, MD;  Location: [facility];  Service: Orthopedics;  Laterality: Right;  . AMPUTATION Right 04/16/2018   Procedure: RIGHT BELOW KNEE AMPUTATION;  Surgeon: Newt Minion, MD;  Location: [facility];  Service: Orthopedics;  Laterality: Right;  . APPENDECTOMY    . CARPAL TUNNEL RELEASE Right   . CATARACT EXTRACTION, BILATERAL    . CERVICAL DISC SURGERY    . COLONOSCOPY    . DILATION AND CURETTAGE OF UTERUS     x 2  . left great toe amputation    . ROTATOR CUFF REPAIR Left    Social History   Occupational History  . Not on file  Tobacco Use  . Smoking status: Former Smoker    Years: 30.00    Types: Cigarettes  . Smokeless tobacco: Former Systems developer    Types: Snuff    Quit date: 07/01/1954  Substance and Sexual Activity  . Alcohol use: No  . Drug use: No  . Sexual activity: Not on file

## 2018-10-06 DIAGNOSIS — R2689 Other abnormalities of gait and mobility: Secondary | ICD-10-CM | POA: Diagnosis not present

## 2018-10-06 DIAGNOSIS — E785 Hyperlipidemia, unspecified: Secondary | ICD-10-CM | POA: Diagnosis not present

## 2018-10-06 DIAGNOSIS — I10 Essential (primary) hypertension: Secondary | ICD-10-CM | POA: Diagnosis not present

## 2018-10-06 DIAGNOSIS — E1129 Type 2 diabetes mellitus with other diabetic kidney complication: Secondary | ICD-10-CM | POA: Diagnosis not present

## 2018-10-06 DIAGNOSIS — D509 Iron deficiency anemia, unspecified: Secondary | ICD-10-CM | POA: Diagnosis not present

## 2018-10-06 DIAGNOSIS — K219 Gastro-esophageal reflux disease without esophagitis: Secondary | ICD-10-CM | POA: Diagnosis not present

## 2018-10-06 DIAGNOSIS — S98111A Complete traumatic amputation of right great toe, initial encounter: Secondary | ICD-10-CM | POA: Diagnosis not present

## 2018-10-06 DIAGNOSIS — M6281 Muscle weakness (generalized): Secondary | ICD-10-CM | POA: Diagnosis not present

## 2018-10-06 DIAGNOSIS — R278 Other lack of coordination: Secondary | ICD-10-CM | POA: Diagnosis not present

## 2018-10-12 DIAGNOSIS — R278 Other lack of coordination: Secondary | ICD-10-CM | POA: Diagnosis not present

## 2018-10-12 DIAGNOSIS — R2689 Other abnormalities of gait and mobility: Secondary | ICD-10-CM | POA: Diagnosis not present

## 2018-10-12 DIAGNOSIS — M6281 Muscle weakness (generalized): Secondary | ICD-10-CM | POA: Diagnosis not present

## 2018-10-12 DIAGNOSIS — K219 Gastro-esophageal reflux disease without esophagitis: Secondary | ICD-10-CM | POA: Diagnosis not present

## 2018-10-12 DIAGNOSIS — S98111A Complete traumatic amputation of right great toe, initial encounter: Secondary | ICD-10-CM | POA: Diagnosis not present

## 2018-10-12 DIAGNOSIS — I10 Essential (primary) hypertension: Secondary | ICD-10-CM | POA: Diagnosis not present

## 2018-10-12 DIAGNOSIS — F419 Anxiety disorder, unspecified: Secondary | ICD-10-CM | POA: Diagnosis not present

## 2018-10-12 DIAGNOSIS — E1129 Type 2 diabetes mellitus with other diabetic kidney complication: Secondary | ICD-10-CM | POA: Diagnosis not present

## 2018-10-12 DIAGNOSIS — F321 Major depressive disorder, single episode, moderate: Secondary | ICD-10-CM | POA: Diagnosis not present

## 2018-10-12 DIAGNOSIS — D509 Iron deficiency anemia, unspecified: Secondary | ICD-10-CM | POA: Diagnosis not present

## 2018-10-12 DIAGNOSIS — E785 Hyperlipidemia, unspecified: Secondary | ICD-10-CM | POA: Diagnosis not present

## 2018-10-13 DIAGNOSIS — M6281 Muscle weakness (generalized): Secondary | ICD-10-CM | POA: Diagnosis not present

## 2018-10-13 DIAGNOSIS — N183 Chronic kidney disease, stage 3 (moderate): Secondary | ICD-10-CM | POA: Diagnosis not present

## 2018-10-13 DIAGNOSIS — K219 Gastro-esophageal reflux disease without esophagitis: Secondary | ICD-10-CM | POA: Diagnosis not present

## 2018-10-13 DIAGNOSIS — I10 Essential (primary) hypertension: Secondary | ICD-10-CM | POA: Diagnosis not present

## 2018-10-13 DIAGNOSIS — R2689 Other abnormalities of gait and mobility: Secondary | ICD-10-CM | POA: Diagnosis not present

## 2018-10-13 DIAGNOSIS — E119 Type 2 diabetes mellitus without complications: Secondary | ICD-10-CM | POA: Diagnosis not present

## 2018-10-14 DIAGNOSIS — R6889 Other general symptoms and signs: Secondary | ICD-10-CM | POA: Diagnosis not present

## 2018-10-14 DIAGNOSIS — E08311 Diabetes mellitus due to underlying condition with unspecified diabetic retinopathy with macular edema: Secondary | ICD-10-CM | POA: Diagnosis not present

## 2018-10-15 DIAGNOSIS — T148XXA Other injury of unspecified body region, initial encounter: Secondary | ICD-10-CM | POA: Diagnosis not present

## 2018-10-15 DIAGNOSIS — E119 Type 2 diabetes mellitus without complications: Secondary | ICD-10-CM | POA: Diagnosis not present

## 2018-10-15 DIAGNOSIS — R7309 Other abnormal glucose: Secondary | ICD-10-CM | POA: Diagnosis not present

## 2018-10-19 DIAGNOSIS — G2581 Restless legs syndrome: Secondary | ICD-10-CM | POA: Diagnosis not present

## 2018-10-19 DIAGNOSIS — L409 Psoriasis, unspecified: Secondary | ICD-10-CM | POA: Diagnosis not present

## 2018-10-19 DIAGNOSIS — Z79899 Other long term (current) drug therapy: Secondary | ICD-10-CM | POA: Diagnosis not present

## 2018-10-19 DIAGNOSIS — R21 Rash and other nonspecific skin eruption: Secondary | ICD-10-CM | POA: Diagnosis not present

## 2018-10-25 DIAGNOSIS — E119 Type 2 diabetes mellitus without complications: Secondary | ICD-10-CM | POA: Diagnosis not present

## 2018-10-25 DIAGNOSIS — R739 Hyperglycemia, unspecified: Secondary | ICD-10-CM | POA: Diagnosis not present

## 2018-10-25 DIAGNOSIS — L0390 Cellulitis, unspecified: Secondary | ICD-10-CM | POA: Diagnosis not present

## 2018-10-25 DIAGNOSIS — Z79899 Other long term (current) drug therapy: Secondary | ICD-10-CM | POA: Diagnosis not present

## 2018-11-02 ENCOUNTER — Ambulatory Visit (INDEPENDENT_AMBULATORY_CARE_PROVIDER_SITE_OTHER): Payer: Medicare HMO | Admitting: Orthopedic Surgery

## 2018-11-09 ENCOUNTER — Ambulatory Visit (INDEPENDENT_AMBULATORY_CARE_PROVIDER_SITE_OTHER): Payer: Medicare HMO | Admitting: Orthopedic Surgery

## 2018-12-06 DIAGNOSIS — E119 Type 2 diabetes mellitus without complications: Secondary | ICD-10-CM | POA: Diagnosis not present

## 2018-12-06 DIAGNOSIS — L409 Psoriasis, unspecified: Secondary | ICD-10-CM | POA: Diagnosis not present

## 2018-12-06 DIAGNOSIS — I10 Essential (primary) hypertension: Secondary | ICD-10-CM | POA: Diagnosis not present

## 2018-12-06 DIAGNOSIS — R739 Hyperglycemia, unspecified: Secondary | ICD-10-CM | POA: Diagnosis not present

## 2018-12-07 DIAGNOSIS — Z79899 Other long term (current) drug therapy: Secondary | ICD-10-CM | POA: Diagnosis not present

## 2018-12-07 DIAGNOSIS — L299 Pruritus, unspecified: Secondary | ICD-10-CM | POA: Diagnosis not present

## 2018-12-07 DIAGNOSIS — L409 Psoriasis, unspecified: Secondary | ICD-10-CM | POA: Diagnosis not present

## 2018-12-15 DIAGNOSIS — E119 Type 2 diabetes mellitus without complications: Secondary | ICD-10-CM | POA: Diagnosis not present

## 2018-12-15 DIAGNOSIS — N183 Chronic kidney disease, stage 3 (moderate): Secondary | ICD-10-CM | POA: Diagnosis not present

## 2018-12-15 DIAGNOSIS — L409 Psoriasis, unspecified: Secondary | ICD-10-CM | POA: Diagnosis not present

## 2018-12-15 DIAGNOSIS — K219 Gastro-esophageal reflux disease without esophagitis: Secondary | ICD-10-CM | POA: Diagnosis not present

## 2018-12-20 DIAGNOSIS — Z79899 Other long term (current) drug therapy: Secondary | ICD-10-CM | POA: Diagnosis not present

## 2018-12-20 DIAGNOSIS — E119 Type 2 diabetes mellitus without complications: Secondary | ICD-10-CM | POA: Diagnosis not present

## 2018-12-20 DIAGNOSIS — L409 Psoriasis, unspecified: Secondary | ICD-10-CM | POA: Diagnosis not present

## 2018-12-20 DIAGNOSIS — I10 Essential (primary) hypertension: Secondary | ICD-10-CM | POA: Diagnosis not present

## 2019-01-25 DIAGNOSIS — Z8673 Personal history of transient ischemic attack (TIA), and cerebral infarction without residual deficits: Secondary | ICD-10-CM | POA: Diagnosis not present

## 2019-01-25 DIAGNOSIS — L909 Atrophic disorder of skin, unspecified: Secondary | ICD-10-CM | POA: Diagnosis not present

## 2019-01-25 DIAGNOSIS — R600 Localized edema: Secondary | ICD-10-CM | POA: Diagnosis not present

## 2019-01-25 DIAGNOSIS — M79605 Pain in left leg: Secondary | ICD-10-CM | POA: Diagnosis not present

## 2019-01-26 DIAGNOSIS — R2242 Localized swelling, mass and lump, left lower limb: Secondary | ICD-10-CM | POA: Diagnosis not present

## 2019-01-27 DIAGNOSIS — M79662 Pain in left lower leg: Secondary | ICD-10-CM | POA: Diagnosis not present

## 2019-01-27 DIAGNOSIS — I739 Peripheral vascular disease, unspecified: Secondary | ICD-10-CM | POA: Diagnosis not present

## 2019-02-11 DIAGNOSIS — Z79899 Other long term (current) drug therapy: Secondary | ICD-10-CM | POA: Diagnosis not present

## 2019-02-15 DIAGNOSIS — Z89511 Acquired absence of right leg below knee: Secondary | ICD-10-CM | POA: Diagnosis not present

## 2019-02-15 DIAGNOSIS — M79605 Pain in left leg: Secondary | ICD-10-CM | POA: Diagnosis not present

## 2019-02-15 DIAGNOSIS — M79672 Pain in left foot: Secondary | ICD-10-CM | POA: Diagnosis not present

## 2019-02-15 DIAGNOSIS — E119 Type 2 diabetes mellitus without complications: Secondary | ICD-10-CM | POA: Diagnosis not present

## 2019-02-16 DIAGNOSIS — I739 Peripheral vascular disease, unspecified: Secondary | ICD-10-CM | POA: Diagnosis not present

## 2019-02-17 DIAGNOSIS — M79662 Pain in left lower leg: Secondary | ICD-10-CM | POA: Diagnosis not present

## 2019-02-17 DIAGNOSIS — F418 Other specified anxiety disorders: Secondary | ICD-10-CM | POA: Diagnosis not present

## 2019-02-17 DIAGNOSIS — M79672 Pain in left foot: Secondary | ICD-10-CM | POA: Diagnosis not present

## 2019-02-17 DIAGNOSIS — R600 Localized edema: Secondary | ICD-10-CM | POA: Diagnosis not present

## 2019-02-23 DIAGNOSIS — M25552 Pain in left hip: Secondary | ICD-10-CM | POA: Diagnosis not present

## 2019-02-23 DIAGNOSIS — M79605 Pain in left leg: Secondary | ICD-10-CM | POA: Diagnosis not present

## 2019-02-24 DIAGNOSIS — Z20828 Contact with and (suspected) exposure to other viral communicable diseases: Secondary | ICD-10-CM | POA: Diagnosis not present

## 2019-02-25 IMAGING — CT CT CERVICAL SPINE W/O CM
5 of 8 series · 14 of 33 positions shown, 15 images · non-contrast
Comparison: 08/02/2017

CLINICAL DATA: Head trauma. Fall this morning.

EXAM:
CT HEAD WITHOUT CONTRAST
CT CERVICAL SPINE WITHOUT CONTRAST
TECHNIQUE: Multidetector CT imaging of the head and cervical spine was
performed following the standard protocol without intravenous
contrast. Multiplanar CT image reconstructions of the cervical spine
were also generated.

[Series 5: head bone · axial · 0.38mm/px · z∈[-72,-20]mm · 2 of 80 slices shown]
[im 27/80  bone]
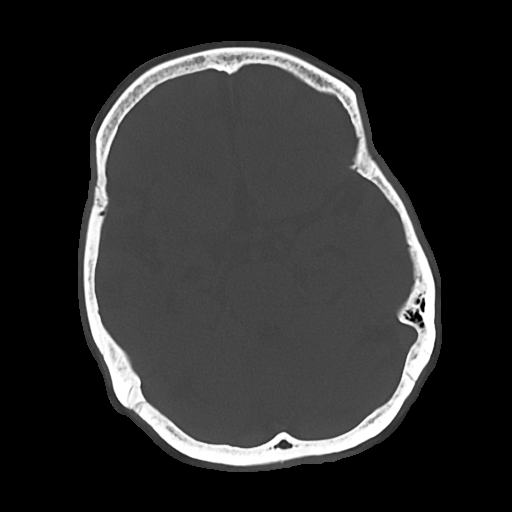
[im 53/80  bone]
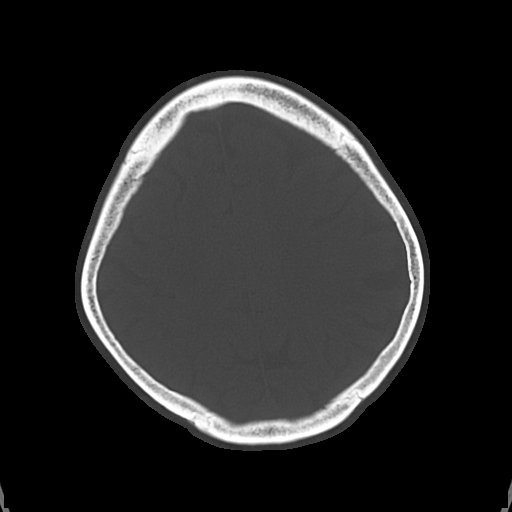

[Series 6: cor soft · coronal · 0.31mm/px · 3 of 61 slices shown]
[im 16/61  bone]
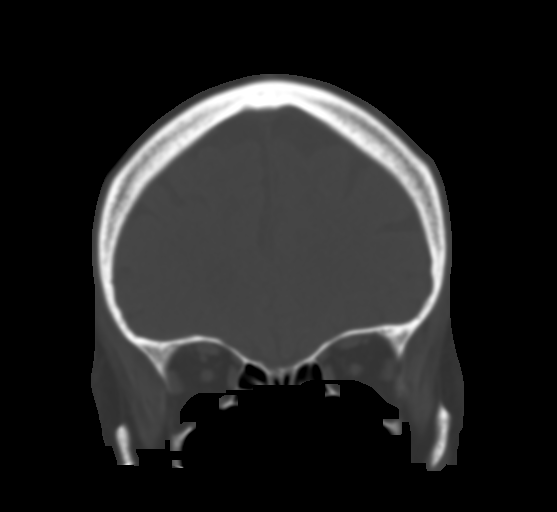
[im 31/61  bone]
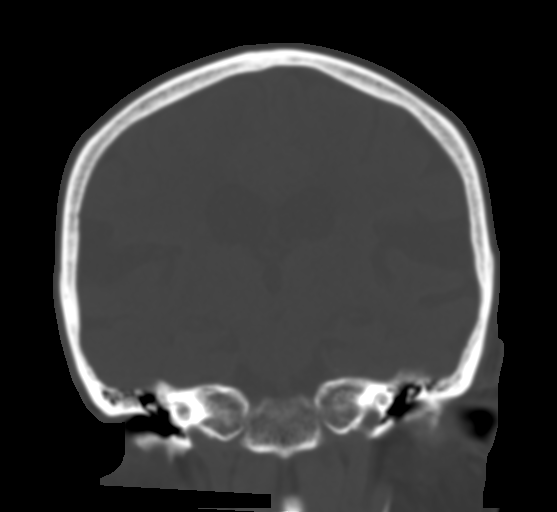
[im 46/61  bone]
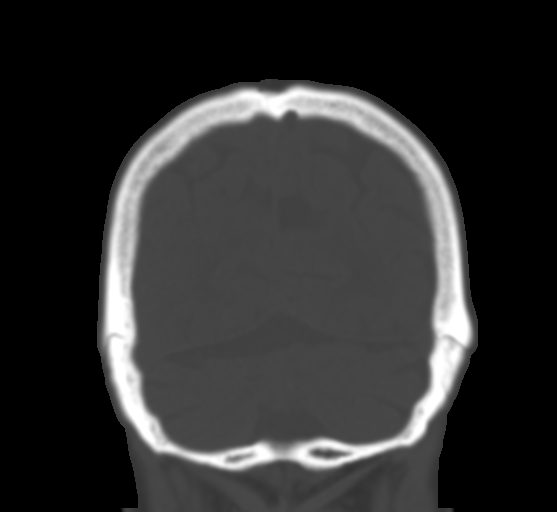

[Series 9: c spine soft · axial · 0.29mm/px · z∈[-190,-142]mm · 2 of 73 slices shown]
[im 25/73  soft-tissue]
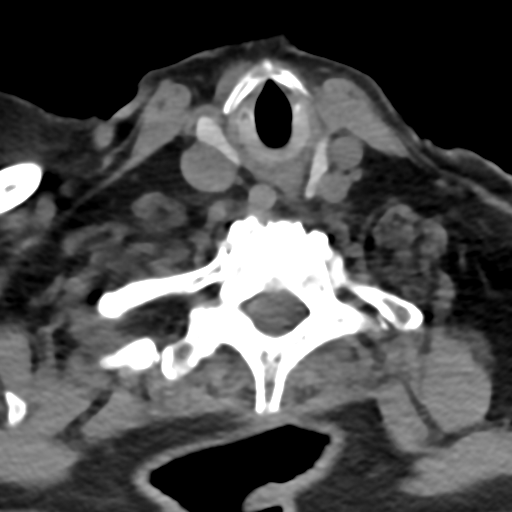
[im 49/73  soft-tissue]
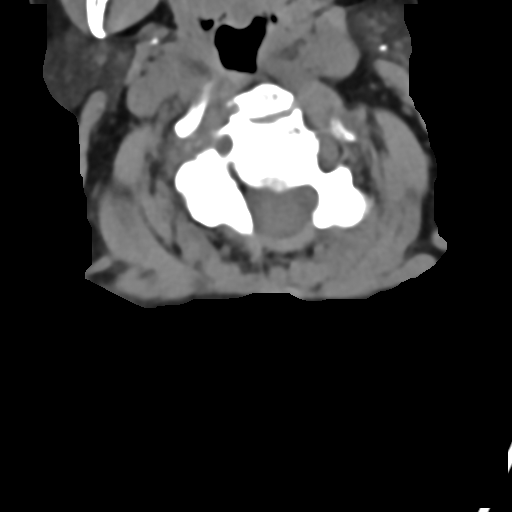

[Series 10: sag bone · sagittal · 0.23mm/px · 5 of 61 slices shown]
[im 11/61  bone]
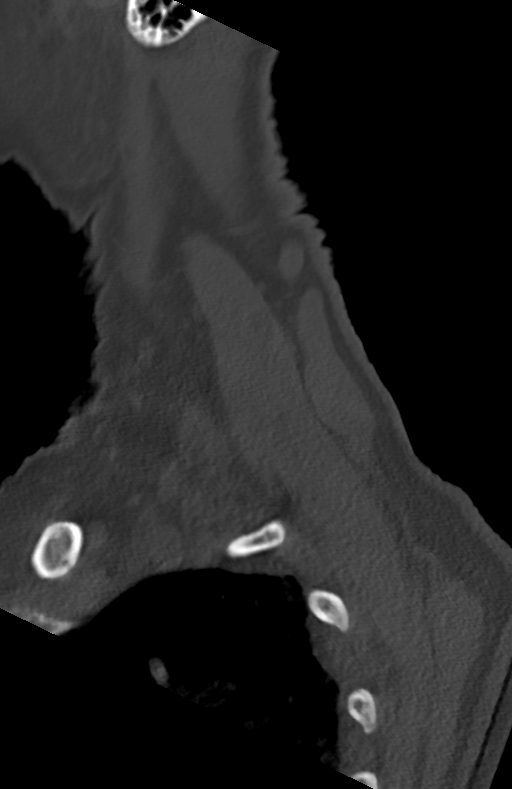
[im 21/61  bone]
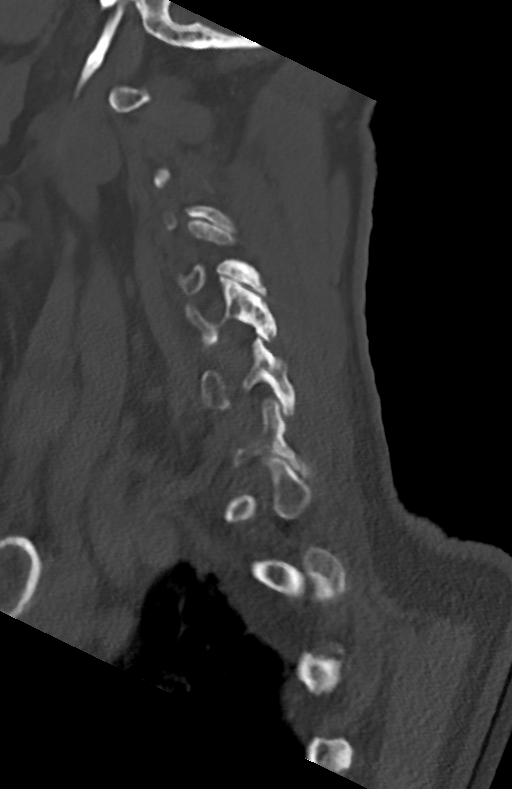
[im 31/61  bone]
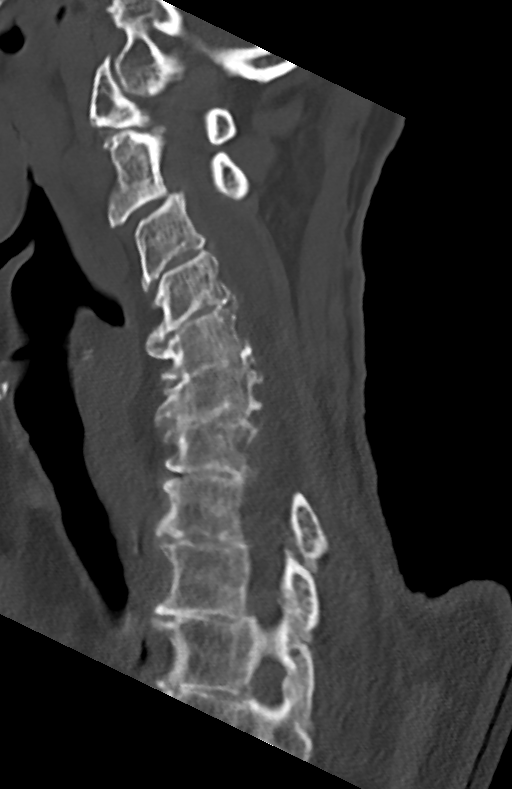
[im 41/61  bone]
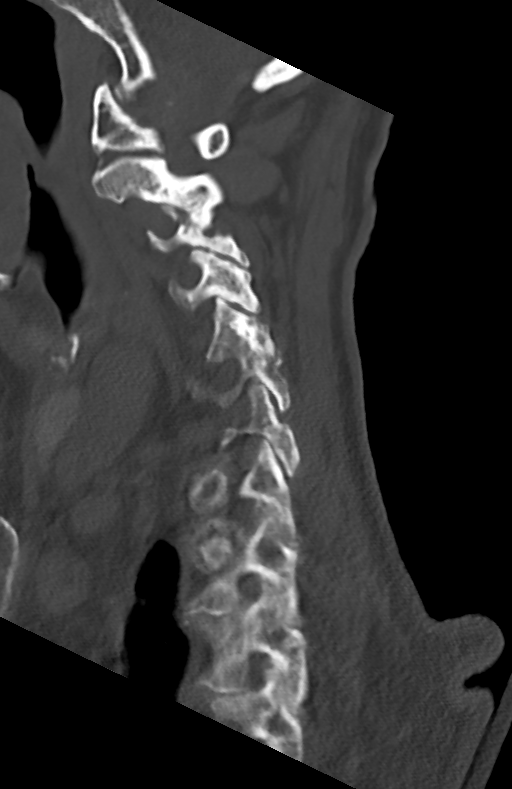
[im 51/61  bone]
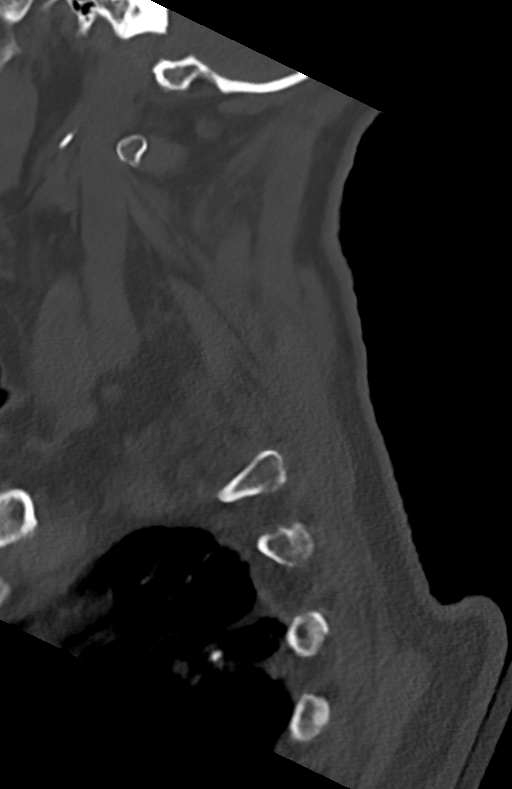

[Series 12: orthogonal axials · axial · 0.21mm/px · z∈[-206,-176]mm · 2 of 70 slices shown, 3 images]
[im 24/70  soft-tissue]
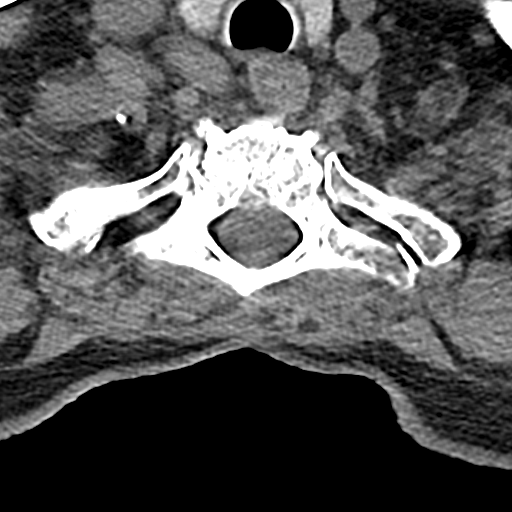
[im 24/70  bone]
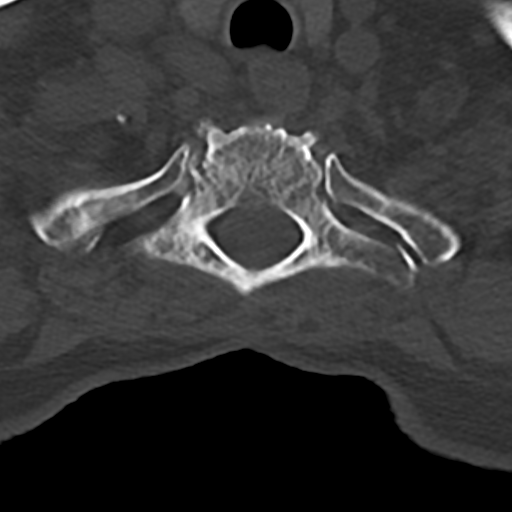
[im 47/70  bone]
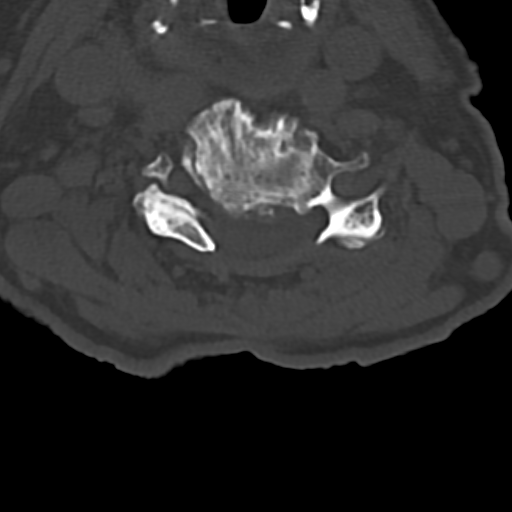

[14 of 33 positions shown; findings below may reference images not displayed]

FINDINGS: CT HEAD FINDINGS

Brain: No evidence of acute infarction, hemorrhage, hydrocephalus,
extra-axial collection or mass lesion/mass effect. There is mild
diffuse low-attenuation within the subcortical and periventricular
white matter compatible with chronic microvascular disease.
Prominence of the sulci and ventricles compatible with brain
atrophy.

Vascular: No hyperdense vessel or unexpected calcification.

Skull: Normal. Negative for fracture or focal lesion.

Sinuses/Orbits: No acute finding.

Other: None.

CT CERVICAL SPINE FINDINGS

Alignment: There is reversal of normal cervical lordosis. There is
an anterolisthesis of C2 on C3 measuring 4 mm.

Skull base and vertebrae: No acute fracture. No primary bone lesion
or focal pathologic process.

Soft tissues and spinal canal: No prevertebral fluid or swelling. No
visible canal hematoma.

Disc levels: Previous posterior laminectomy and decompression of C3
through C7. Solid fusion of C5 through C7 noted. Marked degenerative
disc disease noted at C3-4, C4-5 and C7-T1.

Upper chest: Calcified granuloma noted in the medial right upper
lobe.

Other: None
IMPRESSION: 1. No acute intracranial abnormalities. Chronic small vessel
ischemic disease and brain atrophy.
2. No evidence of acute cervical spine fracture.
3. Multilevel degenerative disc disease and cervical spondylosis, as
above.
4. Extensive cervical spondylosis is identified status post
posterior decompression of C3 through C7. There is an
anterolisthesis of C2 on C3 which is felt to likely reflect changes
secondary to chronic spondylosis.

## 2019-03-01 DIAGNOSIS — Z20828 Contact with and (suspected) exposure to other viral communicable diseases: Secondary | ICD-10-CM | POA: Diagnosis not present

## 2019-03-09 DIAGNOSIS — D649 Anemia, unspecified: Secondary | ICD-10-CM | POA: Diagnosis not present

## 2019-03-09 DIAGNOSIS — R6889 Other general symptoms and signs: Secondary | ICD-10-CM | POA: Diagnosis not present

## 2019-03-09 DIAGNOSIS — Z0000 Encounter for general adult medical examination without abnormal findings: Secondary | ICD-10-CM | POA: Diagnosis not present

## 2019-03-09 DIAGNOSIS — E08319 Diabetes mellitus due to underlying condition with unspecified diabetic retinopathy without macular edema: Secondary | ICD-10-CM | POA: Diagnosis not present

## 2019-03-09 DIAGNOSIS — Z20828 Contact with and (suspected) exposure to other viral communicable diseases: Secondary | ICD-10-CM | POA: Diagnosis not present

## 2019-03-09 DIAGNOSIS — E08311 Diabetes mellitus due to underlying condition with unspecified diabetic retinopathy with macular edema: Secondary | ICD-10-CM | POA: Diagnosis not present

## 2019-03-10 DIAGNOSIS — R0602 Shortness of breath: Secondary | ICD-10-CM | POA: Diagnosis not present

## 2019-03-10 DIAGNOSIS — J988 Other specified respiratory disorders: Secondary | ICD-10-CM | POA: Diagnosis not present

## 2019-03-10 DIAGNOSIS — U071 COVID-19: Secondary | ICD-10-CM | POA: Diagnosis not present

## 2019-03-10 DIAGNOSIS — R05 Cough: Secondary | ICD-10-CM | POA: Diagnosis not present

## 2019-03-21 DIAGNOSIS — J189 Pneumonia, unspecified organism: Secondary | ICD-10-CM | POA: Diagnosis not present

## 2019-03-21 DIAGNOSIS — Z79899 Other long term (current) drug therapy: Secondary | ICD-10-CM | POA: Diagnosis not present

## 2019-03-21 DIAGNOSIS — Z418 Encounter for other procedures for purposes other than remedying health state: Secondary | ICD-10-CM | POA: Diagnosis not present

## 2019-03-21 DIAGNOSIS — G8929 Other chronic pain: Secondary | ICD-10-CM | POA: Diagnosis not present

## 2019-03-24 DIAGNOSIS — R05 Cough: Secondary | ICD-10-CM | POA: Diagnosis not present

## 2019-03-24 DIAGNOSIS — J189 Pneumonia, unspecified organism: Secondary | ICD-10-CM | POA: Diagnosis not present

## 2019-03-24 DIAGNOSIS — Z79899 Other long term (current) drug therapy: Secondary | ICD-10-CM | POA: Diagnosis not present

## 2019-03-28 DIAGNOSIS — R0989 Other specified symptoms and signs involving the circulatory and respiratory systems: Secondary | ICD-10-CM | POA: Diagnosis not present

## 2019-03-28 DIAGNOSIS — R05 Cough: Secondary | ICD-10-CM | POA: Diagnosis not present

## 2019-03-28 DIAGNOSIS — R109 Unspecified abdominal pain: Secondary | ICD-10-CM | POA: Diagnosis not present

## 2019-03-28 DIAGNOSIS — Z8701 Personal history of pneumonia (recurrent): Secondary | ICD-10-CM | POA: Diagnosis not present

## 2019-03-28 DIAGNOSIS — J984 Other disorders of lung: Secondary | ICD-10-CM | POA: Diagnosis not present

## 2019-03-29 DIAGNOSIS — R9389 Abnormal findings on diagnostic imaging of other specified body structures: Secondary | ICD-10-CM | POA: Diagnosis not present

## 2019-03-29 DIAGNOSIS — R05 Cough: Secondary | ICD-10-CM | POA: Diagnosis not present

## 2019-03-29 DIAGNOSIS — Z20828 Contact with and (suspected) exposure to other viral communicable diseases: Secondary | ICD-10-CM | POA: Diagnosis not present

## 2019-03-29 DIAGNOSIS — R0989 Other specified symptoms and signs involving the circulatory and respiratory systems: Secondary | ICD-10-CM | POA: Diagnosis not present

## 2019-03-29 DIAGNOSIS — U071 COVID-19: Secondary | ICD-10-CM | POA: Diagnosis not present

## 2019-03-30 DIAGNOSIS — R05 Cough: Secondary | ICD-10-CM | POA: Diagnosis not present

## 2019-03-30 DIAGNOSIS — J189 Pneumonia, unspecified organism: Secondary | ICD-10-CM | POA: Diagnosis not present

## 2019-04-04 DIAGNOSIS — J988 Other specified respiratory disorders: Secondary | ICD-10-CM | POA: Diagnosis not present

## 2019-04-04 DIAGNOSIS — R05 Cough: Secondary | ICD-10-CM | POA: Diagnosis not present

## 2019-04-04 DIAGNOSIS — B349 Viral infection, unspecified: Secondary | ICD-10-CM | POA: Diagnosis not present

## 2019-04-05 DIAGNOSIS — U071 COVID-19: Secondary | ICD-10-CM | POA: Diagnosis not present

## 2019-04-05 DIAGNOSIS — R05 Cough: Secondary | ICD-10-CM | POA: Diagnosis not present

## 2019-04-06 DIAGNOSIS — U071 COVID-19: Secondary | ICD-10-CM | POA: Diagnosis not present

## 2019-04-11 DIAGNOSIS — J189 Pneumonia, unspecified organism: Secondary | ICD-10-CM | POA: Diagnosis not present

## 2019-04-11 DIAGNOSIS — Z7952 Long term (current) use of systemic steroids: Secondary | ICD-10-CM | POA: Diagnosis not present

## 2019-04-11 DIAGNOSIS — R7989 Other specified abnormal findings of blood chemistry: Secondary | ICD-10-CM | POA: Diagnosis not present

## 2019-04-11 DIAGNOSIS — D72829 Elevated white blood cell count, unspecified: Secondary | ICD-10-CM | POA: Diagnosis not present

## 2019-04-11 DIAGNOSIS — E8809 Other disorders of plasma-protein metabolism, not elsewhere classified: Secondary | ICD-10-CM | POA: Diagnosis not present

## 2019-04-12 DIAGNOSIS — U071 COVID-19: Secondary | ICD-10-CM | POA: Diagnosis not present

## 2019-04-12 DIAGNOSIS — Z8701 Personal history of pneumonia (recurrent): Secondary | ICD-10-CM | POA: Diagnosis not present

## 2019-04-12 DIAGNOSIS — Z7901 Long term (current) use of anticoagulants: Secondary | ICD-10-CM | POA: Diagnosis not present

## 2019-04-13 DIAGNOSIS — Z7901 Long term (current) use of anticoagulants: Secondary | ICD-10-CM | POA: Diagnosis not present

## 2019-04-13 DIAGNOSIS — Z79899 Other long term (current) drug therapy: Secondary | ICD-10-CM | POA: Diagnosis not present

## 2019-04-14 DIAGNOSIS — E8809 Other disorders of plasma-protein metabolism, not elsewhere classified: Secondary | ICD-10-CM | POA: Diagnosis not present

## 2019-04-14 DIAGNOSIS — Z79899 Other long term (current) drug therapy: Secondary | ICD-10-CM | POA: Diagnosis not present

## 2019-04-14 DIAGNOSIS — B349 Viral infection, unspecified: Secondary | ICD-10-CM | POA: Diagnosis not present

## 2019-04-14 DIAGNOSIS — R630 Anorexia: Secondary | ICD-10-CM | POA: Diagnosis not present

## 2019-04-20 DIAGNOSIS — I10 Essential (primary) hypertension: Secondary | ICD-10-CM | POA: Diagnosis not present

## 2019-04-20 DIAGNOSIS — Z8701 Personal history of pneumonia (recurrent): Secondary | ICD-10-CM | POA: Diagnosis not present

## 2019-05-02 DIAGNOSIS — Z79899 Other long term (current) drug therapy: Secondary | ICD-10-CM | POA: Diagnosis not present

## 2019-05-02 DIAGNOSIS — E119 Type 2 diabetes mellitus without complications: Secondary | ICD-10-CM | POA: Diagnosis not present

## 2019-05-02 DIAGNOSIS — R739 Hyperglycemia, unspecified: Secondary | ICD-10-CM | POA: Diagnosis not present

## 2019-05-02 DIAGNOSIS — K219 Gastro-esophageal reflux disease without esophagitis: Secondary | ICD-10-CM | POA: Diagnosis not present

## 2019-05-04 DIAGNOSIS — Z79899 Other long term (current) drug therapy: Secondary | ICD-10-CM | POA: Diagnosis not present

## 2019-05-04 DIAGNOSIS — F0390 Unspecified dementia without behavioral disturbance: Secondary | ICD-10-CM | POA: Diagnosis not present

## 2019-05-04 DIAGNOSIS — M6281 Muscle weakness (generalized): Secondary | ICD-10-CM | POA: Diagnosis not present

## 2019-05-04 DIAGNOSIS — E08311 Diabetes mellitus due to underlying condition with unspecified diabetic retinopathy with macular edema: Secondary | ICD-10-CM | POA: Diagnosis not present

## 2019-05-05 DIAGNOSIS — E119 Type 2 diabetes mellitus without complications: Secondary | ICD-10-CM | POA: Diagnosis not present

## 2019-05-05 DIAGNOSIS — M79605 Pain in left leg: Secondary | ICD-10-CM | POA: Diagnosis not present

## 2019-05-05 DIAGNOSIS — R739 Hyperglycemia, unspecified: Secondary | ICD-10-CM | POA: Diagnosis not present

## 2019-05-05 DIAGNOSIS — Z79899 Other long term (current) drug therapy: Secondary | ICD-10-CM | POA: Diagnosis not present

## 2019-05-25 DIAGNOSIS — I10 Essential (primary) hypertension: Secondary | ICD-10-CM | POA: Diagnosis not present

## 2019-05-25 DIAGNOSIS — Z9181 History of falling: Secondary | ICD-10-CM | POA: Diagnosis not present

## 2019-05-25 DIAGNOSIS — M6281 Muscle weakness (generalized): Secondary | ICD-10-CM | POA: Diagnosis not present

## 2019-05-25 DIAGNOSIS — F0390 Unspecified dementia without behavioral disturbance: Secondary | ICD-10-CM | POA: Diagnosis not present

## 2019-06-30 DIAGNOSIS — G629 Polyneuropathy, unspecified: Secondary | ICD-10-CM | POA: Diagnosis not present

## 2019-06-30 DIAGNOSIS — M79605 Pain in left leg: Secondary | ICD-10-CM | POA: Diagnosis not present

## 2019-06-30 DIAGNOSIS — Z79899 Other long term (current) drug therapy: Secondary | ICD-10-CM | POA: Diagnosis not present

## 2019-07-12 DIAGNOSIS — Z20828 Contact with and (suspected) exposure to other viral communicable diseases: Secondary | ICD-10-CM | POA: Diagnosis not present

## 2019-07-13 DIAGNOSIS — Z89511 Acquired absence of right leg below knee: Secondary | ICD-10-CM | POA: Diagnosis not present

## 2019-07-13 DIAGNOSIS — G629 Polyneuropathy, unspecified: Secondary | ICD-10-CM | POA: Diagnosis not present

## 2019-07-13 DIAGNOSIS — M6281 Muscle weakness (generalized): Secondary | ICD-10-CM | POA: Diagnosis not present

## 2019-07-13 DIAGNOSIS — Z9181 History of falling: Secondary | ICD-10-CM | POA: Diagnosis not present

## 2019-07-18 DIAGNOSIS — M25562 Pain in left knee: Secondary | ICD-10-CM | POA: Diagnosis not present

## 2019-07-18 DIAGNOSIS — F418 Other specified anxiety disorders: Secondary | ICD-10-CM | POA: Diagnosis not present

## 2019-07-18 DIAGNOSIS — Z9181 History of falling: Secondary | ICD-10-CM | POA: Diagnosis not present

## 2019-07-19 DIAGNOSIS — M11262 Other chondrocalcinosis, left knee: Secondary | ICD-10-CM | POA: Diagnosis not present

## 2019-07-19 DIAGNOSIS — Z20828 Contact with and (suspected) exposure to other viral communicable diseases: Secondary | ICD-10-CM | POA: Diagnosis not present

## 2019-07-20 DIAGNOSIS — M25562 Pain in left knee: Secondary | ICD-10-CM | POA: Diagnosis not present

## 2019-07-20 DIAGNOSIS — M1990 Unspecified osteoarthritis, unspecified site: Secondary | ICD-10-CM | POA: Diagnosis not present

## 2019-07-25 DIAGNOSIS — R296 Repeated falls: Secondary | ICD-10-CM | POA: Diagnosis not present

## 2019-07-25 DIAGNOSIS — N289 Disorder of kidney and ureter, unspecified: Secondary | ICD-10-CM | POA: Diagnosis not present

## 2019-07-25 DIAGNOSIS — R410 Disorientation, unspecified: Secondary | ICD-10-CM | POA: Diagnosis not present

## 2019-07-25 DIAGNOSIS — R4702 Dysphasia: Secondary | ICD-10-CM | POA: Diagnosis not present

## 2019-07-26 DIAGNOSIS — Z20828 Contact with and (suspected) exposure to other viral communicable diseases: Secondary | ICD-10-CM | POA: Diagnosis not present

## 2019-07-27 DIAGNOSIS — M6281 Muscle weakness (generalized): Secondary | ICD-10-CM | POA: Diagnosis not present

## 2019-07-27 DIAGNOSIS — R296 Repeated falls: Secondary | ICD-10-CM | POA: Diagnosis not present

## 2019-07-27 DIAGNOSIS — F0390 Unspecified dementia without behavioral disturbance: Secondary | ICD-10-CM | POA: Diagnosis not present

## 2019-08-02 DIAGNOSIS — Z20828 Contact with and (suspected) exposure to other viral communicable diseases: Secondary | ICD-10-CM | POA: Diagnosis not present

## 2019-08-05 DIAGNOSIS — Z01812 Encounter for preprocedural laboratory examination: Secondary | ICD-10-CM | POA: Diagnosis not present

## 2019-08-05 DIAGNOSIS — Z20828 Contact with and (suspected) exposure to other viral communicable diseases: Secondary | ICD-10-CM | POA: Diagnosis not present

## 2019-08-05 DIAGNOSIS — S80812D Abrasion, left lower leg, subsequent encounter: Secondary | ICD-10-CM | POA: Diagnosis not present

## 2019-08-09 DIAGNOSIS — Z20828 Contact with and (suspected) exposure to other viral communicable diseases: Secondary | ICD-10-CM | POA: Diagnosis not present

## 2019-08-09 DIAGNOSIS — Z8673 Personal history of transient ischemic attack (TIA), and cerebral infarction without residual deficits: Secondary | ICD-10-CM | POA: Diagnosis not present

## 2019-08-09 DIAGNOSIS — R42 Dizziness and giddiness: Secondary | ICD-10-CM | POA: Diagnosis not present

## 2019-08-09 DIAGNOSIS — Z9181 History of falling: Secondary | ICD-10-CM | POA: Diagnosis not present

## 2019-08-16 DIAGNOSIS — Z20828 Contact with and (suspected) exposure to other viral communicable diseases: Secondary | ICD-10-CM | POA: Diagnosis not present

## 2019-08-22 DIAGNOSIS — Z20828 Contact with and (suspected) exposure to other viral communicable diseases: Secondary | ICD-10-CM | POA: Diagnosis not present

## 2019-08-24 DIAGNOSIS — R443 Hallucinations, unspecified: Secondary | ICD-10-CM | POA: Diagnosis not present

## 2019-08-24 DIAGNOSIS — F0390 Unspecified dementia without behavioral disturbance: Secondary | ICD-10-CM | POA: Diagnosis not present

## 2019-08-24 DIAGNOSIS — Z8619 Personal history of other infectious and parasitic diseases: Secondary | ICD-10-CM | POA: Diagnosis not present

## 2019-08-29 DIAGNOSIS — Z20828 Contact with and (suspected) exposure to other viral communicable diseases: Secondary | ICD-10-CM | POA: Diagnosis not present

## 2019-09-02 DIAGNOSIS — G2581 Restless legs syndrome: Secondary | ICD-10-CM | POA: Diagnosis not present

## 2019-09-02 DIAGNOSIS — I129 Hypertensive chronic kidney disease with stage 1 through stage 4 chronic kidney disease, or unspecified chronic kidney disease: Secondary | ICD-10-CM | POA: Diagnosis not present

## 2019-09-02 DIAGNOSIS — M6281 Muscle weakness (generalized): Secondary | ICD-10-CM | POA: Diagnosis not present

## 2019-09-02 DIAGNOSIS — G9341 Metabolic encephalopathy: Secondary | ICD-10-CM | POA: Diagnosis not present

## 2019-09-02 DIAGNOSIS — R4182 Altered mental status, unspecified: Secondary | ICD-10-CM | POA: Diagnosis not present

## 2019-09-02 DIAGNOSIS — Z03818 Encounter for observation for suspected exposure to other biological agents ruled out: Secondary | ICD-10-CM | POA: Diagnosis not present

## 2019-09-02 DIAGNOSIS — R1110 Vomiting, unspecified: Secondary | ICD-10-CM | POA: Diagnosis not present

## 2019-09-02 DIAGNOSIS — E1122 Type 2 diabetes mellitus with diabetic chronic kidney disease: Secondary | ICD-10-CM | POA: Diagnosis not present

## 2019-09-02 DIAGNOSIS — I2510 Atherosclerotic heart disease of native coronary artery without angina pectoris: Secondary | ICD-10-CM | POA: Diagnosis not present

## 2019-09-02 DIAGNOSIS — N390 Urinary tract infection, site not specified: Secondary | ICD-10-CM | POA: Diagnosis not present

## 2019-09-02 DIAGNOSIS — S32000A Wedge compression fracture of unspecified lumbar vertebra, initial encounter for closed fracture: Secondary | ICD-10-CM | POA: Diagnosis not present

## 2019-09-02 DIAGNOSIS — W19XXXA Unspecified fall, initial encounter: Secondary | ICD-10-CM | POA: Diagnosis not present

## 2019-09-02 DIAGNOSIS — R509 Fever, unspecified: Secondary | ICD-10-CM | POA: Diagnosis not present

## 2019-09-02 DIAGNOSIS — M545 Low back pain: Secondary | ICD-10-CM | POA: Diagnosis not present

## 2019-09-02 DIAGNOSIS — F0390 Unspecified dementia without behavioral disturbance: Secondary | ICD-10-CM | POA: Diagnosis not present

## 2019-09-02 DIAGNOSIS — R519 Headache, unspecified: Secondary | ICD-10-CM | POA: Diagnosis not present

## 2019-09-02 DIAGNOSIS — Z20828 Contact with and (suspected) exposure to other viral communicable diseases: Secondary | ICD-10-CM | POA: Diagnosis not present

## 2019-09-02 DIAGNOSIS — S13131A Dislocation of C2/C3 cervical vertebrae, initial encounter: Secondary | ICD-10-CM | POA: Diagnosis not present

## 2019-09-02 DIAGNOSIS — S161XXA Strain of muscle, fascia and tendon at neck level, initial encounter: Secondary | ICD-10-CM | POA: Diagnosis not present

## 2019-09-02 DIAGNOSIS — J189 Pneumonia, unspecified organism: Secondary | ICD-10-CM | POA: Diagnosis not present

## 2019-09-02 DIAGNOSIS — M546 Pain in thoracic spine: Secondary | ICD-10-CM | POA: Diagnosis not present

## 2019-09-02 DIAGNOSIS — S32009A Unspecified fracture of unspecified lumbar vertebra, initial encounter for closed fracture: Secondary | ICD-10-CM | POA: Diagnosis not present

## 2019-09-02 DIAGNOSIS — S13100A Subluxation of unspecified cervical vertebrae, initial encounter: Secondary | ICD-10-CM | POA: Diagnosis not present

## 2019-09-02 DIAGNOSIS — G9340 Encephalopathy, unspecified: Secondary | ICD-10-CM | POA: Diagnosis not present

## 2019-09-02 DIAGNOSIS — N189 Chronic kidney disease, unspecified: Secondary | ICD-10-CM | POA: Diagnosis not present

## 2019-09-02 DIAGNOSIS — Z7189 Other specified counseling: Secondary | ICD-10-CM | POA: Diagnosis not present

## 2019-09-02 DIAGNOSIS — M4856XA Collapsed vertebra, not elsewhere classified, lumbar region, initial encounter for fracture: Secondary | ICD-10-CM | POA: Diagnosis not present

## 2019-09-02 DIAGNOSIS — I10 Essential (primary) hypertension: Secondary | ICD-10-CM | POA: Diagnosis not present

## 2019-09-05 DIAGNOSIS — N189 Chronic kidney disease, unspecified: Secondary | ICD-10-CM | POA: Diagnosis not present

## 2019-09-05 DIAGNOSIS — Z7189 Other specified counseling: Secondary | ICD-10-CM | POA: Diagnosis not present

## 2019-09-05 DIAGNOSIS — S13100A Subluxation of unspecified cervical vertebrae, initial encounter: Secondary | ICD-10-CM | POA: Diagnosis not present

## 2019-09-05 DIAGNOSIS — E119 Type 2 diabetes mellitus without complications: Secondary | ICD-10-CM | POA: Diagnosis not present

## 2019-09-05 DIAGNOSIS — K219 Gastro-esophageal reflux disease without esophagitis: Secondary | ICD-10-CM | POA: Diagnosis not present

## 2019-09-05 DIAGNOSIS — Z20828 Contact with and (suspected) exposure to other viral communicable diseases: Secondary | ICD-10-CM | POA: Diagnosis not present

## 2019-09-05 DIAGNOSIS — M6281 Muscle weakness (generalized): Secondary | ICD-10-CM | POA: Diagnosis not present

## 2019-09-05 DIAGNOSIS — S32000A Wedge compression fracture of unspecified lumbar vertebra, initial encounter for closed fracture: Secondary | ICD-10-CM | POA: Diagnosis not present

## 2019-09-05 DIAGNOSIS — R4182 Altered mental status, unspecified: Secondary | ICD-10-CM | POA: Diagnosis not present

## 2019-09-05 DIAGNOSIS — Z79899 Other long term (current) drug therapy: Secondary | ICD-10-CM | POA: Diagnosis not present

## 2019-09-05 DIAGNOSIS — G8929 Other chronic pain: Secondary | ICD-10-CM | POA: Diagnosis not present

## 2019-09-05 DIAGNOSIS — Z20822 Contact with and (suspected) exposure to covid-19: Secondary | ICD-10-CM | POA: Diagnosis not present

## 2019-09-05 DIAGNOSIS — S32009A Unspecified fracture of unspecified lumbar vertebra, initial encounter for closed fracture: Secondary | ICD-10-CM | POA: Diagnosis not present

## 2019-09-05 DIAGNOSIS — W19XXXA Unspecified fall, initial encounter: Secondary | ICD-10-CM | POA: Diagnosis not present

## 2019-09-05 DIAGNOSIS — E1122 Type 2 diabetes mellitus with diabetic chronic kidney disease: Secondary | ICD-10-CM | POA: Diagnosis not present

## 2019-09-05 DIAGNOSIS — J189 Pneumonia, unspecified organism: Secondary | ICD-10-CM | POA: Diagnosis not present

## 2019-09-05 DIAGNOSIS — I10 Essential (primary) hypertension: Secondary | ICD-10-CM | POA: Diagnosis not present

## 2019-09-05 DIAGNOSIS — G9340 Encephalopathy, unspecified: Secondary | ICD-10-CM | POA: Diagnosis not present

## 2019-09-07 DIAGNOSIS — K219 Gastro-esophageal reflux disease without esophagitis: Secondary | ICD-10-CM | POA: Diagnosis not present

## 2019-09-07 DIAGNOSIS — E119 Type 2 diabetes mellitus without complications: Secondary | ICD-10-CM | POA: Diagnosis not present

## 2019-09-07 DIAGNOSIS — I10 Essential (primary) hypertension: Secondary | ICD-10-CM | POA: Diagnosis not present

## 2019-09-07 DIAGNOSIS — J189 Pneumonia, unspecified organism: Secondary | ICD-10-CM | POA: Diagnosis not present

## 2019-09-19 DIAGNOSIS — Z79899 Other long term (current) drug therapy: Secondary | ICD-10-CM | POA: Diagnosis not present

## 2019-09-19 DIAGNOSIS — K219 Gastro-esophageal reflux disease without esophagitis: Secondary | ICD-10-CM | POA: Diagnosis not present

## 2019-09-19 DIAGNOSIS — G8929 Other chronic pain: Secondary | ICD-10-CM | POA: Diagnosis not present

## 2019-09-22 DIAGNOSIS — G8929 Other chronic pain: Secondary | ICD-10-CM | POA: Diagnosis not present

## 2019-09-22 DIAGNOSIS — Z79899 Other long term (current) drug therapy: Secondary | ICD-10-CM | POA: Diagnosis not present

## 2019-09-26 DIAGNOSIS — M549 Dorsalgia, unspecified: Secondary | ICD-10-CM | POA: Diagnosis not present

## 2019-09-26 DIAGNOSIS — M542 Cervicalgia: Secondary | ICD-10-CM | POA: Diagnosis not present

## 2019-09-26 DIAGNOSIS — M47814 Spondylosis without myelopathy or radiculopathy, thoracic region: Secondary | ICD-10-CM | POA: Diagnosis not present

## 2019-09-26 DIAGNOSIS — W19XXXA Unspecified fall, initial encounter: Secondary | ICD-10-CM | POA: Diagnosis not present

## 2019-09-26 DIAGNOSIS — R2681 Unsteadiness on feet: Secondary | ICD-10-CM | POA: Diagnosis not present

## 2019-09-26 DIAGNOSIS — M47812 Spondylosis without myelopathy or radiculopathy, cervical region: Secondary | ICD-10-CM | POA: Diagnosis not present

## 2019-09-27 DIAGNOSIS — Z20822 Contact with and (suspected) exposure to covid-19: Secondary | ICD-10-CM | POA: Diagnosis not present

## 2019-09-28 DIAGNOSIS — M47812 Spondylosis without myelopathy or radiculopathy, cervical region: Secondary | ICD-10-CM | POA: Diagnosis not present

## 2019-09-28 DIAGNOSIS — M47814 Spondylosis without myelopathy or radiculopathy, thoracic region: Secondary | ICD-10-CM | POA: Diagnosis not present

## 2019-09-29 DIAGNOSIS — Z79899 Other long term (current) drug therapy: Secondary | ICD-10-CM | POA: Diagnosis not present

## 2019-09-29 DIAGNOSIS — M549 Dorsalgia, unspecified: Secondary | ICD-10-CM | POA: Diagnosis not present

## 2019-09-29 DIAGNOSIS — M542 Cervicalgia: Secondary | ICD-10-CM | POA: Diagnosis not present

## 2019-09-29 DIAGNOSIS — Z9181 History of falling: Secondary | ICD-10-CM | POA: Diagnosis not present

## 2019-10-04 DIAGNOSIS — Z20822 Contact with and (suspected) exposure to covid-19: Secondary | ICD-10-CM | POA: Diagnosis not present

## 2019-10-12 DIAGNOSIS — Z20828 Contact with and (suspected) exposure to other viral communicable diseases: Secondary | ICD-10-CM | POA: Diagnosis not present

## 2019-10-14 DIAGNOSIS — M6281 Muscle weakness (generalized): Secondary | ICD-10-CM | POA: Diagnosis not present

## 2019-10-17 DIAGNOSIS — Z79899 Other long term (current) drug therapy: Secondary | ICD-10-CM | POA: Diagnosis not present

## 2019-10-17 DIAGNOSIS — G8929 Other chronic pain: Secondary | ICD-10-CM | POA: Diagnosis not present

## 2019-10-18 DIAGNOSIS — Z20822 Contact with and (suspected) exposure to covid-19: Secondary | ICD-10-CM | POA: Diagnosis not present

## 2019-10-19 DIAGNOSIS — G8929 Other chronic pain: Secondary | ICD-10-CM | POA: Diagnosis not present

## 2019-10-25 DIAGNOSIS — Z20822 Contact with and (suspected) exposure to covid-19: Secondary | ICD-10-CM | POA: Diagnosis not present

## 2019-10-26 DIAGNOSIS — J309 Allergic rhinitis, unspecified: Secondary | ICD-10-CM | POA: Diagnosis not present

## 2019-11-01 DIAGNOSIS — Z20822 Contact with and (suspected) exposure to covid-19: Secondary | ICD-10-CM | POA: Diagnosis not present

## 2019-11-04 DIAGNOSIS — E08311 Diabetes mellitus due to underlying condition with unspecified diabetic retinopathy with macular edema: Secondary | ICD-10-CM | POA: Diagnosis not present

## 2019-11-04 DIAGNOSIS — E119 Type 2 diabetes mellitus without complications: Secondary | ICD-10-CM | POA: Diagnosis not present

## 2019-11-07 DIAGNOSIS — R7309 Other abnormal glucose: Secondary | ICD-10-CM | POA: Diagnosis not present

## 2019-11-07 DIAGNOSIS — R410 Disorientation, unspecified: Secondary | ICD-10-CM | POA: Diagnosis not present

## 2019-11-07 DIAGNOSIS — E119 Type 2 diabetes mellitus without complications: Secondary | ICD-10-CM | POA: Diagnosis not present

## 2019-11-07 DIAGNOSIS — Z79899 Other long term (current) drug therapy: Secondary | ICD-10-CM | POA: Diagnosis not present

## 2019-11-08 DIAGNOSIS — Z20828 Contact with and (suspected) exposure to other viral communicable diseases: Secondary | ICD-10-CM | POA: Diagnosis not present

## 2019-11-09 DIAGNOSIS — R4182 Altered mental status, unspecified: Secondary | ICD-10-CM | POA: Diagnosis not present

## 2019-11-10 ENCOUNTER — Encounter: Payer: Self-pay | Admitting: Cardiovascular Disease

## 2019-11-10 DIAGNOSIS — R944 Abnormal results of kidney function studies: Secondary | ICD-10-CM | POA: Diagnosis not present

## 2019-11-10 DIAGNOSIS — R7989 Other specified abnormal findings of blood chemistry: Secondary | ICD-10-CM | POA: Diagnosis not present

## 2019-11-10 DIAGNOSIS — R410 Disorientation, unspecified: Secondary | ICD-10-CM | POA: Diagnosis not present

## 2019-11-14 DIAGNOSIS — G8929 Other chronic pain: Secondary | ICD-10-CM | POA: Diagnosis not present

## 2019-11-14 DIAGNOSIS — F22 Delusional disorders: Secondary | ICD-10-CM | POA: Diagnosis not present

## 2019-11-14 DIAGNOSIS — Z79899 Other long term (current) drug therapy: Secondary | ICD-10-CM | POA: Diagnosis not present

## 2019-11-14 DIAGNOSIS — R410 Disorientation, unspecified: Secondary | ICD-10-CM | POA: Diagnosis not present

## 2019-11-15 DIAGNOSIS — Z20828 Contact with and (suspected) exposure to other viral communicable diseases: Secondary | ICD-10-CM | POA: Diagnosis not present

## 2019-11-16 DIAGNOSIS — F22 Delusional disorders: Secondary | ICD-10-CM | POA: Diagnosis not present

## 2019-11-16 DIAGNOSIS — F0390 Unspecified dementia without behavioral disturbance: Secondary | ICD-10-CM | POA: Diagnosis not present

## 2019-11-22 DIAGNOSIS — N183 Chronic kidney disease, stage 3 unspecified: Secondary | ICD-10-CM | POA: Diagnosis not present

## 2019-11-22 DIAGNOSIS — Z515 Encounter for palliative care: Secondary | ICD-10-CM | POA: Diagnosis not present

## 2019-11-22 DIAGNOSIS — F0280 Dementia in other diseases classified elsewhere without behavioral disturbance: Secondary | ICD-10-CM | POA: Diagnosis not present

## 2019-11-22 DIAGNOSIS — Z20828 Contact with and (suspected) exposure to other viral communicable diseases: Secondary | ICD-10-CM | POA: Diagnosis not present

## 2019-11-22 DIAGNOSIS — I10 Essential (primary) hypertension: Secondary | ICD-10-CM | POA: Diagnosis not present

## 2019-11-22 DIAGNOSIS — M6281 Muscle weakness (generalized): Secondary | ICD-10-CM | POA: Diagnosis not present

## 2019-11-22 DIAGNOSIS — E119 Type 2 diabetes mellitus without complications: Secondary | ICD-10-CM | POA: Diagnosis not present

## 2019-11-23 DIAGNOSIS — R52 Pain, unspecified: Secondary | ICD-10-CM | POA: Diagnosis not present

## 2019-11-23 DIAGNOSIS — F0391 Unspecified dementia with behavioral disturbance: Secondary | ICD-10-CM | POA: Diagnosis not present

## 2019-11-23 DIAGNOSIS — M6281 Muscle weakness (generalized): Secondary | ICD-10-CM | POA: Diagnosis not present

## 2019-11-23 DIAGNOSIS — F22 Delusional disorders: Secondary | ICD-10-CM | POA: Diagnosis not present

## 2019-11-23 DIAGNOSIS — Z79899 Other long term (current) drug therapy: Secondary | ICD-10-CM | POA: Diagnosis not present

## 2019-11-24 DIAGNOSIS — M6281 Muscle weakness (generalized): Secondary | ICD-10-CM | POA: Diagnosis not present

## 2019-11-28 DIAGNOSIS — M6281 Muscle weakness (generalized): Secondary | ICD-10-CM | POA: Diagnosis not present

## 2019-11-29 DIAGNOSIS — M6281 Muscle weakness (generalized): Secondary | ICD-10-CM | POA: Diagnosis not present

## 2019-11-29 DIAGNOSIS — Z20828 Contact with and (suspected) exposure to other viral communicable diseases: Secondary | ICD-10-CM | POA: Diagnosis not present

## 2019-11-30 DIAGNOSIS — F22 Delusional disorders: Secondary | ICD-10-CM | POA: Diagnosis not present

## 2019-11-30 DIAGNOSIS — M6281 Muscle weakness (generalized): Secondary | ICD-10-CM | POA: Diagnosis not present

## 2019-11-30 DIAGNOSIS — F0390 Unspecified dementia without behavioral disturbance: Secondary | ICD-10-CM | POA: Diagnosis not present

## 2019-12-01 DIAGNOSIS — F29 Unspecified psychosis not due to a substance or known physiological condition: Secondary | ICD-10-CM | POA: Diagnosis not present

## 2019-12-01 DIAGNOSIS — R443 Hallucinations, unspecified: Secondary | ICD-10-CM | POA: Diagnosis not present

## 2019-12-01 DIAGNOSIS — M6281 Muscle weakness (generalized): Secondary | ICD-10-CM | POA: Diagnosis not present

## 2019-12-01 DIAGNOSIS — F22 Delusional disorders: Secondary | ICD-10-CM | POA: Diagnosis not present

## 2019-12-01 DIAGNOSIS — G2581 Restless legs syndrome: Secondary | ICD-10-CM | POA: Diagnosis not present

## 2019-12-02 DIAGNOSIS — R946 Abnormal results of thyroid function studies: Secondary | ICD-10-CM | POA: Diagnosis not present

## 2019-12-02 DIAGNOSIS — Z79899 Other long term (current) drug therapy: Secondary | ICD-10-CM | POA: Diagnosis not present

## 2019-12-02 DIAGNOSIS — R4182 Altered mental status, unspecified: Secondary | ICD-10-CM | POA: Diagnosis not present

## 2019-12-02 DIAGNOSIS — E08319 Diabetes mellitus due to underlying condition with unspecified diabetic retinopathy without macular edema: Secondary | ICD-10-CM | POA: Diagnosis not present

## 2019-12-02 DIAGNOSIS — E08311 Diabetes mellitus due to underlying condition with unspecified diabetic retinopathy with macular edema: Secondary | ICD-10-CM | POA: Diagnosis not present

## 2019-12-02 DIAGNOSIS — R6889 Other general symptoms and signs: Secondary | ICD-10-CM | POA: Diagnosis not present

## 2019-12-02 DIAGNOSIS — Z7901 Long term (current) use of anticoagulants: Secondary | ICD-10-CM | POA: Diagnosis not present

## 2019-12-02 DIAGNOSIS — M6281 Muscle weakness (generalized): Secondary | ICD-10-CM | POA: Diagnosis not present

## 2019-12-05 DIAGNOSIS — R410 Disorientation, unspecified: Secondary | ICD-10-CM | POA: Diagnosis not present

## 2019-12-05 DIAGNOSIS — M6281 Muscle weakness (generalized): Secondary | ICD-10-CM | POA: Diagnosis not present

## 2019-12-05 DIAGNOSIS — F22 Delusional disorders: Secondary | ICD-10-CM | POA: Diagnosis not present

## 2019-12-05 DIAGNOSIS — Z79899 Other long term (current) drug therapy: Secondary | ICD-10-CM | POA: Diagnosis not present

## 2019-12-06 DIAGNOSIS — Z20828 Contact with and (suspected) exposure to other viral communicable diseases: Secondary | ICD-10-CM | POA: Diagnosis not present

## 2019-12-13 DIAGNOSIS — Z20828 Contact with and (suspected) exposure to other viral communicable diseases: Secondary | ICD-10-CM | POA: Diagnosis not present

## 2019-12-19 DIAGNOSIS — Z79899 Other long term (current) drug therapy: Secondary | ICD-10-CM | POA: Diagnosis not present

## 2019-12-19 DIAGNOSIS — G2581 Restless legs syndrome: Secondary | ICD-10-CM | POA: Diagnosis not present

## 2019-12-19 DIAGNOSIS — R443 Hallucinations, unspecified: Secondary | ICD-10-CM | POA: Diagnosis not present

## 2019-12-19 DIAGNOSIS — R410 Disorientation, unspecified: Secondary | ICD-10-CM | POA: Diagnosis not present

## 2019-12-20 DIAGNOSIS — Z20828 Contact with and (suspected) exposure to other viral communicable diseases: Secondary | ICD-10-CM | POA: Diagnosis not present

## 2019-12-21 DIAGNOSIS — R6889 Other general symptoms and signs: Secondary | ICD-10-CM | POA: Diagnosis not present

## 2019-12-22 DIAGNOSIS — F22 Delusional disorders: Secondary | ICD-10-CM | POA: Diagnosis not present

## 2019-12-22 DIAGNOSIS — R7989 Other specified abnormal findings of blood chemistry: Secondary | ICD-10-CM | POA: Diagnosis not present

## 2019-12-22 DIAGNOSIS — Z79899 Other long term (current) drug therapy: Secondary | ICD-10-CM | POA: Diagnosis not present

## 2019-12-22 DIAGNOSIS — G2581 Restless legs syndrome: Secondary | ICD-10-CM | POA: Diagnosis not present

## 2019-12-26 DIAGNOSIS — R7989 Other specified abnormal findings of blood chemistry: Secondary | ICD-10-CM | POA: Diagnosis not present

## 2019-12-26 DIAGNOSIS — E039 Hypothyroidism, unspecified: Secondary | ICD-10-CM | POA: Diagnosis not present

## 2019-12-26 DIAGNOSIS — R768 Other specified abnormal immunological findings in serum: Secondary | ICD-10-CM | POA: Diagnosis not present

## 2019-12-27 DIAGNOSIS — Z20828 Contact with and (suspected) exposure to other viral communicable diseases: Secondary | ICD-10-CM | POA: Diagnosis not present

## 2019-12-28 DIAGNOSIS — H524 Presbyopia: Secondary | ICD-10-CM | POA: Diagnosis not present

## 2019-12-28 DIAGNOSIS — Z794 Long term (current) use of insulin: Secondary | ICD-10-CM | POA: Diagnosis not present

## 2019-12-28 DIAGNOSIS — Z7984 Long term (current) use of oral hypoglycemic drugs: Secondary | ICD-10-CM | POA: Diagnosis not present

## 2019-12-28 DIAGNOSIS — Z961 Presence of intraocular lens: Secondary | ICD-10-CM | POA: Diagnosis not present

## 2019-12-28 DIAGNOSIS — E119 Type 2 diabetes mellitus without complications: Secondary | ICD-10-CM | POA: Diagnosis not present

## 2020-01-02 DIAGNOSIS — R4182 Altered mental status, unspecified: Secondary | ICD-10-CM | POA: Diagnosis not present

## 2020-01-02 DIAGNOSIS — F0150 Vascular dementia without behavioral disturbance: Secondary | ICD-10-CM | POA: Diagnosis not present

## 2020-01-03 DIAGNOSIS — F0280 Dementia in other diseases classified elsewhere without behavioral disturbance: Secondary | ICD-10-CM | POA: Diagnosis not present

## 2020-01-03 DIAGNOSIS — I10 Essential (primary) hypertension: Secondary | ICD-10-CM | POA: Diagnosis not present

## 2020-01-03 DIAGNOSIS — N183 Chronic kidney disease, stage 3 unspecified: Secondary | ICD-10-CM | POA: Diagnosis not present

## 2020-01-03 DIAGNOSIS — Z20828 Contact with and (suspected) exposure to other viral communicable diseases: Secondary | ICD-10-CM | POA: Diagnosis not present

## 2020-01-03 DIAGNOSIS — E119 Type 2 diabetes mellitus without complications: Secondary | ICD-10-CM | POA: Diagnosis not present

## 2020-01-03 DIAGNOSIS — Z515 Encounter for palliative care: Secondary | ICD-10-CM | POA: Diagnosis not present

## 2020-01-05 DIAGNOSIS — R54 Age-related physical debility: Secondary | ICD-10-CM | POA: Diagnosis not present

## 2020-01-05 DIAGNOSIS — F0150 Vascular dementia without behavioral disturbance: Secondary | ICD-10-CM | POA: Diagnosis not present

## 2020-01-05 DIAGNOSIS — E032 Hypothyroidism due to medicaments and other exogenous substances: Secondary | ICD-10-CM | POA: Diagnosis not present

## 2020-01-10 DIAGNOSIS — W19XXXA Unspecified fall, initial encounter: Secondary | ICD-10-CM | POA: Diagnosis not present

## 2020-01-10 DIAGNOSIS — G894 Chronic pain syndrome: Secondary | ICD-10-CM | POA: Diagnosis not present

## 2020-01-10 DIAGNOSIS — R54 Age-related physical debility: Secondary | ICD-10-CM | POA: Diagnosis not present

## 2020-01-10 DIAGNOSIS — Z20828 Contact with and (suspected) exposure to other viral communicable diseases: Secondary | ICD-10-CM | POA: Diagnosis not present

## 2020-01-10 DIAGNOSIS — G2581 Restless legs syndrome: Secondary | ICD-10-CM | POA: Diagnosis not present

## 2020-01-11 DIAGNOSIS — R1310 Dysphagia, unspecified: Secondary | ICD-10-CM | POA: Diagnosis not present

## 2020-01-11 DIAGNOSIS — F0150 Vascular dementia without behavioral disturbance: Secondary | ICD-10-CM | POA: Diagnosis not present

## 2020-01-11 DIAGNOSIS — E782 Mixed hyperlipidemia: Secondary | ICD-10-CM | POA: Diagnosis not present

## 2020-01-11 DIAGNOSIS — N182 Chronic kidney disease, stage 2 (mild): Secondary | ICD-10-CM | POA: Diagnosis not present

## 2020-01-11 DIAGNOSIS — E039 Hypothyroidism, unspecified: Secondary | ICD-10-CM | POA: Diagnosis not present

## 2020-01-18 DIAGNOSIS — E1140 Type 2 diabetes mellitus with diabetic neuropathy, unspecified: Secondary | ICD-10-CM | POA: Diagnosis not present

## 2020-01-18 DIAGNOSIS — E1151 Type 2 diabetes mellitus with diabetic peripheral angiopathy without gangrene: Secondary | ICD-10-CM | POA: Diagnosis not present

## 2020-01-18 DIAGNOSIS — M6281 Muscle weakness (generalized): Secondary | ICD-10-CM | POA: Diagnosis not present

## 2020-01-18 DIAGNOSIS — B351 Tinea unguium: Secondary | ICD-10-CM | POA: Diagnosis not present

## 2020-01-19 DIAGNOSIS — M6281 Muscle weakness (generalized): Secondary | ICD-10-CM | POA: Diagnosis not present

## 2020-01-20 DIAGNOSIS — M6281 Muscle weakness (generalized): Secondary | ICD-10-CM | POA: Diagnosis not present

## 2020-01-23 DIAGNOSIS — M6281 Muscle weakness (generalized): Secondary | ICD-10-CM | POA: Diagnosis not present

## 2020-01-24 DIAGNOSIS — M6281 Muscle weakness (generalized): Secondary | ICD-10-CM | POA: Diagnosis not present

## 2020-01-25 DIAGNOSIS — M6281 Muscle weakness (generalized): Secondary | ICD-10-CM | POA: Diagnosis not present

## 2020-01-26 DIAGNOSIS — M6281 Muscle weakness (generalized): Secondary | ICD-10-CM | POA: Diagnosis not present

## 2020-01-27 DIAGNOSIS — M6281 Muscle weakness (generalized): Secondary | ICD-10-CM | POA: Diagnosis not present

## 2020-01-30 DIAGNOSIS — M6281 Muscle weakness (generalized): Secondary | ICD-10-CM | POA: Diagnosis not present

## 2020-01-31 DIAGNOSIS — M6281 Muscle weakness (generalized): Secondary | ICD-10-CM | POA: Diagnosis not present

## 2020-02-01 DIAGNOSIS — M6281 Muscle weakness (generalized): Secondary | ICD-10-CM | POA: Diagnosis not present

## 2020-02-02 DIAGNOSIS — M6281 Muscle weakness (generalized): Secondary | ICD-10-CM | POA: Diagnosis not present

## 2020-02-13 DIAGNOSIS — R1310 Dysphagia, unspecified: Secondary | ICD-10-CM | POA: Diagnosis not present

## 2020-02-13 DIAGNOSIS — F0150 Vascular dementia without behavioral disturbance: Secondary | ICD-10-CM | POA: Diagnosis not present

## 2020-02-13 DIAGNOSIS — K5909 Other constipation: Secondary | ICD-10-CM | POA: Diagnosis not present

## 2020-02-21 DIAGNOSIS — Z515 Encounter for palliative care: Secondary | ICD-10-CM | POA: Diagnosis not present

## 2020-02-21 DIAGNOSIS — E1122 Type 2 diabetes mellitus with diabetic chronic kidney disease: Secondary | ICD-10-CM | POA: Diagnosis not present

## 2020-02-21 DIAGNOSIS — F29 Unspecified psychosis not due to a substance or known physiological condition: Secondary | ICD-10-CM | POA: Diagnosis not present

## 2020-02-21 DIAGNOSIS — I10 Essential (primary) hypertension: Secondary | ICD-10-CM | POA: Diagnosis not present

## 2020-02-21 DIAGNOSIS — F0280 Dementia in other diseases classified elsewhere without behavioral disturbance: Secondary | ICD-10-CM | POA: Diagnosis not present

## 2020-02-22 DIAGNOSIS — Z79899 Other long term (current) drug therapy: Secondary | ICD-10-CM | POA: Diagnosis not present

## 2020-03-07 DIAGNOSIS — N183 Chronic kidney disease, stage 3 unspecified: Secondary | ICD-10-CM | POA: Diagnosis not present

## 2020-03-07 DIAGNOSIS — I739 Peripheral vascular disease, unspecified: Secondary | ICD-10-CM | POA: Diagnosis not present

## 2020-03-07 DIAGNOSIS — E118 Type 2 diabetes mellitus with unspecified complications: Secondary | ICD-10-CM | POA: Diagnosis not present

## 2020-03-07 DIAGNOSIS — L2084 Intrinsic (allergic) eczema: Secondary | ICD-10-CM | POA: Diagnosis not present

## 2020-03-07 DIAGNOSIS — F0391 Unspecified dementia with behavioral disturbance: Secondary | ICD-10-CM | POA: Diagnosis not present

## 2020-03-14 DIAGNOSIS — G894 Chronic pain syndrome: Secondary | ICD-10-CM | POA: Diagnosis not present

## 2020-03-14 DIAGNOSIS — L299 Pruritus, unspecified: Secondary | ICD-10-CM | POA: Diagnosis not present

## 2020-03-29 DIAGNOSIS — R300 Dysuria: Secondary | ICD-10-CM | POA: Diagnosis not present

## 2020-03-29 DIAGNOSIS — G894 Chronic pain syndrome: Secondary | ICD-10-CM | POA: Diagnosis not present

## 2020-03-31 DIAGNOSIS — R300 Dysuria: Secondary | ICD-10-CM | POA: Diagnosis not present

## 2020-04-03 DIAGNOSIS — L84 Corns and callosities: Secondary | ICD-10-CM | POA: Diagnosis not present

## 2020-04-03 DIAGNOSIS — L603 Nail dystrophy: Secondary | ICD-10-CM | POA: Diagnosis not present

## 2020-04-03 DIAGNOSIS — E1151 Type 2 diabetes mellitus with diabetic peripheral angiopathy without gangrene: Secondary | ICD-10-CM | POA: Diagnosis not present

## 2020-04-03 DIAGNOSIS — G894 Chronic pain syndrome: Secondary | ICD-10-CM | POA: Diagnosis not present

## 2020-04-03 DIAGNOSIS — R300 Dysuria: Secondary | ICD-10-CM | POA: Diagnosis not present

## 2020-04-03 DIAGNOSIS — N390 Urinary tract infection, site not specified: Secondary | ICD-10-CM | POA: Diagnosis not present

## 2020-04-04 DIAGNOSIS — Z79899 Other long term (current) drug therapy: Secondary | ICD-10-CM | POA: Diagnosis not present

## 2020-04-06 DIAGNOSIS — Z20828 Contact with and (suspected) exposure to other viral communicable diseases: Secondary | ICD-10-CM | POA: Diagnosis not present

## 2020-04-09 DIAGNOSIS — L853 Xerosis cutis: Secondary | ICD-10-CM | POA: Diagnosis not present

## 2020-04-10 DIAGNOSIS — Z20828 Contact with and (suspected) exposure to other viral communicable diseases: Secondary | ICD-10-CM | POA: Diagnosis not present

## 2020-04-11 DIAGNOSIS — N390 Urinary tract infection, site not specified: Secondary | ICD-10-CM | POA: Diagnosis not present

## 2020-04-12 DIAGNOSIS — L209 Atopic dermatitis, unspecified: Secondary | ICD-10-CM | POA: Diagnosis not present

## 2020-04-12 DIAGNOSIS — L299 Pruritus, unspecified: Secondary | ICD-10-CM | POA: Diagnosis not present

## 2020-04-12 DIAGNOSIS — R300 Dysuria: Secondary | ICD-10-CM | POA: Diagnosis not present

## 2020-04-16 DIAGNOSIS — R21 Rash and other nonspecific skin eruption: Secondary | ICD-10-CM | POA: Diagnosis not present

## 2020-04-16 DIAGNOSIS — L299 Pruritus, unspecified: Secondary | ICD-10-CM | POA: Diagnosis not present

## 2020-04-17 DIAGNOSIS — Z20828 Contact with and (suspected) exposure to other viral communicable diseases: Secondary | ICD-10-CM | POA: Diagnosis not present

## 2020-04-19 DIAGNOSIS — R21 Rash and other nonspecific skin eruption: Secondary | ICD-10-CM | POA: Diagnosis not present

## 2020-04-19 DIAGNOSIS — L299 Pruritus, unspecified: Secondary | ICD-10-CM | POA: Diagnosis not present

## 2020-04-23 DIAGNOSIS — L299 Pruritus, unspecified: Secondary | ICD-10-CM | POA: Diagnosis not present

## 2020-04-24 DIAGNOSIS — Z20828 Contact with and (suspected) exposure to other viral communicable diseases: Secondary | ICD-10-CM | POA: Diagnosis not present

## 2020-04-27 DIAGNOSIS — J309 Allergic rhinitis, unspecified: Secondary | ICD-10-CM | POA: Diagnosis not present

## 2020-05-01 DIAGNOSIS — Z20828 Contact with and (suspected) exposure to other viral communicable diseases: Secondary | ICD-10-CM | POA: Diagnosis not present

## 2020-05-02 DIAGNOSIS — L03116 Cellulitis of left lower limb: Secondary | ICD-10-CM | POA: Diagnosis not present

## 2020-05-02 DIAGNOSIS — E118 Type 2 diabetes mellitus with unspecified complications: Secondary | ICD-10-CM | POA: Diagnosis not present

## 2020-05-02 DIAGNOSIS — I10 Essential (primary) hypertension: Secondary | ICD-10-CM | POA: Diagnosis not present

## 2020-05-02 DIAGNOSIS — G2581 Restless legs syndrome: Secondary | ICD-10-CM | POA: Diagnosis not present

## 2020-05-04 DIAGNOSIS — D519 Vitamin B12 deficiency anemia, unspecified: Secondary | ICD-10-CM | POA: Diagnosis not present

## 2020-05-04 DIAGNOSIS — R7989 Other specified abnormal findings of blood chemistry: Secondary | ICD-10-CM | POA: Diagnosis not present

## 2020-05-04 DIAGNOSIS — D509 Iron deficiency anemia, unspecified: Secondary | ICD-10-CM | POA: Diagnosis not present

## 2020-05-08 DIAGNOSIS — I10 Essential (primary) hypertension: Secondary | ICD-10-CM | POA: Diagnosis not present

## 2020-05-08 DIAGNOSIS — G2581 Restless legs syndrome: Secondary | ICD-10-CM | POA: Diagnosis not present

## 2020-05-08 DIAGNOSIS — Z20828 Contact with and (suspected) exposure to other viral communicable diseases: Secondary | ICD-10-CM | POA: Diagnosis not present

## 2020-05-08 DIAGNOSIS — E782 Mixed hyperlipidemia: Secondary | ICD-10-CM | POA: Diagnosis not present

## 2020-05-09 DIAGNOSIS — E08311 Diabetes mellitus due to underlying condition with unspecified diabetic retinopathy with macular edema: Secondary | ICD-10-CM | POA: Diagnosis not present

## 2020-05-09 DIAGNOSIS — R7989 Other specified abnormal findings of blood chemistry: Secondary | ICD-10-CM | POA: Diagnosis not present

## 2020-05-10 DIAGNOSIS — F0280 Dementia in other diseases classified elsewhere without behavioral disturbance: Secondary | ICD-10-CM | POA: Diagnosis not present

## 2020-05-10 DIAGNOSIS — I10 Essential (primary) hypertension: Secondary | ICD-10-CM | POA: Diagnosis not present

## 2020-05-10 DIAGNOSIS — N183 Chronic kidney disease, stage 3 unspecified: Secondary | ICD-10-CM | POA: Diagnosis not present

## 2020-05-10 DIAGNOSIS — Z515 Encounter for palliative care: Secondary | ICD-10-CM | POA: Diagnosis not present

## 2020-05-10 DIAGNOSIS — E119 Type 2 diabetes mellitus without complications: Secondary | ICD-10-CM | POA: Diagnosis not present

## 2020-05-14 DIAGNOSIS — Z5181 Encounter for therapeutic drug level monitoring: Secondary | ICD-10-CM | POA: Diagnosis not present

## 2020-05-15 DIAGNOSIS — Z20828 Contact with and (suspected) exposure to other viral communicable diseases: Secondary | ICD-10-CM | POA: Diagnosis not present

## 2020-05-23 DIAGNOSIS — M6281 Muscle weakness (generalized): Secondary | ICD-10-CM | POA: Diagnosis not present

## 2020-05-24 DIAGNOSIS — M6281 Muscle weakness (generalized): Secondary | ICD-10-CM | POA: Diagnosis not present

## 2020-05-25 DIAGNOSIS — M6281 Muscle weakness (generalized): Secondary | ICD-10-CM | POA: Diagnosis not present

## 2020-05-28 DIAGNOSIS — M6281 Muscle weakness (generalized): Secondary | ICD-10-CM | POA: Diagnosis not present

## 2020-05-29 DIAGNOSIS — M6281 Muscle weakness (generalized): Secondary | ICD-10-CM | POA: Diagnosis not present

## 2020-05-30 DIAGNOSIS — M6281 Muscle weakness (generalized): Secondary | ICD-10-CM | POA: Diagnosis not present

## 2020-05-31 DIAGNOSIS — M6281 Muscle weakness (generalized): Secondary | ICD-10-CM | POA: Diagnosis not present

## 2020-06-01 DIAGNOSIS — M6281 Muscle weakness (generalized): Secondary | ICD-10-CM | POA: Diagnosis not present

## 2020-06-01 DIAGNOSIS — Z23 Encounter for immunization: Secondary | ICD-10-CM | POA: Diagnosis not present

## 2020-06-01 DIAGNOSIS — Z20828 Contact with and (suspected) exposure to other viral communicable diseases: Secondary | ICD-10-CM | POA: Diagnosis not present

## 2020-06-04 DIAGNOSIS — M6281 Muscle weakness (generalized): Secondary | ICD-10-CM | POA: Diagnosis not present

## 2020-06-04 DIAGNOSIS — Z23 Encounter for immunization: Secondary | ICD-10-CM | POA: Diagnosis not present

## 2020-06-05 DIAGNOSIS — Z23 Encounter for immunization: Secondary | ICD-10-CM | POA: Diagnosis not present

## 2020-06-05 DIAGNOSIS — M6281 Muscle weakness (generalized): Secondary | ICD-10-CM | POA: Diagnosis not present

## 2020-06-06 DIAGNOSIS — M6281 Muscle weakness (generalized): Secondary | ICD-10-CM | POA: Diagnosis not present

## 2020-06-06 DIAGNOSIS — Z23 Encounter for immunization: Secondary | ICD-10-CM | POA: Diagnosis not present

## 2020-06-08 DIAGNOSIS — Z20828 Contact with and (suspected) exposure to other viral communicable diseases: Secondary | ICD-10-CM | POA: Diagnosis not present

## 2020-06-13 DIAGNOSIS — M6281 Muscle weakness (generalized): Secondary | ICD-10-CM | POA: Diagnosis not present

## 2020-06-13 DIAGNOSIS — Z79899 Other long term (current) drug therapy: Secondary | ICD-10-CM | POA: Diagnosis not present

## 2020-06-13 DIAGNOSIS — Z23 Encounter for immunization: Secondary | ICD-10-CM | POA: Diagnosis not present

## 2020-06-14 DIAGNOSIS — M6281 Muscle weakness (generalized): Secondary | ICD-10-CM | POA: Diagnosis not present

## 2020-06-14 DIAGNOSIS — Z23 Encounter for immunization: Secondary | ICD-10-CM | POA: Diagnosis not present

## 2020-06-15 DIAGNOSIS — M6281 Muscle weakness (generalized): Secondary | ICD-10-CM | POA: Diagnosis not present

## 2020-06-15 DIAGNOSIS — Z23 Encounter for immunization: Secondary | ICD-10-CM | POA: Diagnosis not present

## 2020-06-15 DIAGNOSIS — Z20828 Contact with and (suspected) exposure to other viral communicable diseases: Secondary | ICD-10-CM | POA: Diagnosis not present

## 2020-06-18 DIAGNOSIS — M6281 Muscle weakness (generalized): Secondary | ICD-10-CM | POA: Diagnosis not present

## 2020-06-18 DIAGNOSIS — Z23 Encounter for immunization: Secondary | ICD-10-CM | POA: Diagnosis not present

## 2020-06-19 DIAGNOSIS — Z23 Encounter for immunization: Secondary | ICD-10-CM | POA: Diagnosis not present

## 2020-06-19 DIAGNOSIS — F0150 Vascular dementia without behavioral disturbance: Secondary | ICD-10-CM | POA: Diagnosis not present

## 2020-06-19 DIAGNOSIS — I10 Essential (primary) hypertension: Secondary | ICD-10-CM | POA: Diagnosis not present

## 2020-06-19 DIAGNOSIS — N1832 Chronic kidney disease, stage 3b: Secondary | ICD-10-CM | POA: Diagnosis not present

## 2020-06-19 DIAGNOSIS — M6281 Muscle weakness (generalized): Secondary | ICD-10-CM | POA: Diagnosis not present

## 2020-06-22 DIAGNOSIS — Z20828 Contact with and (suspected) exposure to other viral communicable diseases: Secondary | ICD-10-CM | POA: Diagnosis not present

## 2020-06-27 DIAGNOSIS — K5909 Other constipation: Secondary | ICD-10-CM | POA: Diagnosis not present

## 2020-06-27 DIAGNOSIS — L299 Pruritus, unspecified: Secondary | ICD-10-CM | POA: Diagnosis not present

## 2020-06-27 DIAGNOSIS — D631 Anemia in chronic kidney disease: Secondary | ICD-10-CM | POA: Diagnosis not present

## 2020-06-27 DIAGNOSIS — F0390 Unspecified dementia without behavioral disturbance: Secondary | ICD-10-CM | POA: Diagnosis not present

## 2020-07-06 DIAGNOSIS — Z20828 Contact with and (suspected) exposure to other viral communicable diseases: Secondary | ICD-10-CM | POA: Diagnosis not present

## 2020-07-09 DIAGNOSIS — J302 Other seasonal allergic rhinitis: Secondary | ICD-10-CM | POA: Diagnosis not present

## 2020-07-13 DIAGNOSIS — E1151 Type 2 diabetes mellitus with diabetic peripheral angiopathy without gangrene: Secondary | ICD-10-CM | POA: Diagnosis not present

## 2020-07-13 DIAGNOSIS — B351 Tinea unguium: Secondary | ICD-10-CM | POA: Diagnosis not present

## 2020-07-18 DIAGNOSIS — I10 Essential (primary) hypertension: Secondary | ICD-10-CM | POA: Diagnosis not present

## 2020-07-18 DIAGNOSIS — E118 Type 2 diabetes mellitus with unspecified complications: Secondary | ICD-10-CM | POA: Diagnosis not present

## 2020-07-18 DIAGNOSIS — G894 Chronic pain syndrome: Secondary | ICD-10-CM | POA: Diagnosis not present

## 2020-08-06 DIAGNOSIS — F0391 Unspecified dementia with behavioral disturbance: Secondary | ICD-10-CM | POA: Diagnosis not present

## 2020-08-07 DIAGNOSIS — M6281 Muscle weakness (generalized): Secondary | ICD-10-CM | POA: Diagnosis not present

## 2020-08-08 DIAGNOSIS — M6281 Muscle weakness (generalized): Secondary | ICD-10-CM | POA: Diagnosis not present

## 2020-08-09 DIAGNOSIS — M6281 Muscle weakness (generalized): Secondary | ICD-10-CM | POA: Diagnosis not present

## 2020-08-10 DIAGNOSIS — M6281 Muscle weakness (generalized): Secondary | ICD-10-CM | POA: Diagnosis not present

## 2020-08-13 DIAGNOSIS — M6281 Muscle weakness (generalized): Secondary | ICD-10-CM | POA: Diagnosis not present

## 2020-08-15 DIAGNOSIS — M6281 Muscle weakness (generalized): Secondary | ICD-10-CM | POA: Diagnosis not present

## 2020-08-16 DIAGNOSIS — I10 Essential (primary) hypertension: Secondary | ICD-10-CM | POA: Diagnosis not present

## 2020-08-16 DIAGNOSIS — M6281 Muscle weakness (generalized): Secondary | ICD-10-CM | POA: Diagnosis not present

## 2020-08-16 DIAGNOSIS — J302 Other seasonal allergic rhinitis: Secondary | ICD-10-CM | POA: Diagnosis not present

## 2020-08-16 DIAGNOSIS — E118 Type 2 diabetes mellitus with unspecified complications: Secondary | ICD-10-CM | POA: Diagnosis not present

## 2020-08-17 DIAGNOSIS — M6281 Muscle weakness (generalized): Secondary | ICD-10-CM | POA: Diagnosis not present

## 2020-08-18 DIAGNOSIS — M6281 Muscle weakness (generalized): Secondary | ICD-10-CM | POA: Diagnosis not present

## 2020-08-20 DIAGNOSIS — M6281 Muscle weakness (generalized): Secondary | ICD-10-CM | POA: Diagnosis not present

## 2020-08-21 DIAGNOSIS — M6281 Muscle weakness (generalized): Secondary | ICD-10-CM | POA: Diagnosis not present

## 2020-08-22 DIAGNOSIS — J300 Vasomotor rhinitis: Secondary | ICD-10-CM | POA: Diagnosis not present

## 2020-08-22 DIAGNOSIS — E118 Type 2 diabetes mellitus with unspecified complications: Secondary | ICD-10-CM | POA: Diagnosis not present

## 2020-08-22 DIAGNOSIS — M6281 Muscle weakness (generalized): Secondary | ICD-10-CM | POA: Diagnosis not present

## 2020-08-23 DIAGNOSIS — M6281 Muscle weakness (generalized): Secondary | ICD-10-CM | POA: Diagnosis not present

## 2020-08-29 DIAGNOSIS — D649 Anemia, unspecified: Secondary | ICD-10-CM | POA: Diagnosis not present

## 2020-08-29 DIAGNOSIS — F0390 Unspecified dementia without behavioral disturbance: Secondary | ICD-10-CM | POA: Diagnosis not present

## 2020-08-29 DIAGNOSIS — E785 Hyperlipidemia, unspecified: Secondary | ICD-10-CM | POA: Diagnosis not present

## 2020-08-29 DIAGNOSIS — R270 Ataxia, unspecified: Secondary | ICD-10-CM | POA: Diagnosis not present

## 2020-08-29 DIAGNOSIS — E118 Type 2 diabetes mellitus with unspecified complications: Secondary | ICD-10-CM | POA: Diagnosis not present

## 2020-08-31 DIAGNOSIS — Z20828 Contact with and (suspected) exposure to other viral communicable diseases: Secondary | ICD-10-CM | POA: Diagnosis not present

## 2020-09-03 DIAGNOSIS — G894 Chronic pain syndrome: Secondary | ICD-10-CM | POA: Diagnosis not present

## 2020-09-03 DIAGNOSIS — J302 Other seasonal allergic rhinitis: Secondary | ICD-10-CM | POA: Diagnosis not present

## 2020-09-04 DIAGNOSIS — Z20828 Contact with and (suspected) exposure to other viral communicable diseases: Secondary | ICD-10-CM | POA: Diagnosis not present

## 2020-09-05 DIAGNOSIS — M6281 Muscle weakness (generalized): Secondary | ICD-10-CM | POA: Diagnosis not present

## 2020-09-06 DIAGNOSIS — M6281 Muscle weakness (generalized): Secondary | ICD-10-CM | POA: Diagnosis not present

## 2020-09-07 DIAGNOSIS — M6281 Muscle weakness (generalized): Secondary | ICD-10-CM | POA: Diagnosis not present

## 2020-09-09 DIAGNOSIS — M6281 Muscle weakness (generalized): Secondary | ICD-10-CM | POA: Diagnosis not present

## 2020-09-11 DIAGNOSIS — Z20828 Contact with and (suspected) exposure to other viral communicable diseases: Secondary | ICD-10-CM | POA: Diagnosis not present

## 2020-09-11 DIAGNOSIS — M6281 Muscle weakness (generalized): Secondary | ICD-10-CM | POA: Diagnosis not present

## 2020-09-12 DIAGNOSIS — M6281 Muscle weakness (generalized): Secondary | ICD-10-CM | POA: Diagnosis not present

## 2020-09-12 DIAGNOSIS — I739 Peripheral vascular disease, unspecified: Secondary | ICD-10-CM | POA: Diagnosis not present

## 2020-09-13 DIAGNOSIS — M6281 Muscle weakness (generalized): Secondary | ICD-10-CM | POA: Diagnosis not present

## 2020-09-14 DIAGNOSIS — L84 Corns and callosities: Secondary | ICD-10-CM | POA: Diagnosis not present

## 2020-09-14 DIAGNOSIS — B351 Tinea unguium: Secondary | ICD-10-CM | POA: Diagnosis not present

## 2020-09-14 DIAGNOSIS — E1140 Type 2 diabetes mellitus with diabetic neuropathy, unspecified: Secondary | ICD-10-CM | POA: Diagnosis not present

## 2020-09-15 DIAGNOSIS — M6281 Muscle weakness (generalized): Secondary | ICD-10-CM | POA: Diagnosis not present

## 2020-09-17 DIAGNOSIS — M6281 Muscle weakness (generalized): Secondary | ICD-10-CM | POA: Diagnosis not present

## 2020-09-18 DIAGNOSIS — Z20828 Contact with and (suspected) exposure to other viral communicable diseases: Secondary | ICD-10-CM | POA: Diagnosis not present

## 2020-09-18 DIAGNOSIS — M6281 Muscle weakness (generalized): Secondary | ICD-10-CM | POA: Diagnosis not present

## 2020-09-19 DIAGNOSIS — F0150 Vascular dementia without behavioral disturbance: Secondary | ICD-10-CM | POA: Diagnosis not present

## 2020-09-19 DIAGNOSIS — K219 Gastro-esophageal reflux disease without esophagitis: Secondary | ICD-10-CM | POA: Diagnosis not present

## 2020-09-19 DIAGNOSIS — I10 Essential (primary) hypertension: Secondary | ICD-10-CM | POA: Diagnosis not present

## 2020-09-19 DIAGNOSIS — R296 Repeated falls: Secondary | ICD-10-CM | POA: Diagnosis not present

## 2020-09-19 DIAGNOSIS — M6281 Muscle weakness (generalized): Secondary | ICD-10-CM | POA: Diagnosis not present

## 2020-09-20 DIAGNOSIS — M6281 Muscle weakness (generalized): Secondary | ICD-10-CM | POA: Diagnosis not present

## 2020-09-21 DIAGNOSIS — M6281 Muscle weakness (generalized): Secondary | ICD-10-CM | POA: Diagnosis not present

## 2020-09-21 DIAGNOSIS — Z89511 Acquired absence of right leg below knee: Secondary | ICD-10-CM | POA: Diagnosis not present

## 2020-09-25 DIAGNOSIS — Z20828 Contact with and (suspected) exposure to other viral communicable diseases: Secondary | ICD-10-CM | POA: Diagnosis not present

## 2020-10-01 DIAGNOSIS — G894 Chronic pain syndrome: Secondary | ICD-10-CM | POA: Diagnosis not present

## 2020-10-02 DIAGNOSIS — I10 Essential (primary) hypertension: Secondary | ICD-10-CM | POA: Diagnosis not present

## 2020-10-02 DIAGNOSIS — Z20828 Contact with and (suspected) exposure to other viral communicable diseases: Secondary | ICD-10-CM | POA: Diagnosis not present

## 2020-10-02 DIAGNOSIS — F0281 Dementia in other diseases classified elsewhere with behavioral disturbance: Secondary | ICD-10-CM | POA: Diagnosis not present

## 2020-10-02 DIAGNOSIS — E119 Type 2 diabetes mellitus without complications: Secondary | ICD-10-CM | POA: Diagnosis not present

## 2020-10-02 DIAGNOSIS — F0280 Dementia in other diseases classified elsewhere without behavioral disturbance: Secondary | ICD-10-CM | POA: Diagnosis not present

## 2020-10-02 DIAGNOSIS — N183 Chronic kidney disease, stage 3 unspecified: Secondary | ICD-10-CM | POA: Diagnosis not present

## 2020-10-02 DIAGNOSIS — Z515 Encounter for palliative care: Secondary | ICD-10-CM | POA: Diagnosis not present

## 2020-10-09 DIAGNOSIS — Z20828 Contact with and (suspected) exposure to other viral communicable diseases: Secondary | ICD-10-CM | POA: Diagnosis not present

## 2020-10-16 DIAGNOSIS — Z03818 Encounter for observation for suspected exposure to other biological agents ruled out: Secondary | ICD-10-CM | POA: Diagnosis not present

## 2020-10-18 DIAGNOSIS — E118 Type 2 diabetes mellitus with unspecified complications: Secondary | ICD-10-CM | POA: Diagnosis not present

## 2020-10-18 DIAGNOSIS — G4700 Insomnia, unspecified: Secondary | ICD-10-CM | POA: Diagnosis not present

## 2020-10-18 DIAGNOSIS — J309 Allergic rhinitis, unspecified: Secondary | ICD-10-CM | POA: Diagnosis not present

## 2020-10-18 DIAGNOSIS — I10 Essential (primary) hypertension: Secondary | ICD-10-CM | POA: Diagnosis not present

## 2020-10-18 DIAGNOSIS — G894 Chronic pain syndrome: Secondary | ICD-10-CM | POA: Diagnosis not present

## 2020-10-18 DIAGNOSIS — E7849 Other hyperlipidemia: Secondary | ICD-10-CM | POA: Diagnosis not present

## 2020-10-23 DIAGNOSIS — Z20828 Contact with and (suspected) exposure to other viral communicable diseases: Secondary | ICD-10-CM | POA: Diagnosis not present

## 2020-10-24 DIAGNOSIS — I10 Essential (primary) hypertension: Secondary | ICD-10-CM | POA: Diagnosis not present

## 2020-10-24 DIAGNOSIS — F0391 Unspecified dementia with behavioral disturbance: Secondary | ICD-10-CM | POA: Diagnosis not present

## 2020-10-24 DIAGNOSIS — I739 Peripheral vascular disease, unspecified: Secondary | ICD-10-CM | POA: Diagnosis not present

## 2020-10-30 DIAGNOSIS — Z20828 Contact with and (suspected) exposure to other viral communicable diseases: Secondary | ICD-10-CM | POA: Diagnosis not present

## 2020-11-08 DIAGNOSIS — H61891 Other specified disorders of right external ear: Secondary | ICD-10-CM | POA: Diagnosis not present

## 2020-11-08 DIAGNOSIS — G894 Chronic pain syndrome: Secondary | ICD-10-CM | POA: Diagnosis not present

## 2020-11-08 DIAGNOSIS — E7849 Other hyperlipidemia: Secondary | ICD-10-CM | POA: Diagnosis not present

## 2020-11-08 DIAGNOSIS — E538 Deficiency of other specified B group vitamins: Secondary | ICD-10-CM | POA: Diagnosis not present

## 2020-11-30 DIAGNOSIS — R2681 Unsteadiness on feet: Secondary | ICD-10-CM | POA: Diagnosis not present

## 2020-11-30 DIAGNOSIS — M6281 Muscle weakness (generalized): Secondary | ICD-10-CM | POA: Diagnosis not present

## 2020-12-10 DIAGNOSIS — R2681 Unsteadiness on feet: Secondary | ICD-10-CM | POA: Diagnosis not present

## 2020-12-10 DIAGNOSIS — M6281 Muscle weakness (generalized): Secondary | ICD-10-CM | POA: Diagnosis not present

## 2020-12-11 DIAGNOSIS — R2681 Unsteadiness on feet: Secondary | ICD-10-CM | POA: Diagnosis not present

## 2020-12-11 DIAGNOSIS — M6281 Muscle weakness (generalized): Secondary | ICD-10-CM | POA: Diagnosis not present

## 2020-12-12 DIAGNOSIS — M6281 Muscle weakness (generalized): Secondary | ICD-10-CM | POA: Diagnosis not present

## 2020-12-12 DIAGNOSIS — R2681 Unsteadiness on feet: Secondary | ICD-10-CM | POA: Diagnosis not present

## 2020-12-13 DIAGNOSIS — I129 Hypertensive chronic kidney disease with stage 1 through stage 4 chronic kidney disease, or unspecified chronic kidney disease: Secondary | ICD-10-CM | POA: Diagnosis not present

## 2020-12-13 DIAGNOSIS — G894 Chronic pain syndrome: Secondary | ICD-10-CM | POA: Diagnosis not present

## 2020-12-13 DIAGNOSIS — F0150 Vascular dementia without behavioral disturbance: Secondary | ICD-10-CM | POA: Diagnosis not present

## 2020-12-13 DIAGNOSIS — J300 Vasomotor rhinitis: Secondary | ICD-10-CM | POA: Diagnosis not present

## 2020-12-13 DIAGNOSIS — M6281 Muscle weakness (generalized): Secondary | ICD-10-CM | POA: Diagnosis not present

## 2020-12-13 DIAGNOSIS — R2681 Unsteadiness on feet: Secondary | ICD-10-CM | POA: Diagnosis not present

## 2020-12-13 DIAGNOSIS — N1832 Chronic kidney disease, stage 3b: Secondary | ICD-10-CM | POA: Diagnosis not present

## 2020-12-14 DIAGNOSIS — M6281 Muscle weakness (generalized): Secondary | ICD-10-CM | POA: Diagnosis not present

## 2020-12-14 DIAGNOSIS — R2681 Unsteadiness on feet: Secondary | ICD-10-CM | POA: Diagnosis not present

## 2020-12-17 DIAGNOSIS — M6281 Muscle weakness (generalized): Secondary | ICD-10-CM | POA: Diagnosis not present

## 2020-12-17 DIAGNOSIS — R2681 Unsteadiness on feet: Secondary | ICD-10-CM | POA: Diagnosis not present

## 2020-12-17 DIAGNOSIS — Z79899 Other long term (current) drug therapy: Secondary | ICD-10-CM | POA: Diagnosis not present

## 2020-12-18 DIAGNOSIS — B349 Viral infection, unspecified: Secondary | ICD-10-CM | POA: Diagnosis not present

## 2020-12-18 DIAGNOSIS — R2681 Unsteadiness on feet: Secondary | ICD-10-CM | POA: Diagnosis not present

## 2020-12-18 DIAGNOSIS — M6281 Muscle weakness (generalized): Secondary | ICD-10-CM | POA: Diagnosis not present

## 2020-12-19 DIAGNOSIS — M6281 Muscle weakness (generalized): Secondary | ICD-10-CM | POA: Diagnosis not present

## 2020-12-19 DIAGNOSIS — R2681 Unsteadiness on feet: Secondary | ICD-10-CM | POA: Diagnosis not present

## 2020-12-20 DIAGNOSIS — R2681 Unsteadiness on feet: Secondary | ICD-10-CM | POA: Diagnosis not present

## 2020-12-20 DIAGNOSIS — M6281 Muscle weakness (generalized): Secondary | ICD-10-CM | POA: Diagnosis not present

## 2020-12-21 DIAGNOSIS — R2681 Unsteadiness on feet: Secondary | ICD-10-CM | POA: Diagnosis not present

## 2020-12-21 DIAGNOSIS — M6281 Muscle weakness (generalized): Secondary | ICD-10-CM | POA: Diagnosis not present

## 2020-12-24 DIAGNOSIS — M6281 Muscle weakness (generalized): Secondary | ICD-10-CM | POA: Diagnosis not present

## 2020-12-24 DIAGNOSIS — R2681 Unsteadiness on feet: Secondary | ICD-10-CM | POA: Diagnosis not present

## 2020-12-25 DIAGNOSIS — R2681 Unsteadiness on feet: Secondary | ICD-10-CM | POA: Diagnosis not present

## 2020-12-25 DIAGNOSIS — B349 Viral infection, unspecified: Secondary | ICD-10-CM | POA: Diagnosis not present

## 2020-12-25 DIAGNOSIS — M6281 Muscle weakness (generalized): Secondary | ICD-10-CM | POA: Diagnosis not present

## 2020-12-25 DIAGNOSIS — E539 Vitamin B deficiency, unspecified: Secondary | ICD-10-CM | POA: Diagnosis not present

## 2020-12-26 DIAGNOSIS — R2681 Unsteadiness on feet: Secondary | ICD-10-CM | POA: Diagnosis not present

## 2020-12-26 DIAGNOSIS — Z794 Long term (current) use of insulin: Secondary | ICD-10-CM | POA: Diagnosis not present

## 2020-12-26 DIAGNOSIS — E119 Type 2 diabetes mellitus without complications: Secondary | ICD-10-CM | POA: Diagnosis not present

## 2020-12-26 DIAGNOSIS — R270 Ataxia, unspecified: Secondary | ICD-10-CM | POA: Diagnosis not present

## 2020-12-26 DIAGNOSIS — Z961 Presence of intraocular lens: Secondary | ICD-10-CM | POA: Diagnosis not present

## 2020-12-26 DIAGNOSIS — Z7984 Long term (current) use of oral hypoglycemic drugs: Secondary | ICD-10-CM | POA: Diagnosis not present

## 2020-12-26 DIAGNOSIS — F0391 Unspecified dementia with behavioral disturbance: Secondary | ICD-10-CM | POA: Diagnosis not present

## 2020-12-26 DIAGNOSIS — E118 Type 2 diabetes mellitus with unspecified complications: Secondary | ICD-10-CM | POA: Diagnosis not present

## 2020-12-26 DIAGNOSIS — M6281 Muscle weakness (generalized): Secondary | ICD-10-CM | POA: Diagnosis not present

## 2020-12-26 DIAGNOSIS — Z79899 Other long term (current) drug therapy: Secondary | ICD-10-CM | POA: Diagnosis not present

## 2020-12-27 DIAGNOSIS — M6281 Muscle weakness (generalized): Secondary | ICD-10-CM | POA: Diagnosis not present

## 2020-12-27 DIAGNOSIS — R2681 Unsteadiness on feet: Secondary | ICD-10-CM | POA: Diagnosis not present

## 2020-12-28 DIAGNOSIS — E1165 Type 2 diabetes mellitus with hyperglycemia: Secondary | ICD-10-CM | POA: Diagnosis not present

## 2020-12-28 DIAGNOSIS — N1832 Chronic kidney disease, stage 3b: Secondary | ICD-10-CM | POA: Diagnosis not present

## 2020-12-28 DIAGNOSIS — M6281 Muscle weakness (generalized): Secondary | ICD-10-CM | POA: Diagnosis not present

## 2020-12-28 DIAGNOSIS — R2681 Unsteadiness on feet: Secondary | ICD-10-CM | POA: Diagnosis not present

## 2020-12-31 DIAGNOSIS — M6281 Muscle weakness (generalized): Secondary | ICD-10-CM | POA: Diagnosis not present

## 2020-12-31 DIAGNOSIS — R2681 Unsteadiness on feet: Secondary | ICD-10-CM | POA: Diagnosis not present

## 2021-01-01 DIAGNOSIS — R2681 Unsteadiness on feet: Secondary | ICD-10-CM | POA: Diagnosis not present

## 2021-01-01 DIAGNOSIS — M6281 Muscle weakness (generalized): Secondary | ICD-10-CM | POA: Diagnosis not present

## 2021-01-02 DIAGNOSIS — R2681 Unsteadiness on feet: Secondary | ICD-10-CM | POA: Diagnosis not present

## 2021-01-02 DIAGNOSIS — M6281 Muscle weakness (generalized): Secondary | ICD-10-CM | POA: Diagnosis not present

## 2021-01-15 DIAGNOSIS — J300 Vasomotor rhinitis: Secondary | ICD-10-CM | POA: Diagnosis not present

## 2021-01-15 DIAGNOSIS — G894 Chronic pain syndrome: Secondary | ICD-10-CM | POA: Diagnosis not present

## 2021-01-15 DIAGNOSIS — E1165 Type 2 diabetes mellitus with hyperglycemia: Secondary | ICD-10-CM | POA: Diagnosis not present

## 2021-01-15 DIAGNOSIS — F0391 Unspecified dementia with behavioral disturbance: Secondary | ICD-10-CM | POA: Diagnosis not present

## 2021-01-15 DIAGNOSIS — K5909 Other constipation: Secondary | ICD-10-CM | POA: Diagnosis not present

## 2021-02-13 DIAGNOSIS — E119 Type 2 diabetes mellitus without complications: Secondary | ICD-10-CM | POA: Diagnosis not present

## 2021-02-13 DIAGNOSIS — N1832 Chronic kidney disease, stage 3b: Secondary | ICD-10-CM | POA: Diagnosis not present

## 2021-02-13 DIAGNOSIS — G894 Chronic pain syndrome: Secondary | ICD-10-CM | POA: Diagnosis not present

## 2021-02-13 DIAGNOSIS — J309 Allergic rhinitis, unspecified: Secondary | ICD-10-CM | POA: Diagnosis not present

## 2021-02-13 DIAGNOSIS — I129 Hypertensive chronic kidney disease with stage 1 through stage 4 chronic kidney disease, or unspecified chronic kidney disease: Secondary | ICD-10-CM | POA: Diagnosis not present

## 2021-02-27 DIAGNOSIS — F32A Depression, unspecified: Secondary | ICD-10-CM | POA: Diagnosis not present

## 2021-02-27 DIAGNOSIS — I10 Essential (primary) hypertension: Secondary | ICD-10-CM | POA: Diagnosis not present

## 2021-02-27 DIAGNOSIS — G3183 Dementia with Lewy bodies: Secondary | ICD-10-CM | POA: Diagnosis not present

## 2021-02-27 DIAGNOSIS — G4700 Insomnia, unspecified: Secondary | ICD-10-CM | POA: Diagnosis not present

## 2021-03-05 DIAGNOSIS — M6281 Muscle weakness (generalized): Secondary | ICD-10-CM | POA: Diagnosis not present

## 2021-03-05 DIAGNOSIS — H04123 Dry eye syndrome of bilateral lacrimal glands: Secondary | ICD-10-CM | POA: Diagnosis not present

## 2021-03-06 DIAGNOSIS — M6281 Muscle weakness (generalized): Secondary | ICD-10-CM | POA: Diagnosis not present

## 2021-03-07 DIAGNOSIS — M6281 Muscle weakness (generalized): Secondary | ICD-10-CM | POA: Diagnosis not present

## 2021-03-08 DIAGNOSIS — M6281 Muscle weakness (generalized): Secondary | ICD-10-CM | POA: Diagnosis not present

## 2021-03-11 DIAGNOSIS — M6281 Muscle weakness (generalized): Secondary | ICD-10-CM | POA: Diagnosis not present

## 2021-03-12 DIAGNOSIS — M6281 Muscle weakness (generalized): Secondary | ICD-10-CM | POA: Diagnosis not present

## 2021-03-13 DIAGNOSIS — M6281 Muscle weakness (generalized): Secondary | ICD-10-CM | POA: Diagnosis not present

## 2021-03-14 DIAGNOSIS — M6281 Muscle weakness (generalized): Secondary | ICD-10-CM | POA: Diagnosis not present

## 2021-03-14 DIAGNOSIS — I10 Essential (primary) hypertension: Secondary | ICD-10-CM | POA: Diagnosis not present

## 2021-03-14 DIAGNOSIS — G4700 Insomnia, unspecified: Secondary | ICD-10-CM | POA: Diagnosis not present

## 2021-03-14 DIAGNOSIS — E119 Type 2 diabetes mellitus without complications: Secondary | ICD-10-CM | POA: Diagnosis not present

## 2021-03-15 DIAGNOSIS — M6281 Muscle weakness (generalized): Secondary | ICD-10-CM | POA: Diagnosis not present

## 2021-03-18 DIAGNOSIS — M6281 Muscle weakness (generalized): Secondary | ICD-10-CM | POA: Diagnosis not present

## 2021-03-20 DIAGNOSIS — E785 Hyperlipidemia, unspecified: Secondary | ICD-10-CM | POA: Diagnosis not present

## 2021-03-27 DIAGNOSIS — Z79899 Other long term (current) drug therapy: Secondary | ICD-10-CM | POA: Diagnosis not present

## 2021-03-27 DIAGNOSIS — G894 Chronic pain syndrome: Secondary | ICD-10-CM | POA: Diagnosis not present

## 2021-03-28 DIAGNOSIS — E1151 Type 2 diabetes mellitus with diabetic peripheral angiopathy without gangrene: Secondary | ICD-10-CM | POA: Diagnosis not present

## 2021-03-28 DIAGNOSIS — R0981 Nasal congestion: Secondary | ICD-10-CM | POA: Diagnosis not present

## 2021-03-28 DIAGNOSIS — B351 Tinea unguium: Secondary | ICD-10-CM | POA: Diagnosis not present

## 2021-03-28 DIAGNOSIS — L84 Corns and callosities: Secondary | ICD-10-CM | POA: Diagnosis not present

## 2021-04-04 DIAGNOSIS — E785 Hyperlipidemia, unspecified: Secondary | ICD-10-CM | POA: Diagnosis not present

## 2021-04-04 DIAGNOSIS — F419 Anxiety disorder, unspecified: Secondary | ICD-10-CM | POA: Diagnosis not present

## 2021-04-04 DIAGNOSIS — I129 Hypertensive chronic kidney disease with stage 1 through stage 4 chronic kidney disease, or unspecified chronic kidney disease: Secondary | ICD-10-CM | POA: Diagnosis not present

## 2021-04-04 DIAGNOSIS — N1832 Chronic kidney disease, stage 3b: Secondary | ICD-10-CM | POA: Diagnosis not present

## 2021-04-04 DIAGNOSIS — R270 Ataxia, unspecified: Secondary | ICD-10-CM | POA: Diagnosis not present

## 2021-04-04 DIAGNOSIS — G894 Chronic pain syndrome: Secondary | ICD-10-CM | POA: Diagnosis not present

## 2021-04-04 DIAGNOSIS — G2581 Restless legs syndrome: Secondary | ICD-10-CM | POA: Diagnosis not present

## 2021-04-17 DIAGNOSIS — F419 Anxiety disorder, unspecified: Secondary | ICD-10-CM | POA: Diagnosis not present

## 2021-04-17 DIAGNOSIS — F29 Unspecified psychosis not due to a substance or known physiological condition: Secondary | ICD-10-CM | POA: Diagnosis not present

## 2021-04-29 DIAGNOSIS — F419 Anxiety disorder, unspecified: Secondary | ICD-10-CM | POA: Diagnosis not present

## 2021-05-01 DIAGNOSIS — F0391 Unspecified dementia with behavioral disturbance: Secondary | ICD-10-CM | POA: Diagnosis not present

## 2021-05-01 DIAGNOSIS — E118 Type 2 diabetes mellitus with unspecified complications: Secondary | ICD-10-CM | POA: Diagnosis not present

## 2021-05-01 DIAGNOSIS — F29 Unspecified psychosis not due to a substance or known physiological condition: Secondary | ICD-10-CM | POA: Diagnosis not present

## 2021-05-01 DIAGNOSIS — I10 Essential (primary) hypertension: Secondary | ICD-10-CM | POA: Diagnosis not present

## 2021-05-01 DIAGNOSIS — K219 Gastro-esophageal reflux disease without esophagitis: Secondary | ICD-10-CM | POA: Diagnosis not present

## 2021-05-07 DIAGNOSIS — Z7184 Encounter for health counseling related to travel: Secondary | ICD-10-CM | POA: Diagnosis not present

## 2021-05-07 DIAGNOSIS — E119 Type 2 diabetes mellitus without complications: Secondary | ICD-10-CM | POA: Diagnosis not present

## 2021-05-07 DIAGNOSIS — Z01812 Encounter for preprocedural laboratory examination: Secondary | ICD-10-CM | POA: Diagnosis not present

## 2021-05-07 DIAGNOSIS — E08311 Diabetes mellitus due to underlying condition with unspecified diabetic retinopathy with macular edema: Secondary | ICD-10-CM | POA: Diagnosis not present

## 2021-05-09 DIAGNOSIS — I10 Essential (primary) hypertension: Secondary | ICD-10-CM | POA: Diagnosis not present

## 2021-05-09 DIAGNOSIS — F0391 Unspecified dementia with behavioral disturbance: Secondary | ICD-10-CM | POA: Diagnosis not present

## 2021-05-09 DIAGNOSIS — K219 Gastro-esophageal reflux disease without esophagitis: Secondary | ICD-10-CM | POA: Diagnosis not present

## 2021-05-09 DIAGNOSIS — F29 Unspecified psychosis not due to a substance or known physiological condition: Secondary | ICD-10-CM | POA: Diagnosis not present

## 2021-05-09 DIAGNOSIS — E118 Type 2 diabetes mellitus with unspecified complications: Secondary | ICD-10-CM | POA: Diagnosis not present

## 2021-05-09 DIAGNOSIS — G4700 Insomnia, unspecified: Secondary | ICD-10-CM | POA: Diagnosis not present

## 2021-05-10 DIAGNOSIS — R296 Repeated falls: Secondary | ICD-10-CM | POA: Diagnosis not present

## 2021-06-03 DIAGNOSIS — F03918 Unspecified dementia, unspecified severity, with other behavioral disturbance: Secondary | ICD-10-CM | POA: Diagnosis not present

## 2021-06-05 DIAGNOSIS — N1832 Chronic kidney disease, stage 3b: Secondary | ICD-10-CM | POA: Diagnosis not present

## 2021-06-05 DIAGNOSIS — G3183 Dementia with Lewy bodies: Secondary | ICD-10-CM | POA: Diagnosis not present

## 2021-06-05 DIAGNOSIS — E119 Type 2 diabetes mellitus without complications: Secondary | ICD-10-CM | POA: Diagnosis not present

## 2021-06-05 DIAGNOSIS — I129 Hypertensive chronic kidney disease with stage 1 through stage 4 chronic kidney disease, or unspecified chronic kidney disease: Secondary | ICD-10-CM | POA: Diagnosis not present

## 2021-06-05 DIAGNOSIS — G894 Chronic pain syndrome: Secondary | ICD-10-CM | POA: Diagnosis not present

## 2021-06-12 DIAGNOSIS — Z23 Encounter for immunization: Secondary | ICD-10-CM | POA: Diagnosis not present

## 2021-06-13 DIAGNOSIS — E1151 Type 2 diabetes mellitus with diabetic peripheral angiopathy without gangrene: Secondary | ICD-10-CM | POA: Diagnosis not present

## 2021-06-13 DIAGNOSIS — B351 Tinea unguium: Secondary | ICD-10-CM | POA: Diagnosis not present

## 2021-06-20 DIAGNOSIS — N1832 Chronic kidney disease, stage 3b: Secondary | ICD-10-CM | POA: Diagnosis not present

## 2021-06-21 DIAGNOSIS — R7989 Other specified abnormal findings of blood chemistry: Secondary | ICD-10-CM | POA: Diagnosis not present

## 2021-06-24 DIAGNOSIS — K219 Gastro-esophageal reflux disease without esophagitis: Secondary | ICD-10-CM | POA: Diagnosis not present

## 2021-06-26 DIAGNOSIS — F0280 Dementia in other diseases classified elsewhere without behavioral disturbance: Secondary | ICD-10-CM | POA: Diagnosis not present

## 2021-06-26 DIAGNOSIS — D631 Anemia in chronic kidney disease: Secondary | ICD-10-CM | POA: Diagnosis not present

## 2021-06-26 DIAGNOSIS — E118 Type 2 diabetes mellitus with unspecified complications: Secondary | ICD-10-CM | POA: Diagnosis not present

## 2021-06-26 DIAGNOSIS — E7849 Other hyperlipidemia: Secondary | ICD-10-CM | POA: Diagnosis not present

## 2021-06-26 DIAGNOSIS — G4700 Insomnia, unspecified: Secondary | ICD-10-CM | POA: Diagnosis not present

## 2021-07-01 DIAGNOSIS — F0280 Dementia in other diseases classified elsewhere without behavioral disturbance: Secondary | ICD-10-CM | POA: Diagnosis not present

## 2021-07-01 DIAGNOSIS — G4700 Insomnia, unspecified: Secondary | ICD-10-CM | POA: Diagnosis not present

## 2021-07-01 DIAGNOSIS — I129 Hypertensive chronic kidney disease with stage 1 through stage 4 chronic kidney disease, or unspecified chronic kidney disease: Secondary | ICD-10-CM | POA: Diagnosis not present

## 2021-07-01 DIAGNOSIS — D631 Anemia in chronic kidney disease: Secondary | ICD-10-CM | POA: Diagnosis not present

## 2021-07-01 DIAGNOSIS — G894 Chronic pain syndrome: Secondary | ICD-10-CM | POA: Diagnosis not present

## 2021-07-01 DIAGNOSIS — K219 Gastro-esophageal reflux disease without esophagitis: Secondary | ICD-10-CM | POA: Diagnosis not present

## 2021-07-15 DIAGNOSIS — R0981 Nasal congestion: Secondary | ICD-10-CM | POA: Diagnosis not present

## 2021-07-15 DIAGNOSIS — J309 Allergic rhinitis, unspecified: Secondary | ICD-10-CM | POA: Diagnosis not present

## 2021-07-19 DIAGNOSIS — R051 Acute cough: Secondary | ICD-10-CM | POA: Diagnosis not present

## 2021-07-19 DIAGNOSIS — R0689 Other abnormalities of breathing: Secondary | ICD-10-CM | POA: Diagnosis not present

## 2021-07-21 DIAGNOSIS — J209 Acute bronchitis, unspecified: Secondary | ICD-10-CM | POA: Diagnosis not present

## 2021-07-29 DIAGNOSIS — J209 Acute bronchitis, unspecified: Secondary | ICD-10-CM | POA: Diagnosis not present

## 2021-07-29 DIAGNOSIS — G894 Chronic pain syndrome: Secondary | ICD-10-CM | POA: Diagnosis not present

## 2021-08-07 DIAGNOSIS — N1832 Chronic kidney disease, stage 3b: Secondary | ICD-10-CM | POA: Diagnosis not present

## 2021-08-07 DIAGNOSIS — I10 Essential (primary) hypertension: Secondary | ICD-10-CM | POA: Diagnosis not present

## 2021-08-07 DIAGNOSIS — G3183 Dementia with Lewy bodies: Secondary | ICD-10-CM | POA: Diagnosis not present

## 2021-08-07 DIAGNOSIS — G894 Chronic pain syndrome: Secondary | ICD-10-CM | POA: Diagnosis not present

## 2021-08-07 DIAGNOSIS — E1122 Type 2 diabetes mellitus with diabetic chronic kidney disease: Secondary | ICD-10-CM | POA: Diagnosis not present

## 2021-08-13 DIAGNOSIS — G3183 Dementia with Lewy bodies: Secondary | ICD-10-CM | POA: Diagnosis not present

## 2021-08-13 DIAGNOSIS — E559 Vitamin D deficiency, unspecified: Secondary | ICD-10-CM | POA: Diagnosis not present

## 2021-08-13 DIAGNOSIS — E1122 Type 2 diabetes mellitus with diabetic chronic kidney disease: Secondary | ICD-10-CM | POA: Diagnosis not present

## 2021-08-14 DIAGNOSIS — E559 Vitamin D deficiency, unspecified: Secondary | ICD-10-CM | POA: Diagnosis not present

## 2021-08-14 DIAGNOSIS — Z79899 Other long term (current) drug therapy: Secondary | ICD-10-CM | POA: Diagnosis not present

## 2021-08-19 DIAGNOSIS — I10 Essential (primary) hypertension: Secondary | ICD-10-CM | POA: Diagnosis not present

## 2021-08-19 DIAGNOSIS — E875 Hyperkalemia: Secondary | ICD-10-CM | POA: Diagnosis not present

## 2021-08-20 DIAGNOSIS — R262 Difficulty in walking, not elsewhere classified: Secondary | ICD-10-CM | POA: Diagnosis not present

## 2021-08-20 DIAGNOSIS — M6259 Muscle wasting and atrophy, not elsewhere classified, multiple sites: Secondary | ICD-10-CM | POA: Diagnosis not present

## 2021-08-20 DIAGNOSIS — E875 Hyperkalemia: Secondary | ICD-10-CM | POA: Diagnosis not present

## 2021-08-20 DIAGNOSIS — M1712 Unilateral primary osteoarthritis, left knee: Secondary | ICD-10-CM | POA: Diagnosis not present

## 2021-08-20 DIAGNOSIS — R278 Other lack of coordination: Secondary | ICD-10-CM | POA: Diagnosis not present

## 2021-08-20 DIAGNOSIS — I10 Essential (primary) hypertension: Secondary | ICD-10-CM | POA: Diagnosis not present

## 2021-08-21 DIAGNOSIS — R278 Other lack of coordination: Secondary | ICD-10-CM | POA: Diagnosis not present

## 2021-08-21 DIAGNOSIS — R262 Difficulty in walking, not elsewhere classified: Secondary | ICD-10-CM | POA: Diagnosis not present

## 2021-08-21 DIAGNOSIS — M6259 Muscle wasting and atrophy, not elsewhere classified, multiple sites: Secondary | ICD-10-CM | POA: Diagnosis not present

## 2021-08-22 DIAGNOSIS — M6259 Muscle wasting and atrophy, not elsewhere classified, multiple sites: Secondary | ICD-10-CM | POA: Diagnosis not present

## 2021-08-22 DIAGNOSIS — R262 Difficulty in walking, not elsewhere classified: Secondary | ICD-10-CM | POA: Diagnosis not present

## 2021-08-22 DIAGNOSIS — R278 Other lack of coordination: Secondary | ICD-10-CM | POA: Diagnosis not present

## 2021-08-23 DIAGNOSIS — M6259 Muscle wasting and atrophy, not elsewhere classified, multiple sites: Secondary | ICD-10-CM | POA: Diagnosis not present

## 2021-08-23 DIAGNOSIS — R278 Other lack of coordination: Secondary | ICD-10-CM | POA: Diagnosis not present

## 2021-08-23 DIAGNOSIS — R262 Difficulty in walking, not elsewhere classified: Secondary | ICD-10-CM | POA: Diagnosis not present

## 2021-08-26 DIAGNOSIS — R278 Other lack of coordination: Secondary | ICD-10-CM | POA: Diagnosis not present

## 2021-08-26 DIAGNOSIS — R262 Difficulty in walking, not elsewhere classified: Secondary | ICD-10-CM | POA: Diagnosis not present

## 2021-08-26 DIAGNOSIS — M6259 Muscle wasting and atrophy, not elsewhere classified, multiple sites: Secondary | ICD-10-CM | POA: Diagnosis not present

## 2021-08-27 DIAGNOSIS — R262 Difficulty in walking, not elsewhere classified: Secondary | ICD-10-CM | POA: Diagnosis not present

## 2021-08-27 DIAGNOSIS — Z79899 Other long term (current) drug therapy: Secondary | ICD-10-CM | POA: Diagnosis not present

## 2021-08-27 DIAGNOSIS — R278 Other lack of coordination: Secondary | ICD-10-CM | POA: Diagnosis not present

## 2021-08-27 DIAGNOSIS — M6259 Muscle wasting and atrophy, not elsewhere classified, multiple sites: Secondary | ICD-10-CM | POA: Diagnosis not present

## 2021-08-28 DIAGNOSIS — R262 Difficulty in walking, not elsewhere classified: Secondary | ICD-10-CM | POA: Diagnosis not present

## 2021-08-28 DIAGNOSIS — R278 Other lack of coordination: Secondary | ICD-10-CM | POA: Diagnosis not present

## 2021-08-28 DIAGNOSIS — M6259 Muscle wasting and atrophy, not elsewhere classified, multiple sites: Secondary | ICD-10-CM | POA: Diagnosis not present

## 2021-08-29 DIAGNOSIS — I10 Essential (primary) hypertension: Secondary | ICD-10-CM | POA: Diagnosis not present

## 2021-08-29 DIAGNOSIS — R278 Other lack of coordination: Secondary | ICD-10-CM | POA: Diagnosis not present

## 2021-08-29 DIAGNOSIS — M6259 Muscle wasting and atrophy, not elsewhere classified, multiple sites: Secondary | ICD-10-CM | POA: Diagnosis not present

## 2021-08-29 DIAGNOSIS — R262 Difficulty in walking, not elsewhere classified: Secondary | ICD-10-CM | POA: Diagnosis not present

## 2021-09-02 DIAGNOSIS — M6259 Muscle wasting and atrophy, not elsewhere classified, multiple sites: Secondary | ICD-10-CM | POA: Diagnosis not present

## 2021-09-02 DIAGNOSIS — R278 Other lack of coordination: Secondary | ICD-10-CM | POA: Diagnosis not present

## 2021-09-02 DIAGNOSIS — R262 Difficulty in walking, not elsewhere classified: Secondary | ICD-10-CM | POA: Diagnosis not present

## 2021-09-03 DIAGNOSIS — M6259 Muscle wasting and atrophy, not elsewhere classified, multiple sites: Secondary | ICD-10-CM | POA: Diagnosis not present

## 2021-09-03 DIAGNOSIS — R278 Other lack of coordination: Secondary | ICD-10-CM | POA: Diagnosis not present

## 2021-09-03 DIAGNOSIS — R262 Difficulty in walking, not elsewhere classified: Secondary | ICD-10-CM | POA: Diagnosis not present

## 2021-09-04 DIAGNOSIS — S88111A Complete traumatic amputation at level between knee and ankle, right lower leg, initial encounter: Secondary | ICD-10-CM | POA: Diagnosis not present

## 2021-09-04 DIAGNOSIS — E441 Mild protein-calorie malnutrition: Secondary | ICD-10-CM | POA: Diagnosis not present

## 2021-09-04 DIAGNOSIS — R262 Difficulty in walking, not elsewhere classified: Secondary | ICD-10-CM | POA: Diagnosis not present

## 2021-09-04 DIAGNOSIS — R278 Other lack of coordination: Secondary | ICD-10-CM | POA: Diagnosis not present

## 2021-09-04 DIAGNOSIS — E119 Type 2 diabetes mellitus without complications: Secondary | ICD-10-CM | POA: Diagnosis not present

## 2021-09-04 DIAGNOSIS — R54 Age-related physical debility: Secondary | ICD-10-CM | POA: Diagnosis not present

## 2021-09-04 DIAGNOSIS — F02818 Dementia in other diseases classified elsewhere, unspecified severity, with other behavioral disturbance: Secondary | ICD-10-CM | POA: Diagnosis not present

## 2021-09-04 DIAGNOSIS — Z794 Long term (current) use of insulin: Secondary | ICD-10-CM | POA: Diagnosis not present

## 2021-09-04 DIAGNOSIS — G894 Chronic pain syndrome: Secondary | ICD-10-CM | POA: Diagnosis not present

## 2021-09-04 DIAGNOSIS — M6259 Muscle wasting and atrophy, not elsewhere classified, multiple sites: Secondary | ICD-10-CM | POA: Diagnosis not present

## 2021-09-04 DIAGNOSIS — R300 Dysuria: Secondary | ICD-10-CM | POA: Diagnosis not present

## 2021-09-04 DIAGNOSIS — G3183 Dementia with Lewy bodies: Secondary | ICD-10-CM | POA: Diagnosis not present

## 2021-09-05 DIAGNOSIS — R262 Difficulty in walking, not elsewhere classified: Secondary | ICD-10-CM | POA: Diagnosis not present

## 2021-09-05 DIAGNOSIS — R6889 Other general symptoms and signs: Secondary | ICD-10-CM | POA: Diagnosis not present

## 2021-09-05 DIAGNOSIS — M6259 Muscle wasting and atrophy, not elsewhere classified, multiple sites: Secondary | ICD-10-CM | POA: Diagnosis not present

## 2021-09-05 DIAGNOSIS — R278 Other lack of coordination: Secondary | ICD-10-CM | POA: Diagnosis not present

## 2021-09-06 DIAGNOSIS — R278 Other lack of coordination: Secondary | ICD-10-CM | POA: Diagnosis not present

## 2021-09-06 DIAGNOSIS — R262 Difficulty in walking, not elsewhere classified: Secondary | ICD-10-CM | POA: Diagnosis not present

## 2021-09-06 DIAGNOSIS — M6259 Muscle wasting and atrophy, not elsewhere classified, multiple sites: Secondary | ICD-10-CM | POA: Diagnosis not present

## 2021-09-09 DIAGNOSIS — R278 Other lack of coordination: Secondary | ICD-10-CM | POA: Diagnosis not present

## 2021-09-09 DIAGNOSIS — M6259 Muscle wasting and atrophy, not elsewhere classified, multiple sites: Secondary | ICD-10-CM | POA: Diagnosis not present

## 2021-09-09 DIAGNOSIS — B9620 Unspecified Escherichia coli [E. coli] as the cause of diseases classified elsewhere: Secondary | ICD-10-CM | POA: Diagnosis not present

## 2021-09-09 DIAGNOSIS — R262 Difficulty in walking, not elsewhere classified: Secondary | ICD-10-CM | POA: Diagnosis not present

## 2021-09-09 DIAGNOSIS — N3000 Acute cystitis without hematuria: Secondary | ICD-10-CM | POA: Diagnosis not present

## 2021-09-18 DIAGNOSIS — B9620 Unspecified Escherichia coli [E. coli] as the cause of diseases classified elsewhere: Secondary | ICD-10-CM | POA: Diagnosis not present

## 2021-09-18 DIAGNOSIS — F32A Depression, unspecified: Secondary | ICD-10-CM | POA: Diagnosis not present

## 2021-09-18 DIAGNOSIS — F02818 Dementia in other diseases classified elsewhere, unspecified severity, with other behavioral disturbance: Secondary | ICD-10-CM | POA: Diagnosis not present

## 2021-09-18 DIAGNOSIS — J988 Other specified respiratory disorders: Secondary | ICD-10-CM | POA: Diagnosis not present

## 2021-09-23 DIAGNOSIS — D1801 Hemangioma of skin and subcutaneous tissue: Secondary | ICD-10-CM | POA: Diagnosis not present

## 2021-09-23 DIAGNOSIS — Z79899 Other long term (current) drug therapy: Secondary | ICD-10-CM | POA: Diagnosis not present

## 2021-09-24 DIAGNOSIS — E119 Type 2 diabetes mellitus without complications: Secondary | ICD-10-CM | POA: Diagnosis not present

## 2021-09-24 DIAGNOSIS — F02818 Dementia in other diseases classified elsewhere, unspecified severity, with other behavioral disturbance: Secondary | ICD-10-CM | POA: Diagnosis not present

## 2021-09-24 DIAGNOSIS — N184 Chronic kidney disease, stage 4 (severe): Secondary | ICD-10-CM | POA: Diagnosis not present

## 2021-09-24 DIAGNOSIS — R300 Dysuria: Secondary | ICD-10-CM | POA: Diagnosis not present

## 2021-09-25 DIAGNOSIS — R300 Dysuria: Secondary | ICD-10-CM | POA: Diagnosis not present

## 2021-10-03 DIAGNOSIS — M6259 Muscle wasting and atrophy, not elsewhere classified, multiple sites: Secondary | ICD-10-CM | POA: Diagnosis not present

## 2021-10-03 DIAGNOSIS — L603 Nail dystrophy: Secondary | ICD-10-CM | POA: Diagnosis not present

## 2021-10-03 DIAGNOSIS — R262 Difficulty in walking, not elsewhere classified: Secondary | ICD-10-CM | POA: Diagnosis not present

## 2021-10-03 DIAGNOSIS — R278 Other lack of coordination: Secondary | ICD-10-CM | POA: Diagnosis not present

## 2021-10-03 DIAGNOSIS — E1140 Type 2 diabetes mellitus with diabetic neuropathy, unspecified: Secondary | ICD-10-CM | POA: Diagnosis not present

## 2021-10-03 DIAGNOSIS — L84 Corns and callosities: Secondary | ICD-10-CM | POA: Diagnosis not present

## 2021-10-04 DIAGNOSIS — R278 Other lack of coordination: Secondary | ICD-10-CM | POA: Diagnosis not present

## 2021-10-04 DIAGNOSIS — M6259 Muscle wasting and atrophy, not elsewhere classified, multiple sites: Secondary | ICD-10-CM | POA: Diagnosis not present

## 2021-10-04 DIAGNOSIS — R262 Difficulty in walking, not elsewhere classified: Secondary | ICD-10-CM | POA: Diagnosis not present

## 2021-10-07 DIAGNOSIS — R278 Other lack of coordination: Secondary | ICD-10-CM | POA: Diagnosis not present

## 2021-10-07 DIAGNOSIS — M6259 Muscle wasting and atrophy, not elsewhere classified, multiple sites: Secondary | ICD-10-CM | POA: Diagnosis not present

## 2021-10-07 DIAGNOSIS — R262 Difficulty in walking, not elsewhere classified: Secondary | ICD-10-CM | POA: Diagnosis not present

## 2021-10-08 DIAGNOSIS — B9620 Unspecified Escherichia coli [E. coli] as the cause of diseases classified elsewhere: Secondary | ICD-10-CM | POA: Diagnosis not present

## 2021-10-08 DIAGNOSIS — R262 Difficulty in walking, not elsewhere classified: Secondary | ICD-10-CM | POA: Diagnosis not present

## 2021-10-08 DIAGNOSIS — U071 COVID-19: Secondary | ICD-10-CM | POA: Diagnosis not present

## 2021-10-08 DIAGNOSIS — E119 Type 2 diabetes mellitus without complications: Secondary | ICD-10-CM | POA: Diagnosis not present

## 2021-10-08 DIAGNOSIS — N184 Chronic kidney disease, stage 4 (severe): Secondary | ICD-10-CM | POA: Diagnosis not present

## 2021-10-08 DIAGNOSIS — R278 Other lack of coordination: Secondary | ICD-10-CM | POA: Diagnosis not present

## 2021-10-08 DIAGNOSIS — M6259 Muscle wasting and atrophy, not elsewhere classified, multiple sites: Secondary | ICD-10-CM | POA: Diagnosis not present

## 2021-10-09 DIAGNOSIS — M6259 Muscle wasting and atrophy, not elsewhere classified, multiple sites: Secondary | ICD-10-CM | POA: Diagnosis not present

## 2021-10-09 DIAGNOSIS — R278 Other lack of coordination: Secondary | ICD-10-CM | POA: Diagnosis not present

## 2021-10-09 DIAGNOSIS — R262 Difficulty in walking, not elsewhere classified: Secondary | ICD-10-CM | POA: Diagnosis not present

## 2021-10-10 DIAGNOSIS — U071 COVID-19: Secondary | ICD-10-CM | POA: Diagnosis not present

## 2021-10-10 DIAGNOSIS — R278 Other lack of coordination: Secondary | ICD-10-CM | POA: Diagnosis not present

## 2021-10-10 DIAGNOSIS — R262 Difficulty in walking, not elsewhere classified: Secondary | ICD-10-CM | POA: Diagnosis not present

## 2021-10-10 DIAGNOSIS — M6259 Muscle wasting and atrophy, not elsewhere classified, multiple sites: Secondary | ICD-10-CM | POA: Diagnosis not present

## 2021-10-11 DIAGNOSIS — R262 Difficulty in walking, not elsewhere classified: Secondary | ICD-10-CM | POA: Diagnosis not present

## 2021-10-11 DIAGNOSIS — R278 Other lack of coordination: Secondary | ICD-10-CM | POA: Diagnosis not present

## 2021-10-11 DIAGNOSIS — M6259 Muscle wasting and atrophy, not elsewhere classified, multiple sites: Secondary | ICD-10-CM | POA: Diagnosis not present

## 2021-10-14 DIAGNOSIS — M6259 Muscle wasting and atrophy, not elsewhere classified, multiple sites: Secondary | ICD-10-CM | POA: Diagnosis not present

## 2021-10-14 DIAGNOSIS — R278 Other lack of coordination: Secondary | ICD-10-CM | POA: Diagnosis not present

## 2021-10-14 DIAGNOSIS — B999 Unspecified infectious disease: Secondary | ICD-10-CM | POA: Diagnosis not present

## 2021-10-14 DIAGNOSIS — R262 Difficulty in walking, not elsewhere classified: Secondary | ICD-10-CM | POA: Diagnosis not present

## 2021-10-16 DIAGNOSIS — R262 Difficulty in walking, not elsewhere classified: Secondary | ICD-10-CM | POA: Diagnosis not present

## 2021-10-16 DIAGNOSIS — R278 Other lack of coordination: Secondary | ICD-10-CM | POA: Diagnosis not present

## 2021-10-16 DIAGNOSIS — U071 COVID-19: Secondary | ICD-10-CM | POA: Diagnosis not present

## 2021-10-16 DIAGNOSIS — F419 Anxiety disorder, unspecified: Secondary | ICD-10-CM | POA: Diagnosis not present

## 2021-10-16 DIAGNOSIS — M6259 Muscle wasting and atrophy, not elsewhere classified, multiple sites: Secondary | ICD-10-CM | POA: Diagnosis not present

## 2021-10-17 DIAGNOSIS — R262 Difficulty in walking, not elsewhere classified: Secondary | ICD-10-CM | POA: Diagnosis not present

## 2021-10-17 DIAGNOSIS — M6259 Muscle wasting and atrophy, not elsewhere classified, multiple sites: Secondary | ICD-10-CM | POA: Diagnosis not present

## 2021-10-17 DIAGNOSIS — R278 Other lack of coordination: Secondary | ICD-10-CM | POA: Diagnosis not present

## 2021-10-18 DIAGNOSIS — R262 Difficulty in walking, not elsewhere classified: Secondary | ICD-10-CM | POA: Diagnosis not present

## 2021-10-18 DIAGNOSIS — R278 Other lack of coordination: Secondary | ICD-10-CM | POA: Diagnosis not present

## 2021-10-18 DIAGNOSIS — M6259 Muscle wasting and atrophy, not elsewhere classified, multiple sites: Secondary | ICD-10-CM | POA: Diagnosis not present

## 2021-10-21 DIAGNOSIS — E785 Hyperlipidemia, unspecified: Secondary | ICD-10-CM | POA: Diagnosis not present

## 2021-10-21 DIAGNOSIS — I10 Essential (primary) hypertension: Secondary | ICD-10-CM | POA: Diagnosis not present

## 2021-10-21 DIAGNOSIS — Z794 Long term (current) use of insulin: Secondary | ICD-10-CM | POA: Diagnosis not present

## 2021-10-21 DIAGNOSIS — E1169 Type 2 diabetes mellitus with other specified complication: Secondary | ICD-10-CM | POA: Diagnosis not present

## 2021-10-21 DIAGNOSIS — G4700 Insomnia, unspecified: Secondary | ICD-10-CM | POA: Diagnosis not present

## 2021-10-21 DIAGNOSIS — G8929 Other chronic pain: Secondary | ICD-10-CM | POA: Diagnosis not present

## 2021-10-21 DIAGNOSIS — M6259 Muscle wasting and atrophy, not elsewhere classified, multiple sites: Secondary | ICD-10-CM | POA: Diagnosis not present

## 2021-10-21 DIAGNOSIS — R278 Other lack of coordination: Secondary | ICD-10-CM | POA: Diagnosis not present

## 2021-10-21 DIAGNOSIS — R262 Difficulty in walking, not elsewhere classified: Secondary | ICD-10-CM | POA: Diagnosis not present

## 2021-10-22 DIAGNOSIS — M6259 Muscle wasting and atrophy, not elsewhere classified, multiple sites: Secondary | ICD-10-CM | POA: Diagnosis not present

## 2021-10-22 DIAGNOSIS — R262 Difficulty in walking, not elsewhere classified: Secondary | ICD-10-CM | POA: Diagnosis not present

## 2021-10-22 DIAGNOSIS — R278 Other lack of coordination: Secondary | ICD-10-CM | POA: Diagnosis not present

## 2021-10-23 DIAGNOSIS — R262 Difficulty in walking, not elsewhere classified: Secondary | ICD-10-CM | POA: Diagnosis not present

## 2021-10-23 DIAGNOSIS — M6259 Muscle wasting and atrophy, not elsewhere classified, multiple sites: Secondary | ICD-10-CM | POA: Diagnosis not present

## 2021-10-23 DIAGNOSIS — R278 Other lack of coordination: Secondary | ICD-10-CM | POA: Diagnosis not present

## 2021-10-24 DIAGNOSIS — R262 Difficulty in walking, not elsewhere classified: Secondary | ICD-10-CM | POA: Diagnosis not present

## 2021-10-24 DIAGNOSIS — M6259 Muscle wasting and atrophy, not elsewhere classified, multiple sites: Secondary | ICD-10-CM | POA: Diagnosis not present

## 2021-10-24 DIAGNOSIS — R278 Other lack of coordination: Secondary | ICD-10-CM | POA: Diagnosis not present

## 2021-10-25 DIAGNOSIS — M6259 Muscle wasting and atrophy, not elsewhere classified, multiple sites: Secondary | ICD-10-CM | POA: Diagnosis not present

## 2021-10-25 DIAGNOSIS — R262 Difficulty in walking, not elsewhere classified: Secondary | ICD-10-CM | POA: Diagnosis not present

## 2021-10-25 DIAGNOSIS — R278 Other lack of coordination: Secondary | ICD-10-CM | POA: Diagnosis not present

## 2021-10-28 DIAGNOSIS — R278 Other lack of coordination: Secondary | ICD-10-CM | POA: Diagnosis not present

## 2021-10-28 DIAGNOSIS — M6259 Muscle wasting and atrophy, not elsewhere classified, multiple sites: Secondary | ICD-10-CM | POA: Diagnosis not present

## 2021-10-28 DIAGNOSIS — R262 Difficulty in walking, not elsewhere classified: Secondary | ICD-10-CM | POA: Diagnosis not present

## 2021-10-29 DIAGNOSIS — R262 Difficulty in walking, not elsewhere classified: Secondary | ICD-10-CM | POA: Diagnosis not present

## 2021-10-29 DIAGNOSIS — M6259 Muscle wasting and atrophy, not elsewhere classified, multiple sites: Secondary | ICD-10-CM | POA: Diagnosis not present

## 2021-10-29 DIAGNOSIS — R278 Other lack of coordination: Secondary | ICD-10-CM | POA: Diagnosis not present

## 2021-10-30 DIAGNOSIS — F29 Unspecified psychosis not due to a substance or known physiological condition: Secondary | ICD-10-CM | POA: Diagnosis not present

## 2021-10-30 DIAGNOSIS — I10 Essential (primary) hypertension: Secondary | ICD-10-CM | POA: Diagnosis not present

## 2021-11-04 DIAGNOSIS — E08311 Diabetes mellitus due to underlying condition with unspecified diabetic retinopathy with macular edema: Secondary | ICD-10-CM | POA: Diagnosis not present

## 2021-11-04 DIAGNOSIS — R7309 Other abnormal glucose: Secondary | ICD-10-CM | POA: Diagnosis not present

## 2021-11-05 DIAGNOSIS — L03116 Cellulitis of left lower limb: Secondary | ICD-10-CM | POA: Diagnosis not present

## 2021-11-05 DIAGNOSIS — I739 Peripheral vascular disease, unspecified: Secondary | ICD-10-CM | POA: Diagnosis not present

## 2021-11-05 DIAGNOSIS — E1169 Type 2 diabetes mellitus with other specified complication: Secondary | ICD-10-CM | POA: Diagnosis not present

## 2021-11-07 DIAGNOSIS — R6889 Other general symptoms and signs: Secondary | ICD-10-CM | POA: Diagnosis not present

## 2021-11-07 DIAGNOSIS — R300 Dysuria: Secondary | ICD-10-CM | POA: Diagnosis not present

## 2021-11-08 DIAGNOSIS — R6889 Other general symptoms and signs: Secondary | ICD-10-CM | POA: Diagnosis not present

## 2021-11-08 DIAGNOSIS — R300 Dysuria: Secondary | ICD-10-CM | POA: Diagnosis not present

## 2021-11-11 DIAGNOSIS — F32A Depression, unspecified: Secondary | ICD-10-CM | POA: Diagnosis not present

## 2021-11-11 DIAGNOSIS — F29 Unspecified psychosis not due to a substance or known physiological condition: Secondary | ICD-10-CM | POA: Diagnosis not present

## 2021-11-11 DIAGNOSIS — G3183 Dementia with Lewy bodies: Secondary | ICD-10-CM | POA: Diagnosis not present

## 2021-11-12 DIAGNOSIS — L03116 Cellulitis of left lower limb: Secondary | ICD-10-CM | POA: Diagnosis not present

## 2021-11-12 DIAGNOSIS — E1169 Type 2 diabetes mellitus with other specified complication: Secondary | ICD-10-CM | POA: Diagnosis not present

## 2021-11-12 DIAGNOSIS — I739 Peripheral vascular disease, unspecified: Secondary | ICD-10-CM | POA: Diagnosis not present

## 2021-11-25 DIAGNOSIS — L03116 Cellulitis of left lower limb: Secondary | ICD-10-CM | POA: Diagnosis not present

## 2021-11-25 DIAGNOSIS — E1169 Type 2 diabetes mellitus with other specified complication: Secondary | ICD-10-CM | POA: Diagnosis not present

## 2021-11-25 DIAGNOSIS — S88111A Complete traumatic amputation at level between knee and ankle, right lower leg, initial encounter: Secondary | ICD-10-CM | POA: Diagnosis not present

## 2021-11-25 DIAGNOSIS — R600 Localized edema: Secondary | ICD-10-CM | POA: Diagnosis not present

## 2021-11-25 DIAGNOSIS — I739 Peripheral vascular disease, unspecified: Secondary | ICD-10-CM | POA: Diagnosis not present

## 2021-11-26 DIAGNOSIS — M79605 Pain in left leg: Secondary | ICD-10-CM | POA: Diagnosis not present

## 2021-11-29 DIAGNOSIS — L03116 Cellulitis of left lower limb: Secondary | ICD-10-CM | POA: Diagnosis not present

## 2021-11-29 DIAGNOSIS — F02818 Dementia in other diseases classified elsewhere, unspecified severity, with other behavioral disturbance: Secondary | ICD-10-CM | POA: Diagnosis not present

## 2021-11-29 DIAGNOSIS — R600 Localized edema: Secondary | ICD-10-CM | POA: Diagnosis not present

## 2021-11-29 DIAGNOSIS — N184 Chronic kidney disease, stage 4 (severe): Secondary | ICD-10-CM | POA: Diagnosis not present

## 2021-12-02 DIAGNOSIS — R600 Localized edema: Secondary | ICD-10-CM | POA: Diagnosis not present

## 2021-12-02 DIAGNOSIS — R059 Cough, unspecified: Secondary | ICD-10-CM | POA: Diagnosis not present

## 2021-12-02 DIAGNOSIS — L03116 Cellulitis of left lower limb: Secondary | ICD-10-CM | POA: Diagnosis not present

## 2021-12-02 DIAGNOSIS — J984 Other disorders of lung: Secondary | ICD-10-CM | POA: Diagnosis not present

## 2021-12-02 DIAGNOSIS — N184 Chronic kidney disease, stage 4 (severe): Secondary | ICD-10-CM | POA: Diagnosis not present

## 2021-12-04 DIAGNOSIS — E785 Hyperlipidemia, unspecified: Secondary | ICD-10-CM | POA: Diagnosis not present

## 2021-12-04 DIAGNOSIS — G894 Chronic pain syndrome: Secondary | ICD-10-CM | POA: Diagnosis not present

## 2021-12-04 DIAGNOSIS — N189 Chronic kidney disease, unspecified: Secondary | ICD-10-CM | POA: Diagnosis not present

## 2021-12-04 DIAGNOSIS — L959 Vasculitis limited to the skin, unspecified: Secondary | ICD-10-CM | POA: Diagnosis not present

## 2021-12-04 DIAGNOSIS — I10 Essential (primary) hypertension: Secondary | ICD-10-CM | POA: Diagnosis not present

## 2021-12-04 DIAGNOSIS — G4700 Insomnia, unspecified: Secondary | ICD-10-CM | POA: Diagnosis not present

## 2021-12-04 DIAGNOSIS — J189 Pneumonia, unspecified organism: Secondary | ICD-10-CM | POA: Diagnosis not present

## 2021-12-04 DIAGNOSIS — F02818 Dementia in other diseases classified elsewhere, unspecified severity, with other behavioral disturbance: Secondary | ICD-10-CM | POA: Diagnosis not present

## 2021-12-04 DIAGNOSIS — E119 Type 2 diabetes mellitus without complications: Secondary | ICD-10-CM | POA: Diagnosis not present

## 2021-12-05 DIAGNOSIS — J189 Pneumonia, unspecified organism: Secondary | ICD-10-CM | POA: Diagnosis not present

## 2021-12-05 DIAGNOSIS — L959 Vasculitis limited to the skin, unspecified: Secondary | ICD-10-CM | POA: Diagnosis not present

## 2021-12-05 DIAGNOSIS — R600 Localized edema: Secondary | ICD-10-CM | POA: Diagnosis not present

## 2021-12-05 DIAGNOSIS — M79605 Pain in left leg: Secondary | ICD-10-CM | POA: Diagnosis not present

## 2021-12-05 DIAGNOSIS — I10 Essential (primary) hypertension: Secondary | ICD-10-CM | POA: Diagnosis not present

## 2021-12-05 DIAGNOSIS — N184 Chronic kidney disease, stage 4 (severe): Secondary | ICD-10-CM | POA: Diagnosis not present

## 2021-12-09 DIAGNOSIS — R6889 Other general symptoms and signs: Secondary | ICD-10-CM | POA: Diagnosis not present

## 2021-12-09 DIAGNOSIS — B999 Unspecified infectious disease: Secondary | ICD-10-CM | POA: Diagnosis not present

## 2021-12-10 DIAGNOSIS — N1832 Chronic kidney disease, stage 3b: Secondary | ICD-10-CM | POA: Diagnosis not present

## 2021-12-10 DIAGNOSIS — E538 Deficiency of other specified B group vitamins: Secondary | ICD-10-CM | POA: Diagnosis not present

## 2021-12-10 DIAGNOSIS — D72829 Elevated white blood cell count, unspecified: Secondary | ICD-10-CM | POA: Diagnosis not present

## 2021-12-10 DIAGNOSIS — M6281 Muscle weakness (generalized): Secondary | ICD-10-CM | POA: Diagnosis not present

## 2021-12-11 DIAGNOSIS — M6281 Muscle weakness (generalized): Secondary | ICD-10-CM | POA: Diagnosis not present

## 2021-12-12 DIAGNOSIS — M6281 Muscle weakness (generalized): Secondary | ICD-10-CM | POA: Diagnosis not present

## 2021-12-13 DIAGNOSIS — M6281 Muscle weakness (generalized): Secondary | ICD-10-CM | POA: Diagnosis not present

## 2021-12-16 DIAGNOSIS — K219 Gastro-esophageal reflux disease without esophagitis: Secondary | ICD-10-CM | POA: Diagnosis not present

## 2021-12-16 DIAGNOSIS — I10 Essential (primary) hypertension: Secondary | ICD-10-CM | POA: Diagnosis not present

## 2021-12-16 DIAGNOSIS — J189 Pneumonia, unspecified organism: Secondary | ICD-10-CM | POA: Diagnosis not present

## 2021-12-16 DIAGNOSIS — M6281 Muscle weakness (generalized): Secondary | ICD-10-CM | POA: Diagnosis not present

## 2021-12-16 DIAGNOSIS — R600 Localized edema: Secondary | ICD-10-CM | POA: Diagnosis not present

## 2021-12-17 DIAGNOSIS — M6281 Muscle weakness (generalized): Secondary | ICD-10-CM | POA: Diagnosis not present

## 2021-12-18 DIAGNOSIS — M6281 Muscle weakness (generalized): Secondary | ICD-10-CM | POA: Diagnosis not present

## 2021-12-18 DIAGNOSIS — L84 Corns and callosities: Secondary | ICD-10-CM | POA: Diagnosis not present

## 2021-12-18 DIAGNOSIS — I739 Peripheral vascular disease, unspecified: Secondary | ICD-10-CM | POA: Diagnosis not present

## 2021-12-18 DIAGNOSIS — B351 Tinea unguium: Secondary | ICD-10-CM | POA: Diagnosis not present

## 2021-12-19 DIAGNOSIS — J189 Pneumonia, unspecified organism: Secondary | ICD-10-CM | POA: Diagnosis not present

## 2021-12-19 DIAGNOSIS — F02818 Dementia in other diseases classified elsewhere, unspecified severity, with other behavioral disturbance: Secondary | ICD-10-CM | POA: Diagnosis not present

## 2021-12-19 DIAGNOSIS — M6281 Muscle weakness (generalized): Secondary | ICD-10-CM | POA: Diagnosis not present

## 2021-12-20 DIAGNOSIS — M6281 Muscle weakness (generalized): Secondary | ICD-10-CM | POA: Diagnosis not present

## 2021-12-23 DIAGNOSIS — R6889 Other general symptoms and signs: Secondary | ICD-10-CM | POA: Diagnosis not present

## 2021-12-23 DIAGNOSIS — B999 Unspecified infectious disease: Secondary | ICD-10-CM | POA: Diagnosis not present

## 2021-12-23 DIAGNOSIS — M6281 Muscle weakness (generalized): Secondary | ICD-10-CM | POA: Diagnosis not present

## 2021-12-25 DIAGNOSIS — M6281 Muscle weakness (generalized): Secondary | ICD-10-CM | POA: Diagnosis not present

## 2021-12-25 DIAGNOSIS — I509 Heart failure, unspecified: Secondary | ICD-10-CM | POA: Diagnosis not present

## 2021-12-25 DIAGNOSIS — F32A Depression, unspecified: Secondary | ICD-10-CM | POA: Diagnosis not present

## 2021-12-25 DIAGNOSIS — F0390 Unspecified dementia without behavioral disturbance: Secondary | ICD-10-CM | POA: Diagnosis not present

## 2021-12-26 DIAGNOSIS — N1832 Chronic kidney disease, stage 3b: Secondary | ICD-10-CM | POA: Diagnosis not present

## 2021-12-26 DIAGNOSIS — K219 Gastro-esophageal reflux disease without esophagitis: Secondary | ICD-10-CM | POA: Diagnosis not present

## 2021-12-26 DIAGNOSIS — I509 Heart failure, unspecified: Secondary | ICD-10-CM | POA: Diagnosis not present

## 2021-12-26 DIAGNOSIS — G3183 Dementia with Lewy bodies: Secondary | ICD-10-CM | POA: Diagnosis not present

## 2021-12-26 DIAGNOSIS — M6281 Muscle weakness (generalized): Secondary | ICD-10-CM | POA: Diagnosis not present

## 2021-12-26 DIAGNOSIS — F02818 Dementia in other diseases classified elsewhere, unspecified severity, with other behavioral disturbance: Secondary | ICD-10-CM | POA: Diagnosis not present

## 2021-12-27 DIAGNOSIS — M6281 Muscle weakness (generalized): Secondary | ICD-10-CM | POA: Diagnosis not present

## 2021-12-30 DIAGNOSIS — M6281 Muscle weakness (generalized): Secondary | ICD-10-CM | POA: Diagnosis not present

## 2022-01-03 DIAGNOSIS — G3183 Dementia with Lewy bodies: Secondary | ICD-10-CM | POA: Diagnosis not present

## 2022-01-03 DIAGNOSIS — N184 Chronic kidney disease, stage 4 (severe): Secondary | ICD-10-CM | POA: Diagnosis not present

## 2022-01-03 DIAGNOSIS — F02818 Dementia in other diseases classified elsewhere, unspecified severity, with other behavioral disturbance: Secondary | ICD-10-CM | POA: Diagnosis not present

## 2022-01-03 DIAGNOSIS — I509 Heart failure, unspecified: Secondary | ICD-10-CM | POA: Diagnosis not present

## 2022-01-03 DIAGNOSIS — R600 Localized edema: Secondary | ICD-10-CM | POA: Diagnosis not present

## 2022-01-07 DIAGNOSIS — G3183 Dementia with Lewy bodies: Secondary | ICD-10-CM | POA: Diagnosis not present

## 2022-01-07 DIAGNOSIS — F0280 Dementia in other diseases classified elsewhere without behavioral disturbance: Secondary | ICD-10-CM | POA: Diagnosis not present

## 2022-01-07 DIAGNOSIS — F419 Anxiety disorder, unspecified: Secondary | ICD-10-CM | POA: Diagnosis not present

## 2022-01-09 DIAGNOSIS — K219 Gastro-esophageal reflux disease without esophagitis: Secondary | ICD-10-CM | POA: Diagnosis not present

## 2022-01-09 DIAGNOSIS — N184 Chronic kidney disease, stage 4 (severe): Secondary | ICD-10-CM | POA: Diagnosis not present

## 2022-01-09 DIAGNOSIS — I10 Essential (primary) hypertension: Secondary | ICD-10-CM | POA: Diagnosis not present

## 2022-01-09 DIAGNOSIS — I509 Heart failure, unspecified: Secondary | ICD-10-CM | POA: Diagnosis not present

## 2022-01-14 DIAGNOSIS — R21 Rash and other nonspecific skin eruption: Secondary | ICD-10-CM | POA: Diagnosis not present

## 2022-01-14 DIAGNOSIS — I509 Heart failure, unspecified: Secondary | ICD-10-CM | POA: Diagnosis not present

## 2022-01-14 DIAGNOSIS — J984 Other disorders of lung: Secondary | ICD-10-CM | POA: Diagnosis not present

## 2022-01-15 DIAGNOSIS — L299 Pruritus, unspecified: Secondary | ICD-10-CM | POA: Diagnosis not present

## 2022-01-15 DIAGNOSIS — L03116 Cellulitis of left lower limb: Secondary | ICD-10-CM | POA: Diagnosis not present

## 2022-01-22 DIAGNOSIS — E119 Type 2 diabetes mellitus without complications: Secondary | ICD-10-CM | POA: Diagnosis not present

## 2022-01-22 DIAGNOSIS — Z961 Presence of intraocular lens: Secondary | ICD-10-CM | POA: Diagnosis not present

## 2022-01-22 DIAGNOSIS — H524 Presbyopia: Secondary | ICD-10-CM | POA: Diagnosis not present

## 2022-01-22 DIAGNOSIS — L03116 Cellulitis of left lower limb: Secondary | ICD-10-CM | POA: Diagnosis not present

## 2022-01-22 DIAGNOSIS — Z794 Long term (current) use of insulin: Secondary | ICD-10-CM | POA: Diagnosis not present

## 2022-01-22 DIAGNOSIS — Z7984 Long term (current) use of oral hypoglycemic drugs: Secondary | ICD-10-CM | POA: Diagnosis not present

## 2022-01-23 DIAGNOSIS — Z79899 Other long term (current) drug therapy: Secondary | ICD-10-CM | POA: Diagnosis not present

## 2022-02-03 DIAGNOSIS — R5383 Other fatigue: Secondary | ICD-10-CM | POA: Diagnosis not present

## 2022-02-06 DIAGNOSIS — F0390 Unspecified dementia without behavioral disturbance: Secondary | ICD-10-CM | POA: Diagnosis not present

## 2022-02-06 DIAGNOSIS — R112 Nausea with vomiting, unspecified: Secondary | ICD-10-CM | POA: Diagnosis not present

## 2022-02-06 DIAGNOSIS — I10 Essential (primary) hypertension: Secondary | ICD-10-CM | POA: Diagnosis not present

## 2022-02-06 DIAGNOSIS — K859 Acute pancreatitis without necrosis or infection, unspecified: Secondary | ICD-10-CM | POA: Diagnosis not present

## 2022-02-06 DIAGNOSIS — D72829 Elevated white blood cell count, unspecified: Secondary | ICD-10-CM | POA: Diagnosis not present

## 2022-02-06 DIAGNOSIS — E119 Type 2 diabetes mellitus without complications: Secondary | ICD-10-CM | POA: Diagnosis not present

## 2022-02-06 DIAGNOSIS — Z794 Long term (current) use of insulin: Secondary | ICD-10-CM | POA: Diagnosis not present

## 2022-02-06 DIAGNOSIS — L03116 Cellulitis of left lower limb: Secondary | ICD-10-CM | POA: Diagnosis not present

## 2022-02-06 DIAGNOSIS — K8500 Idiopathic acute pancreatitis without necrosis or infection: Secondary | ICD-10-CM | POA: Diagnosis not present

## 2022-02-06 DIAGNOSIS — Z89511 Acquired absence of right leg below knee: Secondary | ICD-10-CM | POA: Diagnosis not present

## 2022-02-06 DIAGNOSIS — K2970 Gastritis, unspecified, without bleeding: Secondary | ICD-10-CM | POA: Diagnosis not present

## 2022-02-06 DIAGNOSIS — R4182 Altered mental status, unspecified: Secondary | ICD-10-CM | POA: Diagnosis not present

## 2022-02-06 DIAGNOSIS — R531 Weakness: Secondary | ICD-10-CM | POA: Diagnosis not present

## 2022-02-06 DIAGNOSIS — K858 Other acute pancreatitis without necrosis or infection: Secondary | ICD-10-CM | POA: Diagnosis not present

## 2022-02-06 DIAGNOSIS — T797XXA Traumatic subcutaneous emphysema, initial encounter: Secondary | ICD-10-CM | POA: Diagnosis not present

## 2022-02-06 DIAGNOSIS — N179 Acute kidney failure, unspecified: Secondary | ICD-10-CM | POA: Diagnosis not present

## 2022-02-06 DIAGNOSIS — G9341 Metabolic encephalopathy: Secondary | ICD-10-CM | POA: Diagnosis not present

## 2022-02-06 DIAGNOSIS — R0602 Shortness of breath: Secondary | ICD-10-CM | POA: Diagnosis not present

## 2022-02-06 DIAGNOSIS — K219 Gastro-esophageal reflux disease without esophagitis: Secondary | ICD-10-CM | POA: Diagnosis not present

## 2022-02-07 DIAGNOSIS — N179 Acute kidney failure, unspecified: Secondary | ICD-10-CM | POA: Diagnosis not present

## 2022-02-07 DIAGNOSIS — D72829 Elevated white blood cell count, unspecified: Secondary | ICD-10-CM | POA: Diagnosis not present

## 2022-02-11 DIAGNOSIS — F32A Depression, unspecified: Secondary | ICD-10-CM | POA: Diagnosis not present

## 2022-02-11 DIAGNOSIS — F419 Anxiety disorder, unspecified: Secondary | ICD-10-CM | POA: Diagnosis not present

## 2022-02-11 DIAGNOSIS — G3183 Dementia with Lewy bodies: Secondary | ICD-10-CM | POA: Diagnosis not present

## 2022-02-11 DIAGNOSIS — K859 Acute pancreatitis without necrosis or infection, unspecified: Secondary | ICD-10-CM | POA: Diagnosis not present

## 2022-02-11 DIAGNOSIS — F02818 Dementia in other diseases classified elsewhere, unspecified severity, with other behavioral disturbance: Secondary | ICD-10-CM | POA: Diagnosis not present

## 2022-02-11 DIAGNOSIS — N1832 Chronic kidney disease, stage 3b: Secondary | ICD-10-CM | POA: Diagnosis not present

## 2022-02-11 DIAGNOSIS — R54 Age-related physical debility: Secondary | ICD-10-CM | POA: Diagnosis not present

## 2022-02-11 DIAGNOSIS — I509 Heart failure, unspecified: Secondary | ICD-10-CM | POA: Diagnosis not present

## 2022-02-12 DIAGNOSIS — F0390 Unspecified dementia without behavioral disturbance: Secondary | ICD-10-CM | POA: Diagnosis not present

## 2022-02-12 DIAGNOSIS — R69 Illness, unspecified: Secondary | ICD-10-CM | POA: Diagnosis not present

## 2022-02-12 DIAGNOSIS — R270 Ataxia, unspecified: Secondary | ICD-10-CM | POA: Diagnosis not present

## 2022-02-12 DIAGNOSIS — E46 Unspecified protein-calorie malnutrition: Secondary | ICD-10-CM | POA: Diagnosis not present

## 2022-02-12 DIAGNOSIS — K859 Acute pancreatitis without necrosis or infection, unspecified: Secondary | ICD-10-CM | POA: Diagnosis not present

## 2022-02-12 DIAGNOSIS — R5381 Other malaise: Secondary | ICD-10-CM | POA: Diagnosis not present

## 2022-02-12 DIAGNOSIS — N189 Chronic kidney disease, unspecified: Secondary | ICD-10-CM | POA: Diagnosis not present

## 2022-02-12 DIAGNOSIS — D649 Anemia, unspecified: Secondary | ICD-10-CM | POA: Diagnosis not present

## 2022-02-13 DIAGNOSIS — M6281 Muscle weakness (generalized): Secondary | ICD-10-CM | POA: Diagnosis not present

## 2022-02-13 DIAGNOSIS — R5381 Other malaise: Secondary | ICD-10-CM | POA: Diagnosis not present

## 2022-02-13 DIAGNOSIS — J90 Pleural effusion, not elsewhere classified: Secondary | ICD-10-CM | POA: Diagnosis not present

## 2022-02-13 DIAGNOSIS — K859 Acute pancreatitis without necrosis or infection, unspecified: Secondary | ICD-10-CM | POA: Diagnosis not present

## 2022-02-13 DIAGNOSIS — R062 Wheezing: Secondary | ICD-10-CM | POA: Diagnosis not present

## 2022-02-13 DIAGNOSIS — M25512 Pain in left shoulder: Secondary | ICD-10-CM | POA: Diagnosis not present

## 2022-02-13 DIAGNOSIS — J984 Other disorders of lung: Secondary | ICD-10-CM | POA: Diagnosis not present

## 2022-02-14 DIAGNOSIS — M6281 Muscle weakness (generalized): Secondary | ICD-10-CM | POA: Diagnosis not present

## 2022-02-14 DIAGNOSIS — I509 Heart failure, unspecified: Secondary | ICD-10-CM | POA: Diagnosis not present

## 2022-02-14 DIAGNOSIS — Z79899 Other long term (current) drug therapy: Secondary | ICD-10-CM | POA: Diagnosis not present

## 2022-02-17 DIAGNOSIS — L853 Xerosis cutis: Secondary | ICD-10-CM | POA: Diagnosis not present

## 2022-02-17 DIAGNOSIS — E1151 Type 2 diabetes mellitus with diabetic peripheral angiopathy without gangrene: Secondary | ICD-10-CM | POA: Diagnosis not present

## 2022-02-17 DIAGNOSIS — L209 Atopic dermatitis, unspecified: Secondary | ICD-10-CM | POA: Diagnosis not present

## 2022-02-17 DIAGNOSIS — B351 Tinea unguium: Secondary | ICD-10-CM | POA: Diagnosis not present

## 2022-02-17 DIAGNOSIS — M6281 Muscle weakness (generalized): Secondary | ICD-10-CM | POA: Diagnosis not present

## 2022-02-18 DIAGNOSIS — E119 Type 2 diabetes mellitus without complications: Secondary | ICD-10-CM | POA: Diagnosis not present

## 2022-02-18 DIAGNOSIS — I509 Heart failure, unspecified: Secondary | ICD-10-CM | POA: Diagnosis not present

## 2022-02-20 DIAGNOSIS — M6281 Muscle weakness (generalized): Secondary | ICD-10-CM | POA: Diagnosis not present

## 2022-02-24 DIAGNOSIS — M6281 Muscle weakness (generalized): Secondary | ICD-10-CM | POA: Diagnosis not present

## 2022-02-25 DIAGNOSIS — I509 Heart failure, unspecified: Secondary | ICD-10-CM | POA: Diagnosis not present

## 2022-02-25 DIAGNOSIS — E119 Type 2 diabetes mellitus without complications: Secondary | ICD-10-CM | POA: Diagnosis not present

## 2022-02-25 DIAGNOSIS — J984 Other disorders of lung: Secondary | ICD-10-CM | POA: Diagnosis not present

## 2022-02-25 DIAGNOSIS — M6281 Muscle weakness (generalized): Secondary | ICD-10-CM | POA: Diagnosis not present

## 2022-02-25 DIAGNOSIS — R5383 Other fatigue: Secondary | ICD-10-CM | POA: Diagnosis not present

## 2022-02-26 DIAGNOSIS — M6281 Muscle weakness (generalized): Secondary | ICD-10-CM | POA: Diagnosis not present

## 2022-02-26 DIAGNOSIS — Z79899 Other long term (current) drug therapy: Secondary | ICD-10-CM | POA: Diagnosis not present

## 2022-02-27 DIAGNOSIS — R109 Unspecified abdominal pain: Secondary | ICD-10-CM | POA: Diagnosis not present

## 2022-02-27 DIAGNOSIS — M6281 Muscle weakness (generalized): Secondary | ICD-10-CM | POA: Diagnosis not present

## 2022-02-27 DIAGNOSIS — E119 Type 2 diabetes mellitus without complications: Secondary | ICD-10-CM | POA: Diagnosis not present

## 2022-02-27 DIAGNOSIS — F02818 Dementia in other diseases classified elsewhere, unspecified severity, with other behavioral disturbance: Secondary | ICD-10-CM | POA: Diagnosis not present

## 2022-02-27 DIAGNOSIS — N1832 Chronic kidney disease, stage 3b: Secondary | ICD-10-CM | POA: Diagnosis not present

## 2022-02-28 DIAGNOSIS — R1011 Right upper quadrant pain: Secondary | ICD-10-CM | POA: Diagnosis not present

## 2022-03-04 DIAGNOSIS — M6281 Muscle weakness (generalized): Secondary | ICD-10-CM | POA: Diagnosis not present

## 2022-03-05 DIAGNOSIS — M6281 Muscle weakness (generalized): Secondary | ICD-10-CM | POA: Diagnosis not present

## 2022-03-06 DIAGNOSIS — R531 Weakness: Secondary | ICD-10-CM | POA: Diagnosis not present

## 2022-03-06 DIAGNOSIS — E1169 Type 2 diabetes mellitus with other specified complication: Secondary | ICD-10-CM | POA: Diagnosis not present

## 2022-03-06 DIAGNOSIS — R350 Frequency of micturition: Secondary | ICD-10-CM | POA: Diagnosis not present

## 2022-03-06 DIAGNOSIS — R112 Nausea with vomiting, unspecified: Secondary | ICD-10-CM | POA: Diagnosis not present

## 2022-03-06 DIAGNOSIS — N189 Chronic kidney disease, unspecified: Secondary | ICD-10-CM | POA: Diagnosis not present

## 2022-03-07 DIAGNOSIS — Z79899 Other long term (current) drug therapy: Secondary | ICD-10-CM | POA: Diagnosis not present

## 2022-03-07 DIAGNOSIS — R300 Dysuria: Secondary | ICD-10-CM | POA: Diagnosis not present

## 2022-03-07 DIAGNOSIS — I509 Heart failure, unspecified: Secondary | ICD-10-CM | POA: Diagnosis not present

## 2022-03-07 DIAGNOSIS — M6281 Muscle weakness (generalized): Secondary | ICD-10-CM | POA: Diagnosis not present

## 2022-03-07 DIAGNOSIS — N1832 Chronic kidney disease, stage 3b: Secondary | ICD-10-CM | POA: Diagnosis not present

## 2022-03-07 DIAGNOSIS — F0390 Unspecified dementia without behavioral disturbance: Secondary | ICD-10-CM | POA: Diagnosis not present

## 2022-03-08 DIAGNOSIS — Z79899 Other long term (current) drug therapy: Secondary | ICD-10-CM | POA: Diagnosis not present

## 2022-03-08 DIAGNOSIS — R300 Dysuria: Secondary | ICD-10-CM | POA: Diagnosis not present

## 2022-03-10 DIAGNOSIS — M6281 Muscle weakness (generalized): Secondary | ICD-10-CM | POA: Diagnosis not present

## 2022-03-11 DIAGNOSIS — M6281 Muscle weakness (generalized): Secondary | ICD-10-CM | POA: Diagnosis not present

## 2022-03-12 DIAGNOSIS — M6281 Muscle weakness (generalized): Secondary | ICD-10-CM | POA: Diagnosis not present

## 2022-03-14 DIAGNOSIS — M6281 Muscle weakness (generalized): Secondary | ICD-10-CM | POA: Diagnosis not present

## 2022-03-14 DIAGNOSIS — R350 Frequency of micturition: Secondary | ICD-10-CM | POA: Diagnosis not present

## 2022-03-14 DIAGNOSIS — E1169 Type 2 diabetes mellitus with other specified complication: Secondary | ICD-10-CM | POA: Diagnosis not present

## 2022-03-14 DIAGNOSIS — R112 Nausea with vomiting, unspecified: Secondary | ICD-10-CM | POA: Diagnosis not present

## 2022-03-14 DIAGNOSIS — I509 Heart failure, unspecified: Secondary | ICD-10-CM | POA: Diagnosis not present

## 2022-03-18 DIAGNOSIS — J300 Vasomotor rhinitis: Secondary | ICD-10-CM | POA: Diagnosis not present

## 2022-03-18 DIAGNOSIS — F0390 Unspecified dementia without behavioral disturbance: Secondary | ICD-10-CM | POA: Diagnosis not present

## 2022-03-18 DIAGNOSIS — N189 Chronic kidney disease, unspecified: Secondary | ICD-10-CM | POA: Diagnosis not present

## 2022-03-18 DIAGNOSIS — I509 Heart failure, unspecified: Secondary | ICD-10-CM | POA: Diagnosis not present

## 2022-03-18 DIAGNOSIS — E785 Hyperlipidemia, unspecified: Secondary | ICD-10-CM | POA: Diagnosis not present

## 2022-03-24 DIAGNOSIS — Z79899 Other long term (current) drug therapy: Secondary | ICD-10-CM | POA: Diagnosis not present

## 2022-04-02 DIAGNOSIS — F0390 Unspecified dementia without behavioral disturbance: Secondary | ICD-10-CM | POA: Diagnosis not present

## 2022-04-02 DIAGNOSIS — E785 Hyperlipidemia, unspecified: Secondary | ICD-10-CM | POA: Diagnosis not present

## 2022-04-02 DIAGNOSIS — I10 Essential (primary) hypertension: Secondary | ICD-10-CM | POA: Diagnosis not present

## 2022-04-02 DIAGNOSIS — R270 Ataxia, unspecified: Secondary | ICD-10-CM | POA: Diagnosis not present

## 2022-04-08 DIAGNOSIS — F5105 Insomnia due to other mental disorder: Secondary | ICD-10-CM | POA: Diagnosis not present

## 2022-04-08 DIAGNOSIS — F02A2 Dementia in other diseases classified elsewhere, mild, with psychotic disturbance: Secondary | ICD-10-CM | POA: Diagnosis not present

## 2022-04-08 DIAGNOSIS — F02818 Dementia in other diseases classified elsewhere, unspecified severity, with other behavioral disturbance: Secondary | ICD-10-CM | POA: Diagnosis not present

## 2022-04-08 DIAGNOSIS — F329 Major depressive disorder, single episode, unspecified: Secondary | ICD-10-CM | POA: Diagnosis not present

## 2022-04-08 DIAGNOSIS — G3183 Dementia with Lewy bodies: Secondary | ICD-10-CM | POA: Diagnosis not present

## 2022-04-10 DIAGNOSIS — Z79899 Other long term (current) drug therapy: Secondary | ICD-10-CM | POA: Diagnosis not present

## 2022-04-10 DIAGNOSIS — E559 Vitamin D deficiency, unspecified: Secondary | ICD-10-CM | POA: Diagnosis not present

## 2022-04-23 DIAGNOSIS — F419 Anxiety disorder, unspecified: Secondary | ICD-10-CM | POA: Diagnosis not present

## 2022-04-23 DIAGNOSIS — F32A Depression, unspecified: Secondary | ICD-10-CM | POA: Diagnosis not present

## 2022-05-02 DIAGNOSIS — I10 Essential (primary) hypertension: Secondary | ICD-10-CM | POA: Diagnosis not present

## 2022-05-05 DIAGNOSIS — E119 Type 2 diabetes mellitus without complications: Secondary | ICD-10-CM | POA: Diagnosis not present

## 2022-05-05 DIAGNOSIS — E08311 Diabetes mellitus due to underlying condition with unspecified diabetic retinopathy with macular edema: Secondary | ICD-10-CM | POA: Diagnosis not present

## 2022-05-05 DIAGNOSIS — Z1389 Encounter for screening for other disorder: Secondary | ICD-10-CM | POA: Diagnosis not present

## 2022-05-07 DIAGNOSIS — I129 Hypertensive chronic kidney disease with stage 1 through stage 4 chronic kidney disease, or unspecified chronic kidney disease: Secondary | ICD-10-CM | POA: Diagnosis not present

## 2022-05-07 DIAGNOSIS — N1832 Chronic kidney disease, stage 3b: Secondary | ICD-10-CM | POA: Diagnosis not present

## 2022-05-07 DIAGNOSIS — E1122 Type 2 diabetes mellitus with diabetic chronic kidney disease: Secondary | ICD-10-CM | POA: Diagnosis not present

## 2022-05-07 DIAGNOSIS — Z794 Long term (current) use of insulin: Secondary | ICD-10-CM | POA: Diagnosis not present

## 2022-05-13 DIAGNOSIS — I509 Heart failure, unspecified: Secondary | ICD-10-CM | POA: Diagnosis not present

## 2022-05-13 DIAGNOSIS — E785 Hyperlipidemia, unspecified: Secondary | ICD-10-CM | POA: Diagnosis not present

## 2022-05-13 DIAGNOSIS — F0390 Unspecified dementia without behavioral disturbance: Secondary | ICD-10-CM | POA: Diagnosis not present

## 2022-05-13 DIAGNOSIS — E1122 Type 2 diabetes mellitus with diabetic chronic kidney disease: Secondary | ICD-10-CM | POA: Diagnosis not present

## 2022-05-13 DIAGNOSIS — E1165 Type 2 diabetes mellitus with hyperglycemia: Secondary | ICD-10-CM | POA: Diagnosis not present

## 2022-05-13 DIAGNOSIS — I130 Hypertensive heart and chronic kidney disease with heart failure and stage 1 through stage 4 chronic kidney disease, or unspecified chronic kidney disease: Secondary | ICD-10-CM | POA: Diagnosis not present

## 2022-05-13 DIAGNOSIS — N189 Chronic kidney disease, unspecified: Secondary | ICD-10-CM | POA: Diagnosis not present

## 2022-05-13 DIAGNOSIS — Z794 Long term (current) use of insulin: Secondary | ICD-10-CM | POA: Diagnosis not present

## 2022-05-13 DIAGNOSIS — K219 Gastro-esophageal reflux disease without esophagitis: Secondary | ICD-10-CM | POA: Diagnosis not present

## 2022-05-20 DIAGNOSIS — E1122 Type 2 diabetes mellitus with diabetic chronic kidney disease: Secondary | ICD-10-CM | POA: Diagnosis not present

## 2022-05-20 DIAGNOSIS — K219 Gastro-esophageal reflux disease without esophagitis: Secondary | ICD-10-CM | POA: Diagnosis not present

## 2022-05-20 DIAGNOSIS — N189 Chronic kidney disease, unspecified: Secondary | ICD-10-CM | POA: Diagnosis not present

## 2022-05-20 DIAGNOSIS — I129 Hypertensive chronic kidney disease with stage 1 through stage 4 chronic kidney disease, or unspecified chronic kidney disease: Secondary | ICD-10-CM | POA: Diagnosis not present

## 2022-05-20 DIAGNOSIS — Z794 Long term (current) use of insulin: Secondary | ICD-10-CM | POA: Diagnosis not present

## 2022-05-28 DIAGNOSIS — E1122 Type 2 diabetes mellitus with diabetic chronic kidney disease: Secondary | ICD-10-CM | POA: Diagnosis not present

## 2022-05-28 DIAGNOSIS — Z794 Long term (current) use of insulin: Secondary | ICD-10-CM | POA: Diagnosis not present

## 2022-05-28 DIAGNOSIS — N189 Chronic kidney disease, unspecified: Secondary | ICD-10-CM | POA: Diagnosis not present

## 2022-06-04 DIAGNOSIS — F0390 Unspecified dementia without behavioral disturbance: Secondary | ICD-10-CM | POA: Diagnosis not present

## 2022-06-04 DIAGNOSIS — F29 Unspecified psychosis not due to a substance or known physiological condition: Secondary | ICD-10-CM | POA: Diagnosis not present

## 2022-06-04 DIAGNOSIS — R270 Ataxia, unspecified: Secondary | ICD-10-CM | POA: Diagnosis not present

## 2022-06-04 DIAGNOSIS — E119 Type 2 diabetes mellitus without complications: Secondary | ICD-10-CM | POA: Diagnosis not present

## 2022-06-04 DIAGNOSIS — Z794 Long term (current) use of insulin: Secondary | ICD-10-CM | POA: Diagnosis not present

## 2022-06-18 DIAGNOSIS — F0283 Dementia in other diseases classified elsewhere, unspecified severity, with mood disturbance: Secondary | ICD-10-CM | POA: Diagnosis not present

## 2022-06-18 DIAGNOSIS — R350 Frequency of micturition: Secondary | ICD-10-CM | POA: Diagnosis not present

## 2022-06-18 DIAGNOSIS — F0284 Dementia in other diseases classified elsewhere, unspecified severity, with anxiety: Secondary | ICD-10-CM | POA: Diagnosis not present

## 2022-06-18 DIAGNOSIS — R300 Dysuria: Secondary | ICD-10-CM | POA: Diagnosis not present

## 2022-06-18 DIAGNOSIS — F32A Depression, unspecified: Secondary | ICD-10-CM | POA: Diagnosis not present

## 2022-06-18 DIAGNOSIS — F0282 Dementia in other diseases classified elsewhere, unspecified severity, with psychotic disturbance: Secondary | ICD-10-CM | POA: Diagnosis not present

## 2022-06-18 DIAGNOSIS — F419 Anxiety disorder, unspecified: Secondary | ICD-10-CM | POA: Diagnosis not present

## 2022-06-18 DIAGNOSIS — G3183 Dementia with Lewy bodies: Secondary | ICD-10-CM | POA: Diagnosis not present

## 2022-06-24 DIAGNOSIS — R300 Dysuria: Secondary | ICD-10-CM | POA: Diagnosis not present

## 2022-07-01 DIAGNOSIS — E119 Type 2 diabetes mellitus without complications: Secondary | ICD-10-CM | POA: Diagnosis not present

## 2022-07-01 DIAGNOSIS — G8929 Other chronic pain: Secondary | ICD-10-CM | POA: Diagnosis not present

## 2022-07-01 DIAGNOSIS — I10 Essential (primary) hypertension: Secondary | ICD-10-CM | POA: Diagnosis not present

## 2022-07-07 DIAGNOSIS — B351 Tinea unguium: Secondary | ICD-10-CM | POA: Diagnosis not present

## 2022-07-07 DIAGNOSIS — E1151 Type 2 diabetes mellitus with diabetic peripheral angiopathy without gangrene: Secondary | ICD-10-CM | POA: Diagnosis not present

## 2022-07-07 DIAGNOSIS — Z89511 Acquired absence of right leg below knee: Secondary | ICD-10-CM | POA: Diagnosis not present

## 2022-07-07 DIAGNOSIS — Z794 Long term (current) use of insulin: Secondary | ICD-10-CM | POA: Diagnosis not present

## 2022-07-15 DIAGNOSIS — I130 Hypertensive heart and chronic kidney disease with heart failure and stage 1 through stage 4 chronic kidney disease, or unspecified chronic kidney disease: Secondary | ICD-10-CM | POA: Diagnosis not present

## 2022-07-15 DIAGNOSIS — F0392 Unspecified dementia, unspecified severity, with psychotic disturbance: Secondary | ICD-10-CM | POA: Diagnosis not present

## 2022-07-15 DIAGNOSIS — E785 Hyperlipidemia, unspecified: Secondary | ICD-10-CM | POA: Diagnosis not present

## 2022-07-15 DIAGNOSIS — Z794 Long term (current) use of insulin: Secondary | ICD-10-CM | POA: Diagnosis not present

## 2022-07-15 DIAGNOSIS — K219 Gastro-esophageal reflux disease without esophagitis: Secondary | ICD-10-CM | POA: Diagnosis not present

## 2022-07-15 DIAGNOSIS — E1122 Type 2 diabetes mellitus with diabetic chronic kidney disease: Secondary | ICD-10-CM | POA: Diagnosis not present

## 2022-07-15 DIAGNOSIS — I509 Heart failure, unspecified: Secondary | ICD-10-CM | POA: Diagnosis not present

## 2022-07-15 DIAGNOSIS — N1832 Chronic kidney disease, stage 3b: Secondary | ICD-10-CM | POA: Diagnosis not present

## 2022-07-21 DIAGNOSIS — L03116 Cellulitis of left lower limb: Secondary | ICD-10-CM | POA: Diagnosis not present

## 2022-08-04 DIAGNOSIS — Z79899 Other long term (current) drug therapy: Secondary | ICD-10-CM | POA: Diagnosis not present

## 2022-08-06 DIAGNOSIS — F0390 Unspecified dementia without behavioral disturbance: Secondary | ICD-10-CM | POA: Diagnosis not present

## 2022-08-06 DIAGNOSIS — I509 Heart failure, unspecified: Secondary | ICD-10-CM | POA: Diagnosis not present

## 2022-08-06 DIAGNOSIS — E785 Hyperlipidemia, unspecified: Secondary | ICD-10-CM | POA: Diagnosis not present

## 2022-08-08 DIAGNOSIS — R278 Other lack of coordination: Secondary | ICD-10-CM | POA: Diagnosis not present

## 2022-08-08 DIAGNOSIS — M6281 Muscle weakness (generalized): Secondary | ICD-10-CM | POA: Diagnosis not present

## 2022-08-11 DIAGNOSIS — M6281 Muscle weakness (generalized): Secondary | ICD-10-CM | POA: Diagnosis not present

## 2022-08-11 DIAGNOSIS — R278 Other lack of coordination: Secondary | ICD-10-CM | POA: Diagnosis not present

## 2022-08-12 DIAGNOSIS — R278 Other lack of coordination: Secondary | ICD-10-CM | POA: Diagnosis not present

## 2022-08-12 DIAGNOSIS — Z794 Long term (current) use of insulin: Secondary | ICD-10-CM | POA: Diagnosis not present

## 2022-08-12 DIAGNOSIS — M6281 Muscle weakness (generalized): Secondary | ICD-10-CM | POA: Diagnosis not present

## 2022-08-12 DIAGNOSIS — E1165 Type 2 diabetes mellitus with hyperglycemia: Secondary | ICD-10-CM | POA: Diagnosis not present

## 2022-08-13 DIAGNOSIS — R278 Other lack of coordination: Secondary | ICD-10-CM | POA: Diagnosis not present

## 2022-08-13 DIAGNOSIS — M6281 Muscle weakness (generalized): Secondary | ICD-10-CM | POA: Diagnosis not present

## 2022-08-15 DIAGNOSIS — R278 Other lack of coordination: Secondary | ICD-10-CM | POA: Diagnosis not present

## 2022-08-15 DIAGNOSIS — M6281 Muscle weakness (generalized): Secondary | ICD-10-CM | POA: Diagnosis not present

## 2022-08-18 DIAGNOSIS — M6281 Muscle weakness (generalized): Secondary | ICD-10-CM | POA: Diagnosis not present

## 2022-08-18 DIAGNOSIS — R278 Other lack of coordination: Secondary | ICD-10-CM | POA: Diagnosis not present

## 2022-08-19 DIAGNOSIS — R278 Other lack of coordination: Secondary | ICD-10-CM | POA: Diagnosis not present

## 2022-08-19 DIAGNOSIS — M6281 Muscle weakness (generalized): Secondary | ICD-10-CM | POA: Diagnosis not present

## 2022-08-20 DIAGNOSIS — R0989 Other specified symptoms and signs involving the circulatory and respiratory systems: Secondary | ICD-10-CM | POA: Diagnosis not present

## 2022-08-20 DIAGNOSIS — Z79899 Other long term (current) drug therapy: Secondary | ICD-10-CM | POA: Diagnosis not present

## 2022-08-20 DIAGNOSIS — R058 Other specified cough: Secondary | ICD-10-CM | POA: Diagnosis not present

## 2022-08-20 DIAGNOSIS — M6281 Muscle weakness (generalized): Secondary | ICD-10-CM | POA: Diagnosis not present

## 2022-08-20 DIAGNOSIS — Z794 Long term (current) use of insulin: Secondary | ICD-10-CM | POA: Diagnosis not present

## 2022-08-20 DIAGNOSIS — E119 Type 2 diabetes mellitus without complications: Secondary | ICD-10-CM | POA: Diagnosis not present

## 2022-08-20 DIAGNOSIS — J849 Interstitial pulmonary disease, unspecified: Secondary | ICD-10-CM | POA: Diagnosis not present

## 2022-08-20 DIAGNOSIS — R278 Other lack of coordination: Secondary | ICD-10-CM | POA: Diagnosis not present

## 2022-08-21 DIAGNOSIS — Z79899 Other long term (current) drug therapy: Secondary | ICD-10-CM | POA: Diagnosis not present

## 2022-08-21 DIAGNOSIS — R278 Other lack of coordination: Secondary | ICD-10-CM | POA: Diagnosis not present

## 2022-08-21 DIAGNOSIS — M6281 Muscle weakness (generalized): Secondary | ICD-10-CM | POA: Diagnosis not present

## 2022-08-22 DIAGNOSIS — R278 Other lack of coordination: Secondary | ICD-10-CM | POA: Diagnosis not present

## 2022-08-22 DIAGNOSIS — M6281 Muscle weakness (generalized): Secondary | ICD-10-CM | POA: Diagnosis not present

## 2022-08-22 DIAGNOSIS — J209 Acute bronchitis, unspecified: Secondary | ICD-10-CM | POA: Diagnosis not present

## 2022-08-25 DIAGNOSIS — R278 Other lack of coordination: Secondary | ICD-10-CM | POA: Diagnosis not present

## 2022-08-25 DIAGNOSIS — M6281 Muscle weakness (generalized): Secondary | ICD-10-CM | POA: Diagnosis not present

## 2022-08-26 DIAGNOSIS — R278 Other lack of coordination: Secondary | ICD-10-CM | POA: Diagnosis not present

## 2022-08-26 DIAGNOSIS — M6281 Muscle weakness (generalized): Secondary | ICD-10-CM | POA: Diagnosis not present

## 2022-08-27 DIAGNOSIS — M6281 Muscle weakness (generalized): Secondary | ICD-10-CM | POA: Diagnosis not present

## 2022-08-27 DIAGNOSIS — R278 Other lack of coordination: Secondary | ICD-10-CM | POA: Diagnosis not present

## 2022-08-28 DIAGNOSIS — R278 Other lack of coordination: Secondary | ICD-10-CM | POA: Diagnosis not present

## 2022-08-28 DIAGNOSIS — M6281 Muscle weakness (generalized): Secondary | ICD-10-CM | POA: Diagnosis not present

## 2022-08-29 DIAGNOSIS — M6281 Muscle weakness (generalized): Secondary | ICD-10-CM | POA: Diagnosis not present

## 2022-08-29 DIAGNOSIS — R278 Other lack of coordination: Secondary | ICD-10-CM | POA: Diagnosis not present

## 2022-09-03 DIAGNOSIS — R278 Other lack of coordination: Secondary | ICD-10-CM | POA: Diagnosis not present

## 2022-09-03 DIAGNOSIS — I509 Heart failure, unspecified: Secondary | ICD-10-CM | POA: Diagnosis not present

## 2022-09-03 DIAGNOSIS — F0392 Unspecified dementia, unspecified severity, with psychotic disturbance: Secondary | ICD-10-CM | POA: Diagnosis not present

## 2022-09-03 DIAGNOSIS — E785 Hyperlipidemia, unspecified: Secondary | ICD-10-CM | POA: Diagnosis not present

## 2022-09-03 DIAGNOSIS — Z794 Long term (current) use of insulin: Secondary | ICD-10-CM | POA: Diagnosis not present

## 2022-09-03 DIAGNOSIS — N1832 Chronic kidney disease, stage 3b: Secondary | ICD-10-CM | POA: Diagnosis not present

## 2022-09-03 DIAGNOSIS — I130 Hypertensive heart and chronic kidney disease with heart failure and stage 1 through stage 4 chronic kidney disease, or unspecified chronic kidney disease: Secondary | ICD-10-CM | POA: Diagnosis not present

## 2022-09-03 DIAGNOSIS — F0393 Unspecified dementia, unspecified severity, with mood disturbance: Secondary | ICD-10-CM | POA: Diagnosis not present

## 2022-09-03 DIAGNOSIS — M6281 Muscle weakness (generalized): Secondary | ICD-10-CM | POA: Diagnosis not present

## 2022-09-03 DIAGNOSIS — E1122 Type 2 diabetes mellitus with diabetic chronic kidney disease: Secondary | ICD-10-CM | POA: Diagnosis not present

## 2022-09-03 DIAGNOSIS — F0394 Unspecified dementia, unspecified severity, with anxiety: Secondary | ICD-10-CM | POA: Diagnosis not present

## 2022-09-04 DIAGNOSIS — R278 Other lack of coordination: Secondary | ICD-10-CM | POA: Diagnosis not present

## 2022-09-04 DIAGNOSIS — M6281 Muscle weakness (generalized): Secondary | ICD-10-CM | POA: Diagnosis not present

## 2022-09-04 DIAGNOSIS — R5382 Chronic fatigue, unspecified: Secondary | ICD-10-CM | POA: Diagnosis not present

## 2022-09-05 DIAGNOSIS — M6281 Muscle weakness (generalized): Secondary | ICD-10-CM | POA: Diagnosis not present

## 2022-09-05 DIAGNOSIS — R278 Other lack of coordination: Secondary | ICD-10-CM | POA: Diagnosis not present

## 2022-09-26 DIAGNOSIS — Z8619 Personal history of other infectious and parasitic diseases: Secondary | ICD-10-CM | POA: Diagnosis not present

## 2022-09-26 DIAGNOSIS — J300 Vasomotor rhinitis: Secondary | ICD-10-CM | POA: Diagnosis not present

## 2022-09-26 DIAGNOSIS — N390 Urinary tract infection, site not specified: Secondary | ICD-10-CM | POA: Diagnosis not present

## 2022-09-26 DIAGNOSIS — R5383 Other fatigue: Secondary | ICD-10-CM | POA: Diagnosis not present

## 2022-09-26 DIAGNOSIS — R0989 Other specified symptoms and signs involving the circulatory and respiratory systems: Secondary | ICD-10-CM | POA: Diagnosis not present

## 2022-09-26 DIAGNOSIS — R630 Anorexia: Secondary | ICD-10-CM | POA: Diagnosis not present

## 2022-09-26 DIAGNOSIS — N1831 Chronic kidney disease, stage 3a: Secondary | ICD-10-CM | POA: Diagnosis not present

## 2022-09-26 DIAGNOSIS — R051 Acute cough: Secondary | ICD-10-CM | POA: Diagnosis not present

## 2022-09-26 DIAGNOSIS — J189 Pneumonia, unspecified organism: Secondary | ICD-10-CM | POA: Diagnosis not present

## 2022-09-26 DIAGNOSIS — R918 Other nonspecific abnormal finding of lung field: Secondary | ICD-10-CM | POA: Diagnosis not present

## 2022-09-29 DIAGNOSIS — J189 Pneumonia, unspecified organism: Secondary | ICD-10-CM | POA: Diagnosis not present

## 2022-10-08 DIAGNOSIS — F0390 Unspecified dementia without behavioral disturbance: Secondary | ICD-10-CM | POA: Diagnosis not present

## 2022-10-08 DIAGNOSIS — E119 Type 2 diabetes mellitus without complications: Secondary | ICD-10-CM | POA: Diagnosis not present

## 2022-10-08 DIAGNOSIS — J300 Vasomotor rhinitis: Secondary | ICD-10-CM | POA: Diagnosis not present

## 2022-10-08 DIAGNOSIS — Z794 Long term (current) use of insulin: Secondary | ICD-10-CM | POA: Diagnosis not present

## 2022-10-08 DIAGNOSIS — R270 Ataxia, unspecified: Secondary | ICD-10-CM | POA: Diagnosis not present

## 2022-10-27 DIAGNOSIS — G3183 Dementia with Lewy bodies: Secondary | ICD-10-CM | POA: Diagnosis not present

## 2022-10-27 DIAGNOSIS — F0284 Dementia in other diseases classified elsewhere, unspecified severity, with anxiety: Secondary | ICD-10-CM | POA: Diagnosis not present

## 2022-10-27 DIAGNOSIS — F0282 Dementia in other diseases classified elsewhere, unspecified severity, with psychotic disturbance: Secondary | ICD-10-CM | POA: Diagnosis not present

## 2022-10-27 DIAGNOSIS — F419 Anxiety disorder, unspecified: Secondary | ICD-10-CM | POA: Diagnosis not present

## 2022-11-03 DIAGNOSIS — R278 Other lack of coordination: Secondary | ICD-10-CM | POA: Diagnosis not present

## 2022-11-03 DIAGNOSIS — M6281 Muscle weakness (generalized): Secondary | ICD-10-CM | POA: Diagnosis not present

## 2022-11-03 DIAGNOSIS — Z741 Need for assistance with personal care: Secondary | ICD-10-CM | POA: Diagnosis not present

## 2022-11-03 DIAGNOSIS — R2681 Unsteadiness on feet: Secondary | ICD-10-CM | POA: Diagnosis not present

## 2022-11-04 DIAGNOSIS — R278 Other lack of coordination: Secondary | ICD-10-CM | POA: Diagnosis not present

## 2022-11-04 DIAGNOSIS — R2681 Unsteadiness on feet: Secondary | ICD-10-CM | POA: Diagnosis not present

## 2022-11-04 DIAGNOSIS — M6281 Muscle weakness (generalized): Secondary | ICD-10-CM | POA: Diagnosis not present

## 2022-11-04 DIAGNOSIS — Z741 Need for assistance with personal care: Secondary | ICD-10-CM | POA: Diagnosis not present

## 2022-11-05 DIAGNOSIS — R278 Other lack of coordination: Secondary | ICD-10-CM | POA: Diagnosis not present

## 2022-11-05 DIAGNOSIS — I130 Hypertensive heart and chronic kidney disease with heart failure and stage 1 through stage 4 chronic kidney disease, or unspecified chronic kidney disease: Secondary | ICD-10-CM | POA: Diagnosis not present

## 2022-11-05 DIAGNOSIS — E785 Hyperlipidemia, unspecified: Secondary | ICD-10-CM | POA: Diagnosis not present

## 2022-11-05 DIAGNOSIS — E538 Deficiency of other specified B group vitamins: Secondary | ICD-10-CM | POA: Diagnosis not present

## 2022-11-05 DIAGNOSIS — M6281 Muscle weakness (generalized): Secondary | ICD-10-CM | POA: Diagnosis not present

## 2022-11-05 DIAGNOSIS — R2681 Unsteadiness on feet: Secondary | ICD-10-CM | POA: Diagnosis not present

## 2022-11-05 DIAGNOSIS — L299 Pruritus, unspecified: Secondary | ICD-10-CM | POA: Diagnosis not present

## 2022-11-05 DIAGNOSIS — G8929 Other chronic pain: Secondary | ICD-10-CM | POA: Diagnosis not present

## 2022-11-05 DIAGNOSIS — N1832 Chronic kidney disease, stage 3b: Secondary | ICD-10-CM | POA: Diagnosis not present

## 2022-11-05 DIAGNOSIS — I509 Heart failure, unspecified: Secondary | ICD-10-CM | POA: Diagnosis not present

## 2022-11-05 DIAGNOSIS — Z741 Need for assistance with personal care: Secondary | ICD-10-CM | POA: Diagnosis not present

## 2022-11-05 DIAGNOSIS — K219 Gastro-esophageal reflux disease without esophagitis: Secondary | ICD-10-CM | POA: Diagnosis not present

## 2022-11-05 DIAGNOSIS — E1122 Type 2 diabetes mellitus with diabetic chronic kidney disease: Secondary | ICD-10-CM | POA: Diagnosis not present

## 2022-11-06 DIAGNOSIS — R2681 Unsteadiness on feet: Secondary | ICD-10-CM | POA: Diagnosis not present

## 2022-11-06 DIAGNOSIS — Z741 Need for assistance with personal care: Secondary | ICD-10-CM | POA: Diagnosis not present

## 2022-11-06 DIAGNOSIS — M6281 Muscle weakness (generalized): Secondary | ICD-10-CM | POA: Diagnosis not present

## 2022-11-06 DIAGNOSIS — R278 Other lack of coordination: Secondary | ICD-10-CM | POA: Diagnosis not present

## 2022-11-07 DIAGNOSIS — R2681 Unsteadiness on feet: Secondary | ICD-10-CM | POA: Diagnosis not present

## 2022-11-07 DIAGNOSIS — R278 Other lack of coordination: Secondary | ICD-10-CM | POA: Diagnosis not present

## 2022-11-07 DIAGNOSIS — Z741 Need for assistance with personal care: Secondary | ICD-10-CM | POA: Diagnosis not present

## 2022-11-07 DIAGNOSIS — M6281 Muscle weakness (generalized): Secondary | ICD-10-CM | POA: Diagnosis not present

## 2022-11-10 DIAGNOSIS — Z741 Need for assistance with personal care: Secondary | ICD-10-CM | POA: Diagnosis not present

## 2022-11-10 DIAGNOSIS — R2681 Unsteadiness on feet: Secondary | ICD-10-CM | POA: Diagnosis not present

## 2022-11-10 DIAGNOSIS — M6281 Muscle weakness (generalized): Secondary | ICD-10-CM | POA: Diagnosis not present

## 2022-11-10 DIAGNOSIS — R278 Other lack of coordination: Secondary | ICD-10-CM | POA: Diagnosis not present

## 2022-11-11 DIAGNOSIS — R278 Other lack of coordination: Secondary | ICD-10-CM | POA: Diagnosis not present

## 2022-11-11 DIAGNOSIS — M6281 Muscle weakness (generalized): Secondary | ICD-10-CM | POA: Diagnosis not present

## 2022-11-11 DIAGNOSIS — Z741 Need for assistance with personal care: Secondary | ICD-10-CM | POA: Diagnosis not present

## 2022-11-11 DIAGNOSIS — R2681 Unsteadiness on feet: Secondary | ICD-10-CM | POA: Diagnosis not present

## 2022-11-12 DIAGNOSIS — Z741 Need for assistance with personal care: Secondary | ICD-10-CM | POA: Diagnosis not present

## 2022-11-12 DIAGNOSIS — R278 Other lack of coordination: Secondary | ICD-10-CM | POA: Diagnosis not present

## 2022-11-12 DIAGNOSIS — M6281 Muscle weakness (generalized): Secondary | ICD-10-CM | POA: Diagnosis not present

## 2022-11-12 DIAGNOSIS — R2681 Unsteadiness on feet: Secondary | ICD-10-CM | POA: Diagnosis not present

## 2022-11-13 DIAGNOSIS — Z741 Need for assistance with personal care: Secondary | ICD-10-CM | POA: Diagnosis not present

## 2022-11-13 DIAGNOSIS — R2681 Unsteadiness on feet: Secondary | ICD-10-CM | POA: Diagnosis not present

## 2022-11-13 DIAGNOSIS — R278 Other lack of coordination: Secondary | ICD-10-CM | POA: Diagnosis not present

## 2022-11-13 DIAGNOSIS — M6281 Muscle weakness (generalized): Secondary | ICD-10-CM | POA: Diagnosis not present

## 2022-11-14 DIAGNOSIS — M6281 Muscle weakness (generalized): Secondary | ICD-10-CM | POA: Diagnosis not present

## 2022-11-14 DIAGNOSIS — R2681 Unsteadiness on feet: Secondary | ICD-10-CM | POA: Diagnosis not present

## 2022-11-14 DIAGNOSIS — R278 Other lack of coordination: Secondary | ICD-10-CM | POA: Diagnosis not present

## 2022-11-14 DIAGNOSIS — Z741 Need for assistance with personal care: Secondary | ICD-10-CM | POA: Diagnosis not present

## 2022-11-17 DIAGNOSIS — Z741 Need for assistance with personal care: Secondary | ICD-10-CM | POA: Diagnosis not present

## 2022-11-17 DIAGNOSIS — R278 Other lack of coordination: Secondary | ICD-10-CM | POA: Diagnosis not present

## 2022-11-17 DIAGNOSIS — R2681 Unsteadiness on feet: Secondary | ICD-10-CM | POA: Diagnosis not present

## 2022-11-17 DIAGNOSIS — M6281 Muscle weakness (generalized): Secondary | ICD-10-CM | POA: Diagnosis not present

## 2022-11-18 DIAGNOSIS — Z741 Need for assistance with personal care: Secondary | ICD-10-CM | POA: Diagnosis not present

## 2022-11-18 DIAGNOSIS — M6281 Muscle weakness (generalized): Secondary | ICD-10-CM | POA: Diagnosis not present

## 2022-11-18 DIAGNOSIS — R278 Other lack of coordination: Secondary | ICD-10-CM | POA: Diagnosis not present

## 2022-11-18 DIAGNOSIS — R2681 Unsteadiness on feet: Secondary | ICD-10-CM | POA: Diagnosis not present

## 2022-11-19 DIAGNOSIS — Z79899 Other long term (current) drug therapy: Secondary | ICD-10-CM | POA: Diagnosis not present

## 2022-11-19 DIAGNOSIS — Z741 Need for assistance with personal care: Secondary | ICD-10-CM | POA: Diagnosis not present

## 2022-11-19 DIAGNOSIS — M6281 Muscle weakness (generalized): Secondary | ICD-10-CM | POA: Diagnosis not present

## 2022-11-19 DIAGNOSIS — R278 Other lack of coordination: Secondary | ICD-10-CM | POA: Diagnosis not present

## 2022-11-19 DIAGNOSIS — R2681 Unsteadiness on feet: Secondary | ICD-10-CM | POA: Diagnosis not present

## 2022-11-20 DIAGNOSIS — R2681 Unsteadiness on feet: Secondary | ICD-10-CM | POA: Diagnosis not present

## 2022-11-20 DIAGNOSIS — R278 Other lack of coordination: Secondary | ICD-10-CM | POA: Diagnosis not present

## 2022-11-20 DIAGNOSIS — Z741 Need for assistance with personal care: Secondary | ICD-10-CM | POA: Diagnosis not present

## 2022-11-20 DIAGNOSIS — M6281 Muscle weakness (generalized): Secondary | ICD-10-CM | POA: Diagnosis not present

## 2022-11-21 DIAGNOSIS — F0284 Dementia in other diseases classified elsewhere, unspecified severity, with anxiety: Secondary | ICD-10-CM | POA: Diagnosis not present

## 2022-11-21 DIAGNOSIS — M6281 Muscle weakness (generalized): Secondary | ICD-10-CM | POA: Diagnosis not present

## 2022-11-21 DIAGNOSIS — Z741 Need for assistance with personal care: Secondary | ICD-10-CM | POA: Diagnosis not present

## 2022-11-21 DIAGNOSIS — R2681 Unsteadiness on feet: Secondary | ICD-10-CM | POA: Diagnosis not present

## 2022-11-21 DIAGNOSIS — F32A Depression, unspecified: Secondary | ICD-10-CM | POA: Diagnosis not present

## 2022-11-21 DIAGNOSIS — F419 Anxiety disorder, unspecified: Secondary | ICD-10-CM | POA: Diagnosis not present

## 2022-11-21 DIAGNOSIS — R296 Repeated falls: Secondary | ICD-10-CM | POA: Diagnosis not present

## 2022-11-21 DIAGNOSIS — F0283 Dementia in other diseases classified elsewhere, unspecified severity, with mood disturbance: Secondary | ICD-10-CM | POA: Diagnosis not present

## 2022-11-21 DIAGNOSIS — K219 Gastro-esophageal reflux disease without esophagitis: Secondary | ICD-10-CM | POA: Diagnosis not present

## 2022-11-21 DIAGNOSIS — R278 Other lack of coordination: Secondary | ICD-10-CM | POA: Diagnosis not present

## 2022-11-21 DIAGNOSIS — E1122 Type 2 diabetes mellitus with diabetic chronic kidney disease: Secondary | ICD-10-CM | POA: Diagnosis not present

## 2022-11-21 DIAGNOSIS — G3183 Dementia with Lewy bodies: Secondary | ICD-10-CM | POA: Diagnosis not present

## 2022-11-21 DIAGNOSIS — F0282 Dementia in other diseases classified elsewhere, unspecified severity, with psychotic disturbance: Secondary | ICD-10-CM | POA: Diagnosis not present

## 2022-11-24 DIAGNOSIS — M6281 Muscle weakness (generalized): Secondary | ICD-10-CM | POA: Diagnosis not present

## 2022-11-24 DIAGNOSIS — R278 Other lack of coordination: Secondary | ICD-10-CM | POA: Diagnosis not present

## 2022-11-24 DIAGNOSIS — R2681 Unsteadiness on feet: Secondary | ICD-10-CM | POA: Diagnosis not present

## 2022-11-24 DIAGNOSIS — Z741 Need for assistance with personal care: Secondary | ICD-10-CM | POA: Diagnosis not present

## 2022-11-25 DIAGNOSIS — R2681 Unsteadiness on feet: Secondary | ICD-10-CM | POA: Diagnosis not present

## 2022-11-25 DIAGNOSIS — R278 Other lack of coordination: Secondary | ICD-10-CM | POA: Diagnosis not present

## 2022-11-25 DIAGNOSIS — M6281 Muscle weakness (generalized): Secondary | ICD-10-CM | POA: Diagnosis not present

## 2022-11-25 DIAGNOSIS — Z741 Need for assistance with personal care: Secondary | ICD-10-CM | POA: Diagnosis not present

## 2022-11-26 DIAGNOSIS — Z741 Need for assistance with personal care: Secondary | ICD-10-CM | POA: Diagnosis not present

## 2022-11-26 DIAGNOSIS — R2681 Unsteadiness on feet: Secondary | ICD-10-CM | POA: Diagnosis not present

## 2022-11-26 DIAGNOSIS — R278 Other lack of coordination: Secondary | ICD-10-CM | POA: Diagnosis not present

## 2022-11-26 DIAGNOSIS — M6281 Muscle weakness (generalized): Secondary | ICD-10-CM | POA: Diagnosis not present

## 2022-11-27 DIAGNOSIS — Z741 Need for assistance with personal care: Secondary | ICD-10-CM | POA: Diagnosis not present

## 2022-11-27 DIAGNOSIS — M6281 Muscle weakness (generalized): Secondary | ICD-10-CM | POA: Diagnosis not present

## 2022-11-27 DIAGNOSIS — R278 Other lack of coordination: Secondary | ICD-10-CM | POA: Diagnosis not present

## 2022-11-27 DIAGNOSIS — R2681 Unsteadiness on feet: Secondary | ICD-10-CM | POA: Diagnosis not present

## 2022-11-27 DIAGNOSIS — S80821A Blister (nonthermal), right lower leg, initial encounter: Secondary | ICD-10-CM | POA: Diagnosis not present

## 2022-11-28 DIAGNOSIS — M6281 Muscle weakness (generalized): Secondary | ICD-10-CM | POA: Diagnosis not present

## 2022-11-28 DIAGNOSIS — Z741 Need for assistance with personal care: Secondary | ICD-10-CM | POA: Diagnosis not present

## 2022-11-28 DIAGNOSIS — R278 Other lack of coordination: Secondary | ICD-10-CM | POA: Diagnosis not present

## 2022-11-28 DIAGNOSIS — R2681 Unsteadiness on feet: Secondary | ICD-10-CM | POA: Diagnosis not present

## 2022-12-01 DIAGNOSIS — R278 Other lack of coordination: Secondary | ICD-10-CM | POA: Diagnosis not present

## 2022-12-01 DIAGNOSIS — M6281 Muscle weakness (generalized): Secondary | ICD-10-CM | POA: Diagnosis not present

## 2022-12-01 DIAGNOSIS — Z741 Need for assistance with personal care: Secondary | ICD-10-CM | POA: Diagnosis not present

## 2022-12-01 DIAGNOSIS — R2681 Unsteadiness on feet: Secondary | ICD-10-CM | POA: Diagnosis not present

## 2022-12-02 DIAGNOSIS — F0283 Dementia in other diseases classified elsewhere, unspecified severity, with mood disturbance: Secondary | ICD-10-CM | POA: Diagnosis not present

## 2022-12-02 DIAGNOSIS — R2681 Unsteadiness on feet: Secondary | ICD-10-CM | POA: Diagnosis not present

## 2022-12-02 DIAGNOSIS — M6281 Muscle weakness (generalized): Secondary | ICD-10-CM | POA: Diagnosis not present

## 2022-12-02 DIAGNOSIS — F0282 Dementia in other diseases classified elsewhere, unspecified severity, with psychotic disturbance: Secondary | ICD-10-CM | POA: Diagnosis not present

## 2022-12-02 DIAGNOSIS — R278 Other lack of coordination: Secondary | ICD-10-CM | POA: Diagnosis not present

## 2022-12-02 DIAGNOSIS — G3183 Dementia with Lewy bodies: Secondary | ICD-10-CM | POA: Diagnosis not present

## 2022-12-02 DIAGNOSIS — F32A Depression, unspecified: Secondary | ICD-10-CM | POA: Diagnosis not present

## 2022-12-02 DIAGNOSIS — Z741 Need for assistance with personal care: Secondary | ICD-10-CM | POA: Diagnosis not present

## 2022-12-03 DIAGNOSIS — S50311A Abrasion of right elbow, initial encounter: Secondary | ICD-10-CM | POA: Diagnosis not present

## 2022-12-03 DIAGNOSIS — W19XXXA Unspecified fall, initial encounter: Secondary | ICD-10-CM | POA: Diagnosis not present

## 2022-12-03 DIAGNOSIS — M47812 Spondylosis without myelopathy or radiculopathy, cervical region: Secondary | ICD-10-CM | POA: Diagnosis not present

## 2022-12-03 DIAGNOSIS — R278 Other lack of coordination: Secondary | ICD-10-CM | POA: Diagnosis not present

## 2022-12-03 DIAGNOSIS — Z043 Encounter for examination and observation following other accident: Secondary | ICD-10-CM | POA: Diagnosis not present

## 2022-12-03 DIAGNOSIS — S0081XA Abrasion of other part of head, initial encounter: Secondary | ICD-10-CM | POA: Diagnosis not present

## 2022-12-03 DIAGNOSIS — Z993 Dependence on wheelchair: Secondary | ICD-10-CM | POA: Diagnosis not present

## 2022-12-03 DIAGNOSIS — R930 Abnormal findings on diagnostic imaging of skull and head, not elsewhere classified: Secondary | ICD-10-CM | POA: Diagnosis not present

## 2022-12-03 DIAGNOSIS — M25521 Pain in right elbow: Secondary | ICD-10-CM | POA: Diagnosis not present

## 2022-12-03 DIAGNOSIS — Z9889 Other specified postprocedural states: Secondary | ICD-10-CM | POA: Diagnosis not present

## 2022-12-03 DIAGNOSIS — R2681 Unsteadiness on feet: Secondary | ICD-10-CM | POA: Diagnosis not present

## 2022-12-03 DIAGNOSIS — R519 Headache, unspecified: Secondary | ICD-10-CM | POA: Diagnosis not present

## 2022-12-03 DIAGNOSIS — M6281 Muscle weakness (generalized): Secondary | ICD-10-CM | POA: Diagnosis not present

## 2022-12-03 DIAGNOSIS — Z741 Need for assistance with personal care: Secondary | ICD-10-CM | POA: Diagnosis not present

## 2022-12-04 DIAGNOSIS — R296 Repeated falls: Secondary | ICD-10-CM | POA: Diagnosis not present

## 2022-12-04 DIAGNOSIS — S0181XD Laceration without foreign body of other part of head, subsequent encounter: Secondary | ICD-10-CM | POA: Diagnosis not present

## 2022-12-05 DIAGNOSIS — R2681 Unsteadiness on feet: Secondary | ICD-10-CM | POA: Diagnosis not present

## 2022-12-05 DIAGNOSIS — Z741 Need for assistance with personal care: Secondary | ICD-10-CM | POA: Diagnosis not present

## 2022-12-05 DIAGNOSIS — G2581 Restless legs syndrome: Secondary | ICD-10-CM | POA: Diagnosis not present

## 2022-12-05 DIAGNOSIS — E1140 Type 2 diabetes mellitus with diabetic neuropathy, unspecified: Secondary | ICD-10-CM | POA: Diagnosis not present

## 2022-12-05 DIAGNOSIS — F0390 Unspecified dementia without behavioral disturbance: Secondary | ICD-10-CM | POA: Diagnosis not present

## 2022-12-05 DIAGNOSIS — M6281 Muscle weakness (generalized): Secondary | ICD-10-CM | POA: Diagnosis not present

## 2022-12-05 DIAGNOSIS — R278 Other lack of coordination: Secondary | ICD-10-CM | POA: Diagnosis not present

## 2022-12-06 DIAGNOSIS — R278 Other lack of coordination: Secondary | ICD-10-CM | POA: Diagnosis not present

## 2022-12-06 DIAGNOSIS — R2681 Unsteadiness on feet: Secondary | ICD-10-CM | POA: Diagnosis not present

## 2022-12-06 DIAGNOSIS — M6281 Muscle weakness (generalized): Secondary | ICD-10-CM | POA: Diagnosis not present

## 2022-12-06 DIAGNOSIS — Z741 Need for assistance with personal care: Secondary | ICD-10-CM | POA: Diagnosis not present

## 2022-12-07 DIAGNOSIS — R278 Other lack of coordination: Secondary | ICD-10-CM | POA: Diagnosis not present

## 2022-12-07 DIAGNOSIS — M6281 Muscle weakness (generalized): Secondary | ICD-10-CM | POA: Diagnosis not present

## 2022-12-07 DIAGNOSIS — R2681 Unsteadiness on feet: Secondary | ICD-10-CM | POA: Diagnosis not present

## 2022-12-07 DIAGNOSIS — Z741 Need for assistance with personal care: Secondary | ICD-10-CM | POA: Diagnosis not present

## 2022-12-08 DIAGNOSIS — R278 Other lack of coordination: Secondary | ICD-10-CM | POA: Diagnosis not present

## 2022-12-08 DIAGNOSIS — M6281 Muscle weakness (generalized): Secondary | ICD-10-CM | POA: Diagnosis not present

## 2022-12-08 DIAGNOSIS — R2681 Unsteadiness on feet: Secondary | ICD-10-CM | POA: Diagnosis not present

## 2022-12-08 DIAGNOSIS — Z741 Need for assistance with personal care: Secondary | ICD-10-CM | POA: Diagnosis not present

## 2022-12-09 DIAGNOSIS — Z741 Need for assistance with personal care: Secondary | ICD-10-CM | POA: Diagnosis not present

## 2022-12-09 DIAGNOSIS — R2681 Unsteadiness on feet: Secondary | ICD-10-CM | POA: Diagnosis not present

## 2022-12-09 DIAGNOSIS — M6281 Muscle weakness (generalized): Secondary | ICD-10-CM | POA: Diagnosis not present

## 2022-12-09 DIAGNOSIS — R278 Other lack of coordination: Secondary | ICD-10-CM | POA: Diagnosis not present

## 2022-12-10 DIAGNOSIS — R278 Other lack of coordination: Secondary | ICD-10-CM | POA: Diagnosis not present

## 2022-12-10 DIAGNOSIS — M6281 Muscle weakness (generalized): Secondary | ICD-10-CM | POA: Diagnosis not present

## 2022-12-10 DIAGNOSIS — Z741 Need for assistance with personal care: Secondary | ICD-10-CM | POA: Diagnosis not present

## 2022-12-10 DIAGNOSIS — R2681 Unsteadiness on feet: Secondary | ICD-10-CM | POA: Diagnosis not present

## 2022-12-11 DIAGNOSIS — R278 Other lack of coordination: Secondary | ICD-10-CM | POA: Diagnosis not present

## 2022-12-11 DIAGNOSIS — R2681 Unsteadiness on feet: Secondary | ICD-10-CM | POA: Diagnosis not present

## 2022-12-11 DIAGNOSIS — M6281 Muscle weakness (generalized): Secondary | ICD-10-CM | POA: Diagnosis not present

## 2022-12-11 DIAGNOSIS — Z741 Need for assistance with personal care: Secondary | ICD-10-CM | POA: Diagnosis not present

## 2022-12-12 DIAGNOSIS — R278 Other lack of coordination: Secondary | ICD-10-CM | POA: Diagnosis not present

## 2022-12-12 DIAGNOSIS — M6281 Muscle weakness (generalized): Secondary | ICD-10-CM | POA: Diagnosis not present

## 2022-12-12 DIAGNOSIS — Z741 Need for assistance with personal care: Secondary | ICD-10-CM | POA: Diagnosis not present

## 2022-12-12 DIAGNOSIS — R2681 Unsteadiness on feet: Secondary | ICD-10-CM | POA: Diagnosis not present

## 2022-12-13 DIAGNOSIS — M6281 Muscle weakness (generalized): Secondary | ICD-10-CM | POA: Diagnosis not present

## 2022-12-13 DIAGNOSIS — R2681 Unsteadiness on feet: Secondary | ICD-10-CM | POA: Diagnosis not present

## 2022-12-13 DIAGNOSIS — Z741 Need for assistance with personal care: Secondary | ICD-10-CM | POA: Diagnosis not present

## 2022-12-13 DIAGNOSIS — R278 Other lack of coordination: Secondary | ICD-10-CM | POA: Diagnosis not present

## 2022-12-15 DIAGNOSIS — Z741 Need for assistance with personal care: Secondary | ICD-10-CM | POA: Diagnosis not present

## 2022-12-15 DIAGNOSIS — R278 Other lack of coordination: Secondary | ICD-10-CM | POA: Diagnosis not present

## 2022-12-15 DIAGNOSIS — R2681 Unsteadiness on feet: Secondary | ICD-10-CM | POA: Diagnosis not present

## 2022-12-15 DIAGNOSIS — M6281 Muscle weakness (generalized): Secondary | ICD-10-CM | POA: Diagnosis not present

## 2022-12-23 DIAGNOSIS — F0393 Unspecified dementia, unspecified severity, with mood disturbance: Secondary | ICD-10-CM | POA: Diagnosis not present

## 2022-12-23 DIAGNOSIS — F419 Anxiety disorder, unspecified: Secondary | ICD-10-CM | POA: Diagnosis not present

## 2022-12-23 DIAGNOSIS — F0394 Unspecified dementia, unspecified severity, with anxiety: Secondary | ICD-10-CM | POA: Diagnosis not present

## 2023-01-19 DIAGNOSIS — J302 Other seasonal allergic rhinitis: Secondary | ICD-10-CM | POA: Diagnosis not present

## 2023-01-19 DIAGNOSIS — G894 Chronic pain syndrome: Secondary | ICD-10-CM | POA: Diagnosis not present

## 2023-01-23 DIAGNOSIS — R1312 Dysphagia, oropharyngeal phase: Secondary | ICD-10-CM | POA: Diagnosis not present

## 2023-01-24 DIAGNOSIS — R1312 Dysphagia, oropharyngeal phase: Secondary | ICD-10-CM | POA: Diagnosis not present

## 2023-01-26 DIAGNOSIS — R1312 Dysphagia, oropharyngeal phase: Secondary | ICD-10-CM | POA: Diagnosis not present

## 2023-01-27 DIAGNOSIS — R1312 Dysphagia, oropharyngeal phase: Secondary | ICD-10-CM | POA: Diagnosis not present

## 2023-01-28 DIAGNOSIS — R1312 Dysphagia, oropharyngeal phase: Secondary | ICD-10-CM | POA: Diagnosis not present

## 2023-01-30 DIAGNOSIS — R1312 Dysphagia, oropharyngeal phase: Secondary | ICD-10-CM | POA: Diagnosis not present

## 2023-01-31 DIAGNOSIS — R1312 Dysphagia, oropharyngeal phase: Secondary | ICD-10-CM | POA: Diagnosis not present

## 2023-02-02 DIAGNOSIS — R1312 Dysphagia, oropharyngeal phase: Secondary | ICD-10-CM | POA: Diagnosis not present

## 2023-02-03 DIAGNOSIS — R1312 Dysphagia, oropharyngeal phase: Secondary | ICD-10-CM | POA: Diagnosis not present

## 2023-02-04 DIAGNOSIS — R1312 Dysphagia, oropharyngeal phase: Secondary | ICD-10-CM | POA: Diagnosis not present

## 2023-02-11 DIAGNOSIS — R1312 Dysphagia, oropharyngeal phase: Secondary | ICD-10-CM | POA: Diagnosis not present

## 2023-02-12 DIAGNOSIS — R1312 Dysphagia, oropharyngeal phase: Secondary | ICD-10-CM | POA: Diagnosis not present

## 2023-02-12 DIAGNOSIS — F0390 Unspecified dementia without behavioral disturbance: Secondary | ICD-10-CM | POA: Diagnosis not present

## 2023-02-12 DIAGNOSIS — E119 Type 2 diabetes mellitus without complications: Secondary | ICD-10-CM | POA: Diagnosis not present

## 2023-02-12 DIAGNOSIS — Z961 Presence of intraocular lens: Secondary | ICD-10-CM | POA: Diagnosis not present

## 2023-02-14 DIAGNOSIS — R1312 Dysphagia, oropharyngeal phase: Secondary | ICD-10-CM | POA: Diagnosis not present

## 2023-02-16 DIAGNOSIS — R1312 Dysphagia, oropharyngeal phase: Secondary | ICD-10-CM | POA: Diagnosis not present

## 2023-02-17 DIAGNOSIS — R1312 Dysphagia, oropharyngeal phase: Secondary | ICD-10-CM | POA: Diagnosis not present

## 2023-02-18 DIAGNOSIS — R1312 Dysphagia, oropharyngeal phase: Secondary | ICD-10-CM | POA: Diagnosis not present

## 2023-02-19 DIAGNOSIS — R1312 Dysphagia, oropharyngeal phase: Secondary | ICD-10-CM | POA: Diagnosis not present

## 2023-02-20 DIAGNOSIS — R1312 Dysphagia, oropharyngeal phase: Secondary | ICD-10-CM | POA: Diagnosis not present

## 2023-02-23 DIAGNOSIS — R1312 Dysphagia, oropharyngeal phase: Secondary | ICD-10-CM | POA: Diagnosis not present

## 2023-02-24 DIAGNOSIS — R1312 Dysphagia, oropharyngeal phase: Secondary | ICD-10-CM | POA: Diagnosis not present

## 2023-02-25 DIAGNOSIS — K219 Gastro-esophageal reflux disease without esophagitis: Secondary | ICD-10-CM | POA: Diagnosis not present

## 2023-02-25 DIAGNOSIS — G3183 Dementia with Lewy bodies: Secondary | ICD-10-CM | POA: Diagnosis not present

## 2023-02-25 DIAGNOSIS — F0284 Dementia in other diseases classified elsewhere, unspecified severity, with anxiety: Secondary | ICD-10-CM | POA: Diagnosis not present

## 2023-02-25 DIAGNOSIS — F0282 Dementia in other diseases classified elsewhere, unspecified severity, with psychotic disturbance: Secondary | ICD-10-CM | POA: Diagnosis not present

## 2023-02-25 DIAGNOSIS — E785 Hyperlipidemia, unspecified: Secondary | ICD-10-CM | POA: Diagnosis not present

## 2023-02-25 DIAGNOSIS — I129 Hypertensive chronic kidney disease with stage 1 through stage 4 chronic kidney disease, or unspecified chronic kidney disease: Secondary | ICD-10-CM | POA: Diagnosis not present

## 2023-02-25 DIAGNOSIS — D631 Anemia in chronic kidney disease: Secondary | ICD-10-CM | POA: Diagnosis not present

## 2023-02-25 DIAGNOSIS — E1122 Type 2 diabetes mellitus with diabetic chronic kidney disease: Secondary | ICD-10-CM | POA: Diagnosis not present

## 2023-02-25 DIAGNOSIS — R1312 Dysphagia, oropharyngeal phase: Secondary | ICD-10-CM | POA: Diagnosis not present

## 2023-02-25 DIAGNOSIS — N1832 Chronic kidney disease, stage 3b: Secondary | ICD-10-CM | POA: Diagnosis not present

## 2023-02-26 DIAGNOSIS — Z79899 Other long term (current) drug therapy: Secondary | ICD-10-CM | POA: Diagnosis not present

## 2023-02-26 DIAGNOSIS — R1312 Dysphagia, oropharyngeal phase: Secondary | ICD-10-CM | POA: Diagnosis not present

## 2023-02-27 DIAGNOSIS — R1312 Dysphagia, oropharyngeal phase: Secondary | ICD-10-CM | POA: Diagnosis not present

## 2023-03-02 DIAGNOSIS — R1312 Dysphagia, oropharyngeal phase: Secondary | ICD-10-CM | POA: Diagnosis not present

## 2023-03-03 DIAGNOSIS — R1312 Dysphagia, oropharyngeal phase: Secondary | ICD-10-CM | POA: Diagnosis not present

## 2023-03-04 DIAGNOSIS — R1312 Dysphagia, oropharyngeal phase: Secondary | ICD-10-CM | POA: Diagnosis not present

## 2023-03-05 DIAGNOSIS — R1312 Dysphagia, oropharyngeal phase: Secondary | ICD-10-CM | POA: Diagnosis not present

## 2023-03-06 DIAGNOSIS — R1312 Dysphagia, oropharyngeal phase: Secondary | ICD-10-CM | POA: Diagnosis not present

## 2023-04-17 DIAGNOSIS — R1312 Dysphagia, oropharyngeal phase: Secondary | ICD-10-CM | POA: Diagnosis not present

## 2023-04-20 DIAGNOSIS — R1312 Dysphagia, oropharyngeal phase: Secondary | ICD-10-CM | POA: Diagnosis not present

## 2023-04-22 DIAGNOSIS — R1312 Dysphagia, oropharyngeal phase: Secondary | ICD-10-CM | POA: Diagnosis not present

## 2023-04-23 DIAGNOSIS — R1312 Dysphagia, oropharyngeal phase: Secondary | ICD-10-CM | POA: Diagnosis not present

## 2023-04-24 DIAGNOSIS — R1312 Dysphagia, oropharyngeal phase: Secondary | ICD-10-CM | POA: Diagnosis not present

## 2023-04-25 DIAGNOSIS — R1312 Dysphagia, oropharyngeal phase: Secondary | ICD-10-CM | POA: Diagnosis not present

## 2023-04-27 DIAGNOSIS — R1312 Dysphagia, oropharyngeal phase: Secondary | ICD-10-CM | POA: Diagnosis not present

## 2023-04-28 DIAGNOSIS — R1312 Dysphagia, oropharyngeal phase: Secondary | ICD-10-CM | POA: Diagnosis not present

## 2023-04-30 DIAGNOSIS — R1312 Dysphagia, oropharyngeal phase: Secondary | ICD-10-CM | POA: Diagnosis not present

## 2023-05-01 DIAGNOSIS — R1312 Dysphagia, oropharyngeal phase: Secondary | ICD-10-CM | POA: Diagnosis not present

## 2023-05-04 DIAGNOSIS — R1312 Dysphagia, oropharyngeal phase: Secondary | ICD-10-CM | POA: Diagnosis not present

## 2023-05-05 DIAGNOSIS — R1312 Dysphagia, oropharyngeal phase: Secondary | ICD-10-CM | POA: Diagnosis not present

## 2023-05-06 DIAGNOSIS — R1312 Dysphagia, oropharyngeal phase: Secondary | ICD-10-CM | POA: Diagnosis not present

## 2023-05-07 DIAGNOSIS — R1312 Dysphagia, oropharyngeal phase: Secondary | ICD-10-CM | POA: Diagnosis not present

## 2023-05-08 DIAGNOSIS — R1312 Dysphagia, oropharyngeal phase: Secondary | ICD-10-CM | POA: Diagnosis not present

## 2023-05-11 DIAGNOSIS — R1312 Dysphagia, oropharyngeal phase: Secondary | ICD-10-CM | POA: Diagnosis not present

## 2023-05-12 DIAGNOSIS — R1312 Dysphagia, oropharyngeal phase: Secondary | ICD-10-CM | POA: Diagnosis not present

## 2023-05-13 DIAGNOSIS — R1312 Dysphagia, oropharyngeal phase: Secondary | ICD-10-CM | POA: Diagnosis not present

## 2023-05-14 DIAGNOSIS — R1312 Dysphagia, oropharyngeal phase: Secondary | ICD-10-CM | POA: Diagnosis not present

## 2023-05-15 DIAGNOSIS — R1312 Dysphagia, oropharyngeal phase: Secondary | ICD-10-CM | POA: Diagnosis not present

## 2023-06-08 DIAGNOSIS — Z23 Encounter for immunization: Secondary | ICD-10-CM | POA: Diagnosis not present

## 2023-07-16 DIAGNOSIS — M6281 Muscle weakness (generalized): Secondary | ICD-10-CM | POA: Diagnosis not present

## 2023-07-16 DIAGNOSIS — Z741 Need for assistance with personal care: Secondary | ICD-10-CM | POA: Diagnosis not present

## 2023-07-16 DIAGNOSIS — R278 Other lack of coordination: Secondary | ICD-10-CM | POA: Diagnosis not present

## 2023-07-17 DIAGNOSIS — M6281 Muscle weakness (generalized): Secondary | ICD-10-CM | POA: Diagnosis not present

## 2023-07-17 DIAGNOSIS — R278 Other lack of coordination: Secondary | ICD-10-CM | POA: Diagnosis not present

## 2023-07-17 DIAGNOSIS — Z741 Need for assistance with personal care: Secondary | ICD-10-CM | POA: Diagnosis not present

## 2023-07-20 DIAGNOSIS — M6281 Muscle weakness (generalized): Secondary | ICD-10-CM | POA: Diagnosis not present

## 2023-07-20 DIAGNOSIS — R278 Other lack of coordination: Secondary | ICD-10-CM | POA: Diagnosis not present

## 2023-07-20 DIAGNOSIS — Z741 Need for assistance with personal care: Secondary | ICD-10-CM | POA: Diagnosis not present

## 2023-07-21 DIAGNOSIS — R278 Other lack of coordination: Secondary | ICD-10-CM | POA: Diagnosis not present

## 2023-07-21 DIAGNOSIS — M6281 Muscle weakness (generalized): Secondary | ICD-10-CM | POA: Diagnosis not present

## 2023-07-21 DIAGNOSIS — Z741 Need for assistance with personal care: Secondary | ICD-10-CM | POA: Diagnosis not present

## 2023-07-22 DIAGNOSIS — M6281 Muscle weakness (generalized): Secondary | ICD-10-CM | POA: Diagnosis not present

## 2023-07-22 DIAGNOSIS — R278 Other lack of coordination: Secondary | ICD-10-CM | POA: Diagnosis not present

## 2023-07-22 DIAGNOSIS — Z741 Need for assistance with personal care: Secondary | ICD-10-CM | POA: Diagnosis not present

## 2023-07-23 DIAGNOSIS — Z741 Need for assistance with personal care: Secondary | ICD-10-CM | POA: Diagnosis not present

## 2023-07-23 DIAGNOSIS — M6281 Muscle weakness (generalized): Secondary | ICD-10-CM | POA: Diagnosis not present

## 2023-07-23 DIAGNOSIS — R278 Other lack of coordination: Secondary | ICD-10-CM | POA: Diagnosis not present

## 2023-07-24 DIAGNOSIS — Z741 Need for assistance with personal care: Secondary | ICD-10-CM | POA: Diagnosis not present

## 2023-07-24 DIAGNOSIS — R278 Other lack of coordination: Secondary | ICD-10-CM | POA: Diagnosis not present

## 2023-07-24 DIAGNOSIS — M6281 Muscle weakness (generalized): Secondary | ICD-10-CM | POA: Diagnosis not present

## 2023-07-26 DIAGNOSIS — M6281 Muscle weakness (generalized): Secondary | ICD-10-CM | POA: Diagnosis not present

## 2023-07-26 DIAGNOSIS — Z741 Need for assistance with personal care: Secondary | ICD-10-CM | POA: Diagnosis not present

## 2023-07-26 DIAGNOSIS — R278 Other lack of coordination: Secondary | ICD-10-CM | POA: Diagnosis not present

## 2023-07-27 DIAGNOSIS — R278 Other lack of coordination: Secondary | ICD-10-CM | POA: Diagnosis not present

## 2023-07-27 DIAGNOSIS — Z741 Need for assistance with personal care: Secondary | ICD-10-CM | POA: Diagnosis not present

## 2023-07-27 DIAGNOSIS — M6281 Muscle weakness (generalized): Secondary | ICD-10-CM | POA: Diagnosis not present

## 2023-07-28 DIAGNOSIS — M6281 Muscle weakness (generalized): Secondary | ICD-10-CM | POA: Diagnosis not present

## 2023-07-28 DIAGNOSIS — Z741 Need for assistance with personal care: Secondary | ICD-10-CM | POA: Diagnosis not present

## 2023-07-28 DIAGNOSIS — R278 Other lack of coordination: Secondary | ICD-10-CM | POA: Diagnosis not present

## 2023-07-29 DIAGNOSIS — Z741 Need for assistance with personal care: Secondary | ICD-10-CM | POA: Diagnosis not present

## 2023-07-29 DIAGNOSIS — M6281 Muscle weakness (generalized): Secondary | ICD-10-CM | POA: Diagnosis not present

## 2023-07-29 DIAGNOSIS — R278 Other lack of coordination: Secondary | ICD-10-CM | POA: Diagnosis not present

## 2023-07-30 DIAGNOSIS — M6281 Muscle weakness (generalized): Secondary | ICD-10-CM | POA: Diagnosis not present

## 2023-07-30 DIAGNOSIS — R278 Other lack of coordination: Secondary | ICD-10-CM | POA: Diagnosis not present

## 2023-07-30 DIAGNOSIS — Z741 Need for assistance with personal care: Secondary | ICD-10-CM | POA: Diagnosis not present

## 2023-08-03 DIAGNOSIS — R278 Other lack of coordination: Secondary | ICD-10-CM | POA: Diagnosis not present

## 2023-08-03 DIAGNOSIS — Z741 Need for assistance with personal care: Secondary | ICD-10-CM | POA: Diagnosis not present

## 2023-08-03 DIAGNOSIS — M6281 Muscle weakness (generalized): Secondary | ICD-10-CM | POA: Diagnosis not present

## 2023-08-04 DIAGNOSIS — Z741 Need for assistance with personal care: Secondary | ICD-10-CM | POA: Diagnosis not present

## 2023-08-04 DIAGNOSIS — R278 Other lack of coordination: Secondary | ICD-10-CM | POA: Diagnosis not present

## 2023-08-04 DIAGNOSIS — M6281 Muscle weakness (generalized): Secondary | ICD-10-CM | POA: Diagnosis not present

## 2023-08-05 DIAGNOSIS — M6281 Muscle weakness (generalized): Secondary | ICD-10-CM | POA: Diagnosis not present

## 2023-08-05 DIAGNOSIS — Z741 Need for assistance with personal care: Secondary | ICD-10-CM | POA: Diagnosis not present

## 2023-08-05 DIAGNOSIS — R278 Other lack of coordination: Secondary | ICD-10-CM | POA: Diagnosis not present

## 2023-08-06 DIAGNOSIS — Z741 Need for assistance with personal care: Secondary | ICD-10-CM | POA: Diagnosis not present

## 2023-08-06 DIAGNOSIS — M6281 Muscle weakness (generalized): Secondary | ICD-10-CM | POA: Diagnosis not present

## 2023-08-06 DIAGNOSIS — R278 Other lack of coordination: Secondary | ICD-10-CM | POA: Diagnosis not present

## 2023-08-10 DIAGNOSIS — Z741 Need for assistance with personal care: Secondary | ICD-10-CM | POA: Diagnosis not present

## 2023-08-10 DIAGNOSIS — R278 Other lack of coordination: Secondary | ICD-10-CM | POA: Diagnosis not present

## 2023-08-10 DIAGNOSIS — M6281 Muscle weakness (generalized): Secondary | ICD-10-CM | POA: Diagnosis not present

## 2023-08-11 DIAGNOSIS — M6281 Muscle weakness (generalized): Secondary | ICD-10-CM | POA: Diagnosis not present

## 2023-08-11 DIAGNOSIS — R278 Other lack of coordination: Secondary | ICD-10-CM | POA: Diagnosis not present

## 2023-08-11 DIAGNOSIS — Z741 Need for assistance with personal care: Secondary | ICD-10-CM | POA: Diagnosis not present

## 2023-08-12 DIAGNOSIS — R278 Other lack of coordination: Secondary | ICD-10-CM | POA: Diagnosis not present

## 2023-08-12 DIAGNOSIS — M6281 Muscle weakness (generalized): Secondary | ICD-10-CM | POA: Diagnosis not present

## 2023-08-12 DIAGNOSIS — Z741 Need for assistance with personal care: Secondary | ICD-10-CM | POA: Diagnosis not present

## 2023-08-13 DIAGNOSIS — M6281 Muscle weakness (generalized): Secondary | ICD-10-CM | POA: Diagnosis not present

## 2023-08-13 DIAGNOSIS — Z741 Need for assistance with personal care: Secondary | ICD-10-CM | POA: Diagnosis not present

## 2023-08-13 DIAGNOSIS — R278 Other lack of coordination: Secondary | ICD-10-CM | POA: Diagnosis not present

## 2023-08-14 DIAGNOSIS — Z741 Need for assistance with personal care: Secondary | ICD-10-CM | POA: Diagnosis not present

## 2023-08-14 DIAGNOSIS — M6281 Muscle weakness (generalized): Secondary | ICD-10-CM | POA: Diagnosis not present

## 2023-08-14 DIAGNOSIS — R278 Other lack of coordination: Secondary | ICD-10-CM | POA: Diagnosis not present

## 2023-08-17 DIAGNOSIS — M6281 Muscle weakness (generalized): Secondary | ICD-10-CM | POA: Diagnosis not present

## 2023-08-17 DIAGNOSIS — R278 Other lack of coordination: Secondary | ICD-10-CM | POA: Diagnosis not present

## 2023-08-17 DIAGNOSIS — Z741 Need for assistance with personal care: Secondary | ICD-10-CM | POA: Diagnosis not present

## 2023-08-18 DIAGNOSIS — M6281 Muscle weakness (generalized): Secondary | ICD-10-CM | POA: Diagnosis not present

## 2023-08-18 DIAGNOSIS — R278 Other lack of coordination: Secondary | ICD-10-CM | POA: Diagnosis not present

## 2023-08-18 DIAGNOSIS — Z741 Need for assistance with personal care: Secondary | ICD-10-CM | POA: Diagnosis not present

## 2023-08-19 DIAGNOSIS — Z741 Need for assistance with personal care: Secondary | ICD-10-CM | POA: Diagnosis not present

## 2023-08-19 DIAGNOSIS — M6281 Muscle weakness (generalized): Secondary | ICD-10-CM | POA: Diagnosis not present

## 2023-08-19 DIAGNOSIS — R278 Other lack of coordination: Secondary | ICD-10-CM | POA: Diagnosis not present

## 2023-08-20 DIAGNOSIS — Z741 Need for assistance with personal care: Secondary | ICD-10-CM | POA: Diagnosis not present

## 2023-08-20 DIAGNOSIS — M6281 Muscle weakness (generalized): Secondary | ICD-10-CM | POA: Diagnosis not present

## 2023-08-20 DIAGNOSIS — R278 Other lack of coordination: Secondary | ICD-10-CM | POA: Diagnosis not present

## 2023-08-21 DIAGNOSIS — Z741 Need for assistance with personal care: Secondary | ICD-10-CM | POA: Diagnosis not present

## 2023-08-21 DIAGNOSIS — M6281 Muscle weakness (generalized): Secondary | ICD-10-CM | POA: Diagnosis not present

## 2023-08-21 DIAGNOSIS — R278 Other lack of coordination: Secondary | ICD-10-CM | POA: Diagnosis not present

## 2023-08-23 DIAGNOSIS — R278 Other lack of coordination: Secondary | ICD-10-CM | POA: Diagnosis not present

## 2023-08-23 DIAGNOSIS — Z741 Need for assistance with personal care: Secondary | ICD-10-CM | POA: Diagnosis not present

## 2023-08-23 DIAGNOSIS — M6281 Muscle weakness (generalized): Secondary | ICD-10-CM | POA: Diagnosis not present

## 2023-08-24 DIAGNOSIS — Z741 Need for assistance with personal care: Secondary | ICD-10-CM | POA: Diagnosis not present

## 2023-08-24 DIAGNOSIS — R278 Other lack of coordination: Secondary | ICD-10-CM | POA: Diagnosis not present

## 2023-08-24 DIAGNOSIS — M6281 Muscle weakness (generalized): Secondary | ICD-10-CM | POA: Diagnosis not present

## 2023-08-25 DIAGNOSIS — R278 Other lack of coordination: Secondary | ICD-10-CM | POA: Diagnosis not present

## 2023-08-25 DIAGNOSIS — Z741 Need for assistance with personal care: Secondary | ICD-10-CM | POA: Diagnosis not present

## 2023-08-25 DIAGNOSIS — M6281 Muscle weakness (generalized): Secondary | ICD-10-CM | POA: Diagnosis not present

## 2023-08-27 DIAGNOSIS — M6281 Muscle weakness (generalized): Secondary | ICD-10-CM | POA: Diagnosis not present

## 2023-08-27 DIAGNOSIS — Z741 Need for assistance with personal care: Secondary | ICD-10-CM | POA: Diagnosis not present

## 2023-08-27 DIAGNOSIS — R278 Other lack of coordination: Secondary | ICD-10-CM | POA: Diagnosis not present

## 2023-08-28 DIAGNOSIS — Z741 Need for assistance with personal care: Secondary | ICD-10-CM | POA: Diagnosis not present

## 2023-08-28 DIAGNOSIS — R278 Other lack of coordination: Secondary | ICD-10-CM | POA: Diagnosis not present

## 2023-08-28 DIAGNOSIS — M6281 Muscle weakness (generalized): Secondary | ICD-10-CM | POA: Diagnosis not present

## 2023-08-31 DIAGNOSIS — Z741 Need for assistance with personal care: Secondary | ICD-10-CM | POA: Diagnosis not present

## 2023-08-31 DIAGNOSIS — R278 Other lack of coordination: Secondary | ICD-10-CM | POA: Diagnosis not present

## 2023-08-31 DIAGNOSIS — M6281 Muscle weakness (generalized): Secondary | ICD-10-CM | POA: Diagnosis not present

## 2023-09-01 DIAGNOSIS — M6281 Muscle weakness (generalized): Secondary | ICD-10-CM | POA: Diagnosis not present

## 2023-09-01 DIAGNOSIS — Z741 Need for assistance with personal care: Secondary | ICD-10-CM | POA: Diagnosis not present

## 2023-09-01 DIAGNOSIS — R278 Other lack of coordination: Secondary | ICD-10-CM | POA: Diagnosis not present

## 2023-09-02 DIAGNOSIS — Z741 Need for assistance with personal care: Secondary | ICD-10-CM | POA: Diagnosis not present

## 2023-09-02 DIAGNOSIS — M6281 Muscle weakness (generalized): Secondary | ICD-10-CM | POA: Diagnosis not present

## 2023-09-02 DIAGNOSIS — R278 Other lack of coordination: Secondary | ICD-10-CM | POA: Diagnosis not present

## 2023-09-03 DIAGNOSIS — Z741 Need for assistance with personal care: Secondary | ICD-10-CM | POA: Diagnosis not present

## 2023-09-03 DIAGNOSIS — R278 Other lack of coordination: Secondary | ICD-10-CM | POA: Diagnosis not present

## 2023-09-03 DIAGNOSIS — M6281 Muscle weakness (generalized): Secondary | ICD-10-CM | POA: Diagnosis not present

## 2023-09-04 DIAGNOSIS — R278 Other lack of coordination: Secondary | ICD-10-CM | POA: Diagnosis not present

## 2023-09-04 DIAGNOSIS — Z741 Need for assistance with personal care: Secondary | ICD-10-CM | POA: Diagnosis not present

## 2023-09-04 DIAGNOSIS — M6281 Muscle weakness (generalized): Secondary | ICD-10-CM | POA: Diagnosis not present

## 2023-09-07 DIAGNOSIS — Z741 Need for assistance with personal care: Secondary | ICD-10-CM | POA: Diagnosis not present

## 2023-09-07 DIAGNOSIS — R278 Other lack of coordination: Secondary | ICD-10-CM | POA: Diagnosis not present

## 2023-09-07 DIAGNOSIS — M6281 Muscle weakness (generalized): Secondary | ICD-10-CM | POA: Diagnosis not present

## 2023-09-09 DIAGNOSIS — Z741 Need for assistance with personal care: Secondary | ICD-10-CM | POA: Diagnosis not present

## 2023-09-09 DIAGNOSIS — R278 Other lack of coordination: Secondary | ICD-10-CM | POA: Diagnosis not present

## 2023-09-09 DIAGNOSIS — M6281 Muscle weakness (generalized): Secondary | ICD-10-CM | POA: Diagnosis not present

## 2023-09-10 DIAGNOSIS — R278 Other lack of coordination: Secondary | ICD-10-CM | POA: Diagnosis not present

## 2023-09-10 DIAGNOSIS — M6281 Muscle weakness (generalized): Secondary | ICD-10-CM | POA: Diagnosis not present

## 2023-09-10 DIAGNOSIS — Z741 Need for assistance with personal care: Secondary | ICD-10-CM | POA: Diagnosis not present

## 2023-09-11 DIAGNOSIS — M6281 Muscle weakness (generalized): Secondary | ICD-10-CM | POA: Diagnosis not present

## 2023-09-11 DIAGNOSIS — R278 Other lack of coordination: Secondary | ICD-10-CM | POA: Diagnosis not present

## 2023-09-11 DIAGNOSIS — Z741 Need for assistance with personal care: Secondary | ICD-10-CM | POA: Diagnosis not present

## 2023-10-20 DIAGNOSIS — Z741 Need for assistance with personal care: Secondary | ICD-10-CM | POA: Diagnosis not present

## 2023-10-20 DIAGNOSIS — R278 Other lack of coordination: Secondary | ICD-10-CM | POA: Diagnosis not present

## 2023-10-20 DIAGNOSIS — R2681 Unsteadiness on feet: Secondary | ICD-10-CM | POA: Diagnosis not present

## 2023-10-20 DIAGNOSIS — M6281 Muscle weakness (generalized): Secondary | ICD-10-CM | POA: Diagnosis not present

## 2023-10-21 DIAGNOSIS — R278 Other lack of coordination: Secondary | ICD-10-CM | POA: Diagnosis not present

## 2023-10-21 DIAGNOSIS — R2681 Unsteadiness on feet: Secondary | ICD-10-CM | POA: Diagnosis not present

## 2023-10-21 DIAGNOSIS — M6281 Muscle weakness (generalized): Secondary | ICD-10-CM | POA: Diagnosis not present

## 2023-10-21 DIAGNOSIS — Z741 Need for assistance with personal care: Secondary | ICD-10-CM | POA: Diagnosis not present

## 2023-10-22 DIAGNOSIS — Z741 Need for assistance with personal care: Secondary | ICD-10-CM | POA: Diagnosis not present

## 2023-10-22 DIAGNOSIS — R278 Other lack of coordination: Secondary | ICD-10-CM | POA: Diagnosis not present

## 2023-10-22 DIAGNOSIS — R2681 Unsteadiness on feet: Secondary | ICD-10-CM | POA: Diagnosis not present

## 2023-10-22 DIAGNOSIS — M6281 Muscle weakness (generalized): Secondary | ICD-10-CM | POA: Diagnosis not present

## 2023-10-23 DIAGNOSIS — R278 Other lack of coordination: Secondary | ICD-10-CM | POA: Diagnosis not present

## 2023-10-23 DIAGNOSIS — R2681 Unsteadiness on feet: Secondary | ICD-10-CM | POA: Diagnosis not present

## 2023-10-23 DIAGNOSIS — Z741 Need for assistance with personal care: Secondary | ICD-10-CM | POA: Diagnosis not present

## 2023-10-23 DIAGNOSIS — M6281 Muscle weakness (generalized): Secondary | ICD-10-CM | POA: Diagnosis not present

## 2023-10-26 DIAGNOSIS — R278 Other lack of coordination: Secondary | ICD-10-CM | POA: Diagnosis not present

## 2023-10-26 DIAGNOSIS — R2681 Unsteadiness on feet: Secondary | ICD-10-CM | POA: Diagnosis not present

## 2023-10-26 DIAGNOSIS — Z741 Need for assistance with personal care: Secondary | ICD-10-CM | POA: Diagnosis not present

## 2023-10-26 DIAGNOSIS — M6281 Muscle weakness (generalized): Secondary | ICD-10-CM | POA: Diagnosis not present

## 2023-10-27 DIAGNOSIS — R278 Other lack of coordination: Secondary | ICD-10-CM | POA: Diagnosis not present

## 2023-10-27 DIAGNOSIS — M6281 Muscle weakness (generalized): Secondary | ICD-10-CM | POA: Diagnosis not present

## 2023-10-27 DIAGNOSIS — R2681 Unsteadiness on feet: Secondary | ICD-10-CM | POA: Diagnosis not present

## 2023-10-27 DIAGNOSIS — Z741 Need for assistance with personal care: Secondary | ICD-10-CM | POA: Diagnosis not present

## 2023-10-28 DIAGNOSIS — M6281 Muscle weakness (generalized): Secondary | ICD-10-CM | POA: Diagnosis not present

## 2023-10-28 DIAGNOSIS — R278 Other lack of coordination: Secondary | ICD-10-CM | POA: Diagnosis not present

## 2023-10-28 DIAGNOSIS — R2681 Unsteadiness on feet: Secondary | ICD-10-CM | POA: Diagnosis not present

## 2023-10-28 DIAGNOSIS — Z741 Need for assistance with personal care: Secondary | ICD-10-CM | POA: Diagnosis not present

## 2023-10-29 DIAGNOSIS — R278 Other lack of coordination: Secondary | ICD-10-CM | POA: Diagnosis not present

## 2023-10-29 DIAGNOSIS — M6281 Muscle weakness (generalized): Secondary | ICD-10-CM | POA: Diagnosis not present

## 2023-10-29 DIAGNOSIS — R2681 Unsteadiness on feet: Secondary | ICD-10-CM | POA: Diagnosis not present

## 2023-10-29 DIAGNOSIS — Z741 Need for assistance with personal care: Secondary | ICD-10-CM | POA: Diagnosis not present

## 2023-10-30 DIAGNOSIS — R278 Other lack of coordination: Secondary | ICD-10-CM | POA: Diagnosis not present

## 2023-10-30 DIAGNOSIS — R2681 Unsteadiness on feet: Secondary | ICD-10-CM | POA: Diagnosis not present

## 2023-10-30 DIAGNOSIS — M6281 Muscle weakness (generalized): Secondary | ICD-10-CM | POA: Diagnosis not present

## 2023-10-30 DIAGNOSIS — Z741 Need for assistance with personal care: Secondary | ICD-10-CM | POA: Diagnosis not present

## 2023-11-02 DIAGNOSIS — R2681 Unsteadiness on feet: Secondary | ICD-10-CM | POA: Diagnosis not present

## 2023-11-02 DIAGNOSIS — R278 Other lack of coordination: Secondary | ICD-10-CM | POA: Diagnosis not present

## 2023-11-02 DIAGNOSIS — Z741 Need for assistance with personal care: Secondary | ICD-10-CM | POA: Diagnosis not present

## 2023-11-02 DIAGNOSIS — M6281 Muscle weakness (generalized): Secondary | ICD-10-CM | POA: Diagnosis not present

## 2023-11-03 DIAGNOSIS — R2681 Unsteadiness on feet: Secondary | ICD-10-CM | POA: Diagnosis not present

## 2023-11-03 DIAGNOSIS — M6281 Muscle weakness (generalized): Secondary | ICD-10-CM | POA: Diagnosis not present

## 2023-11-03 DIAGNOSIS — R278 Other lack of coordination: Secondary | ICD-10-CM | POA: Diagnosis not present

## 2023-11-03 DIAGNOSIS — Z741 Need for assistance with personal care: Secondary | ICD-10-CM | POA: Diagnosis not present

## 2023-11-04 DIAGNOSIS — M6281 Muscle weakness (generalized): Secondary | ICD-10-CM | POA: Diagnosis not present

## 2023-11-04 DIAGNOSIS — R278 Other lack of coordination: Secondary | ICD-10-CM | POA: Diagnosis not present

## 2023-11-04 DIAGNOSIS — Z741 Need for assistance with personal care: Secondary | ICD-10-CM | POA: Diagnosis not present

## 2023-11-04 DIAGNOSIS — R2681 Unsteadiness on feet: Secondary | ICD-10-CM | POA: Diagnosis not present

## 2023-11-05 DIAGNOSIS — R278 Other lack of coordination: Secondary | ICD-10-CM | POA: Diagnosis not present

## 2023-11-05 DIAGNOSIS — M6281 Muscle weakness (generalized): Secondary | ICD-10-CM | POA: Diagnosis not present

## 2023-11-05 DIAGNOSIS — Z741 Need for assistance with personal care: Secondary | ICD-10-CM | POA: Diagnosis not present

## 2023-11-05 DIAGNOSIS — R2681 Unsteadiness on feet: Secondary | ICD-10-CM | POA: Diagnosis not present

## 2023-11-09 DIAGNOSIS — R278 Other lack of coordination: Secondary | ICD-10-CM | POA: Diagnosis not present

## 2023-11-09 DIAGNOSIS — M6281 Muscle weakness (generalized): Secondary | ICD-10-CM | POA: Diagnosis not present

## 2023-11-09 DIAGNOSIS — Z741 Need for assistance with personal care: Secondary | ICD-10-CM | POA: Diagnosis not present

## 2023-11-09 DIAGNOSIS — R2681 Unsteadiness on feet: Secondary | ICD-10-CM | POA: Diagnosis not present

## 2023-11-10 DIAGNOSIS — R2681 Unsteadiness on feet: Secondary | ICD-10-CM | POA: Diagnosis not present

## 2023-11-10 DIAGNOSIS — M6281 Muscle weakness (generalized): Secondary | ICD-10-CM | POA: Diagnosis not present

## 2023-11-10 DIAGNOSIS — R278 Other lack of coordination: Secondary | ICD-10-CM | POA: Diagnosis not present

## 2023-11-10 DIAGNOSIS — Z741 Need for assistance with personal care: Secondary | ICD-10-CM | POA: Diagnosis not present

## 2023-11-11 DIAGNOSIS — R278 Other lack of coordination: Secondary | ICD-10-CM | POA: Diagnosis not present

## 2023-11-11 DIAGNOSIS — M6281 Muscle weakness (generalized): Secondary | ICD-10-CM | POA: Diagnosis not present

## 2023-11-11 DIAGNOSIS — Z741 Need for assistance with personal care: Secondary | ICD-10-CM | POA: Diagnosis not present

## 2023-11-11 DIAGNOSIS — R2681 Unsteadiness on feet: Secondary | ICD-10-CM | POA: Diagnosis not present

## 2023-11-12 DIAGNOSIS — R2681 Unsteadiness on feet: Secondary | ICD-10-CM | POA: Diagnosis not present

## 2023-11-12 DIAGNOSIS — Z741 Need for assistance with personal care: Secondary | ICD-10-CM | POA: Diagnosis not present

## 2023-11-12 DIAGNOSIS — R278 Other lack of coordination: Secondary | ICD-10-CM | POA: Diagnosis not present

## 2023-11-12 DIAGNOSIS — M6281 Muscle weakness (generalized): Secondary | ICD-10-CM | POA: Diagnosis not present

## 2023-11-13 DIAGNOSIS — Z741 Need for assistance with personal care: Secondary | ICD-10-CM | POA: Diagnosis not present

## 2023-11-13 DIAGNOSIS — R278 Other lack of coordination: Secondary | ICD-10-CM | POA: Diagnosis not present

## 2023-11-13 DIAGNOSIS — M6281 Muscle weakness (generalized): Secondary | ICD-10-CM | POA: Diagnosis not present

## 2023-11-13 DIAGNOSIS — R2681 Unsteadiness on feet: Secondary | ICD-10-CM | POA: Diagnosis not present

## 2024-01-06 DIAGNOSIS — M6281 Muscle weakness (generalized): Secondary | ICD-10-CM | POA: Diagnosis not present

## 2024-01-06 DIAGNOSIS — R2681 Unsteadiness on feet: Secondary | ICD-10-CM | POA: Diagnosis not present

## 2024-01-07 DIAGNOSIS — M6281 Muscle weakness (generalized): Secondary | ICD-10-CM | POA: Diagnosis not present

## 2024-01-07 DIAGNOSIS — R2681 Unsteadiness on feet: Secondary | ICD-10-CM | POA: Diagnosis not present

## 2024-01-08 DIAGNOSIS — M6281 Muscle weakness (generalized): Secondary | ICD-10-CM | POA: Diagnosis not present

## 2024-01-08 DIAGNOSIS — R2681 Unsteadiness on feet: Secondary | ICD-10-CM | POA: Diagnosis not present

## 2024-01-11 DIAGNOSIS — M6281 Muscle weakness (generalized): Secondary | ICD-10-CM | POA: Diagnosis not present

## 2024-01-11 DIAGNOSIS — R2681 Unsteadiness on feet: Secondary | ICD-10-CM | POA: Diagnosis not present

## 2024-01-12 DIAGNOSIS — M6281 Muscle weakness (generalized): Secondary | ICD-10-CM | POA: Diagnosis not present

## 2024-01-12 DIAGNOSIS — R2681 Unsteadiness on feet: Secondary | ICD-10-CM | POA: Diagnosis not present

## 2024-01-13 DIAGNOSIS — R2681 Unsteadiness on feet: Secondary | ICD-10-CM | POA: Diagnosis not present

## 2024-01-13 DIAGNOSIS — M6281 Muscle weakness (generalized): Secondary | ICD-10-CM | POA: Diagnosis not present

## 2024-01-14 DIAGNOSIS — R2681 Unsteadiness on feet: Secondary | ICD-10-CM | POA: Diagnosis not present

## 2024-01-14 DIAGNOSIS — M6281 Muscle weakness (generalized): Secondary | ICD-10-CM | POA: Diagnosis not present

## 2024-01-15 DIAGNOSIS — M6281 Muscle weakness (generalized): Secondary | ICD-10-CM | POA: Diagnosis not present

## 2024-01-15 DIAGNOSIS — R2681 Unsteadiness on feet: Secondary | ICD-10-CM | POA: Diagnosis not present

## 2024-01-18 DIAGNOSIS — R2681 Unsteadiness on feet: Secondary | ICD-10-CM | POA: Diagnosis not present

## 2024-01-18 DIAGNOSIS — M6281 Muscle weakness (generalized): Secondary | ICD-10-CM | POA: Diagnosis not present

## 2024-01-19 DIAGNOSIS — R2681 Unsteadiness on feet: Secondary | ICD-10-CM | POA: Diagnosis not present

## 2024-01-19 DIAGNOSIS — M6281 Muscle weakness (generalized): Secondary | ICD-10-CM | POA: Diagnosis not present

## 2024-01-20 DIAGNOSIS — R2681 Unsteadiness on feet: Secondary | ICD-10-CM | POA: Diagnosis not present

## 2024-01-20 DIAGNOSIS — M6281 Muscle weakness (generalized): Secondary | ICD-10-CM | POA: Diagnosis not present

## 2024-01-21 DIAGNOSIS — M6281 Muscle weakness (generalized): Secondary | ICD-10-CM | POA: Diagnosis not present

## 2024-01-21 DIAGNOSIS — R2681 Unsteadiness on feet: Secondary | ICD-10-CM | POA: Diagnosis not present

## 2024-01-22 DIAGNOSIS — R2681 Unsteadiness on feet: Secondary | ICD-10-CM | POA: Diagnosis not present

## 2024-01-22 DIAGNOSIS — M6281 Muscle weakness (generalized): Secondary | ICD-10-CM | POA: Diagnosis not present

## 2024-01-25 DIAGNOSIS — M6281 Muscle weakness (generalized): Secondary | ICD-10-CM | POA: Diagnosis not present

## 2024-01-25 DIAGNOSIS — R2681 Unsteadiness on feet: Secondary | ICD-10-CM | POA: Diagnosis not present

## 2024-01-26 DIAGNOSIS — M6281 Muscle weakness (generalized): Secondary | ICD-10-CM | POA: Diagnosis not present

## 2024-01-26 DIAGNOSIS — R2681 Unsteadiness on feet: Secondary | ICD-10-CM | POA: Diagnosis not present

## 2024-01-27 DIAGNOSIS — M6281 Muscle weakness (generalized): Secondary | ICD-10-CM | POA: Diagnosis not present

## 2024-01-27 DIAGNOSIS — R2681 Unsteadiness on feet: Secondary | ICD-10-CM | POA: Diagnosis not present

## 2024-01-28 DIAGNOSIS — M6281 Muscle weakness (generalized): Secondary | ICD-10-CM | POA: Diagnosis not present

## 2024-01-28 DIAGNOSIS — R2681 Unsteadiness on feet: Secondary | ICD-10-CM | POA: Diagnosis not present

## 2024-01-29 DIAGNOSIS — R2681 Unsteadiness on feet: Secondary | ICD-10-CM | POA: Diagnosis not present

## 2024-01-29 DIAGNOSIS — M6281 Muscle weakness (generalized): Secondary | ICD-10-CM | POA: Diagnosis not present

## 2024-02-01 DIAGNOSIS — M6281 Muscle weakness (generalized): Secondary | ICD-10-CM | POA: Diagnosis not present

## 2024-02-01 DIAGNOSIS — R2681 Unsteadiness on feet: Secondary | ICD-10-CM | POA: Diagnosis not present

## 2024-02-02 DIAGNOSIS — R2681 Unsteadiness on feet: Secondary | ICD-10-CM | POA: Diagnosis not present

## 2024-02-02 DIAGNOSIS — M6281 Muscle weakness (generalized): Secondary | ICD-10-CM | POA: Diagnosis not present

## 2024-03-14 DIAGNOSIS — R1312 Dysphagia, oropharyngeal phase: Secondary | ICD-10-CM | POA: Diagnosis not present

## 2024-03-15 DIAGNOSIS — R1312 Dysphagia, oropharyngeal phase: Secondary | ICD-10-CM | POA: Diagnosis not present

## 2024-03-16 DIAGNOSIS — R1312 Dysphagia, oropharyngeal phase: Secondary | ICD-10-CM | POA: Diagnosis not present

## 2024-03-17 DIAGNOSIS — R1312 Dysphagia, oropharyngeal phase: Secondary | ICD-10-CM | POA: Diagnosis not present

## 2024-03-18 DIAGNOSIS — R1312 Dysphagia, oropharyngeal phase: Secondary | ICD-10-CM | POA: Diagnosis not present

## 2024-03-22 DIAGNOSIS — R1312 Dysphagia, oropharyngeal phase: Secondary | ICD-10-CM | POA: Diagnosis not present

## 2024-03-23 DIAGNOSIS — R1312 Dysphagia, oropharyngeal phase: Secondary | ICD-10-CM | POA: Diagnosis not present

## 2024-03-24 DIAGNOSIS — R1312 Dysphagia, oropharyngeal phase: Secondary | ICD-10-CM | POA: Diagnosis not present

## 2024-03-25 DIAGNOSIS — R1312 Dysphagia, oropharyngeal phase: Secondary | ICD-10-CM | POA: Diagnosis not present

## 2024-03-28 DIAGNOSIS — R1312 Dysphagia, oropharyngeal phase: Secondary | ICD-10-CM | POA: Diagnosis not present

## 2024-03-29 DIAGNOSIS — R1312 Dysphagia, oropharyngeal phase: Secondary | ICD-10-CM | POA: Diagnosis not present

## 2024-03-30 DIAGNOSIS — R1312 Dysphagia, oropharyngeal phase: Secondary | ICD-10-CM | POA: Diagnosis not present

## 2024-03-31 DIAGNOSIS — R1312 Dysphagia, oropharyngeal phase: Secondary | ICD-10-CM | POA: Diagnosis not present

## 2024-04-01 DIAGNOSIS — Z741 Need for assistance with personal care: Secondary | ICD-10-CM | POA: Diagnosis not present

## 2024-04-01 DIAGNOSIS — R278 Other lack of coordination: Secondary | ICD-10-CM | POA: Diagnosis not present

## 2024-04-01 DIAGNOSIS — R1312 Dysphagia, oropharyngeal phase: Secondary | ICD-10-CM | POA: Diagnosis not present

## 2024-04-01 DIAGNOSIS — M6281 Muscle weakness (generalized): Secondary | ICD-10-CM | POA: Diagnosis not present

## 2024-04-04 DIAGNOSIS — M6281 Muscle weakness (generalized): Secondary | ICD-10-CM | POA: Diagnosis not present

## 2024-04-04 DIAGNOSIS — R1312 Dysphagia, oropharyngeal phase: Secondary | ICD-10-CM | POA: Diagnosis not present

## 2024-04-04 DIAGNOSIS — Z741 Need for assistance with personal care: Secondary | ICD-10-CM | POA: Diagnosis not present

## 2024-04-04 DIAGNOSIS — R278 Other lack of coordination: Secondary | ICD-10-CM | POA: Diagnosis not present

## 2024-04-05 DIAGNOSIS — M6281 Muscle weakness (generalized): Secondary | ICD-10-CM | POA: Diagnosis not present

## 2024-04-05 DIAGNOSIS — Z741 Need for assistance with personal care: Secondary | ICD-10-CM | POA: Diagnosis not present

## 2024-04-05 DIAGNOSIS — R278 Other lack of coordination: Secondary | ICD-10-CM | POA: Diagnosis not present

## 2024-04-05 DIAGNOSIS — R1312 Dysphagia, oropharyngeal phase: Secondary | ICD-10-CM | POA: Diagnosis not present

## 2024-04-06 DIAGNOSIS — R278 Other lack of coordination: Secondary | ICD-10-CM | POA: Diagnosis not present

## 2024-04-06 DIAGNOSIS — R1312 Dysphagia, oropharyngeal phase: Secondary | ICD-10-CM | POA: Diagnosis not present

## 2024-04-06 DIAGNOSIS — Z741 Need for assistance with personal care: Secondary | ICD-10-CM | POA: Diagnosis not present

## 2024-04-06 DIAGNOSIS — M6281 Muscle weakness (generalized): Secondary | ICD-10-CM | POA: Diagnosis not present

## 2024-04-07 DIAGNOSIS — R278 Other lack of coordination: Secondary | ICD-10-CM | POA: Diagnosis not present

## 2024-04-07 DIAGNOSIS — M6281 Muscle weakness (generalized): Secondary | ICD-10-CM | POA: Diagnosis not present

## 2024-04-07 DIAGNOSIS — Z741 Need for assistance with personal care: Secondary | ICD-10-CM | POA: Diagnosis not present

## 2024-04-07 DIAGNOSIS — R1312 Dysphagia, oropharyngeal phase: Secondary | ICD-10-CM | POA: Diagnosis not present

## 2024-04-10 DIAGNOSIS — Z741 Need for assistance with personal care: Secondary | ICD-10-CM | POA: Diagnosis not present

## 2024-04-10 DIAGNOSIS — R1312 Dysphagia, oropharyngeal phase: Secondary | ICD-10-CM | POA: Diagnosis not present

## 2024-04-10 DIAGNOSIS — R278 Other lack of coordination: Secondary | ICD-10-CM | POA: Diagnosis not present

## 2024-04-10 DIAGNOSIS — M6281 Muscle weakness (generalized): Secondary | ICD-10-CM | POA: Diagnosis not present

## 2024-04-11 DIAGNOSIS — R1312 Dysphagia, oropharyngeal phase: Secondary | ICD-10-CM | POA: Diagnosis not present

## 2024-04-11 DIAGNOSIS — R278 Other lack of coordination: Secondary | ICD-10-CM | POA: Diagnosis not present

## 2024-04-11 DIAGNOSIS — M6281 Muscle weakness (generalized): Secondary | ICD-10-CM | POA: Diagnosis not present

## 2024-04-11 DIAGNOSIS — Z741 Need for assistance with personal care: Secondary | ICD-10-CM | POA: Diagnosis not present

## 2024-04-12 DIAGNOSIS — Z741 Need for assistance with personal care: Secondary | ICD-10-CM | POA: Diagnosis not present

## 2024-04-12 DIAGNOSIS — R278 Other lack of coordination: Secondary | ICD-10-CM | POA: Diagnosis not present

## 2024-04-12 DIAGNOSIS — M6281 Muscle weakness (generalized): Secondary | ICD-10-CM | POA: Diagnosis not present

## 2024-04-12 DIAGNOSIS — R1312 Dysphagia, oropharyngeal phase: Secondary | ICD-10-CM | POA: Diagnosis not present

## 2024-04-13 DIAGNOSIS — R278 Other lack of coordination: Secondary | ICD-10-CM | POA: Diagnosis not present

## 2024-04-13 DIAGNOSIS — R1312 Dysphagia, oropharyngeal phase: Secondary | ICD-10-CM | POA: Diagnosis not present

## 2024-04-13 DIAGNOSIS — M6281 Muscle weakness (generalized): Secondary | ICD-10-CM | POA: Diagnosis not present

## 2024-04-13 DIAGNOSIS — Z741 Need for assistance with personal care: Secondary | ICD-10-CM | POA: Diagnosis not present

## 2024-04-14 DIAGNOSIS — M6281 Muscle weakness (generalized): Secondary | ICD-10-CM | POA: Diagnosis not present

## 2024-04-14 DIAGNOSIS — R278 Other lack of coordination: Secondary | ICD-10-CM | POA: Diagnosis not present

## 2024-04-14 DIAGNOSIS — R1312 Dysphagia, oropharyngeal phase: Secondary | ICD-10-CM | POA: Diagnosis not present

## 2024-04-14 DIAGNOSIS — Z741 Need for assistance with personal care: Secondary | ICD-10-CM | POA: Diagnosis not present

## 2024-04-15 DIAGNOSIS — R1312 Dysphagia, oropharyngeal phase: Secondary | ICD-10-CM | POA: Diagnosis not present

## 2024-04-15 DIAGNOSIS — M6281 Muscle weakness (generalized): Secondary | ICD-10-CM | POA: Diagnosis not present

## 2024-04-15 DIAGNOSIS — R278 Other lack of coordination: Secondary | ICD-10-CM | POA: Diagnosis not present

## 2024-04-15 DIAGNOSIS — Z741 Need for assistance with personal care: Secondary | ICD-10-CM | POA: Diagnosis not present

## 2024-04-17 DIAGNOSIS — R278 Other lack of coordination: Secondary | ICD-10-CM | POA: Diagnosis not present

## 2024-04-17 DIAGNOSIS — R1312 Dysphagia, oropharyngeal phase: Secondary | ICD-10-CM | POA: Diagnosis not present

## 2024-04-17 DIAGNOSIS — Z741 Need for assistance with personal care: Secondary | ICD-10-CM | POA: Diagnosis not present

## 2024-04-17 DIAGNOSIS — M6281 Muscle weakness (generalized): Secondary | ICD-10-CM | POA: Diagnosis not present

## 2024-04-19 DIAGNOSIS — Z741 Need for assistance with personal care: Secondary | ICD-10-CM | POA: Diagnosis not present

## 2024-04-19 DIAGNOSIS — R1312 Dysphagia, oropharyngeal phase: Secondary | ICD-10-CM | POA: Diagnosis not present

## 2024-04-19 DIAGNOSIS — M6281 Muscle weakness (generalized): Secondary | ICD-10-CM | POA: Diagnosis not present

## 2024-04-19 DIAGNOSIS — R278 Other lack of coordination: Secondary | ICD-10-CM | POA: Diagnosis not present

## 2024-04-20 DIAGNOSIS — R278 Other lack of coordination: Secondary | ICD-10-CM | POA: Diagnosis not present

## 2024-04-20 DIAGNOSIS — R1312 Dysphagia, oropharyngeal phase: Secondary | ICD-10-CM | POA: Diagnosis not present

## 2024-04-20 DIAGNOSIS — Z741 Need for assistance with personal care: Secondary | ICD-10-CM | POA: Diagnosis not present

## 2024-04-20 DIAGNOSIS — M6281 Muscle weakness (generalized): Secondary | ICD-10-CM | POA: Diagnosis not present

## 2024-04-21 DIAGNOSIS — Z741 Need for assistance with personal care: Secondary | ICD-10-CM | POA: Diagnosis not present

## 2024-04-21 DIAGNOSIS — R278 Other lack of coordination: Secondary | ICD-10-CM | POA: Diagnosis not present

## 2024-04-21 DIAGNOSIS — M6281 Muscle weakness (generalized): Secondary | ICD-10-CM | POA: Diagnosis not present

## 2024-04-21 DIAGNOSIS — R1312 Dysphagia, oropharyngeal phase: Secondary | ICD-10-CM | POA: Diagnosis not present

## 2024-04-22 DIAGNOSIS — Z741 Need for assistance with personal care: Secondary | ICD-10-CM | POA: Diagnosis not present

## 2024-04-22 DIAGNOSIS — R1312 Dysphagia, oropharyngeal phase: Secondary | ICD-10-CM | POA: Diagnosis not present

## 2024-04-22 DIAGNOSIS — M6281 Muscle weakness (generalized): Secondary | ICD-10-CM | POA: Diagnosis not present

## 2024-04-22 DIAGNOSIS — R278 Other lack of coordination: Secondary | ICD-10-CM | POA: Diagnosis not present

## 2024-04-25 DIAGNOSIS — R278 Other lack of coordination: Secondary | ICD-10-CM | POA: Diagnosis not present

## 2024-04-25 DIAGNOSIS — M6281 Muscle weakness (generalized): Secondary | ICD-10-CM | POA: Diagnosis not present

## 2024-04-25 DIAGNOSIS — Z741 Need for assistance with personal care: Secondary | ICD-10-CM | POA: Diagnosis not present

## 2024-04-25 DIAGNOSIS — R1312 Dysphagia, oropharyngeal phase: Secondary | ICD-10-CM | POA: Diagnosis not present

## 2024-04-26 DIAGNOSIS — R1312 Dysphagia, oropharyngeal phase: Secondary | ICD-10-CM | POA: Diagnosis not present

## 2024-04-26 DIAGNOSIS — M6281 Muscle weakness (generalized): Secondary | ICD-10-CM | POA: Diagnosis not present

## 2024-04-26 DIAGNOSIS — Z741 Need for assistance with personal care: Secondary | ICD-10-CM | POA: Diagnosis not present

## 2024-04-26 DIAGNOSIS — R278 Other lack of coordination: Secondary | ICD-10-CM | POA: Diagnosis not present

## 2024-04-27 DIAGNOSIS — Z741 Need for assistance with personal care: Secondary | ICD-10-CM | POA: Diagnosis not present

## 2024-04-27 DIAGNOSIS — M6281 Muscle weakness (generalized): Secondary | ICD-10-CM | POA: Diagnosis not present

## 2024-04-27 DIAGNOSIS — R1312 Dysphagia, oropharyngeal phase: Secondary | ICD-10-CM | POA: Diagnosis not present

## 2024-04-27 DIAGNOSIS — R278 Other lack of coordination: Secondary | ICD-10-CM | POA: Diagnosis not present

## 2024-04-28 DIAGNOSIS — R1312 Dysphagia, oropharyngeal phase: Secondary | ICD-10-CM | POA: Diagnosis not present

## 2024-04-28 DIAGNOSIS — R278 Other lack of coordination: Secondary | ICD-10-CM | POA: Diagnosis not present

## 2024-04-28 DIAGNOSIS — M6281 Muscle weakness (generalized): Secondary | ICD-10-CM | POA: Diagnosis not present

## 2024-04-28 DIAGNOSIS — Z741 Need for assistance with personal care: Secondary | ICD-10-CM | POA: Diagnosis not present

## 2024-05-02 DIAGNOSIS — Z741 Need for assistance with personal care: Secondary | ICD-10-CM | POA: Diagnosis not present

## 2024-05-02 DIAGNOSIS — M6281 Muscle weakness (generalized): Secondary | ICD-10-CM | POA: Diagnosis not present

## 2024-05-02 DIAGNOSIS — R278 Other lack of coordination: Secondary | ICD-10-CM | POA: Diagnosis not present

## 2024-05-03 DIAGNOSIS — M6281 Muscle weakness (generalized): Secondary | ICD-10-CM | POA: Diagnosis not present

## 2024-05-03 DIAGNOSIS — Z741 Need for assistance with personal care: Secondary | ICD-10-CM | POA: Diagnosis not present

## 2024-05-03 DIAGNOSIS — R278 Other lack of coordination: Secondary | ICD-10-CM | POA: Diagnosis not present

## 2024-05-04 DIAGNOSIS — R278 Other lack of coordination: Secondary | ICD-10-CM | POA: Diagnosis not present

## 2024-05-04 DIAGNOSIS — Z741 Need for assistance with personal care: Secondary | ICD-10-CM | POA: Diagnosis not present

## 2024-05-04 DIAGNOSIS — M6281 Muscle weakness (generalized): Secondary | ICD-10-CM | POA: Diagnosis not present

## 2024-05-05 DIAGNOSIS — R278 Other lack of coordination: Secondary | ICD-10-CM | POA: Diagnosis not present

## 2024-05-05 DIAGNOSIS — Z741 Need for assistance with personal care: Secondary | ICD-10-CM | POA: Diagnosis not present

## 2024-05-05 DIAGNOSIS — M6281 Muscle weakness (generalized): Secondary | ICD-10-CM | POA: Diagnosis not present

## 2024-05-06 DIAGNOSIS — R278 Other lack of coordination: Secondary | ICD-10-CM | POA: Diagnosis not present

## 2024-05-06 DIAGNOSIS — Z741 Need for assistance with personal care: Secondary | ICD-10-CM | POA: Diagnosis not present

## 2024-05-06 DIAGNOSIS — M6281 Muscle weakness (generalized): Secondary | ICD-10-CM | POA: Diagnosis not present

## 2024-05-09 DIAGNOSIS — Z741 Need for assistance with personal care: Secondary | ICD-10-CM | POA: Diagnosis not present

## 2024-05-09 DIAGNOSIS — R278 Other lack of coordination: Secondary | ICD-10-CM | POA: Diagnosis not present

## 2024-05-09 DIAGNOSIS — M6281 Muscle weakness (generalized): Secondary | ICD-10-CM | POA: Diagnosis not present

## 2024-05-10 DIAGNOSIS — M6281 Muscle weakness (generalized): Secondary | ICD-10-CM | POA: Diagnosis not present

## 2024-05-10 DIAGNOSIS — R278 Other lack of coordination: Secondary | ICD-10-CM | POA: Diagnosis not present

## 2024-05-10 DIAGNOSIS — Z741 Need for assistance with personal care: Secondary | ICD-10-CM | POA: Diagnosis not present

## 2024-05-11 DIAGNOSIS — Z741 Need for assistance with personal care: Secondary | ICD-10-CM | POA: Diagnosis not present

## 2024-05-11 DIAGNOSIS — R278 Other lack of coordination: Secondary | ICD-10-CM | POA: Diagnosis not present

## 2024-05-11 DIAGNOSIS — M6281 Muscle weakness (generalized): Secondary | ICD-10-CM | POA: Diagnosis not present

## 2024-05-12 DIAGNOSIS — M6281 Muscle weakness (generalized): Secondary | ICD-10-CM | POA: Diagnosis not present

## 2024-05-12 DIAGNOSIS — R278 Other lack of coordination: Secondary | ICD-10-CM | POA: Diagnosis not present

## 2024-05-12 DIAGNOSIS — Z741 Need for assistance with personal care: Secondary | ICD-10-CM | POA: Diagnosis not present

## 2024-06-30 DIAGNOSIS — R2681 Unsteadiness on feet: Secondary | ICD-10-CM | POA: Diagnosis not present

## 2024-07-01 DIAGNOSIS — M6281 Muscle weakness (generalized): Secondary | ICD-10-CM | POA: Diagnosis not present

## 2024-07-01 DIAGNOSIS — R2681 Unsteadiness on feet: Secondary | ICD-10-CM | POA: Diagnosis not present
